# Patient Record
Sex: Female | Born: 1985 | Race: White | Hispanic: No | Marital: Married | Smoking: Current some day smoker
Health system: Southern US, Community
[De-identification: ages and names within clinical notes are randomized; demographics above are authoritative.]

## PROBLEM LIST (undated history)

## (undated) DIAGNOSIS — E063 Autoimmune thyroiditis: Secondary | ICD-10-CM

## (undated) DIAGNOSIS — M545 Low back pain, unspecified: Secondary | ICD-10-CM

## (undated) DIAGNOSIS — C539 Malignant neoplasm of cervix uteri, unspecified: Secondary | ICD-10-CM

## (undated) DIAGNOSIS — E039 Hypothyroidism, unspecified: Secondary | ICD-10-CM

## (undated) DIAGNOSIS — Z8541 Personal history of malignant neoplasm of cervix uteri: Secondary | ICD-10-CM

## (undated) DIAGNOSIS — F419 Anxiety disorder, unspecified: Secondary | ICD-10-CM

## (undated) DIAGNOSIS — R1115 Cyclical vomiting syndrome unrelated to migraine: Secondary | ICD-10-CM

## (undated) DIAGNOSIS — H9192 Unspecified hearing loss, left ear: Secondary | ICD-10-CM

## (undated) DIAGNOSIS — C801 Malignant (primary) neoplasm, unspecified: Secondary | ICD-10-CM

## (undated) HISTORY — DX: Personal history of malignant neoplasm of cervix uteri: Z85.41

## (undated) HISTORY — DX: Anxiety disorder, unspecified: F41.9

## (undated) HISTORY — DX: Autoimmune thyroiditis: E06.3

## (undated) HISTORY — PX: BUNIONECTOMY: SHX129

## (undated) HISTORY — DX: Hypothyroidism, unspecified: E03.9

## (undated) HISTORY — PX: TONSILLECTOMY: SUR1361

## (undated) HISTORY — DX: Low back pain, unspecified: M54.50

## (undated) HISTORY — DX: Unspecified hearing loss, left ear: H91.92

---
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
## 2007-10-15 ENCOUNTER — Emergency Department: Payer: Self-pay | Admitting: Emergency Medicine

## 2009-05-03 ENCOUNTER — Ambulatory Visit: Payer: Self-pay | Admitting: Family Medicine

## 2010-04-16 LAB — CBC AND DIFFERENTIAL
HEMATOCRIT: 41 % (ref 36–46)
Hemoglobin: 14.1 g/dL (ref 12.0–16.0)
NEUTROS ABS: 4 /uL
Platelets: 405 10*3/uL — AB (ref 150–399)
WBC: 9.2 10^3/mL

## 2010-04-16 LAB — TSH: TSH: 3.78 u[IU]/mL (ref 0.41–5.90)

## 2010-04-16 LAB — HEPATIC FUNCTION PANEL
ALT: 25 U/L (ref 7–35)
AST: 22 U/L (ref 13–35)
Alkaline Phosphatase: 76 U/L (ref 25–125)
Bilirubin, Total: 0.3 mg/dL

## 2010-04-16 LAB — BASIC METABOLIC PANEL
BUN: 12 mg/dL (ref 4–21)
Creatinine: 0.6 mg/dL (ref 0.5–1.1)
Glucose: 84 mg/dL
Potassium: 4.1 mmol/L (ref 3.4–5.3)
SODIUM: 137 mmol/L (ref 137–147)

## 2010-04-16 LAB — LIPID PANEL
Cholesterol: 200 mg/dL (ref 0–200)
HDL: 56 mg/dL (ref 35–70)
LDL Cholesterol: 110 mg/dL
LDl/HDL Ratio: 2
Triglycerides: 172 mg/dL — AB (ref 40–160)

## 2010-07-02 ENCOUNTER — Inpatient Hospital Stay: Payer: Self-pay | Admitting: Psychiatry

## 2010-11-05 ENCOUNTER — Ambulatory Visit: Payer: Self-pay | Admitting: Otolaryngology

## 2011-06-25 ENCOUNTER — Emergency Department: Payer: Self-pay | Admitting: Emergency Medicine

## 2011-06-25 LAB — URINALYSIS, COMPLETE
Bilirubin,UR: NEGATIVE
Glucose,UR: NEGATIVE mg/dL (ref 0–75)
Nitrite: NEGATIVE
Ph: 5 (ref 4.5–8.0)
Protein: 100
RBC,UR: 14 /HPF (ref 0–5)
Specific Gravity: 1.031 (ref 1.003–1.030)
Squamous Epithelial: 210

## 2011-06-25 LAB — CBC
HCT: 38.3 % (ref 35.0–47.0)
MCH: 32.4 pg (ref 26.0–34.0)
MCHC: 35 g/dL (ref 32.0–36.0)
MCV: 93 fL (ref 80–100)
Platelet: 332 10*3/uL (ref 150–440)
RBC: 4.14 10*6/uL (ref 3.80–5.20)
RDW: 12.4 % (ref 11.5–14.5)

## 2011-06-25 LAB — COMPREHENSIVE METABOLIC PANEL
Albumin: 3.2 g/dL — ABNORMAL LOW (ref 3.4–5.0)
Anion Gap: 11 (ref 7–16)
BUN: 5 mg/dL — ABNORMAL LOW (ref 7–18)
Bilirubin,Total: 0.3 mg/dL (ref 0.2–1.0)
Calcium, Total: 8.5 mg/dL (ref 8.5–10.1)
Chloride: 104 mmol/L (ref 98–107)
Co2: 21 mmol/L (ref 21–32)
EGFR (African American): >60
EGFR (Non-African Amer.): >60
Glucose: 87 mg/dL (ref 65–99)
Osmolality: 269 (ref 275–301)
Potassium: 3.6 mmol/L (ref 3.5–5.1)
SGOT(AST): 20 U/L (ref 15–37)
SGPT (ALT): 16 U/L
Sodium: 136 mmol/L (ref 136–145)
Total Protein: 7.4 g/dL (ref 6.4–8.2)

## 2011-06-25 LAB — ETHANOL: Ethanol %: <0.003 % (ref 0.000–0.080)

## 2011-06-25 LAB — TSH: Thyroid Stimulating Horm: 2.86 u[IU]/mL

## 2011-06-25 LAB — ACETAMINOPHEN LEVEL: Acetaminophen: <2 ug/mL

## 2011-06-25 LAB — DRUG SCREEN, URINE
Barbiturates, Ur Screen: NEGATIVE (ref ?–200)
Benzodiazepine, Ur Scrn: NEGATIVE (ref ?–200)
Cannabinoid 50 Ng, Ur ~~LOC~~: NEGATIVE (ref ?–50)
MDMA (Ecstasy)Ur Screen: NEGATIVE (ref ?–500)
Methadone, Ur Screen: NEGATIVE (ref ?–300)

## 2011-06-25 LAB — SALICYLATE LEVEL: Salicylates, Serum: 2.3 mg/dL

## 2011-09-07 ENCOUNTER — Inpatient Hospital Stay: Payer: Self-pay | Admitting: Obstetrics & Gynecology

## 2011-09-07 LAB — URINALYSIS, COMPLETE
Bacteria: NONE SEEN
Glucose,UR: NEGATIVE mg/dL (ref 0–75)
Nitrite: NEGATIVE
Ph: 6 (ref 4.5–8.0)
Protein: >=500
RBC,UR: 2 /HPF (ref 0–5)
Specific Gravity: 1.014 (ref 1.003–1.030)
Squamous Epithelial: 5

## 2011-09-07 LAB — COMPREHENSIVE METABOLIC PANEL
BUN: 10 mg/dL (ref 7–18)
Calcium, Total: 8.3 mg/dL — ABNORMAL LOW (ref 8.5–10.1)
Chloride: 108 mmol/L — ABNORMAL HIGH (ref 98–107)
Co2: 22 mmol/L (ref 21–32)
Creatinine: 0.66 mg/dL (ref 0.60–1.30)
EGFR (Non-African Amer.): >60
Glucose: 78 mg/dL (ref 65–99)
SGPT (ALT): 13 U/L
Sodium: 140 mmol/L (ref 136–145)
Total Protein: 6.4 g/dL (ref 6.4–8.2)

## 2011-09-07 LAB — DRUG SCREEN, URINE
Amphetamines, Ur Screen: NEGATIVE (ref ?–1000)
MDMA (Ecstasy)Ur Screen: NEGATIVE (ref ?–500)
Methadone, Ur Screen: NEGATIVE (ref ?–300)
Opiate, Ur Screen: NEGATIVE (ref ?–300)
Phencyclidine (PCP) Ur S: NEGATIVE (ref ?–25)

## 2011-09-07 LAB — CBC
HCT: 36.8 % (ref 35.0–47.0)
HGB: 12.6 g/dL (ref 12.0–16.0)
MCH: 31.5 pg (ref 26.0–34.0)
MCHC: 34.2 g/dL (ref 32.0–36.0)
MCV: 92 fL (ref 80–100)
Platelet: 199 10*3/uL (ref 150–440)
WBC: 11.3 10*3/uL — ABNORMAL HIGH (ref 3.6–11.0)

## 2011-09-07 LAB — HCG, QUANTITATIVE, PREGNANCY: Beta Hcg, Quant.: 7071 m[IU]/mL — ABNORMAL HIGH

## 2011-09-09 LAB — CBC
HCT: 32.9 % — ABNORMAL LOW (ref 35.0–47.0)
MCH: 32.5 pg (ref 26.0–34.0)
MCV: 94 fL (ref 80–100)
RBC: 3.49 10*6/uL — ABNORMAL LOW (ref 3.80–5.20)
RDW: 13.2 % (ref 11.5–14.5)
WBC: 8.4 10*3/uL (ref 3.6–11.0)

## 2011-09-09 LAB — HEMATOCRIT: HCT: 33 % — ABNORMAL LOW (ref 35.0–47.0)

## 2011-09-10 LAB — PATHOLOGY REPORT

## 2011-09-11 ENCOUNTER — Emergency Department: Payer: Self-pay | Admitting: Unknown Physician Specialty

## 2011-09-11 LAB — COMPREHENSIVE METABOLIC PANEL
Albumin: 2.8 g/dL — ABNORMAL LOW (ref 3.4–5.0)
Alkaline Phosphatase: 101 U/L (ref 50–136)
Anion Gap: 11 (ref 7–16)
BUN: 10 mg/dL (ref 7–18)
Bilirubin,Total: 0.3 mg/dL (ref 0.2–1.0)
Calcium, Total: 9.3 mg/dL (ref 8.5–10.1)
Chloride: 109 mmol/L — ABNORMAL HIGH (ref 98–107)
Creatinine: 0.67 mg/dL (ref 0.60–1.30)
EGFR (African American): >60
EGFR (Non-African Amer.): >60
Osmolality: 281 (ref 275–301)
Potassium: 4.2 mmol/L (ref 3.5–5.1)
SGPT (ALT): 20 U/L
Sodium: 142 mmol/L (ref 136–145)
Total Protein: 6.9 g/dL (ref 6.4–8.2)

## 2011-09-11 LAB — URINALYSIS, COMPLETE
Bacteria: NONE SEEN
Blood: NEGATIVE
Glucose,UR: NEGATIVE mg/dL (ref 0–75)
Ketone: NEGATIVE
Leukocyte Esterase: NEGATIVE
Nitrite: NEGATIVE
RBC,UR: 1 /HPF (ref 0–5)
Specific Gravity: 1.006 (ref 1.003–1.030)

## 2011-09-11 LAB — CBC
HCT: 34.9 % — ABNORMAL LOW (ref 35.0–47.0)
MCH: 31.9 pg (ref 26.0–34.0)
MCHC: 34.3 g/dL (ref 32.0–36.0)
MCV: 93 fL (ref 80–100)
Platelet: 265 10*3/uL (ref 150–440)
RBC: 3.76 10*6/uL — ABNORMAL LOW (ref 3.80–5.20)
RDW: 13.2 % (ref 11.5–14.5)
WBC: 9.2 10*3/uL (ref 3.6–11.0)

## 2012-03-25 ENCOUNTER — Ambulatory Visit: Payer: Self-pay | Admitting: Family Medicine

## 2012-08-03 ENCOUNTER — Ambulatory Visit: Payer: Self-pay | Admitting: Podiatry

## 2012-08-10 ENCOUNTER — Ambulatory Visit: Payer: Self-pay | Admitting: Podiatry

## 2014-07-29 NOTE — Consult Note (Signed)
PATIENT NAME:  Kristy Gordon, COBB MR#:  [id number] DATE OF BIRTH:  05-17-1985  DATE OF CONSULTATION:  06/26/2011  REFERRING PHYSICIAN:   CONSULTING PHYSICIAN:  Gonzella Lex, MD  IDENTIFYING INFORMATION AND REASON FOR CONSULT: This is a 29 year old woman who was brought to the Emergency Room under involuntary commitment because of a suicidal threat. The consult was to evaluate dangerousness and appropriate treatment.   CHIEF COMPLAINT: "This is all a big mistake. I was just upset."   HISTORY OF PRESENT ILLNESS:  She admits that yesterday she was having an argument with her boyfriend via text messages. She says that her boyfriend made a statement that she should just ignore him and forget that he exists. She responded to that by making a statement that he did not need to worry about her and suggesting that she might be planning to kill herself. Boyfriend took out paperwork, I believe, or else called the mother who did so. The patient reports that recent symptoms have been increased moodiness and emotional lability. This has been present since she has been pregnant for the last couple of months.  It gets worse, of course, when she is having arguments with her boyfriend. She denies that she has had any suicidal thoughts recently. She denies that she was actually having any thought of doing anything to hurt herself yesterday. She has been off psychiatric medicines since learning that she was pregnant. Otherwise, she says that recently she has not been feeling particularly depressed or down. Denies suicidal ideation. Says she sleeps pretty well even off of medicine. Energy level is pretty good. She has continued to work and take care of herself. She feels optimistic and is looking forward to having this child. Feels like it very much worth living for her. Has plans for the future. Not having a lot of negative thinking. Denies any psychotic symptoms. She is not currently using any substances of abuse. She has not  been seeing her outpatient therapist recently because of finances.   PAST PSYCHIATRIC HISTORY:   One previous hospitalization at our facility about a year ago. At that time she had taken an overdose of an antidepressant. Even at that time she was claiming that she was just trying to sleep. It was not an actually dangerous overdose. At that time she was diagnosed with some degree of chronic depression and PTSD. She was treated with low dose of Seroquel at that time. She says that she continued that for a while, but later was switched to trazodone for sleep. She also had been seeing a therapist in the community over the last year several times and had been referred to someone for specific PTSD treatment. She has seen them a couple of times but recently has not followed up again because of finances. She has no other history of suicide attempts. She does have a history of drinking in the past but has stopped drinking, especially since learning that she is pregnant. No other history of suicide attempts. No history of psychotic symptoms.   SOCIAL HISTORY: The patient lives with her parents. She works Theme park manager.  She said she really likes her job. She is three months pregnant. Never been pregnant before and never had any children before. Has a boyfriend with whom she says she usually has a pretty good relationship but they do often get in arguments because he lives so far away from her.   SUBSTANCE ABUSE HISTORY: Some history of drinking in the past, no clear substance abuse  diagnosis. Currently not drinking or using any drugs.   MEDICAL HISTORY: Currently three months pregnant. Has seen an OB/GYN at [facility] obstetrics. Says that she thinks the pregnancy is going pretty well but she has been more moody since getting pregnant.   REVIEW OF SYSTEMS: Complains of increased moodiness. Denies sleep problems, denies appetite problems. Currently denies any suicidal ideation. Denies any psychotic symptoms.  Endorses some mild fatigue, but not enough to restrict her activities. Sleeping pretty well.  No new constitutional complaints. No other specific medical complaints.   MENTAL STATUS EXAM: The patient was interviewed in the Emergency Room. She was awake and cooperative during the interview. Eye contact was good. Psychomotor activity normal. Speech normal rate, tone, and volume. Affect was reactive, appropriate, and upbeat. Mood was stated as being fine. Thoughts are lucid and directed with no evidence of loosening of associations or delusional thinking. No sign of hostility or paranoid thinking. Denies suicidal or homicidal ideation. The patient is alert and oriented and appears to be grossly cognitively intact. Judgment and insight are adequate.   ASSESSMENT: This is a 29 year old woman who made a suicidal threat but did not actually try to harm herself. It was in the middle of an argument. She is not describing currently having a major depression. She does describe being more moody since being pregnant. She does have a past history of mood swings, irritability, and anxiety with a diagnosis of PTSD. She is showing good insight. She is able to state multiple things that are positive in her life that she wants to live for. Not feeling hopeless or overwhelmed. At this point I do not think that the patient is acutely dangerous to herself. I do think that it would be appropriate for her to continue to follow up with her therapist and I have advised her to do so. I also think it would be a good idea for her to talk with her OB/GYN  as a good first contact about her moodiness.   TREATMENT PLAN: I will discontinue the involuntary commitment paperwork. Discussed the case with the Emergency Room doctor and recommend the patient be discharged from the Emergency Room. I attempted to call the family and there was no reply but there was voicemail. I left them a message stating that she was being released from the Emergency  Room. I have educated the patient about depression symptoms and the importance of getting follow-up treatment. Educated her about the importance of immediately getting help if suicidal ideation were to arise. I suggested that she follow up with her therapist and with her OB/GYN. She is completely agreeable to all of this. Not starting any medication although we have discussed the potential that that might be useful in the future.   DIAGNOSIS PRINCIPLE AND PRIMARY:  AXIS I: Adjustment disorder with mood symptoms.   SECONDARY DIAGNOSES:  AXIS I: Posttraumatic stress disorder.   AXIS II: No diagnosis.   AXIS III: Intrauterine pregnancy, three months, normal.   AXIS IV: Stress from pregnancy, being unmarried, having to continue working.   AXIS V: Functioning at time of evaluation: 28.   ____________________________ Gonzella Lex, MD jtc:bjt D: 06/26/2011 10:25:25 ET T: 06/26/2011 10:43:05 ET JOB#: [id number]  cc: Gonzella Lex, MD, <Dictator> Gonzella Lex MD ELECTRONICALLY SIGNED 06/26/2011 17:51

## 2014-07-29 NOTE — Consult Note (Signed)
Brief Consult Note: Diagnosis: adjustment disorder.   Patient was seen by consultant.   Consult note dictated.   Discussed with Attending MD.   Comments: Psychiatry: Patient seen. Patient made a suicidal statement yesterday in a text to her BF. They were in an arguement. Pt didnot actually do anything to harm self. Today calm and appropriate. Denies any SI. Denies MDE symptoms. Is pregnant and has been more emotional since being pregnant. She is not acutely dangerous and is able to discuss an appropriate plan for outpt followup. Will cancel IVC. Discussed with ER MD. Advise pt may be dischargeed from ER.  Electronic Signatures: Itali Mckendry, Madie Reno (MD)  (Signed 22-Mar-13 10:16)  Authored: Brief Consult Note   Last Updated: 22-Mar-13 10:16 by Gonzella Lex (MD)

## 2014-08-14 NOTE — H&P (Signed)
L&D Evaluation:  History:   HPI 29 year old G2P0100 presents to ER this AM with c/o spotting, cramping, and having high blood pressure over the weekend (her sister is a Marine scientist and took her BP). She has not had PNC so far this pregnancy, she was a pt of [facility] in the past but not recently. She claims that her approx LMP was 04/16/11 which gives her a EDD of 01/21/12 and would make her 20 weeks 4 days. Pt states she has had irreg cycles and only found out she was pregnant a few weeks ago by home UPT. Ob hx of one previous SVD in 2008 of a stillborn at "7 months" per pt. She said she was cramping, started to feel pressure and went to hospital and "baby came fast" and at that time was found to have preeclampsia.  Her BP was treated in ER and she was brought to mother baby for BP control (we were originally told she was 18 weeks per ER), when unable to get FHT's per doppler a bedside U/S was done per Dr. Cristino Martes and there was no fetal cardiac activity. Pt was appropriately upset with this news.- She states "I felt like something was wrong" She has been having severe headaches for several days, the spotting started yesterday and continued throught this morning, the cramping has been for the last couple days. Dr. Cristino Martes measured femur length and estimated the fetus to be between 24-25 weeks.    Presents with abdominal pain, vaginal bleeding, high blood pressure    Patient's Medical History Hypertension  preeclampsia with previous pregnancy    Patient's Surgical History none    Medications Pre Natal Vitamins    Allergies PCN    Social History drugs  previous MJ and alcohol use- states 5 years ago    Family History Non-Contributory   ROS:   ROS see HPI   Exam:   Vital Signs BP >140/90    Urine Protein + protein per ER dipstick    General no apparent distress    Mental Status clear  appropriately saddened    Chest clear    Abdomen gravid, non-tender    Back no CVAT    Edema no edema     Reflexes 2+    Clonus negative    Pelvic fingertip/thick    Mebranes Intact    FHT no FHR activity    Ucx "cramping"    Skin dry   Impression:   Impression IUFD at approx 24-25 weeks, chronic HTN   Plan:   Plan Pitocin for induction, IV latetolol for BP's > 160/90, UDS, Prenatal labs   Electronic Signatures: Shann Medal (CNM)  (Signed 03-Jun-13 16:48)  Authored: L&D Evaluation   Last Updated: 03-Jun-13 16:48 by Shann Medal (CNM)

## 2015-09-05 DIAGNOSIS — F329 Major depressive disorder, single episode, unspecified: Secondary | ICD-10-CM | POA: Insufficient documentation

## 2015-09-05 DIAGNOSIS — F32A Depression, unspecified: Secondary | ICD-10-CM | POA: Insufficient documentation

## 2015-09-05 DIAGNOSIS — G4700 Insomnia, unspecified: Secondary | ICD-10-CM | POA: Insufficient documentation

## 2015-09-05 DIAGNOSIS — F419 Anxiety disorder, unspecified: Secondary | ICD-10-CM | POA: Insufficient documentation

## 2015-09-05 DIAGNOSIS — E079 Disorder of thyroid, unspecified: Secondary | ICD-10-CM | POA: Insufficient documentation

## 2015-09-05 DIAGNOSIS — Z720 Tobacco use: Secondary | ICD-10-CM

## 2015-09-05 DIAGNOSIS — E669 Obesity, unspecified: Secondary | ICD-10-CM | POA: Insufficient documentation

## 2015-09-05 DIAGNOSIS — E010 Iodine-deficiency related diffuse (endemic) goiter: Secondary | ICD-10-CM | POA: Insufficient documentation

## 2015-09-05 DIAGNOSIS — F1111 Opioid abuse, in remission: Secondary | ICD-10-CM | POA: Insufficient documentation

## 2015-09-05 DIAGNOSIS — F17200 Nicotine dependence, unspecified, uncomplicated: Secondary | ICD-10-CM | POA: Insufficient documentation

## 2015-09-09 ENCOUNTER — Ambulatory Visit (INDEPENDENT_AMBULATORY_CARE_PROVIDER_SITE_OTHER): Payer: Self-pay | Admitting: Family Medicine

## 2015-09-09 ENCOUNTER — Encounter: Payer: Self-pay | Admitting: Family Medicine

## 2015-09-09 VITALS — BP 138/90 | HR 82 | Temp 98.0°F | Resp 16 | Wt 282.0 lb

## 2015-09-09 DIAGNOSIS — E049 Nontoxic goiter, unspecified: Secondary | ICD-10-CM

## 2015-09-09 DIAGNOSIS — Z8639 Personal history of other endocrine, nutritional and metabolic disease: Secondary | ICD-10-CM

## 2015-09-09 DIAGNOSIS — E039 Hypothyroidism, unspecified: Secondary | ICD-10-CM

## 2015-09-09 DIAGNOSIS — E669 Obesity, unspecified: Secondary | ICD-10-CM

## 2015-09-09 DIAGNOSIS — R5383 Other fatigue: Secondary | ICD-10-CM

## 2015-09-09 DIAGNOSIS — L659 Nonscarring hair loss, unspecified: Secondary | ICD-10-CM

## 2015-09-09 NOTE — Progress Notes (Signed)
Patient ID: Kristy Gordon, female   DOB: May 09, 1985, 30 y.o.   MRN: [id number]    Subjective:  HPI  Patient is here to get a referral to a specialist for her thyroid. Symptoms she is having are fatigue, hair loss, swollen thyroid, loosing voice, anxiety, insomnia, dry skin and decreased appetite. Patient states this has been an issue for years and has gotten worse lately. She saw a specialist before years ago and needed to follow up in 6 months from first visit but she did not do this. Symptoms were similar to the ones she is having now just not as severe.  Prior to Admission medications   Not on File    Patient Active Problem List   Diagnosis Date Noted  . Anxiety 09/05/2015  . Clinical depression 09/05/2015  . Big thyroid 09/05/2015  . Cannot sleep 09/05/2015  . Adiposity 09/05/2015  . 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 Current tobacco use 09/05/2015    No past medical history on file.  Social History   Social History  . Marital Status: Single    Spouse Name: N/A  . Number of Children: N/A  . Years of Education: N/A   Occupational History  . Not on file.   Social History Main Topics  . Smoking status: Current Every Day Smoker -- 1.00 packs/day for 5 years    Types: Cigarettes  . Smokeless tobacco: Never Used  . Alcohol Use: Yes     Comment: rare  . Drug Use: No  . Sexual Activity: Yes    Birth Control/ Protection: None   Other Topics Concern  . Not on file   Social History Narrative    Allergies  Allergen Reactions  . Penicillins   . Amoxicillin Rash    Review of Systems  Constitutional: Positive for malaise/fatigue. Negative for fever and chills.  Eyes: Negative.   Respiratory: Negative.   Cardiovascular: Positive for chest pain (rarely but will get a sharp/stabbing pain for a few seconds at times).  Gastrointestinal: Negative for heartburn, nausea and abdominal pain.  Musculoskeletal: Positive for  neck pain. Negative for back pain and joint pain.  Skin:       Dry skin  Neurological: Negative for dizziness.       Some issues with concentration  Endo/Heme/Allergies: Negative.   Psychiatric/Behavioral: Negative for depression. The patient is nervous/anxious and has insomnia.     Immunization History  Administered Date(s) Administered  . Td 02/21/2001   Objective:  BP 138/90 mmHg  Pulse 82  Temp(Src) 98 F (36.7 C)  Resp 16  Wt 282 lb (127.914 kg)  LMP 09/01/2015  Physical Exam  Constitutional: She is oriented to person, place, and time and well-developed, well-nourished, and in no distress.  HENT:  Head: Normocephalic and atraumatic.  Right Ear: External ear normal.  Left Ear: External ear normal.  Nose: Nose normal.  Eyes: Conjunctivae are normal. Pupils are equal, round, and reactive to light.  Neck: Neck supple.  Goiter slightly enlarged on the right then the left side.  Cardiovascular: Normal rate, regular rhythm, normal heart sounds and intact distal pulses.   Pulmonary/Chest: Effort normal and breath sounds normal. No respiratory distress. She has no wheezes.  Abdominal: Soft.  Musculoskeletal: She exhibits no edema or tenderness.  Neurological: She is alert and oriented to person, place, and time.  Skin: Skin is warm and dry.  Psychiatric: Mood, memory, affect and judgment normal.    Lab  Results  Component Value Date   WBC 9.2 09/11/2011   HGB 12.0 09/11/2011   HCT 34.9* 09/11/2011   PLT 265 09/11/2011   GLUCOSE 83 09/11/2011   CHOL 200 04/16/2010   TRIG 172* 04/16/2010   HDL 56 04/16/2010   LDLCALC 110 04/16/2010   TSH 2.86 06/25/2011    CMP     Component Value Date/Time   NA 142 09/11/2011 1701   NA 137 04/16/2010   K 4.2 09/11/2011 1701   K 4.1 04/16/2010   CL 109* 09/11/2011 1701   CO2 22 09/11/2011 1701   GLUCOSE 83 09/11/2011 1701   BUN 10 09/11/2011 1701   BUN 12 04/16/2010   CREATININE 0.67 09/11/2011 1701   CREATININE 0.6  04/16/2010   CALCIUM 9.3 09/11/2011 1701   PROT 6.9 09/11/2011 1701   ALBUMIN 2.8* 09/11/2011 1701   AST 28 09/11/2011 1701   AST 22 04/16/2010   ALT 20 09/11/2011 1701   ALT 25 04/16/2010   ALKPHOS 101 09/11/2011 1701   ALKPHOS 76 04/16/2010   BILITOT 0.3 09/11/2011 1701   GFRNONAA >60 09/11/2011 1701   GFRNONAA >60 06/25/2011 1936   GFRAA >60 09/11/2011 1701   GFRAA >60 06/25/2011 1936    Assessment and Plan :  1. Alopecia Will check labs. May need Rogaine for women and dermatology referral. 2. Other fatigue  3. Adiposity 4. History of hypothyroidism Will check labs, patient was referred to endocrinologist before but was told she was hypothyroid and if this is still the case we can manage it for the patient and patient is in agreement with the plan. May need to refer in the future.  5. Goiter Advised patient this can be managed but there is no way to fix it. If she starts to have issues with been uncomfortable with this issue or swallowing issues then will need to refer for further treatment at that time.  Patient was seen and examined by Dr. Eulas Post and note was scribed by Theressa Millard, RMA.   Miguel Aschoff MD [facility] 09/09/2015 4:28 PM

## 2015-09-12 ENCOUNTER — Telehealth: Payer: Self-pay | Admitting: Family Medicine

## 2015-09-12 LAB — CBC WITH DIFFERENTIAL/PLATELET
BASOS: 1 %
Basophils Absolute: 0.1 10*3/uL (ref 0.0–0.2)
EOS (ABSOLUTE): 0.1 10*3/uL (ref 0.0–0.4)
EOS: 1 %
HEMATOCRIT: 42.8 % (ref 34.0–46.6)
Hemoglobin: 14.7 g/dL (ref 11.1–15.9)
Immature Grans (Abs): 0 10*3/uL (ref 0.0–0.1)
Immature Granulocytes: 0 %
LYMPHS ABS: 4.7 10*3/uL — AB (ref 0.7–3.1)
Lymphs: 39 %
MCH: 31.5 pg (ref 26.6–33.0)
MCHC: 34.3 g/dL (ref 31.5–35.7)
MCV: 92 fL (ref 79–97)
MONOS ABS: 0.9 10*3/uL (ref 0.1–0.9)
Monocytes: 8 %
NEUTROS ABS: 6.2 10*3/uL (ref 1.4–7.0)
Neutrophils: 51 %
Platelets: 462 10*3/uL — ABNORMAL HIGH (ref 150–379)
RBC: 4.66 x10E6/uL (ref 3.77–5.28)
RDW: 13.6 % (ref 12.3–15.4)
WBC: 11.9 10*3/uL — AB (ref 3.4–10.8)

## 2015-09-12 LAB — COMPREHENSIVE METABOLIC PANEL
A/G RATIO: 1.4 (ref 1.2–2.2)
ALT: 24 IU/L (ref 0–32)
AST: 21 IU/L (ref 0–40)
Albumin: 4.8 g/dL (ref 3.5–5.5)
Alkaline Phosphatase: 75 IU/L (ref 39–117)
BUN / CREAT RATIO: 13 (ref 9–23)
BUN: 11 mg/dL (ref 6–20)
Bilirubin Total: 0.5 mg/dL (ref 0.0–1.2)
CO2: 17 mmol/L — ABNORMAL LOW (ref 18–29)
Calcium: 9.7 mg/dL (ref 8.7–10.2)
Chloride: 100 mmol/L (ref 96–106)
Creatinine, Ser: 0.82 mg/dL (ref 0.57–1.00)
GFR calc Af Amer: 112 mL/min/{1.73_m2} (ref >59)
GFR, EST NON AFRICAN AMERICAN: 97 mL/min/{1.73_m2} (ref >59)
GLOBULIN, TOTAL: 3.4 g/dL (ref 1.5–4.5)
Glucose: 102 mg/dL — ABNORMAL HIGH (ref 65–99)
POTASSIUM: 4.2 mmol/L (ref 3.5–5.2)
SODIUM: 138 mmol/L (ref 134–144)
Total Protein: 8.2 g/dL (ref 6.0–8.5)

## 2015-09-12 LAB — TSH: TSH: 9.98 u[IU]/mL — AB (ref 0.450–4.500)

## 2015-09-12 MED ORDER — LEVOTHYROXINE SODIUM 50 MCG PO TABS: 50.0000 ug | ORAL_TABLET | Freq: Every day | ORAL | Status: DC

## 2015-09-12 NOTE — Telephone Encounter (Signed)
error 

## 2015-10-02 ENCOUNTER — Encounter: Payer: Self-pay | Admitting: Family Medicine

## 2015-10-02 ENCOUNTER — Ambulatory Visit (INDEPENDENT_AMBULATORY_CARE_PROVIDER_SITE_OTHER): Payer: Self-pay | Admitting: Family Medicine

## 2015-10-02 VITALS — BP 122/78 | HR 72 | Temp 98.8°F | Resp 16 | Wt 282.0 lb

## 2015-10-02 DIAGNOSIS — E049 Nontoxic goiter, unspecified: Secondary | ICD-10-CM

## 2015-10-02 NOTE — Progress Notes (Signed)
       Patient: Kristy Gordon Female    DOB: 1986-02-23   30 y.o.   MRN: [id number] Visit Date: 10/02/2015  Today's Provider: Wilhemena Durie, MD   Chief Complaint  Patient presents with  . Goiter   Subjective:    HPI  Patient comes in today to discuss her goiter. She reports that it is becoming more difficult to talk and swallow. Patient reports that she is less fatigued since starting levothyroxine. However, she wants to be referred to a specialist to see if her goiter needs to be removed. She is having occasional feeling of difficulty swallowing and occasional hoarseness without any significant allergy or reflux symptoms.     Allergies  Allergen Reactions  . Penicillins   . Amoxicillin Rash   Current Meds  Medication Sig  . levothyroxine (SYNTHROID, LEVOTHROID) 50 MCG tablet Take 1 tablet (50 mcg total) by mouth daily.    Review of Systems  Constitutional: Positive for fatigue.  HENT: Positive for sore throat, trouble swallowing and voice change.   Respiratory: Positive for shortness of breath.        Shortness of breath comes and goes.   Endocrine: Negative.   Musculoskeletal: Positive for neck pain.       Patient describes it as tenderness.   Skin:       She has noticed some hair loss.  Neurological: Negative.   Psychiatric/Behavioral: Negative.     Social History  Substance Use Topics  . Smoking status: Current Every Day Smoker -- 1.00 packs/day for 5 years    Types: Cigarettes  . Smokeless tobacco: Never Used  . Alcohol Use: Yes     Comment: rare   Objective:   BP 122/78 mmHg  Pulse 72  Temp(Src) 98.8 F (37.1 C)  Resp 16  Wt 282 lb (127.914 kg)  LMP 09/01/2015  Physical Exam  Constitutional: She appears well-developed and well-nourished.  HENT:  Head: Normocephalic and atraumatic.  Right Ear: External ear normal.  Left Ear: External ear normal.  Nose: Nose normal.  Neck: Normal range of motion. Thyromegaly present.  She has what appears to  be a large diffuse goiter. No obvious nodules. This seems to be getting larger.  Cardiovascular: Normal rate, regular rhythm and normal heart sounds.   Pulmonary/Chest: Effort normal and breath sounds normal.  Abdominal: Soft.        Assessment & Plan:     1. Goiter  I want to make sure we are spot on with his diagnosis. Patient now appears to be having symptoms related to just the size of the goiter. Requests expert opinion from endocrinology. TSH 3 weeks ago was 9.98 and the patient states she feels a little bit better with increased dose of Synthroid - Ambulatory referral to Endocrinology 2. Depression Presently controlled/in remission. Patient is a newlywed and is very happily married. She has had a rough few years as a couple years ago her brother committed suicide in her father died of pancreatic cancer that same year at 29 years old.      Kristy Gordon Mon, MD  [facility]

## 2016-07-24 ENCOUNTER — Telehealth: Payer: Self-pay | Admitting: Family Medicine

## 2016-07-24 NOTE — Telephone Encounter (Signed)
Pt called saying Dr. Rosanna Randy had referred her to [facility] for thyroid care.  Pt wants to be referred to [facility] now.  She is wanting a second opinion.  Pt's call back [phone]  Thank sTeri

## 2016-07-24 NOTE — Telephone Encounter (Signed)
Ok to do?-aa

## 2016-07-28 ENCOUNTER — Other Ambulatory Visit: Payer: Self-pay

## 2016-07-28 DIAGNOSIS — E010 Iodine-deficiency related diffuse (endemic) goiter: Secondary | ICD-10-CM

## 2016-07-28 DIAGNOSIS — E079 Disorder of thyroid, unspecified: Secondary | ICD-10-CM

## 2016-07-28 NOTE — Telephone Encounter (Signed)
Advised and order placed ED

## 2016-07-28 NOTE — Telephone Encounter (Signed)
Ok to [employer] or to [employer] or [employer]  at pt request.

## 2016-12-01 ENCOUNTER — Telehealth: Payer: Self-pay

## 2016-12-01 NOTE — Telephone Encounter (Signed)
Pt contacted after hours triage service stating that she is [redacted] weeks pregnant and feels like she might be having a miscarriage. Having cramps and bleeding for four days with huge clots starting at 6 pm and cramps were bad until passing clots.   Patient was advised by on call nurse to go to ED. Pt did not go to ED. Contacted pt this morning and was informed by her partner that she was feeling better now and bleeding stopped. Advised that patient urgently needs appt ASAP. She did not have a NOB already scheduled.   Pt plans to call to schedule.

## 2016-12-30 ENCOUNTER — Ambulatory Visit (INDEPENDENT_AMBULATORY_CARE_PROVIDER_SITE_OTHER): Payer: Self-pay | Admitting: Family Medicine

## 2016-12-30 ENCOUNTER — Encounter: Payer: Self-pay | Admitting: Family Medicine

## 2016-12-30 VITALS — BP 108/82 | HR 100 | Temp 98.8°F | Resp 16 | Wt 285.6 lb

## 2016-12-30 DIAGNOSIS — F419 Anxiety disorder, unspecified: Secondary | ICD-10-CM

## 2016-12-30 DIAGNOSIS — G4709 Other insomnia: Secondary | ICD-10-CM

## 2016-12-30 DIAGNOSIS — E038 Other specified hypothyroidism: Secondary | ICD-10-CM

## 2016-12-30 DIAGNOSIS — Z6841 Body Mass Index (BMI) 40.0 and over, adult: Secondary | ICD-10-CM

## 2016-12-30 DIAGNOSIS — Z2821 Immunization not carried out because of patient refusal: Secondary | ICD-10-CM

## 2016-12-30 DIAGNOSIS — Z720 Tobacco use: Secondary | ICD-10-CM

## 2016-12-30 DIAGNOSIS — M25561 Pain in right knee: Secondary | ICD-10-CM

## 2016-12-30 DIAGNOSIS — R5383 Other fatigue: Secondary | ICD-10-CM

## 2016-12-30 MED ORDER — LEVOTHYROXINE SODIUM 175 MCG PO TABS: 175.0000 ug | ORAL_TABLET | Freq: Every day | ORAL | 11 refills | Status: DC

## 2016-12-30 MED ORDER — NAPROXEN 500 MG PO TABS: 500.0000 mg | ORAL_TABLET | Freq: Two times a day (BID) | ORAL | 5 refills | Status: DC

## 2016-12-30 MED ORDER — CLONAZEPAM 0.5 MG PO TABS: 0.5000 mg | ORAL_TABLET | Freq: Three times a day (TID) | ORAL | 2 refills | Status: DC | PRN

## 2016-12-30 NOTE — Patient Instructions (Addendum)
Please get a copy of your Tetanus immunization to [place] to update your record. Thank you.   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  Smoking Tobacco Information Smoking tobacco will very likely harm your health. Tobacco contains a poisonous (toxic), colorless chemical called nicotine. Nicotine affects the brain and makes tobacco addictive. This change in your brain can make it hard to stop smoking. Tobacco also has other toxic chemicals that can hurt your body and raise your risk of many cancers. How can smoking tobacco affect me? Smoking tobacco can increase your chances of having serious health conditions, such as:  Cancer. Smoking is most commonly associated with lung cancer, but can lead to cancer in other parts of the body.  Chronic obstructive pulmonary disease (COPD). This is a long-term lung condition that makes it hard to breathe. It also gets worse over time.  High blood pressure (hypertension), heart disease, stroke, or heart attack.  Lung infections, such as pneumonia.  Cataracts. This is when the lenses in the eyes become clouded.  Digestive problems. This may include peptic ulcers, heartburn, and gastroesophageal reflux disease (GERD).  Oral health problems, such as gum disease and tooth loss.  Loss of taste and smell.  Smoking can affect your appearance by causing:  Wrinkles.  Yellow or stained teeth, fingers, and fingernails.  Smoking tobacco can also affect your social life.  Many workplaces, [employer], hotels, and public places are tobacco-free. This means that you may experience challenges in finding places to smoke when away from home.  The cost of a smoking habit can be expensive. Expenses for someone who smokes come in two ways: ? You spend money on a regular basis to buy tobacco. ? Your health care costs in the long-term are higher if you smoke.  Tobacco smoke can also affect the health of those around you. Children  of smokers have greater chances of: ? Sudden infant death syndrome (SIDS). ? Ear infections. ? Lung infections.  What lifestyle changes can be made?  Do not start smoking. Quit if you already do.  To quit smoking: ? Make a plan to quit smoking and commit yourself to it. Look for programs to help you and ask your health care provider for recommendations and ideas. ? Talk with your health care provider about using nicotine replacement medicines to help you quit. Medicine replacement medicines include gum, lozenges, patches, sprays, or pills. ? Do not replace cigarette smoking with electronic cigarettes, which are commonly called e-cigarettes. The safety of e-cigarettes is not known, and some may contain harmful chemicals. ? Avoid places, people, or situations that tempt you to smoke. ? If you try to quit but return to smoking, don't give up hope. It is very common for people to try a number of times before they fully succeed. When you feel ready again, give it another try.  Quitting smoking might affect the way you eat as well as your weight. Be prepared to monitor your eating habits. Get support in planning and following a healthy diet.  Ask your health care provider about having regular tests (screenings) to check for cancer. This may include blood tests, imaging tests, and other tests.  Exercise regularly. Consider taking walks, joining a gym, or doing yoga or exercise classes.  Develop skills to manage your stress. These skills include meditation. What are the benefits of quitting smoking? By quitting smoking, you may:  Lower your risk of getting cancer and other diseases caused by smoking.  Live longer.  Breathe better.  Lower your blood pressure and heart rate.  Stop your addiction to tobacco.  Stop creating secondhand smoke that hurts other people.  Improve your sense of taste and smell.  Look better over time, due to having fewer wrinkles and less staining.  What can  happen if changes are not made? If you do not stop smoking, you may:  Get cancer and other diseases.  Develop COPD or other long-term (chronic) lung conditions.  Develop serious problems with your heart and blood vessels (cardiovascular system).  Need more tests to screen for problems caused by smoking.  Have higher, long-term healthcare costs from medicines or treatments related to smoking.  Continue to have worsening changes in your lungs, mouth, and nose.  Where to find support: To get support to quit smoking, consider:  Asking your health care provider for more information and resources.  Taking classes to learn more about quitting smoking.  Looking for local organizations that offer resources about quitting smoking.  Joining a support group for people who want to quit smoking in your local community.  Where to find more information: You may find more information about quitting smoking from:  HelpGuide.org: www.helpguide.org/articles/addictions/how-to-quit-smoking.htm  https://hall.com/: smokefree.gov  American Lung Association: www.lung.org  Contact a health care provider if:  You have problems breathing.  Your lips, nose, or fingers turn blue.  You have chest pain.  You are coughing up blood.  You feel faint or you pass out.  You have other noticeable changes that cause you to worry. Summary  Smoking tobacco can negatively affect your health, the health of those around you, your finances, and your social life.  Do not start smoking. Quit if you already do. If you need help quitting, ask your health care provider.  Think about joining a support group for people who want to quit smoking in your local community. There are many effective programs that will help you to quit this behavior. This information is not intended to replace advice given to you by your health care provider. Make sure you discuss any questions you have with your health care  provider. Document Released: 04/07/2016 Document Revised: 04/07/2016 Document Reviewed: 04/07/2016 Elsevier Interactive Patient Education  [employer].

## 2016-12-30 NOTE — Progress Notes (Signed)
Kristy Gordon  MRN: [id number] DOB: [birth year]  Subjective:  HPI  Patient is here to discuss a few things. Last office visit was 09/09/15.  Hypothyroidism: patient has been seen Dr Maretta Bees for her TSH. And last time she saw her was in May 2018. Last level was checked with them on 07/13/16 and it was 15.276. Has not been re checked. Patient asked to get transferred to another doctor there but has not heard back. She wanted to follow up on this with [place].  Right knee pain: Started when she woke up Monday 12/28/16. No injury/trauma to the knee that patient can recall. Right knee has pressure and then has sharp pain in the right calf. She has been icing it.  Anxiety: patient has been having a lot of anxiety, panic attacks a lot, not sleeping well. She use to take Clonazepam, Xanax and Ambien. She has done therapy before patient her therapist retired and she is not doing any therapy right now. Depression screen [facility] 2/9 12/30/2016 09/09/2015  Decreased Interest 0 0  Down, Depressed, Hopeless 0 0  PHQ - 2 Score 0 0  Altered sleeping 2 -  Tired, decreased energy 3 -  Change in appetite 0 -  Feeling bad or failure about yourself  0 -  Trouble concentrating 0 -  Moving slowly or fidgety/restless 1 -  Suicidal thoughts 0 -  PHQ-9 Score 6 -  Difficult doing work/chores Very difficult -   Patient Active Problem List   Diagnosis Date Noted  . Anxiety 09/05/2015  . Clinical depression 09/05/2015  . Big thyroid 09/05/2015  . Cannot sleep 09/05/2015  . Adiposity 09/05/2015  . 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 Current tobacco use 09/05/2015    No past medical history on file.  Social History   Social History  . Marital status: Married    Spouse name: N/A  . Number of children: N/A  . Years of education: N/A   Occupational History  . Not on file.   Social History Main Topics  . Smoking status: Current Every Day Smoker    Packs/day: 1.00      Years: 5.00    Types: Cigarettes  . Smokeless tobacco: Never Used  . Alcohol use Yes     Comment: rare  . Drug use: No  . Sexual activity: Yes    Birth control/ protection: None   Other Topics Concern  . Not on file   Social History Narrative  . No narrative on file    Outpatient Encounter Prescriptions as of 12/30/2016  Medication Sig  . clonazePAM (KLONOPIN) 0.5 MG tablet Take 1 tablet (0.5 mg total) by mouth every 8 (eight) hours as needed for anxiety.  Marland Kitchen levothyroxine (SYNTHROID, LEVOTHROID) 175 MCG tablet Take 1 tablet (175 mcg total) by mouth daily before breakfast.  . naproxen (NAPROSYN) 500 MG tablet Take 1 tablet (500 mg total) by mouth 2 (two) times daily with a meal.  . [DISCONTINUED] levothyroxine (SYNTHROID, LEVOTHROID) 175 MCG tablet Take by mouth.  . [DISCONTINUED] levothyroxine (SYNTHROID, LEVOTHROID) 50 MCG tablet Take 1 tablet (50 mcg total) by mouth daily. (Patient not taking: Reported on 12/30/2016)   No facility-administered encounter medications on file as of 12/30/2016.     Allergies  Allergen Reactions  . Penicillins   . Amoxicillin Rash    Review of Systems  Constitutional: Positive for malaise/fatigue.  Respiratory: Negative.   Cardiovascular: Positive for chest pain and palpitations.  Gastrointestinal: Negative.   Musculoskeletal: Positive for joint pain.  Psychiatric/Behavioral: The patient is nervous/anxious and has insomnia.     Objective:  BP 108/82   Pulse 100   Temp 98.8 F (37.1 C)   Resp 16   Wt 285 lb 9.6 oz (129.5 kg)   BMI 44.73 kg/m   Physical Exam  Constitutional: She is oriented to person, place, and time and well-developed, well-nourished, and in no distress.  HENT:  Head: Normocephalic and atraumatic.  Eyes: Conjunctivae are normal. No scleral icterus.  Neck: No thyromegaly present.  Cardiovascular: Normal rate, regular rhythm, normal heart sounds and intact distal pulses.  Exam reveals no gallop.   No murmur  heard. Pulmonary/Chest: Effort normal and breath sounds normal. No respiratory distress. She has no wheezes.  Abdominal: Soft.  Musculoskeletal:       Right knee: Tenderness found. Medial joint line tenderness noted.  Neurological: She is alert and oriented to person, place, and time.  Skin: Skin is warm and dry.  Psychiatric: Mood, memory, affect and judgment normal.   Assessment and Plan :  1. Other specified hypothyroidism Refill given. Patient does not have insurance so will wait to get any lab work done today. Referral placed for patient to get in with Dr Honor Junes at [facility] to follow up. Patient was seen Dr Maretta Bees.  2. Other fatigue 3. BMI 40.0-44.9, adult [facility]) Work on habits.  4. Influenza vaccination declined by patient No insurance right now  5. Current tobacco use Patient advised to quit.  6. Other insomnia  Try Clonazepam for anxiety as needed. Also discussed that counseling will be helpful for patient. Once patient gets insurance to proceed with this.  7. Right knee pain Try Naproxen. Patient does not have insurance so will hold off on xray. I do not think this is a blood clot issue at this time.  HPI, Exam and A&P transcribed by Tiffany Kocher, RMA under direction and in the presence of Miguel Aschoff, MD. I have done the exam and reviewed the chart and it is accurate to the best of my knowledge. Development worker, community has been used and  any errors in dictation or transcription are unintentional. Miguel Aschoff M.D. [facility]

## 2017-03-29 ENCOUNTER — Ambulatory Visit: Payer: 59 | Admitting: Family Medicine

## 2017-03-29 ENCOUNTER — Encounter: Payer: Self-pay | Admitting: Family Medicine

## 2017-03-29 VITALS — BP 112/68 | HR 64 | Temp 97.8°F | Resp 16 | Wt 285.0 lb

## 2017-03-29 DIAGNOSIS — F411 Generalized anxiety disorder: Secondary | ICD-10-CM

## 2017-03-29 DIAGNOSIS — F410 Panic disorder [episodic paroxysmal anxiety] without agoraphobia: Secondary | ICD-10-CM

## 2017-03-29 MED ORDER — CLONAZEPAM 0.5 MG PO TABS: 0.5000 mg | ORAL_TABLET | Freq: Three times a day (TID) | ORAL | 0 refills | Status: DC | PRN

## 2017-03-29 MED ORDER — SERTRALINE HCL 50 MG PO TABS: ORAL_TABLET | ORAL | 3 refills | Status: DC

## 2017-03-29 NOTE — Progress Notes (Signed)
Patient: Kristy Gordon Female    DOB: 1985-05-16   31 y.o.   MRN: [id number] Visit Date: 03/29/2017  Today's Provider: Vernie Murders, PA   Chief Complaint  Patient presents with  . Anxiety    Needs Refills   Subjective:    Developed anxiety 4-5 years ago when she witnessed brothers suicide and then father died from pancreatic cancer a few months later. Get very anxious and panic attacks when seeing family. Has tried Alprazolam and Clonazepam with some relief. Not sleeping well due to nightmares.    Allergies  Allergen Reactions  . Penicillins   . Amoxicillin Rash     Current Outpatient Medications:  .  clonazePAM (KLONOPIN) 0.5 MG tablet, Take 1 tablet (0.5 mg total) by mouth every 8 (eight) hours as needed for anxiety., Disp: 90 tablet, Rfl: 2 .  levothyroxine (SYNTHROID, LEVOTHROID) 175 MCG tablet, Take 1 tablet (175 mcg total) by mouth daily before breakfast., Disp: 30 tablet, Rfl: 11 .  naproxen (NAPROSYN) 500 MG tablet, Take 1 tablet (500 mg total) by mouth 2 (two) times daily with a meal., Disp: 60 tablet, Rfl: 5  Review of Systems  Constitutional: Negative.   Respiratory: Negative.  Negative for shortness of breath.   Gastrointestinal: Negative.   Neurological: Negative for dizziness, light-headedness and headaches.  Psychiatric/Behavioral: Positive for decreased concentration and sleep disturbance. Negative for agitation, behavioral problems, confusion, dysphoric mood, hallucinations, self-injury and suicidal ideas. The patient is nervous/anxious and has insomnia. The patient is not hyperactive.     Social History   Tobacco Use  . Smoking status: Current Every Day Smoker    Packs/day: 1.00    Years: 5.00    Pack years: 5.00    Types: Cigarettes  . Smokeless tobacco: Never Used  Substance Use Topics  . Alcohol use: Yes    Comment: rare   Objective:   BP 112/68 (BP Location: Right Arm, Patient Position: Sitting, Cuff Size: Normal)   Pulse 64   Temp  97.8 F (36.6 C) (Oral)   Resp 16   Wt 285 lb (129.3 kg)   LMP 03/22/2017   BMI 44.64 kg/m  Vitals:   03/29/17 1041  BP: 112/68  Pulse: 64  Resp: 16  Temp: 97.8 F (36.6 C)  TempSrc: Oral  Weight: 285 lb (129.3 kg)     Physical Exam  Constitutional: She is oriented to person, place, and time. She appears well-developed and well-nourished. No distress.  HENT:  Head: Normocephalic and atraumatic.  Right Ear: Hearing normal.  Left Ear: Hearing normal.  Nose: Nose normal.  Eyes: Conjunctivae and lids are normal. Right eye exhibits no discharge. Left eye exhibits no discharge. No scleral icterus.  Pulmonary/Chest: Effort normal. No respiratory distress.  Musculoskeletal: Normal range of motion.  Neurological: She is alert and oriented to person, place, and time.  Skin: Skin is intact. No lesion and no rash noted.  Psychiatric: Her speech is normal and behavior is normal. Thought content normal. Her mood appears anxious. She exhibits a depressed mood.      Assessment & Plan:     1. Generalized anxiety disorder with panic attacks Needs refill of Clonazepam but does not feel it is controlling anxiety and sadness adequately. Suspect symptoms are due to PTSD. Will add SSRI at bedtime. May increase Sertraline to 100 mg hs if needed in a week. May need grief counseling and follow up with Dr. Rosanna Randy in a month. - clonazePAM (KLONOPIN) 0.5 MG  tablet; Take 1 tablet (0.5 mg total) by mouth every 8 (eight) hours as needed for anxiety.  Dispense: 90 tablet; Refill: 0 - sertraline (ZOLOFT) 50 MG tablet; Take 1-2 tablets at bedtime by mouth  Dispense: 60 tablet; Refill: [place], [facility]

## 2017-05-03 ENCOUNTER — Ambulatory Visit: Payer: 59 | Admitting: Family Medicine

## 2017-05-10 ENCOUNTER — Encounter: Payer: Self-pay | Admitting: Family Medicine

## 2017-05-10 ENCOUNTER — Other Ambulatory Visit: Payer: Self-pay

## 2017-05-10 ENCOUNTER — Ambulatory Visit (INDEPENDENT_AMBULATORY_CARE_PROVIDER_SITE_OTHER): Payer: 59 | Admitting: Family Medicine

## 2017-05-10 VITALS — BP 122/84 | HR 92 | Temp 98.1°F | Resp 16 | Ht 67.0 in | Wt 281.0 lb

## 2017-05-10 DIAGNOSIS — F419 Anxiety disorder, unspecified: Secondary | ICD-10-CM

## 2017-05-10 DIAGNOSIS — E010 Iodine-deficiency related diffuse (endemic) goiter: Secondary | ICD-10-CM

## 2017-05-10 DIAGNOSIS — F410 Panic disorder [episodic paroxysmal anxiety] without agoraphobia: Secondary | ICD-10-CM

## 2017-05-10 DIAGNOSIS — F411 Generalized anxiety disorder: Principal | ICD-10-CM

## 2017-05-10 MED ORDER — PAROXETINE HCL 20 MG PO TABS: 20.0000 mg | ORAL_TABLET | Freq: Every day | ORAL | 5 refills | Status: DC

## 2017-05-10 NOTE — Progress Notes (Signed)
Patient: Kristy Gordon Female    DOB: 02/24/1986   32 y.o.   MRN: [id number] Visit Date: 05/10/2017  Today's Provider: Wilhemena Durie, MD   Chief Complaint  Patient presents with  . Anxiety   Subjective:    HPI Pt is here today for a follow up of anxiety. She was started on it on 03/29/17 by Simona Huh. She does not feel that it has done anything for her anxiety. She also asked her family and they do not think it has either. She denies any side effects. She also wants a referral to a new endocrinologist she did not like the last one she saw. She was seeing [facility].       Allergies  Allergen Reactions  . Penicillins   . Amoxicillin Rash     Current Outpatient Medications:  .  clonazePAM (KLONOPIN) 0.5 MG tablet, Take 1 tablet (0.5 mg total) by mouth every 8 (eight) hours as needed for anxiety., Disp: 90 tablet, Rfl: 0 .  levothyroxine (SYNTHROID, LEVOTHROID) 175 MCG tablet, Take 1 tablet (175 mcg total) by mouth daily before breakfast., Disp: 30 tablet, Rfl: 11 .  sertraline (ZOLOFT) 50 MG tablet, Take 1-2 tablets at bedtime by mouth, Disp: 60 tablet, Rfl: 3 .  naproxen (NAPROSYN) 500 MG tablet, Take 1 tablet (500 mg total) by mouth 2 (two) times daily with a meal. (Patient not taking: Reported on 05/10/2017), Disp: 60 tablet, Rfl: 5  Review of Systems  Constitutional: Negative.   HENT: Negative.   Eyes: Negative.   Respiratory: Negative.   Cardiovascular: Negative.   Gastrointestinal: Negative.   Endocrine: Negative.   Genitourinary: Negative.   Musculoskeletal: Negative.   Skin: Negative.   Allergic/Immunologic: Negative.   Neurological: Negative.   Hematological: Negative.   Psychiatric/Behavioral: The patient is nervous/anxious.     Social History   Tobacco Use  . Smoking status: Current Every Day Smoker    Packs/day: 1.00    Years: 5.00    Pack years: 5.00    Types: Cigarettes  . Smokeless tobacco: Never Used  Substance Use  Topics  . Alcohol use: Yes    Comment: rare   Objective:   BP 122/84 (BP Location: Left Arm, Patient Position: Sitting, Cuff Size: Large)   Pulse 92   Temp 98.1 F (36.7 C) (Oral)   Resp 16   Ht 5\' 7"  (1.702 m)   Wt 281 lb (127.5 kg)   BMI 44.01 kg/m  Vitals:   05/10/17 0957  BP: 122/84  Pulse: 92  Resp: 16  Temp: 98.1 F (36.7 C)  TempSrc: Oral  Weight: 281 lb (127.5 kg)  Height: 5\' 7"  (1.702 m)     Physical Exam  Constitutional: She is oriented to person, place, and time. She appears well-developed and well-nourished.  HENT:  Head: Normocephalic and atraumatic.  Eyes: Conjunctivae and EOM are normal. Pupils are equal, round, and reactive to light.  Neck: Normal range of motion. Neck supple. Thyromegaly present.  Mild diffuse thyromegaly.  Cardiovascular: Normal rate, regular rhythm, normal heart sounds and intact distal pulses.  Pulmonary/Chest: Effort normal and breath sounds normal.  Musculoskeletal: Normal range of motion.  Neurological: She is alert and oriented to person, place, and time. She has normal reflexes.  Skin: Skin is warm and dry.  Psychiatric: She has a normal mood and affect. Her behavior is normal. Judgment and thought content normal.        Assessment & Plan:  1. Anxiety Failed sertraline. Start Paxil follow up in 1 month. recommended counseling with Eugenia Pancoast.  - PARoxetine (PAXIL) 20 MG tablet; Take 1 tablet (20 mg total) by mouth daily.  Dispense: 30 tablet; Refill: 5  2. Big thyroid Consider thyroid US. - TSH      HPI, Exam, and A&P Transcribed under the direction and in the presence of Rebie Peale L. Cranford Mon, MD  Electronically Signed: Katina Dung, CMA  I have done the exam and reviewed the above chart and it is accurate to the best of my knowledge. Development worker, community has been used in this note in any air is in the dictation or transcription are unintentional.  Wilhemena Durie, MD  [place]

## 2017-05-10 NOTE — Patient Instructions (Signed)
Get in touch with counselor Eugenia Pancoast Office is over by [employer] phone number [phone]

## 2017-05-10 NOTE — Telephone Encounter (Signed)
Patient is requesting a refill on Clonazepam be called in at [facility].   CB#[phone]

## 2017-05-11 MED ORDER — CLONAZEPAM 0.5 MG PO TABS: 0.5000 mg | ORAL_TABLET | Freq: Three times a day (TID) | ORAL | 0 refills | Status: DC | PRN

## 2017-05-11 NOTE — Telephone Encounter (Signed)
Per Dr. Marlan Palau verbal order

## 2017-05-11 NOTE — Telephone Encounter (Signed)
Did you call RX in at pharmacy?

## 2017-06-07 ENCOUNTER — Ambulatory Visit: Payer: Self-pay | Admitting: Family Medicine

## 2017-06-14 ENCOUNTER — Encounter: Payer: Self-pay | Admitting: Family Medicine

## 2017-06-14 ENCOUNTER — Ambulatory Visit (INDEPENDENT_AMBULATORY_CARE_PROVIDER_SITE_OTHER): Payer: Self-pay | Admitting: Family Medicine

## 2017-06-14 VITALS — BP 122/86 | HR 98 | Temp 98.8°F | Resp 16

## 2017-06-14 DIAGNOSIS — E079 Disorder of thyroid, unspecified: Secondary | ICD-10-CM

## 2017-06-14 DIAGNOSIS — F419 Anxiety disorder, unspecified: Secondary | ICD-10-CM

## 2017-06-14 MED ORDER — CITALOPRAM HYDROBROMIDE 20 MG PO TABS: 20.0000 mg | ORAL_TABLET | Freq: Every day | ORAL | 5 refills | Status: DC

## 2017-06-14 MED ORDER — LORAZEPAM 0.5 MG PO TABS: 0.5000 mg | ORAL_TABLET | Freq: Three times a day (TID) | ORAL | 2 refills | Status: DC | PRN

## 2017-06-14 NOTE — Progress Notes (Signed)
Patient: Kristy Gordon Female    DOB: 1985-10-25   32 y.o.   MRN: [id number] Visit Date: 06/14/2017  Today's Provider: Wilhemena Durie, MD   Chief Complaint  Patient presents with  . Anxiety  . Follow-up   Subjective:    HPI  Anxiety:  Patient presents for a 1 month follow up. Last OV was on 05/10/17. Patient advised to change from Sertraline to Paxil 20 mg, and recommended she start counseling sessions with Eugenia Pancoast. She reports fair compliance with treatment plan. She states symptoms are unchanged. She states she has not contacted Eugenia Pancoast at this time due to insurance. Current symptoms include feeling anxious and episodes of panic attacks. She states she has to take Clonazepam to be able to leave the house due to increased anxiety. Not suicidal.    Allergies  Allergen Reactions  . Amoxicillin Rash  . Penicillins Hives     Current Outpatient Medications:  .  citalopram (CELEXA) 20 MG tablet, Take 1 tablet (20 mg total) by mouth daily., Disp: 30 tablet, Rfl: 5 .  levothyroxine (SYNTHROID, LEVOTHROID) 175 MCG tablet, Take 1 tablet (175 mcg total) by mouth daily before breakfast., Disp: 30 tablet, Rfl: 11 .  LORazepam (ATIVAN) 0.5 MG tablet, Take 1 tablet (0.5 mg total) by mouth every 8 (eight) hours as needed for anxiety., Disp: 90 tablet, Rfl: 2 .  naproxen (NAPROSYN) 500 MG tablet, Take 1 tablet (500 mg total) by mouth 2 (two) times daily with a meal. (Patient not taking: Reported on 05/10/2017), Disp: 60 tablet, Rfl: 5  Review of Systems  Constitutional: Negative.   Respiratory: Negative.   Cardiovascular: Negative.   Psychiatric/Behavioral: The patient is nervous/anxious.        Panic attacks     Social History   Tobacco Use  . Smoking status: Current Every Day Smoker    Packs/day: 1.00    Years: 5.00    Pack years: 5.00    Types: Cigarettes  . Smokeless tobacco: Never Used  Substance Use Topics  . Alcohol use: Yes    Comment: rare   Objective:   BP  122/86 (BP Location: Right Arm, Patient Position: Sitting, Cuff Size: Normal)   Pulse 98   Temp 98.8 F (37.1 C) (Oral)   Resp 16   SpO2 97%    Physical Exam  Constitutional: She is oriented to person, place, and time. She appears well-developed and well-nourished.  HENT:  Head: Normocephalic and atraumatic.  Right Ear: External ear normal.  Left Ear: External ear normal.  Nose: Nose normal.  Mouth/Throat: Oropharynx is clear and moist.  Eyes: Conjunctivae and EOM are normal. Pupils are equal, round, and reactive to light.  Neck: Normal range of motion. Neck supple. Thyromegaly present.  Diffuse thyroid enlargement  Cardiovascular: Normal rate, regular rhythm, normal heart sounds and intact distal pulses.  Pulmonary/Chest: Effort normal and breath sounds normal.  Abdominal: Soft. Bowel sounds are normal.  Musculoskeletal: Normal range of motion.  Neurological: She is alert and oriented to person, place, and time. She has normal reflexes.  Skin: Skin is warm and dry.  Psychiatric: She has a normal mood and affect. Her behavior is normal. Judgment and thought content normal.        Assessment & Plan:     1. Anxiety/Panic attack Symptoms are unchanged with Paxil.  Switch from Paxil to Citalopram 20 mg. Switch from Clonazepam 0.5 mg to Lorazepam 0.5 mg. Contact Eugenia Pancoast for counseling. Follow up  in 1-2 months.   - LORazepam (ATIVAN) 0.5 MG tablet; Take 1 tablet (0.5 mg total) by mouth every 8 (eight) hours as needed for anxiety.  Dispense: 90 tablet; Refill: 2 - citalopram (CELEXA) 20 MG tablet; Take 1 tablet (20 mg total) by mouth daily.  Dispense: 30 tablet; Refill: 5 Refer to [facility] like pt to see psychiatry but she does not have insurance.  2. Disorder of thyroid Get labs checked - TSH      I have done the exam and reviewed the above chart and it is accurate to the best of my knowledge. Development worker, community has been used in this note in any air is in the  dictation or transcription are unintentional.   Wilhemena Durie, MD  [place]

## 2017-06-15 LAB — TSH: TSH: 22.88 u[IU]/mL — ABNORMAL HIGH (ref 0.450–4.500)

## 2017-06-18 ENCOUNTER — Other Ambulatory Visit: Payer: Self-pay

## 2017-06-18 NOTE — Telephone Encounter (Signed)
-----   Message from [name], MD sent at 06/17/2017  6:27 PM EDT ----- Thyroid lower--increase synthroid from 175 to 250 mcg daily.

## 2017-06-18 NOTE — Telephone Encounter (Signed)
[facility]  ED   ----- Message from [name], MD sent at 06/17/2017  6:27 PM EDT ----- Thyroid lower--increase synthroid from 175 to 250 mcg daily.

## 2017-06-18 NOTE — Telephone Encounter (Signed)
Patient advised, I tried to put in order for Synthroid but when I clicked on available strengths 269mcg was not listed, the highest doses I see is 257mcg and 342mcg. Please advise. KW

## 2017-06-21 ENCOUNTER — Other Ambulatory Visit: Payer: Self-pay

## 2017-06-21 MED ORDER — LEVOTHYROXINE SODIUM 125 MCG PO TABS: 250.0000 ug | ORAL_TABLET | Freq: Every day | ORAL | 3 refills | Status: DC

## 2017-06-21 NOTE — Telephone Encounter (Signed)
Have sent in 125 mcg, 2 daily

## 2017-07-20 ENCOUNTER — Ambulatory Visit: Payer: Self-pay | Admitting: Family Medicine

## 2017-07-20 DIAGNOSIS — F419 Anxiety disorder, unspecified: Secondary | ICD-10-CM

## 2017-07-20 DIAGNOSIS — M25561 Pain in right knee: Secondary | ICD-10-CM

## 2017-07-20 MED ORDER — LEVOTHYROXINE SODIUM 125 MCG PO TABS: 250.0000 ug | ORAL_TABLET | Freq: Every day | ORAL | 0 refills | Status: DC

## 2017-07-20 MED ORDER — NAPROXEN 500 MG PO TABS: 500.0000 mg | ORAL_TABLET | Freq: Two times a day (BID) | ORAL | 0 refills | Status: DC

## 2017-07-20 MED ORDER — CLONAZEPAM 0.5 MG PO TABS: 0.5000 mg | ORAL_TABLET | Freq: Three times a day (TID) | ORAL | 0 refills | Status: DC | PRN

## 2017-07-20 MED ORDER — CITALOPRAM HYDROBROMIDE 20 MG PO TABS: 20.0000 mg | ORAL_TABLET | Freq: Every day | ORAL | 0 refills | Status: DC

## 2017-07-20 NOTE — Progress Notes (Signed)
Kristy Gordon  MRN: [id number] DOB: 1985-09-20  Subjective:  HPI  The patient is a 32 year old female who ropesents for followup of her anxiety.  She was lst seen on 3/11/9 and at that time the medication changes were as follows; Paxil was changed to Citalopram and Clonazepam was changed to Lorasepma.  She feels the Citalopram is good but wants to go back on the Clonazepam instead of the Lorazepam.  She reports that she and her husband will be moving to [facility] on Fridaya nd would like to get 3 months worth of prescriptions until she can get set up with a provider in [facility].  Mother just got out of hospital for DTs and she is doing well. Back to norm,al--not drinking.  Patient Active Problem List   Diagnosis Date Noted  . Anxiety 09/05/2015  . Clinical depression 09/05/2015  . Big thyroid 09/05/2015  . Cannot sleep 09/05/2015  . Adiposity 09/05/2015  . Nondependent opioid abuse in remission ([place]) 09/05/2015  . 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 Current tobacco use 09/05/2015    No past medical history on file.  Social History   Socioeconomic History  . Marital status: Married    Spouse name: Not on file  . Number of children: Not on file  . Years of education: Not on file  . Highest education level: Not on file  Occupational History  . Not on file  Social Needs  . Financial resource strain: Not on file  . Food insecurity:    Worry: Not on file    Inability: Not on file  . Transportation needs:    Medical: Not on file    Non-medical: Not on file  Tobacco Use  . Smoking status: Current Every Day Smoker    Packs/day: 1.00    Years: 5.00    Pack years: 5.00    Types: Cigarettes  . Smokeless tobacco: Never Used  Substance and Sexual Activity  . Alcohol use: Yes    Comment: rare  . Drug use: No  . Sexual activity: Yes    Birth control/protection: None  Lifestyle  . Physical activity:    Days per week: Not on file    Minutes per session: Not on file  . Stress:  Not on file  Relationships  . Social connections:    Talks on phone: Not on file    Gets together: Not on file    Attends religious service: Not on file    Active member of club or organization: Not on file    Attends meetings of clubs or organizations: Not on file    Relationship status: Not on file  . Intimate partner violence:    Fear of current or ex partner: Not on file    Emotionally abused: Not on file    Physically abused: Not on file    Forced sexual activity: Not on file  Other Topics Concern  . Not on file  Social History Narrative  . Not on file    Outpatient Encounter Medications as of 07/20/2017  Medication Sig  . citalopram (CELEXA) 20 MG tablet Take 1 tablet (20 mg total) by mouth daily.  Marland Kitchen levothyroxine (SYNTHROID, LEVOTHROID) 125 MCG tablet Take 2 tablets (250 mcg total) by mouth daily.  Marland Kitchen LORazepam (ATIVAN) 0.5 MG tablet Take 1 tablet (0.5 mg total) by mouth every 8 (eight) hours as needed for anxiety.  . naproxen (NAPROSYN) 500 MG tablet Take 1 tablet (500 mg total) by mouth  2 (two) times daily with a meal.   No facility-administered encounter medications on file as of 07/20/2017.     Allergies  Allergen Reactions  . Amoxicillin Rash  . Penicillins Hives    Review of Systems  Constitutional: Negative for fever and malaise/fatigue.  Eyes: Negative.   Respiratory: Negative for cough, shortness of breath and wheezing.   Cardiovascular: Negative for chest pain, palpitations, orthopnea, claudication and leg swelling.  Skin: Negative.   Endo/Heme/Allergies: Negative.   Psychiatric/Behavioral: Negative for depression and suicidal ideas. The patient is not nervous/anxious and does not have insomnia.     Objective:  BP 138/80 (BP Location: Right Arm, Patient Position: Sitting, Cuff Size: Large)   Pulse (!) 106   Temp 98.6 F (37 C) (Oral)   Resp (!) 118   Wt 272 lb (123.4 kg)   BMI 42.60 kg/m   Physical Exam  Constitutional: She is oriented to person,  place, and time and well-developed, well-nourished, and in no distress.  HENT:  Head: Normocephalic and atraumatic.  Eyes: Conjunctivae are normal.  Neck: Thyromegaly present.  Chronic diffuse goiter.  Cardiovascular: Normal rate, regular rhythm and normal heart sounds.  Pulmonary/Chest: Effort normal.  Lymphadenopathy:    She has no cervical adenopathy.  Neurological: She is alert and oriented to person, place, and time. Gait normal. GCS score is 15.  Skin: Skin is warm and dry.  Psychiatric: Mood, memory, affect and judgment normal.    Assessment and Plan :  1. Anxiety,Chronic Advised pt to minimize Benzo. Advised to get counselor. To find PCP in [place]. - citalopram (CELEXA) 20 MG tablet; Take 1 tablet (20 mg total) by mouth daily.  Dispense: 90 tablet; Refill: Klonopin for 3 months.  2. Acute pain of right knee  - naproxen (NAPROSYN) 500 MG tablet; Take 1 tablet (500 mg total) by mouth 2 (two) times daily with a meal.  Dispense: 180 tablet; Refill: 0 3.Obesity 4.Goiter Normal endocrine evaluation.  I have done the exam and reviewed the chart and it is accurate to the best of my knowledge. Development worker, community has been used and  any errors in dictation or transcription are unintentional. Miguel Aschoff M.D. [facility]

## 2017-08-05 ENCOUNTER — Ambulatory Visit: Payer: Self-pay | Admitting: Family Medicine

## 2017-11-08 ENCOUNTER — Telehealth: Payer: Self-pay | Admitting: Family Medicine

## 2017-11-10 ENCOUNTER — Ambulatory Visit: Payer: Self-pay | Admitting: Family Medicine

## 2017-11-10 ENCOUNTER — Encounter: Payer: Self-pay | Admitting: Family Medicine

## 2017-11-10 VITALS — BP 134/94 | HR 78 | Temp 98.3°F | Resp 16 | Wt 251.8 lb

## 2017-11-10 DIAGNOSIS — F419 Anxiety disorder, unspecified: Secondary | ICD-10-CM

## 2017-11-10 DIAGNOSIS — E079 Disorder of thyroid, unspecified: Secondary | ICD-10-CM

## 2017-11-10 MED ORDER — CITALOPRAM HYDROBROMIDE 20 MG PO TABS: 20.0000 mg | ORAL_TABLET | Freq: Every day | ORAL | 1 refills | Status: DC

## 2017-11-10 MED ORDER — CLONAZEPAM 0.5 MG PO TABS: 0.5000 mg | ORAL_TABLET | Freq: Three times a day (TID) | ORAL | 0 refills | Status: DC | PRN

## 2017-11-10 MED ORDER — LEVOTHYROXINE SODIUM 200 MCG PO TABS: 200.0000 ug | ORAL_TABLET | Freq: Every day | ORAL | 0 refills | Status: DC

## 2017-11-10 NOTE — Progress Notes (Signed)
Patient: Kristy Gordon Female    DOB: 1985-10-31   32 y.o.   MRN: [id number] Visit Date: 11/10/2017  Today's Provider: Lelon Huh, MD   Chief Complaint  Patient presents with  . Anxiety   Subjective:    HPI  Anxiety,Chronic From 07/20/2017-seen by Dr. Rosanna Randy. Changed from lorazepam back to clonazepam which she feels is more effective. Advised patient to minimize Benzo use. Advised to get counselor. To find PCP in [facility].  Patient returns to office today for follow up she states that she is feeling well today, patient states that she has moved to [facility] and has adjusted well with move. Patient is still currently looking for a PCP and counselor in [place] but cannot find a provide that will take her with due to her medications. 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 She states her anxiety is much better and some days only takes one clonazepam, but usually takes twice, and occasionally needs to take a third. She states she is taking citalopram consistently every day and tolerating well.  She also states she needs refill for her thyroid medication Lab Results  Component Value Date   TSH 22.880 (H) 06/14/2017   She was supposed to have increased from 136mcg a day  to 2 x 112mcg levothyroxine a day after her last tsh was checked in march. But today she states she didn't realize that she was supposed to be taking two and has only been taking one daily. She has been feeling fatigued and feels she probably needs to take higher dose.     Allergies  Allergen Reactions  . Amoxicillin Rash  . Penicillins Hives     Current Outpatient Medications:  .  citalopram (CELEXA) 20 MG tablet, Take 1 tablet (20 mg total) by mouth daily., Disp: 90 tablet, Rfl: 0 .  clonazePAM (KLONOPIN) 0.5 MG tablet, Take 1 tablet (0.5 mg total) by mouth 3 (three) times daily as needed for anxiety., Disp: 270 tablet, Rfl: 0 .  levothyroxine (SYNTHROID, LEVOTHROID) 125 MCG tablet, Take 2  tablets (250 mcg total) by mouth daily., Disp: 180 tablet, Rfl: 0 (PATIENT ONLY TAKING ONE TABLET DAILY) .  naproxen (NAPROSYN) 500 MG tablet, Take 1 tablet (500 mg total) by mouth 2 (two) times daily with a meal. (Patient not taking: Reported on 11/10/2017), Disp: 180 tablet, Rfl: 0  Review of Systems  Constitutional: Negative for appetite change, chills, fatigue and fever.  Respiratory: Negative for chest tightness and shortness of breath.   Cardiovascular: Negative for chest pain and palpitations.  Gastrointestinal: Negative for abdominal pain, nausea and vomiting.  Neurological: Negative for dizziness and weakness.  Psychiatric/Behavioral: Negative for agitation, behavioral problems, confusion, decreased concentration, dysphoric mood, hallucinations, self-injury, sleep disturbance and suicidal ideas. The patient is nervous/anxious. The patient is not hyperactive.     Social History   Tobacco Use  . Smoking status: Current Every Day Smoker    Packs/day: 1.00    Years: 5.00    Pack years: 5.00    Types: Cigarettes  . Smokeless tobacco: Never Used  Substance Use Topics  . Alcohol use: Yes    Comment: rare   Objective:   BP (!) 134/94   Pulse 78   Temp 98.3 F (36.8 C) (Oral)   Resp 16   Wt 251 lb 12.8 oz (114.2 kg)   LMP 11/08/2017 (Exact Date)   SpO2 98%   BMI 39.44 kg/m  Physical Exam  General appearance: alert, well developed, well nourished, cooperative and in no distress Head: Normocephalic, without obvious abnormality, atraumatic Respiratory: Respirations even and unlabored, normal respiratory rate Extremities: No gross deformities Skin: Skin color, texture, turgor normal. No rashes seen  Psych: Appropriate mood and affect. Neurologic: Mental status: Alert, oriented to person, place, and time, thought content appropriate.     Assessment & Plan:     1. Anxiety Doing well with current regiment and working on reducing clonazepam. Less stress with recent move  and change in jobs.  Is still trying to find PCP in [facility] that will manage her current medications. She does travel to [place] frequently so she may follow up here if she can't find a new PCP in the next few months.  - citalopram (CELEXA) 20 MG tablet; Take 1 tablet (20 mg total) by mouth daily.  Dispense: 90 tablet; Refill: 1 - clonazePAM (KLONOPIN) 0.5 MG tablet; Take 1 tablet (0.5 mg total) by mouth 3 (three) times daily as needed for anxiety.  Dispense: 270 tablet; Refill: 0  2. Disorder of thyroid Is only taking 1 x 168mcg a day. Will change from 125 tablets to 244mcg a day due to elevated TSH March - levothyroxine (SYNTHROID, LEVOTHROID) 200 MCG tablet; Take 1 tablet (200 mcg total) by mouth daily.  Dispense: 90 tablet; Refill: 0  Advised she needs to have TSH check in 2-3 months.        Lelon Huh, MD  [facility]

## 2018-01-04 DIAGNOSIS — R1030 Lower abdominal pain, unspecified: Secondary | ICD-10-CM | POA: Insufficient documentation

## 2018-01-13 ENCOUNTER — Ambulatory Visit: Payer: Self-pay | Admitting: Maternal Newborn

## 2018-01-20 ENCOUNTER — Ambulatory Visit: Payer: Self-pay | Admitting: Maternal Newborn

## 2018-02-08 ENCOUNTER — Ambulatory Visit (INDEPENDENT_AMBULATORY_CARE_PROVIDER_SITE_OTHER): Payer: Self-pay | Admitting: Family Medicine

## 2018-02-08 ENCOUNTER — Other Ambulatory Visit: Payer: Self-pay | Admitting: Family Medicine

## 2018-02-08 VITALS — BP 122/84 | HR 95 | Temp 98.5°F | Resp 16 | Wt 248.0 lb

## 2018-02-08 DIAGNOSIS — F419 Anxiety disorder, unspecified: Secondary | ICD-10-CM

## 2018-02-08 DIAGNOSIS — E079 Disorder of thyroid, unspecified: Secondary | ICD-10-CM

## 2018-02-08 MED ORDER — CLONAZEPAM 0.5 MG PO TABS: 0.5000 mg | ORAL_TABLET | Freq: Three times a day (TID) | ORAL | 0 refills | Status: DC | PRN

## 2018-02-08 MED ORDER — CITALOPRAM HYDROBROMIDE 20 MG PO TABS: 20.0000 mg | ORAL_TABLET | Freq: Every day | ORAL | 1 refills | Status: DC

## 2018-02-08 NOTE — Progress Notes (Signed)
Kristy Gordon  MRN: [id number] DOB: 1986/02/12  Subjective:  HPI  The patient is a 32 year old female who presents for follow up of her anxiety and to refill medications.  She was last seen on 11/10/17.   She is also on Levothyroxine for hypothyroidism.   Lab Results  Component Value Date   TSH 22.880 (H) 06/14/2017   Her last lab had worsened and her medication was increased from 175 mcg daily to 250 mcg daily.  However she was seen by Dr Caryn Section in August and he only gave her 200 mcg.  She will need follow up labs today. The patient reports that she may have gone from 175 to 250 then back to 200.     Patient Active Problem List   Diagnosis Date Noted  . Anxiety 09/05/2015  . Clinical depression 09/05/2015  . Big thyroid 09/05/2015  . Cannot sleep 09/05/2015  . Adiposity 09/05/2015  . Nondependent opioid abuse in remission ([place]) 09/05/2015  . 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 Current tobacco use 09/05/2015    No past medical history on file.  Social History   Socioeconomic History  . Marital status: Married    Spouse name: Not on file  . Number of children: Not on file  . Years of education: Not on file  . Highest education level: Not on file  Occupational History  . Not on file  Social Needs  . Financial resource strain: Not on file  . Food insecurity:    Worry: Not on file    Inability: Not on file  . Transportation needs:    Medical: Not on file    Non-medical: Not on file  Tobacco Use  . Smoking status: Current Every Day Smoker    Packs/day: 1.00    Years: 5.00    Pack years: 5.00    Types: Cigarettes  . Smokeless tobacco: Never Used  Substance and Sexual Activity  . Alcohol use: Yes    Comment: rare  . Drug use: No  . Sexual activity: Yes    Birth control/protection: None  Lifestyle  . Physical activity:    Days per week: Not on file    Minutes per session: Not on file  . Stress: Not on file  Relationships  . Social connections:    Talks on  phone: Not on file    Gets together: Not on file    Attends religious service: Not on file    Active member of club or organization: Not on file    Attends meetings of clubs or organizations: Not on file    Relationship status: Not on file  . Intimate partner violence:    Fear of current or ex partner: Not on file    Emotionally abused: Not on file    Physically abused: Not on file    Forced sexual activity: Not on file  Other Topics Concern  . Not on file  Social History Narrative  . Not on file    Outpatient Encounter Medications as of 02/08/2018  Medication Sig  . citalopram (CELEXA) 20 MG tablet Take 1 tablet (20 mg total) by mouth daily.  . clonazePAM (KLONOPIN) 0.5 MG tablet Take 1 tablet (0.5 mg total) by mouth 3 (three) times daily as needed for anxiety.  Marland Kitchen levothyroxine (SYNTHROID, LEVOTHROID) 200 MCG tablet Take 1 tablet (200 mcg total) by mouth daily.  . [DISCONTINUED] naproxen (NAPROSYN) 500 MG tablet Take 1 tablet (500 mg total) by mouth 2 (two)  times daily with a meal.   No facility-administered encounter medications on file as of 02/08/2018.     Allergies  Allergen Reactions  . Amoxicillin Rash  . Penicillins Hives    Review of Systems  Constitutional: Positive for malaise/fatigue. Negative for fever.  HENT: Negative.   Eyes: Negative.   Respiratory: Negative for cough, shortness of breath and wheezing.   Cardiovascular: Negative for chest pain, palpitations, orthopnea, claudication and leg swelling.  Gastrointestinal: Negative.   Skin: Negative.   Endo/Heme/Allergies: Negative.   Psychiatric/Behavioral: Negative.     Objective:  BP 122/84 (BP Location: Right Arm, Patient Position: Sitting, Cuff Size: Large)   Pulse 95   Temp 98.5 F (36.9 C) (Oral)   Resp 16   Wt 248 lb (112.5 kg)   SpO2 99%   BMI 38.84 kg/m   Physical Exam  Constitutional: She is oriented to person, place, and time and well-developed, well-nourished, and in no distress.  HENT:    Head: Normocephalic and atraumatic.  Eyes: No scleral icterus.  Neck: Thyromegaly present.  Large stable goiter.  Cardiovascular: Normal rate, regular rhythm and normal heart sounds.  Pulmonary/Chest: Effort normal and breath sounds normal.  Abdominal: Soft.  Musculoskeletal: She exhibits no edema.  Neurological: She is alert and oriented to person, place, and time. Gait normal. GCS score is 15.  Skin: Skin is warm and dry.  Psychiatric: Mood, memory, affect and judgment normal.    Assessment and Plan :   1. Anxiety  - citalopram (CELEXA) 20 MG tablet; Take 1 tablet (20 mg total) by mouth daily.  Dispense: 90 tablet; Refill: 1 - clonazePAM (KLONOPIN) 0.5 MG tablet; Take 1 tablet (0.5 mg total) by mouth 3 (three) times daily as needed for anxiety.  Dispense: 270 tablet; Refill: 0  2. Disorder of thyroid  - TSH  HPI, Exam and A&P Transcribed under the direction and in the presence of Miguel Aschoff, Brooke Bonito., MD. Electronically Signed: Althea Charon, RMA I have done the exam and reviewed the chart and it is accurate to the best of my knowledge. Development worker, community has been used and  any errors in dictation or transcription are unintentional. Miguel Aschoff M.D. [facility]

## 2018-02-08 NOTE — Telephone Encounter (Signed)
Pt called saying she was just in to see Dr. Rosanna Randy and one of her medication was not sent to the pharmacy  Clonazepam 0.5 mg # 270  Please send to [facility]

## 2018-02-09 LAB — TSH: TSH: 9.26 u[IU]/mL — AB (ref 0.450–4.500)

## 2018-02-09 MED ORDER — CLONAZEPAM 0.5 MG PO TABS: 0.5000 mg | ORAL_TABLET | Freq: Three times a day (TID) | ORAL | 0 refills | Status: DC | PRN

## 2018-02-09 MED ORDER — CITALOPRAM HYDROBROMIDE 20 MG PO TABS: 20.0000 mg | ORAL_TABLET | Freq: Every day | ORAL | 1 refills | Status: DC

## 2018-02-16 ENCOUNTER — Telehealth: Payer: Self-pay

## 2018-02-16 DIAGNOSIS — E079 Disorder of thyroid, unspecified: Secondary | ICD-10-CM

## 2018-02-16 NOTE — Telephone Encounter (Signed)
Left message to call back  

## 2018-02-16 NOTE — Telephone Encounter (Signed)
-----   Message from [name], MD sent at 02/10/2018 10:30 AM EST ----- Thyroid better but still a little low.  Would increase Synthroid to 225 mcg daily.  I do not think it comes at that dose..  Probably 112 and take 2 daily

## 2018-02-21 NOTE — Telephone Encounter (Signed)
Patient advised as below. Patient verbalizes understanding and is in agreement with treatment plan. Please send in new prescription to [facility] on [address].

## 2018-02-22 MED ORDER — LEVOTHYROXINE SODIUM 112 MCG PO TABS: 224.0000 ug | ORAL_TABLET | Freq: Every day | ORAL | 5 refills | Status: DC

## 2018-02-22 NOTE — Telephone Encounter (Signed)
Sent in levothyroxine 112 (2) tablets daily.

## 2018-02-24 ENCOUNTER — Encounter: Payer: Self-pay | Admitting: Obstetrics and Gynecology

## 2018-03-06 DIAGNOSIS — C539 Malignant neoplasm of cervix uteri, unspecified: Secondary | ICD-10-CM

## 2018-03-06 HISTORY — DX: Malignant neoplasm of cervix uteri, unspecified: C53.9

## 2018-03-07 NOTE — Progress Notes (Signed)
Patient: Kristy Gordon Female    DOB: [birth year]   32 y.o.   MRN: [id number] Visit Date: 03/08/2018  Today's Provider: Vernie Murders, PA   Chief Complaint  Patient presents with  . Anxiety   Subjective:    HPI  Anxiety From 02/08/2018-saw Dr. Rosanna Randy.   Patient is here concerning her anxiety medication. Patient states anxiety medication is not working at all.  History reviewed. No pertinent past medical history. Patient Active Problem List   Diagnosis Date Noted  . Anxiety 09/05/2015  . Clinical depression 09/05/2015  . Big thyroid 09/05/2015  . Cannot sleep 09/05/2015  . Adiposity 09/05/2015  . Nondependent opioid abuse in remission ([place]) 09/05/2015  . 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 Current tobacco use 09/05/2015   Past Surgical History:  Procedure Laterality Date  . BUNIONECTOMY Right   . TONSILLECTOMY     Family History  Adopted: Yes  Family history unknown: Yes   Allergies  Allergen Reactions  . Amoxicillin Rash  . Penicillins Hives    Current Outpatient Medications:  .  citalopram (CELEXA) 20 MG tablet, Take 1 tablet (20 mg total) by mouth daily., Disp: 90 tablet, Rfl: 1 .  clonazePAM (KLONOPIN) 0.5 MG tablet, Take 1 tablet (0.5 mg total) by mouth 3 (three) times daily as needed for anxiety., Disp: 270 tablet, Rfl: 0 .  levothyroxine (SYNTHROID, LEVOTHROID) 112 MCG tablet, Take 2 tablets (224 mcg total) by mouth daily., Disp: 60 tablet, Rfl: 5  Review of Systems  Constitutional: Negative for appetite change, chills, fatigue and fever.  Respiratory: Positive for chest tightness and shortness of breath.   Cardiovascular: Negative for chest pain and palpitations.  Gastrointestinal: Negative for abdominal pain, nausea and vomiting.  Neurological: Negative for dizziness and weakness.  Psychiatric/Behavioral: Positive for sleep disturbance. The patient is nervous/anxious.    Social History   Tobacco Use  . Smoking status: Current Every Day  Smoker    Packs/day: 1.00    Years: 5.00    Pack years: 5.00    Types: Cigarettes  . Smokeless tobacco: Never Used  Substance Use Topics  . Alcohol use: Yes    Comment: rare   Objective:   BP 110/64 (BP Location: Right Arm, Patient Position: Sitting, Cuff Size: Large)   Pulse 98   Temp 98 F (36.7 C) (Oral)   Resp 16   Wt 251 lb (113.9 kg)   SpO2 98%   BMI 39.31 kg/m  Vitals:   03/08/18 0901  BP: 110/64  Pulse: 98  Resp: 16  Temp: 98 F (36.7 C)  TempSrc: Oral  SpO2: 98%  Weight: 251 lb (113.9 kg)   Physical Exam  Constitutional: She is oriented to person, place, and time. She appears well-developed and well-nourished. No distress.  HENT:  Head: Normocephalic and atraumatic.  Right Ear: Hearing normal.  Left Ear: Hearing normal.  Nose: Nose normal.  Eyes: Conjunctivae and lids are normal. Right eye exhibits no discharge. Left eye exhibits no discharge. No scleral icterus.  Neck: Neck supple. Thyromegaly present.  Cardiovascular: Normal rate and regular rhythm.  Pulmonary/Chest: Effort normal and breath sounds normal. No respiratory distress.  Abdominal: Soft. Bowel sounds are normal.  Musculoskeletal: Normal range of motion.  Neurological: She is alert and oriented to person, place, and time.  Skin: Skin is intact. No lesion and no rash noted.  Psychiatric: Her speech is normal and behavior is normal. Thought content normal. Her mood appears anxious.  Assessment & Plan:     1. Severe anxiety with panic Worsening anxiety with panic attacks causing some chest tightness and dyspnea with jittery sensation. Recent increase in Levothyroxine to 224 mcg qd but no BP or heart rate changes. Patient tried to double dosage of the Clonazepam without relief. In tears today but vitals normal. Will give short coarse of Xanax 0.5 mg TID and stop the Clonazepam with increase of Citalopram to 40 mg qd. Recheck in 1 week. If no better control of panic, will need referral to  psychiatry. Has been tried on Wellbutrin, Trazodone, Seroquel, etc in the past with hangover side effects and refuses to try them again. May need to recheck T3 and T4 if persistent symptoms next week. - citalopram (CELEXA) 40 MG tablet; Take 1 tablet (40 mg total) by mouth daily. - ALPRAZolam (XANAX) 0.5 MG tablet; Take 1 tablet (0.5 mg total) by mouth 3 (three) times daily as needed for anxiety.  Dispense: 30 tablet; Refill: L[place], PA  [facility]

## 2018-03-08 ENCOUNTER — Encounter: Payer: Self-pay | Admitting: Family Medicine

## 2018-03-08 ENCOUNTER — Ambulatory Visit (INDEPENDENT_AMBULATORY_CARE_PROVIDER_SITE_OTHER): Payer: Self-pay | Admitting: Family Medicine

## 2018-03-08 VITALS — BP 110/64 | HR 98 | Temp 98.0°F | Resp 16 | Wt 251.0 lb

## 2018-03-08 DIAGNOSIS — F410 Panic disorder [episodic paroxysmal anxiety] without agoraphobia: Secondary | ICD-10-CM

## 2018-03-08 MED ORDER — ALPRAZOLAM 0.5 MG PO TABS: 0.5000 mg | ORAL_TABLET | Freq: Three times a day (TID) | ORAL | 0 refills | Status: DC | PRN

## 2018-03-08 MED ORDER — CITALOPRAM HYDROBROMIDE 40 MG PO TABS: 40.0000 mg | ORAL_TABLET | Freq: Every day | ORAL | Status: DC

## 2018-03-14 NOTE — Progress Notes (Deleted)
       Patient: Kristy Gordon Female    DOB: May 06, 1985   32 y.o.   MRN: [id number] Visit Date: 03/14/2018  Today's Provider: Vernie Murders, PA   No chief complaint on file.  Subjective:    HPI  Severe anxiety with panic From 03/08/2018-given short coarse of Xanax 0.5 mg TID and stopped the Clonazepam with increase of Citalopram to 40 mg qd. Recheck in 1 week. If no better control of panic, will need referral to psychiatry.   Allergies  Allergen Reactions  . Amoxicillin Rash  . Penicillins Hives     Current Outpatient Medications:  .  ALPRAZolam (XANAX) 0.5 MG tablet, Take 1 tablet (0.5 mg total) by mouth 3 (three) times daily as needed for anxiety., Disp: 30 tablet, Rfl: 0 .  citalopram (CELEXA) 40 MG tablet, Take 1 tablet (40 mg total) by mouth daily., Disp: , Rfl:  .  levothyroxine (SYNTHROID, LEVOTHROID) 112 MCG tablet, Take 2 tablets (224 mcg total) by mouth daily., Disp: 60 tablet, Rfl: 5  Review of Systems  Social History   Tobacco Use  . Smoking status: Current Every Day Smoker    Packs/day: 1.00    Years: 5.00    Pack years: 5.00    Types: Cigarettes  . Smokeless tobacco: Never Used  Substance Use Topics  . Alcohol use: Yes    Comment: rare   Objective:   There were no vitals taken for this visit. There were no vitals filed for this visit.   Physical Exam      Assessment & Plan:           Vernie Murders, PA  [facility]

## 2018-03-15 ENCOUNTER — Ambulatory Visit: Payer: Self-pay | Admitting: Family Medicine

## 2018-03-15 ENCOUNTER — Emergency Department: Payer: Self-pay

## 2018-03-15 ENCOUNTER — Telehealth: Payer: Self-pay

## 2018-03-15 ENCOUNTER — Emergency Department
Admission: EM | Admit: 2018-03-15 | Discharge: 2018-03-15 | Disposition: A | Discharge status: Home / Self Care | Payer: Self-pay | Attending: Emergency Medicine | Admitting: Emergency Medicine

## 2018-03-15 DIAGNOSIS — N888 Other specified noninflammatory disorders of cervix uteri: Secondary | ICD-10-CM | POA: Insufficient documentation

## 2018-03-15 DIAGNOSIS — R1032 Left lower quadrant pain: Secondary | ICD-10-CM | POA: Insufficient documentation

## 2018-03-15 DIAGNOSIS — N939 Abnormal uterine and vaginal bleeding, unspecified: Secondary | ICD-10-CM

## 2018-03-15 DIAGNOSIS — D649 Anemia, unspecified: Secondary | ICD-10-CM | POA: Insufficient documentation

## 2018-03-15 DIAGNOSIS — Z79899 Other long term (current) drug therapy: Secondary | ICD-10-CM | POA: Insufficient documentation

## 2018-03-15 DIAGNOSIS — Z8759 Personal history of other complications of pregnancy, childbirth and the puerperium: Secondary | ICD-10-CM | POA: Insufficient documentation

## 2018-03-15 DIAGNOSIS — Z3202 Encounter for pregnancy test, result negative: Secondary | ICD-10-CM | POA: Insufficient documentation

## 2018-03-15 DIAGNOSIS — F17210 Nicotine dependence, cigarettes, uncomplicated: Secondary | ICD-10-CM | POA: Insufficient documentation

## 2018-03-15 LAB — URINALYSIS, COMPLETE (UACMP) WITH MICROSCOPIC
Bacteria, UA: NONE SEEN
Glucose, UA: NEGATIVE mg/dL
Hgb urine dipstick: NEGATIVE
Ketones, ur: 20 mg/dL — AB
Leukocytes, UA: NEGATIVE
NITRITE: NEGATIVE
Protein, ur: >=300 mg/dL — AB
Specific Gravity, Urine: >1.046 — ABNORMAL HIGH (ref 1.005–1.030)
pH: 5 (ref 5.0–8.0)

## 2018-03-15 LAB — COMPREHENSIVE METABOLIC PANEL
ALT: 10 U/L (ref 0–44)
AST: 12 U/L — ABNORMAL LOW (ref 15–41)
Albumin: 3.5 g/dL (ref 3.5–5.0)
Alkaline Phosphatase: 92 U/L (ref 38–126)
Anion gap: 12 (ref 5–15)
BILIRUBIN TOTAL: 0.6 mg/dL (ref 0.3–1.2)
BUN: 10 mg/dL (ref 6–20)
CO2: 21 mmol/L — ABNORMAL LOW (ref 22–32)
Calcium: 9 mg/dL (ref 8.9–10.3)
Chloride: 106 mmol/L (ref 98–111)
Creatinine, Ser: 0.54 mg/dL (ref 0.44–1.00)
GFR calc Af Amer: >60 mL/min (ref >60)
GFR calc non Af Amer: >60 mL/min (ref >60)
Glucose, Bld: 98 mg/dL (ref 70–99)
Potassium: 3.3 mmol/L — ABNORMAL LOW (ref 3.5–5.1)
Sodium: 139 mmol/L (ref 135–145)
TOTAL PROTEIN: 9.5 g/dL — AB (ref 6.5–8.1)

## 2018-03-15 LAB — CBC
HCT: 34.9 % — ABNORMAL LOW (ref 36.0–46.0)
Hemoglobin: 10.8 g/dL — ABNORMAL LOW (ref 12.0–15.0)
MCH: 25 pg — ABNORMAL LOW (ref 26.0–34.0)
MCHC: 30.9 g/dL (ref 30.0–36.0)
MCV: 80.8 fL (ref 80.0–100.0)
NRBC: 0 % (ref 0.0–0.2)
Platelets: 742 10*3/uL — ABNORMAL HIGH (ref 150–400)
RBC: 4.32 MIL/uL (ref 3.87–5.11)
RDW: 14.6 % (ref 11.5–15.5)
WBC: 13.8 10*3/uL — AB (ref 4.0–10.5)

## 2018-03-15 LAB — HCG, QUANTITATIVE, PREGNANCY: HCG, BETA CHAIN, QUANT, S: 2 m[IU]/mL (ref <5)

## 2018-03-15 LAB — LIPASE, BLOOD: LIPASE: 25 U/L (ref 11–51)

## 2018-03-15 LAB — POCT PREGNANCY, URINE: Preg Test, Ur: NEGATIVE

## 2018-03-15 MED ORDER — ALPRAZOLAM 0.5 MG PO TABS
0.5000 mg | ORAL_TABLET | Freq: Once | ORAL | Status: AC
  Administered 2018-03-15: 0.5 mg via ORAL
  Filled 2018-03-15: qty 1

## 2018-03-15 MED ORDER — IBUPROFEN 600 MG PO TABS
600.0000 mg | ORAL_TABLET | Freq: Once | ORAL | Status: DC
  Filled 2018-03-15: qty 1

## 2018-03-15 MED ORDER — LORAZEPAM 0.5 MG PO TABS: 0.5000 mg | ORAL_TABLET | Freq: Three times a day (TID) | ORAL | 0 refills | Status: DC | PRN

## 2018-03-15 MED ORDER — ACETAMINOPHEN 325 MG PO TABS: 650.0000 mg | ORAL_TABLET | Freq: Once | ORAL | Status: DC | PRN

## 2018-03-15 MED ORDER — HYDROCODONE-ACETAMINOPHEN 5-325 MG PO TABS: 1.0000 | ORAL_TABLET | ORAL | 0 refills | Status: DC | PRN

## 2018-03-15 NOTE — Discharge Instructions (Signed)
Please follow-up on Thursday with OB/GYN as scheduled.  Return to the emergency department for any significant abdominal pain development of fever 101 or higher, or any other symptoms personally concerning to yourself.

## 2018-03-15 NOTE — Telephone Encounter (Signed)
FYI...  Pt called in today complaining of increasing abdominal pain and vaginal bleeding and clotting.  She states this has been going on for several weeks but has become severe today.  She states the bleeding has been constant for a few days and it is not her "Normal Period".  She did take a home pregnancy test which was negative.  She also complains of feeling "very shaky".  I advised her it would be best for her to go to the ER to be evaluated urgently.  She agreed and stated she has someone to take her there.     Thanks,   -Mickel Baas

## 2018-03-15 NOTE — Consult Note (Signed)
Gynecology Consult Note  Kristy Gordon is an 32 y.o. female.  HPI:  She presents today to the ER for sharp right sided abdominal pain that feels like uterine cramps. This pain has been present for about 2 weeks.  She reported irregular bleeding and the ER ordered a pelvic US which showed a cervical mass. She reports that she has not been feeling well for the last 2-3 weeks.  It has been since at least 2013 since she saw a gynecologist. She has not had a pap smear since then.  She reports regular monthly periods every 28 days.  She denies vaginal bleeding after intercourse.  No sexually active for 2-3 weeks.  She reports that she is a smoker, she smokes about a pack per day. She has been smoking for 15 years.   No past medical history on file.  Past Surgical History:  Procedure Laterality Date  . BUNIONECTOMY Right   . TONSILLECTOMY      Family History  Adopted: Yes  Family history unknown: Yes    Social History:  reports that she has been smoking cigarettes. She has a 5.00 pack-year smoking history. She has never used smokeless tobacco. She reports that she drinks alcohol. She reports that she does not use drugs.  Allergies:  Allergies  Allergen Reactions  . Amoxicillin Rash  . Penicillins Hives    Medications: I have reviewed the patient's current medications.  Results for orders placed or performed during the hospital encounter of 03/15/18 (from the past 48 hour(s))  Lipase, blood     Status: None   Collection Time: 03/15/18  2:01 PM  Result Value Ref Range   Lipase 25 11 - 51 U/L    Comment: Performed at [facility], [address] [place], [place] [address]  Comprehensive metabolic panel     Status: Abnormal   Collection Time: 03/15/18  2:01 PM  Result Value Ref Range   Sodium 139 135 - 145 mmol/L   Potassium 3.3 (L) 3.5 - 5.1 mmol/L   Chloride 106 98 - 111 mmol/L   CO2 21 (L) 22 - 32 mmol/L   Glucose, Bld 98 70 - 99 mg/dL   BUN 10 6 - 20 mg/dL    Creatinine, Ser 0.54 0.44 - 1.00 mg/dL   Calcium 9.0 8.9 - 10.3 mg/dL   Total Protein 9.5 (H) 6.5 - 8.1 g/dL   Albumin 3.5 3.5 - 5.0 g/dL   AST 12 (L) 15 - 41 U/L   ALT 10 0 - 44 U/L   Alkaline Phosphatase 92 38 - 126 U/L   Total Bilirubin 0.6 0.3 - 1.2 mg/dL   GFR calc non Af Amer >60 >60 mL/min   GFR calc Af Amer >60 >60 mL/min   Anion gap 12 5 - 15    Comment: Performed at [facility], [address] [place], [place] [address]  CBC     Status: Abnormal   Collection Time: 03/15/18  2:01 PM  Result Value Ref Range   WBC 13.8 (H) 4.0 - 10.5 K/uL   RBC 4.32 3.87 - 5.11 MIL/uL   Hemoglobin 10.8 (L) 12.0 - 15.0 g/dL   HCT 34.9 (L) 36.0 - 46.0 %   MCV 80.8 80.0 - 100.0 fL   MCH 25.0 (L) 26.0 - 34.0 pg   MCHC 30.9 30.0 - 36.0 g/dL   RDW 14.6 11.5 - 15.5 %   Platelets 742 (H) 150 - 400 K/uL   nRBC 0.0 0.0 -  0.2 %    Comment: Performed at [facility], [address] [place], [place] [address]  Urinalysis, Complete w Microscopic     Status: Abnormal   Collection Time: 03/15/18  2:01 PM  Result Value Ref Range   Color, Urine AMBER (A) YELLOW    Comment: BIOCHEMICALS MAY BE AFFECTED BY COLOR   APPearance HAZY (A) CLEAR   Specific Gravity, Urine >1.046 (H) 1.005 - 1.030   pH 5.0 5.0 - 8.0   Glucose, UA NEGATIVE NEGATIVE mg/dL   Hgb urine dipstick NEGATIVE NEGATIVE   Bilirubin Urine SMALL (A) NEGATIVE   Ketones, ur 20 (A) NEGATIVE mg/dL   Protein, ur >=300 (A) NEGATIVE mg/dL   Nitrite NEGATIVE NEGATIVE   Leukocytes, UA NEGATIVE NEGATIVE   RBC / HPF 11-20 0 - 5 RBC/hpf   WBC, UA 6-10 0 - 5 WBC/hpf   Bacteria, UA NONE SEEN NONE SEEN   Squamous Epithelial / LPF 6-10 0 - 5   Mucus PRESENT     Comment: Performed at [facility], [address] [place], [place] [address]  hCG, quantitative, pregnancy     Status: None   Collection Time: 03/15/18  2:01 PM  Result Value Ref Range   hCG, Beta Chain, Quant, S 2 <5 mIU/mL    Comment:          GEST. AGE       CONC.  (mIU/mL)   <=1 WEEK        5 - 50     2 WEEKS       50 - 500     3 WEEKS       100 - 10,000     4 WEEKS     1,000 - 30,000     5 WEEKS     3,500 - 115,000   6-8 WEEKS     12,000 - 270,000    12 WEEKS     15,000 - 220,000        FEMALE AND NON-PREGNANT FEMALE:     LESS THAN 5 mIU/mL Performed at [facility], [address] [place], [place] [address]   Pregnancy, urine POC     Status: None   Collection Time: 03/15/18  2:14 PM  Result Value Ref Range   Preg Test, Ur NEGATIVE NEGATIVE    Comment:        THE SENSITIVITY OF THIS METHODOLOGY IS >24 mIU/mL     US Pelvis Transvanginal Non-ob (tv Only)  Result Date: 03/15/2018 CLINICAL DATA:  Vaginal bleeding and pain EXAM: TRANSABDOMINAL AND TRANSVAGINAL ULTRASOUND OF PELVIS TECHNIQUE: Both transabdominal and transvaginal ultrasound examinations of the pelvis were performed. Transabdominal technique was performed for global imaging of the pelvis including uterus, ovaries, adnexal regions, and pelvic cul-de-sac. It was necessary to proceed with endovaginal exam following the transabdominal exam to visualize the uterus endometrium adnexa. COMPARISON:  None FINDINGS: Uterus Measurements: 9.6 cm length by 4.4 cm height by 4.6 cm wide = volume: 100.96 mL. Large hypoechoic mass within the cervix/lower uterine segment, this measures 5.8 x 4.6 x 5 cm and is vascular. Endometrium Thickness: 8.9 mm.  No focal abnormality visualized. Right ovary Measurements: 2.7 x 1.5 x 1.9 cm = volume: 4.1 mL. Normal appearance/no adnexal mass. Left ovary Measurements: 2.3 x 1.2 x 1.4 cm = volume: 2 mL. Normal appearance/no adnexal mass. Other findings Trace free fluid. IMPRESSION: 1. Large 5.8 cm hypoechoic vascular mass within the cervix/lower uterine segment. Gynecologic consultation is recommended.  2. Trace free fluid in the pelvis. Otherwise negative pelvic ultrasound. Electronically Signed   By: Donavan Foil M.D.   On: 03/15/2018 18:06   US  Pelvis (transabdominal Only)  Result Date: 03/15/2018 CLINICAL DATA:  Vaginal bleeding and pain EXAM: TRANSABDOMINAL AND TRANSVAGINAL ULTRASOUND OF PELVIS TECHNIQUE: Both transabdominal and transvaginal ultrasound examinations of the pelvis were performed. Transabdominal technique was performed for global imaging of the pelvis including uterus, ovaries, adnexal regions, and pelvic cul-de-sac. It was necessary to proceed with endovaginal exam following the transabdominal exam to visualize the uterus endometrium adnexa. COMPARISON:  None FINDINGS: Uterus Measurements: 9.6 cm length by 4.4 cm height by 4.6 cm wide = volume: 100.96 mL. Large hypoechoic mass within the cervix/lower uterine segment, this measures 5.8 x 4.6 x 5 cm and is vascular. Endometrium Thickness: 8.9 mm.  No focal abnormality visualized. Right ovary Measurements: 2.7 x 1.5 x 1.9 cm = volume: 4.1 mL. Normal appearance/no adnexal mass. Left ovary Measurements: 2.3 x 1.2 x 1.4 cm = volume: 2 mL. Normal appearance/no adnexal mass. Other findings Trace free fluid. IMPRESSION: 1. Large 5.8 cm hypoechoic vascular mass within the cervix/lower uterine segment. Gynecologic consultation is recommended. 2. Trace free fluid in the pelvis. Otherwise negative pelvic ultrasound. Electronically Signed   By: Donavan Foil M.D.   On: 03/15/2018 18:06    Review of Systems  Constitutional: Negative for chills, fever, malaise/fatigue and weight loss.  HENT: Negative for congestion, hearing loss and sinus pain.   Eyes: Negative for blurred vision and double vision.  Respiratory: Negative for cough, sputum production, shortness of breath and wheezing.   Cardiovascular: Negative for chest pain, palpitations, orthopnea and leg swelling.  Gastrointestinal: Negative for abdominal pain, constipation, diarrhea, nausea and vomiting.  Genitourinary: Negative for dysuria, flank pain, frequency, hematuria and urgency.  Musculoskeletal: Negative for back pain, falls and  joint pain.  Skin: Negative for itching and rash.  Neurological: Negative for dizziness and headaches.  Psychiatric/Behavioral: Negative for depression, substance abuse and suicidal ideas. The patient is not nervous/anxious.    Blood pressure (!) 142/81, pulse (!) 103, temperature 100.1 F (37.8 C), temperature source Oral, resp. rate 18, height 5\' 6"  (1.676 m), weight 108.9 kg, last menstrual period 02/22/2018, SpO2 97 %.   Physical Exam  Nursing note and vitals reviewed. Constitutional: She is oriented to person, place, and time. She appears well-developed and well-nourished.  HENT:  Head: Normocephalic and atraumatic.  Cardiovascular: Normal rate and regular rhythm.  Respiratory: Effort normal and breath sounds normal.  GI: Soft. Bowel sounds are normal.  Genitourinary:  Genitourinary Comments: Uterus mobile. No CMT.  Small blood clot present a cervical os. Friable tissue. Cervix firm.   Musculoskeletal: Normal range of motion.  Neurological: She is alert and oriented to person, place, and time.  Skin: Skin is warm and dry.  Psychiatric: She has a normal mood and affect. Her behavior is normal. Judgment and thought content normal.    Assessment/Plan: 32 yo with a cervical mass. Possible cervical dysplasia or cancer. Will have Raelynne see me in clinic on 03/17/18 for a pap smear and cervical biopsies. Discussed with patient and her family that this may be cervical cancer and that follow up was very important. Advised her to stop smoking.    R  03/15/2018, 10:13 PM

## 2018-03-15 NOTE — ED Triage Notes (Signed)
Patient presents to the ED with left lower quadrant abdominal pain and vaginal bleeding x 3 weeks.  Patient states bleeding and pain have been much worse for the past 3 days.  Patient reports a negative pregnancy test last week.  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  Patient states, "I'm not bleeding regularly but randomly, I'm peeing and passing clots.  Patient denies having to frequently change pads and tampons.

## 2018-03-15 NOTE — ED Notes (Signed)
Patient transported to Ultrasound 

## 2018-03-15 NOTE — Telephone Encounter (Signed)
Agree with recommendation. Has a history of DUB and was treated by Dr. Kenton Kingfisher then Dr. Enzo Bi.

## 2018-03-15 NOTE — ED Provider Notes (Signed)
[facility] Emergency Department Provider Note  ____________________________________________  Time seen: Approximately 6:57 PM  I have reviewed the triage vital signs and the nursing notes.   HISTORY  Chief Complaint Abdominal Pain and Vaginal Bleeding   HPI Kristy Gordon is a 32 y.o. female with a history of several prior miscarriages and a live birth who presents for evaluation of vaginal bleeding and abdominal pain.  Patient reports 3 weeks of intermittent vaginal bleeding which she describes as passing clots intermittently throughout the day.  Also has had a constant cramping pain in the left lower quadrant for the last 3 weeks currently 5/10.  No fever or chills, no dizziness, no nausea or vomiting, no dysuria or hematuria.  No prior abdominal surgeries.  Patient is adopted and has unknown family history.   Patient Active Problem List   Diagnosis Date Noted  . Anxiety 09/05/2015  . Clinical depression 09/05/2015  . Big thyroid 09/05/2015  . Cannot sleep 09/05/2015  . Adiposity 09/05/2015  . 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 Current tobacco use 09/05/2015    Past Surgical History:  Procedure Laterality Date  . BUNIONECTOMY Right   . TONSILLECTOMY      Prior to Admission medications   Medication Sig Start Date End Date Taking? Authorizing Provider  ALPRAZolam [name]) 0.5 MG tablet Take 1 tablet (0.5 mg total) by mouth 3 (three) times daily as needed for anxiety. 03/08/18  Yes Chrismon, Vickki Muff, PA  levothyroxine (SYNTHROID, LEVOTHROID) 112 MCG tablet Take 2 tablets (224 mcg total) by mouth daily. 02/22/18  Yes [name], MD  citalopram (CELEXA) 40 MG tablet Take 1 tablet (40 mg total) by mouth daily. Patient not taking: Reported on 03/15/2018 03/08/18   Chrismon, Vickki Muff, PA    Allergies Amoxicillin and Penicillins  Family History  Adopted: Yes  Family history  unknown: Yes    Social History Social History   Tobacco Use  . Smoking status: Current Every Day Smoker    Packs/day: 1.00    Years: 5.00    Pack years: 5.00    Types: Cigarettes  . Smokeless tobacco: Never Used  Substance Use Topics  . Alcohol use: Yes    Comment: rare  . Drug use: No    Review of Systems  Constitutional: Negative for fever. Eyes: Negative for visual changes. ENT: Negative for sore throat. Neck: No neck pain  Cardiovascular: Negative for chest pain. Respiratory: Negative for shortness of breath. Gastrointestinal: + LLQ abdominal pain. No vomiting or diarrhea. Genitourinary: Negative for dysuria. + vaginal bleeding Musculoskeletal: Negative for back pain. Skin: Negative for rash. Neurological: Negative for headaches, weakness or numbness. Psych: No SI or HI  ____________________________________________   PHYSICAL EXAM:  VITAL SIGNS: Vitals:   03/15/18 1756 03/15/18 1758  BP: 122/76   Pulse: 94 94  Resp:    Temp:    SpO2: 98% 99%   Constitutional: Alert and oriented. Well appearing and in no apparent distress. HEENT:      Head: Normocephalic and atraumatic.         Eyes: Conjunctivae are normal. Sclera is non-icteric.       Mouth/Throat: Mucous membranes are moist.       Neck: Supple with no signs of meningismus. Cardiovascular: Regular rate and rhythm. No murmurs, gallops, or rubs. 2+ symmetrical distal pulses are present in all extremities. No JVD. Respiratory: Normal respiratory effort. Lungs are clear to auscultation  bilaterally. No wheezes, crackles, or rhonchi.  Gastrointestinal: Soft, non tender, and non distended with positive bowel sounds. No rebound or guarding. Pelvic exam: Normal external genitalia, no rashes or lesions. Normal cervical mucus. There is a lesion involving the os which has small amount of bleeding from it. No cervical motion tenderness.  No uterine or adnexal tenderness.   Musculoskeletal: Nontender with normal range  of motion in all extremities. No edema, cyanosis, or erythema of extremities. Neurologic: Normal speech and language. Face is symmetric. Moving all extremities. No gross focal neurologic deficits are appreciated. Skin: Skin is warm, dry and intact. No rash noted. Psychiatric: Mood and affect are normal. Speech and behavior are normal.  ____________________________________________   LABS (all labs ordered are listed, but only abnormal results are displayed)  Labs Reviewed  COMPREHENSIVE METABOLIC PANEL - Abnormal; Notable for the following components:      Result Value   Potassium 3.3 (*)    CO2 21 (*)    Total Protein 9.5 (*)    AST 12 (*)    All other components within normal limits  CBC - Abnormal; Notable for the following components:   WBC 13.8 (*)    Hemoglobin 10.8 (*)    HCT 34.9 (*)    MCH 25.0 (*)    Platelets 742 (*)    All other components within normal limits  URINALYSIS, COMPLETE (UACMP) WITH MICROSCOPIC - Abnormal; Notable for the following components:   Color, Urine AMBER (*)    APPearance HAZY (*)    Specific Gravity, Urine >1.046 (*)    Bilirubin Urine SMALL (*)    Ketones, ur 20 (*)    Protein, ur >=300 (*)    All other components within normal limits  LIPASE, BLOOD  HCG, QUANTITATIVE, PREGNANCY  POCT PREGNANCY, URINE  POC URINE PREG, ED   ____________________________________________  EKG  none  ____________________________________________  RADIOLOGY  I have personally reviewed the images performed during this visit and I agree with the Radiologist's read.   Interpretation by Radiologist:  US Pelvis Transvanginal Non-ob (tv Only)  Result Date: 03/15/2018 CLINICAL DATA:  Vaginal bleeding and pain EXAM: TRANSABDOMINAL AND TRANSVAGINAL ULTRASOUND OF PELVIS TECHNIQUE: Both transabdominal and transvaginal ultrasound examinations of the pelvis were performed. Transabdominal technique was performed for global imaging of the pelvis including uterus,  ovaries, adnexal regions, and pelvic cul-de-sac. It was necessary to proceed with endovaginal exam following the transabdominal exam to visualize the uterus endometrium adnexa. COMPARISON:  None FINDINGS: Uterus Measurements: 9.6 cm length by 4.4 cm height by 4.6 cm wide = volume: 100.96 mL. Large hypoechoic mass within the cervix/lower uterine segment, this measures 5.8 x 4.6 x 5 cm and is vascular. Endometrium Thickness: 8.9 mm.  No focal abnormality visualized. Right ovary Measurements: 2.7 x 1.5 x 1.9 cm = volume: 4.1 mL. Normal appearance/no adnexal mass. Left ovary Measurements: 2.3 x 1.2 x 1.4 cm = volume: 2 mL. Normal appearance/no adnexal mass. Other findings Trace free fluid. IMPRESSION: 1. Large 5.8 cm hypoechoic vascular mass within the cervix/lower uterine segment. Gynecologic consultation is recommended. 2. Trace free fluid in the pelvis. Otherwise negative pelvic ultrasound. Electronically Signed   By: Donavan Foil M.D.   On: 03/15/2018 18:06   US Pelvis (transabdominal Only)  Result Date: 03/15/2018 CLINICAL DATA:  Vaginal bleeding and pain EXAM: TRANSABDOMINAL AND TRANSVAGINAL ULTRASOUND OF PELVIS TECHNIQUE: Both transabdominal and transvaginal ultrasound examinations of the pelvis were performed. Transabdominal technique was performed for global imaging of the pelvis including uterus,  ovaries, adnexal regions, and pelvic cul-de-sac. It was necessary to proceed with endovaginal exam following the transabdominal exam to visualize the uterus endometrium adnexa. COMPARISON:  None FINDINGS: Uterus Measurements: 9.6 cm length by 4.4 cm height by 4.6 cm wide = volume: 100.96 mL. Large hypoechoic mass within the cervix/lower uterine segment, this measures 5.8 x 4.6 x 5 cm and is vascular. Endometrium Thickness: 8.9 mm.  No focal abnormality visualized. Right ovary Measurements: 2.7 x 1.5 x 1.9 cm = volume: 4.1 mL. Normal appearance/no adnexal mass. Left ovary Measurements: 2.3 x 1.2 x 1.4 cm =  volume: 2 mL. Normal appearance/no adnexal mass. Other findings Trace free fluid. IMPRESSION: 1. Large 5.8 cm hypoechoic vascular mass within the cervix/lower uterine segment. Gynecologic consultation is recommended. 2. Trace free fluid in the pelvis. Otherwise negative pelvic ultrasound. Electronically Signed   By: Donavan Foil M.D.   On: 03/15/2018 18:06     ____________________________________________   PROCEDURES  Procedure(s) performed: None Procedures Critical Care performed:  None ____________________________________________   INITIAL IMPRESSION / ASSESSMENT AND PLAN / ED COURSE  32 y.o. female with a history of several prior miscarriages and a live birth who presents for evaluation of vaginal bleeding and abdominal pain x 3 weeks. HD stable.  Hemoglobin of 10.8.  Previous labs from 2017 showing hemoglobin of 14.7.  Ultrasound concerning for a large hypervascular mass at the cervix which is seen on pelvic exam.  Discussed with Dr. Gilman Schmidt from [facility] who will evaluate patient in the ED. Care transferred to Dr. Kerman Passey      As part of my medical decision making, I reviewed the following data within the [facility] notes reviewed and incorporated, Labs reviewed , Old chart reviewed, Radiograph reviewed , A consult was requested and obtained from this/these consultant(s) ObGYN, Notes from prior ED visits and  Controlled Substance Database    Pertinent labs & imaging results that were available during my care of the patient were reviewed by me and considered in my medical decision making (see chart for details).    ____________________________________________   FINAL CLINICAL IMPRESSION(S) / ED DIAGNOSES  Final diagnoses:  Vaginal bleeding  Mass of uterine cervix  Anemia, unspecified type      NEW MEDICATIONS STARTED DURING THIS VISIT:  ED Discharge Orders    None       Note:  This document was prepared using Dragon voice  recognition software and may include unintentional dictation errors.    Rudene Re, MD 03/15/18 2103

## 2018-03-15 NOTE — ED Provider Notes (Signed)
-----------------------------------------   10:36 PM on 03/15/2018 -----------------------------------------  Patient care assumed from Dr. Alfred Levins.  Dr. Nechama Guard has seen the patient in the emergency department, will follow up with the patient on Thursday in the office to discuss further work-up.  Patient agreeable to plan of care.  We will discharge with pain medication and OB follow-up.  Patient has borderline febrile, discussed this with the patient as well as return precautions for fever 101 or higher or worsening abdominal pain.  Patient is out of her anxiety medication, I will write a short course of Ativan as well as a short course of Norco.  I had a discussion with the patient and her husband about not taking these medications simultaneously, she needs to take 1 or the other as this can cause respiratory depression.  They are agreeable and understand.  Patient will follow-up on Thursday with OB.   Harvest Dark, MD 03/15/18 2242

## 2018-03-16 ENCOUNTER — Other Ambulatory Visit: Payer: Self-pay | Admitting: Family Medicine

## 2018-03-16 ENCOUNTER — Telehealth: Payer: Self-pay | Admitting: Obstetrics and Gynecology

## 2018-03-16 DIAGNOSIS — F410 Panic disorder [episodic paroxysmal anxiety] without agoraphobia: Secondary | ICD-10-CM

## 2018-03-16 MED ORDER — ALPRAZOLAM 0.5 MG PO TABS: 0.5000 mg | ORAL_TABLET | Freq: Three times a day (TID) | ORAL | 1 refills | Status: DC | PRN

## 2018-03-16 NOTE — Telephone Encounter (Signed)
-----   Message from Homero Fellers, MD sent at 03/15/2018 10:11 PM EST ----- Please add this person onto my schedule for a 1:15 appointment on Thursday.  Thank you,  Dr. Gilman Schmidt

## 2018-03-16 NOTE — Telephone Encounter (Signed)
Xanax 0.5 mg BID prn,#60,1rf. Do not let husband near this medicine.

## 2018-03-16 NOTE — Telephone Encounter (Signed)
Patient was seen at the ER yesterday for passing big clots vaginally and they found a mass in her cervix ("thinks this is cancer") and wants a refill on Alprazolam  0.5 mg.   She is a Production assistant, radio.   An ultrasound was done at the ER if you want to verify this.  She said they gave her some "anxiety" medicine but she is not having that RX filled because "it doesn't help her".   She uses [facility] on [address].

## 2018-03-16 NOTE — Telephone Encounter (Signed)
Patient is schedule 1:15 03/17/18

## 2018-03-16 NOTE — Telephone Encounter (Signed)
Please advise 

## 2018-03-17 ENCOUNTER — Telehealth: Payer: Self-pay

## 2018-03-17 ENCOUNTER — Encounter: Payer: Self-pay | Admitting: Obstetrics and Gynecology

## 2018-03-17 ENCOUNTER — Ambulatory Visit (INDEPENDENT_AMBULATORY_CARE_PROVIDER_SITE_OTHER): Payer: Self-pay | Admitting: Obstetrics and Gynecology

## 2018-03-17 VITALS — BP 140/98 | Ht 67.0 in | Wt 240.0 lb

## 2018-03-17 DIAGNOSIS — Z124 Encounter for screening for malignant neoplasm of cervix: Secondary | ICD-10-CM

## 2018-03-17 DIAGNOSIS — Z8759 Personal history of other complications of pregnancy, childbirth and the puerperium: Secondary | ICD-10-CM

## 2018-03-17 DIAGNOSIS — N888 Other specified noninflammatory disorders of cervix uteri: Secondary | ICD-10-CM

## 2018-03-17 DIAGNOSIS — E063 Autoimmune thyroiditis: Secondary | ICD-10-CM

## 2018-03-17 DIAGNOSIS — E038 Other specified hypothyroidism: Secondary | ICD-10-CM

## 2018-03-17 DIAGNOSIS — D069 Carcinoma in situ of cervix, unspecified: Secondary | ICD-10-CM

## 2018-03-17 NOTE — Telephone Encounter (Signed)
Pt seen today; wants to know if it's okay to use heating and tylenol instead of hydrocodone.  [phone]

## 2018-03-17 NOTE — Telephone Encounter (Signed)
Yes she can use heating and tylenol.  Thank you, Dr. Gilman Schmidt

## 2018-03-18 ENCOUNTER — Other Ambulatory Visit (HOSPITAL_COMMUNITY)
Admission: RE | Admit: 2018-03-18 | Discharge: 2018-03-18 | Disposition: A | Discharge status: Home / Self Care | Payer: Self-pay | Source: Ambulatory Visit | Attending: Obstetrics and Gynecology | Admitting: Obstetrics and Gynecology

## 2018-03-18 DIAGNOSIS — Z124 Encounter for screening for malignant neoplasm of cervix: Secondary | ICD-10-CM | POA: Insufficient documentation

## 2018-03-18 DIAGNOSIS — E038 Other specified hypothyroidism: Secondary | ICD-10-CM | POA: Insufficient documentation

## 2018-03-18 DIAGNOSIS — N888 Other specified noninflammatory disorders of cervix uteri: Secondary | ICD-10-CM | POA: Insufficient documentation

## 2018-03-18 DIAGNOSIS — Z8759 Personal history of other complications of pregnancy, childbirth and the puerperium: Secondary | ICD-10-CM | POA: Insufficient documentation

## 2018-03-18 DIAGNOSIS — E063 Autoimmune thyroiditis: Secondary | ICD-10-CM

## 2018-03-18 NOTE — Progress Notes (Signed)
Patient ID: Kristy Gordon, female   DOB: 03-13-1986, 32 y.o.   MRN: [id number]  Reason for Consult: Follow-up (Saw Dr.Schuman, Pain 3 or 4/10)   Referred by [name],*  Subjective:     HPI:  Kristy Gordon is a 32 y.o. female . She is following up today from a recent ER visit for cervical biopsy and pap smear. [place] at the ER suggested a 5 cm cervical mass. She has been doing okay. Persistent bleeding since her ER visit. She has mild left sided pain. Pain was her initial reason for presentation to the ER.    Obstetrical History G9 P0-1-8-0 She reports that she has had at least 8 miscarriages.  Perhaps more.  But she is unsure.  Her and her partner state that they were at least 8 times where there seemed to be definitive evidence that there was an early pregnancy that had passed.  She and her partner have been trying to conceive for 6 years.  She has not had a formal infertility consultation. In 2011 she had a preterm birth of a 24-week infant and was seen at [facility] during that time for severe preeclampsia related to her pregnancy.   Gynecological History She does not have record of a Pap smear in [place] or [place].  She is unsure when she would have last had a Pap smear, guesses that it may have been in 2012.  She denies any history of procedures to the cervix such as LEEP or cone.  She has no history of uterine fibroids or ovarian cyst.  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  She reports a history of genital warts.  Her partner who is present today in the office reports that his past fianc had a history of HPV and abnormal Pap smears.  She reports that she initiated sexual intercourse at 33.  She reports less than 10 lifetime sexual partners.   She entered menarche at 24 to 32 years old.  She reports that she has a history of irregular periods.  She says that she generally has 12 periods in a month but the periods will occur at random times of the month.  She  says that she normally has 3 to 5 weeks between her periods and this is been a normal pattern since she began having periods.  Her periods generally last 4 to 5 days.  She has some dysmenorrhea.  She used NuvaRing and oral contraceptives in the past for about 6 years total.  She denies any bleeding after intercourse.   Past Medical History:  Diagnosis Date  . Anxiety   . Hashimoto's thyroiditis   . Hypothyroid    Family History  Adopted: Yes  Family history unknown: Yes   Past Surgical History:  Procedure Laterality Date  . BUNIONECTOMY Right   . TONSILLECTOMY      Short Social History:  Social History   Tobacco Use  . Smoking status: Current Every Day Smoker    Packs/day: 1.00    Years: 5.00    Pack years: 5.00    Types: Cigarettes  . Smokeless tobacco: Never Used  Substance Use Topics  . Alcohol use: Yes    Comment: rare   She reports that she has been smoking since the age of 75.  She says that she stopped briefly between the ages of 40 and 37.  At 19 she started smoking from that age until now.  She smokes about 1.5  packs/day.  At my urging she has greatly reduced this number since her ER visit this week.    Allergies  Allergen Reactions  . Amoxicillin Rash  . Penicillins Hives    Current Outpatient Medications  Medication Sig Dispense Refill  . ALPRAZolam (XANAX) 0.5 MG tablet Take 1 tablet (0.5 mg total) by mouth 3 (three) times daily as needed for anxiety. 60 tablet 1  . levothyroxine (SYNTHROID, LEVOTHROID) 112 MCG tablet Take 2 tablets (224 mcg total) by mouth daily. 60 tablet 5  . citalopram (CELEXA) 40 MG tablet Take 1 tablet (40 mg total) by mouth daily. (Patient not taking: Reported on 03/15/2018)    . HYDROcodone-acetaminophen (NORCO/VICODIN) 5-325 MG tablet Take 1 tablet by mouth every 4 (four) hours as needed. (Patient not taking: Reported on 03/17/2018) 15 tablet 0  . LORazepam (ATIVAN) 0.5 MG tablet Take 1 tablet (0.5 mg total) by mouth every 8  (eight) hours as needed for anxiety. (Patient not taking: Reported on 03/17/2018) 30 tablet 0   No current facility-administered medications for this visit.     Review of Systems  Constitutional: Negative for chills, fatigue, fever and unexpected weight change.  HENT: Negative for trouble swallowing.  Eyes: Negative for loss of vision.  Respiratory: Negative for cough, shortness of breath and wheezing.  Cardiovascular: Negative for chest pain, leg swelling, palpitations and syncope.  GI: Negative for abdominal pain, blood in stool, diarrhea, nausea and vomiting.  GU: Negative for difficulty urinating, dysuria, frequency and hematuria.  Musculoskeletal: Negative for back pain, leg pain and joint pain.  Skin: Negative for rash.  Neurological: Negative for dizziness, headaches, light-headedness, numbness and seizures.  Psychiatric: Negative for behavioral problem, confusion, depressed mood and sleep disturbance.        Objective:  Objective   Vitals:   03/17/18 1319  BP: (!) 140/98  Weight: 240 lb (108.9 kg)  Height: 5\' 7"  (1.702 m)   Body mass index is 37.59 kg/m.  Physical Exam Vitals signs and nursing note reviewed.  Constitutional:      Appearance: She is well-developed.  HENT:     Head: Normocephalic and atraumatic.  Eyes:     Pupils: Pupils are equal, round, and reactive to light.  Neck:     Musculoskeletal: Normal range of motion and neck supple.     Thyroid: Thyromegaly present.  Cardiovascular:     Rate and Rhythm: Normal rate and regular rhythm.  Pulmonary:     Effort: Pulmonary effort is normal. No respiratory distress.  Abdominal:     General: Abdomen is flat. There is no distension.     Palpations: Abdomen is soft.     Tenderness: There is no abdominal tenderness. There is no guarding or rebound.     Hernia: No hernia is present.  Genitourinary:    Rectum: Normal.        Comments: Cervix difficult to visualize because of pooling of blood in the  vagina. Biopsies and pap smear obtained.   Bi manual exam: mobile midline uterus approx 8cm in size. Firm cervix. No appreciable extension of the cervical mass to the parametrium or the pelvic side wall.   Visualization limited from blood but involvement of the lower 1/3 of the vagina not seen.   Normal rectal examination. No thickening of the rectovaginal wall.   No swollen pelvic, axillary, or cervical lymph nodes appreciated.  Lymphadenopathy:     Head:     Right side of head: No submandibular adenopathy.  Left side of head: No submandibular adenopathy.     Cervical: No cervical adenopathy.     Upper Body:     Right upper body: No supraclavicular or axillary adenopathy.     Left upper body: No supraclavicular or axillary adenopathy.     Lower Body: No right inguinal adenopathy. No left inguinal adenopathy.  Skin:    General: Skin is warm and dry.  Neurological:     Mental Status: She is alert and oriented to person, place, and time.  Psychiatric:        Behavior: Behavior normal.        Thought Content: Thought content normal.        Judgment: Judgment normal.        Assessment/Plan:    32 yo with a cervical mass. Pap smear and Cervical biopsies at 3,6,and 9 o'clock performed today.  Concern for cervical cancer given cervical appearance and mass seen on ultrasound. Will have her follow up next week for referral if needed to gynecologic oncology. Patient and her partner provided with patient information about cervical cancer from UpToDate.   More than 50 minutes were spent face to face with the patient in the room with more than 50% of the time spent providing counseling and discussing the plan of management.      Adrian Prows MD [facility], [facility] 03/21/18 1:15 PM

## 2018-03-18 NOTE — Telephone Encounter (Signed)
Pt aware.

## 2018-03-21 ENCOUNTER — Other Ambulatory Visit: Payer: Self-pay | Admitting: Obstetrics and Gynecology

## 2018-03-21 ENCOUNTER — Encounter: Payer: Self-pay | Admitting: Obstetrics and Gynecology

## 2018-03-21 ENCOUNTER — Ambulatory Visit (INDEPENDENT_AMBULATORY_CARE_PROVIDER_SITE_OTHER): Payer: Self-pay | Admitting: Obstetrics and Gynecology

## 2018-03-21 VITALS — BP 144/88 | HR 114 | Ht 67.0 in | Wt 239.0 lb

## 2018-03-21 DIAGNOSIS — C539 Malignant neoplasm of cervix uteri, unspecified: Secondary | ICD-10-CM

## 2018-03-21 DIAGNOSIS — N888 Other specified noninflammatory disorders of cervix uteri: Secondary | ICD-10-CM

## 2018-03-21 DIAGNOSIS — C538 Malignant neoplasm of overlapping sites of cervix uteri: Secondary | ICD-10-CM

## 2018-03-21 NOTE — Progress Notes (Signed)
Patient ID: Kristy Gordon, female   DOB: [birth year], 32 y.o.   MRN: [id number]  Reason for Consult: Follow-up (No bleeding, pain is better)   Referred by [name],*  Subjective:     HPI:  Kristy Gordon is a 32 y.o. female she is following up today to discuss the biopsy results from her office visit Thursday.   Past Medical History:  Diagnosis Date  . Anxiety   . Hashimoto's thyroiditis   . Hypothyroid    Family History  Adopted: Yes  Family history unknown: Yes   Past Surgical History:  Procedure Laterality Date  . BUNIONECTOMY Right   . TONSILLECTOMY      Short Social History:  Social History   Tobacco Use  . Smoking status: Current Every Day Smoker    Packs/day: 1.00    Years: 5.00    Pack years: 5.00    Types: Cigarettes  . Smokeless tobacco: Never Used  Substance Use Topics  . Alcohol use: Yes    Comment: rare    Allergies  Allergen Reactions  . Amoxicillin Rash  . Penicillins Hives    Current Outpatient Medications  Medication Sig Dispense Refill  . ALPRAZolam (XANAX) 0.5 MG tablet Take 1 tablet (0.5 mg total) by mouth 3 (three) times daily as needed for anxiety. 60 tablet 1  . levothyroxine (SYNTHROID, LEVOTHROID) 112 MCG tablet Take 2 tablets (224 mcg total) by mouth daily. 60 tablet 5  . citalopram (CELEXA) 40 MG tablet Take 1 tablet (40 mg total) by mouth daily.    Marland Kitchen HYDROcodone-acetaminophen (NORCO/VICODIN) 5-325 MG tablet Take 1 tablet by mouth every 4 (four) hours as needed. (Patient not taking: Reported on 03/17/2018) 15 tablet 0  . LORazepam (ATIVAN) 0.5 MG tablet Take 1 tablet (0.5 mg total) by mouth every 8 (eight) hours as needed for anxiety. 30 tablet 0   No current facility-administered medications for this visit.     Review of Systems  Constitutional: Negative for chills, fatigue, fever and unexpected weight change.  HENT: Negative for trouble swallowing.  Eyes: Negative for loss of vision.  Respiratory: Negative for  cough, shortness of breath and wheezing.  Cardiovascular: Negative for chest pain, leg swelling, palpitations and syncope.  GI: Negative for abdominal pain, blood in stool, diarrhea, nausea and vomiting.  GU: Negative for difficulty urinating, dysuria, frequency and hematuria.  Musculoskeletal: Negative for back pain, leg pain and joint pain.  Skin: Negative for rash.  Neurological: Negative for dizziness, headaches, light-headedness, numbness and seizures.  Psychiatric: Negative for behavioral problem, confusion, depressed mood and sleep disturbance.        Objective:  Objective   Vitals:   03/21/18 1340  BP: (!) 144/88  Pulse: (!) 114  Weight: 239 lb (108.4 kg)  Height: 5\' 7"  (1.702 m)   Body mass index is 37.43 kg/m.  Physical Exam Constitutional:      Appearance: Normal appearance.  Neurological:     Mental Status: She is alert and oriented to person, place, and time.  Psychiatric:        Mood and Affect: Mood normal.        Behavior: Behavior normal.        Thought Content: Thought content normal.        Judgment: Judgment normal.        Assessment/Plan:     32 yo with a 5 cm cervical mass  Discussed pending pathology result of adenocarcinoma which was relayed to be over  the phone by the pathologist. Will likely have full report tomorrow.  Questions answered.  Made a referral for patient to Gynecological Oncology for management of cervical cancer.  They will see her Wednesday at 1pm in office.   More than 25 minutes were spent face to face with the patient in the room with more than 50% of the time spent providing counseling and discussing the plan of management.Adrian Prows MD [facility] 03/21/18 2:10 PM

## 2018-03-23 ENCOUNTER — Inpatient Hospital Stay: Payer: Self-pay

## 2018-03-23 ENCOUNTER — Other Ambulatory Visit: Payer: Self-pay

## 2018-03-23 ENCOUNTER — Inpatient Hospital Stay: Payer: Self-pay | Attending: Obstetrics and Gynecology | Admitting: Obstetrics and Gynecology

## 2018-03-23 VITALS — BP 128/85 | HR 103 | Temp 98.5°F | Resp 16 | Ht 67.0 in | Wt 238.1 lb

## 2018-03-23 DIAGNOSIS — C539 Malignant neoplasm of cervix uteri, unspecified: Secondary | ICD-10-CM | POA: Insufficient documentation

## 2018-03-23 DIAGNOSIS — F17210 Nicotine dependence, cigarettes, uncomplicated: Secondary | ICD-10-CM | POA: Insufficient documentation

## 2018-03-23 DIAGNOSIS — D4959 Neoplasm of unspecified behavior of other genitourinary organ: Secondary | ICD-10-CM

## 2018-03-23 NOTE — Progress Notes (Signed)
Chaperoned pelvic exam. Educated further on PET date/time/instructions. Appointments have been arranged with Dr. Janese Banks and Dr. Baruch Gouty. Date and times given. She will have HIV testing today as recommended by NCCN guidelines. Gyn Onc f/u pending outcome of PET and treatment plan. Oncology Nurse Navigator Documentation  Navigator Location: [facility] (03/23/18 1400)   )Navigator Encounter Type: Initial GynOnc (03/23/18 1400)                     Patient Visit Type: GynOnc (03/23/18 1400)                              Time Spent with Patient: 30 (03/23/18 1400)

## 2018-03-23 NOTE — Progress Notes (Signed)
Patient here for initial visit. She reports very irregular periods, lower left abdominal pain, night sweats accompanied by low grade. She has had multiple miscarriages one still born.

## 2018-03-23 NOTE — Progress Notes (Signed)
Gynecologic Oncology Consult Visit   Referring Provider: Dr Gilman Schmidt  Chief Concern: cervical cancer  Subjective:  Kristy Gordon is a 32 y.o. G80 female who is seen in consultation from Dr. Gilman Schmidt.  She presented to the ER 03/15/18 for sharp right sided abdominal pain that feels like uterine cramps. This pain had been present for about 2 weeks.  She reported irregular bleeding and the ER ordered a pelvic US which showed a cervical mass.She reports that she has not been feeling well for the last 2-3 weeks. It has been since at least 2013 since she saw a gynecologist. She has not had a pap smear since then. She reports regular monthly periods every 28 days. She denies vaginal bleeding after intercourse.  No sexually active for 2-3 weeks.    Seen by Dr Gilman Schmidt 12/13 in office and and cervix firm and friable.  Multiple biopsies showed adenocarcinoma. PAP pending. States today that she has pain in left pelvis.   She reports that she is a smoker, she smokes about a pack per day. She has been smoking for 15 years.   Problem List: Patient Active Problem List   Diagnosis Date Noted  . Hypothyroidism due to Hashimoto's thyroiditis 03/18/2018  . H/O fetal demise, not currently pregnant 03/18/2018  . Anxiety 09/05/2015  . Clinical depression 09/05/2015  . Big thyroid 09/05/2015  . Cannot sleep 09/05/2015  . Adiposity 09/05/2015  . 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 Current tobacco use 09/05/2015    Past Medical History: Past Medical History:  Diagnosis Date  . Anxiety   . Hashimoto's thyroiditis   . Hypothyroid     Past Surgical History: Past Surgical History:  Procedure Laterality Date  . BUNIONECTOMY Right   . TONSILLECTOMY      OB History:  OB History  Gravida Para Term Preterm AB Living  10 2   2 8  0  SAB TAB Ectopic Multiple Live Births  8       0    # Outcome Date GA Lbr Len/2nd Weight Sex Delivery Anes PTL Lv   10 SAB           9 SAB           8 SAB           7 SAB           6 SAB           5 SAB           4 SAB           3 SAB           2 Preterm      Vag-Spont   FD  1 Preterm      Vag-Spont   FD    Family History: Family History  Adopted: Yes  Family history unknown: Yes    Social History: Social History   Socioeconomic History  . Marital status: Married    Spouse name: Not on file  . Number of children: Not on file  . Years of education: Not on file  . Highest education level: Not on file  Occupational History  . Not on file  Social Needs  . Financial resource strain: Not on file  . Food insecurity:    Worry: Not on file    Inability: Not on file  . Transportation needs:    Medical: Not on file  Non-medical: Not on file  Tobacco Use  . Smoking status: Current Every Day Smoker    Packs/day: 1.00    Years: 5.00    Pack years: 5.00    Types: Cigarettes  . Smokeless tobacco: Never Used  Substance and Sexual Activity  . Alcohol use: Yes    Comment: rare  . Drug use: No  . Sexual activity: Yes    Birth control/protection: None  Lifestyle  . Physical activity:    Days per week: Not on file    Minutes per session: Not on file  . Stress: Not on file  Relationships  . Social connections:    Talks on phone: Not on file    Gets together: Not on file    Attends religious service: Not on file    Active member of club or organization: Not on file    Attends meetings of clubs or organizations: Not on file    Relationship status: Not on file  . Intimate partner violence:    Fear of current or ex partner: Not on file    Emotionally abused: Not on file    Physically abused: Not on file    Forced sexual activity: Not on file  Other Topics Concern  . Not on file  Social History Narrative  . Not on file    Allergies: Allergies  Allergen Reactions  . Amoxicillin Rash  . Penicillins Hives    Current Medications: Current Outpatient Medications  Medication Sig  Dispense Refill  . ALPRAZolam (XANAX) 0.5 MG tablet Take 1 tablet (0.5 mg total) by mouth 3 (three) times daily as needed for anxiety. 60 tablet 1  . citalopram (CELEXA) 40 MG tablet Take 1 tablet (40 mg total) by mouth daily.    Marland Kitchen levothyroxine (SYNTHROID, LEVOTHROID) 112 MCG tablet Take 2 tablets (224 mcg total) by mouth daily. 60 tablet 5  . LORazepam (ATIVAN) 0.5 MG tablet Take 1 tablet (0.5 mg total) by mouth every 8 (eight) hours as needed for anxiety. 30 tablet 0  . HYDROcodone-acetaminophen (NORCO/VICODIN) 5-325 MG tablet Take 1 tablet by mouth every 4 (four) hours as needed. (Patient not taking: Reported on 03/17/2018) 15 tablet 0   No current facility-administered medications for this visit.     Review of Systems General: negative for, fevers, chills, fatigue, changes in sleep, changes in weight or appetite Skin: negative for changes in color, texture, moles or lesions Eyes: negative for, changes in vision, pain, diplopia HEENT: negative for, change in hearing, pain, discharge, tinnitus, vertigo, voice changes, sore throat, neck masses Breasts: negative for breast lumps Pulmonary: negative for, dyspnea, orthopnea, productive cough Cardiac: negative for, palpitations, syncope, pain, discomfort, pressure Gastrointestinal: negative for, dysphagia, nausea, vomiting, jaundice, pain, constipation, diarrhea, hematemesis, hematochezia Genitourinary/Sexual: negative for, dysuria, discharge, hesitancy, nocturia, retention, stones, infections, STD's, incontinence Musculoskeletal: negative for, pain, stiffness, swelling, range of motion limitation Hematology: negative for, easy bruising, bleeding Neurologic/Psych: negative for, headaches, seizures, paralysis, weakness, tremor, change in gait, change in sensation, mood swings, change in memory  Objective:  Physical Examination:  Resp 16   Ht 5\' 7"  (1.702 m)   Wt 238 lb 1.6 oz (108 kg)   LMP 02/22/2018 (Approximate)   BMI 37.29 kg/m     ECOG Performance Status: 1 - Symptomatic but completely ambulatory  General appearance: alert, cooperative and appears stated age HEENT:PERRLA, thyroid without masses and trachea midline Lymph node survey: non-palpable, axillary, inguinal, supraclavicular Cardiovascular: regular rate and rhythm, no murmurs or gallops Respiratory: normal air entry,  lungs clear to auscultation and no rales, rhonchi or wheezing Breast exam: not examined. Abdomen: normal, no masses tenderness or organomegaly. Back: inspection of back is normal Extremities: extremities normal, atraumatic, no cyanosis or edema Skin exam - normal coloration and turgor, no rashes, no suspicious skin lesions noted. Neurological exam reveals alert, oriented, normal speech, no focal findings or movement disorder noted.  Pelvic: exam chaperoned by nurse;  Vulva: normal appearing vulva with no masses, tenderness or lesions; Vagina: normal vagina; Adnexa: normal adnexa in size, nontender and no masses; Uterus: uterus is normal size, shape, consistency and nontender; Cervix: anteverted;replaced by tumor and with extension of cancer into the left parametria, possibly to PSW.  Rectal: confirms     Assessment:  Rashan A Leavy is a 32 y.o. female diagnosed with at least stage IIB cervical adenocarcinoma.  Cancer is replacing the entire cervix and she is having pain in the left pelvis and could have ureteral obstruction.   Medical co-morbidities complicating care: anxiety, depression, thyroiditis, smoker.  Plan:   Problem List Items Addressed This Visit    None    Visit Diagnoses    Malignant neoplasm of cervix, unspecified site ([place])    -  Primary     We discussed options for evaluation and management including PET/CT scan to better assess extent of disease for treatment planning.  I told the patient and family that most likely we will recommend chemoradiation and that this has curative potential if the cancer is localized to the pelvis  and lymph nodes.  The disease is clearly too locally advanced for radical hysterectomy due to left parametrial involvement.  If more distant disease was found on PET then combination chemotherapy with carbo/traxol/bev may be the best first line therapy.  Discussed that prognosis would be better assessed after PET scan results are available.    We discussed that preservation of fertility is not possible with radiation treatment.  She lost several pregnancies and does not have any children, but no longer desires fertility and husband confirmed this.  So this is not an issue.  She has significant anxiety/depression that is managed pharmacologically.  She also uses CBD, which she thinks helps her anxiety. Will need to assess this on an ongoing basis, as the diagnosis and treatment will likely affect these symptoms.    The patient's diagnosis, an outline of the further diagnostic and laboratory studies which will be required, the recommendation, and alternatives were discussed.  All questions were answered to the patient's satisfaction.  A total of 60 minutes were spent with the patient/family today; 40% was spent in education, counseling and coordination of care for cervical cancer.    Mellody Drown, MD    CC:  [name], MD [address] [place], [place] [address] [phone]

## 2018-03-23 NOTE — Progress Notes (Signed)
Discussed on phone, released to [facility], patient seeing GYN ONC 03/23/18.

## 2018-03-24 LAB — CYTOLOGY - PAP
HPV 16/18/45 GENOTYPING: POSITIVE — AB
HPV: DETECTED — AB

## 2018-03-24 LAB — HIV ANTIBODY (ROUTINE TESTING W REFLEX): HIV Screen 4th Generation wRfx: NONREACTIVE

## 2018-03-25 ENCOUNTER — Inpatient Hospital Stay (HOSPITAL_BASED_OUTPATIENT_CLINIC_OR_DEPARTMENT_OTHER): Payer: Self-pay | Admitting: Oncology

## 2018-03-25 ENCOUNTER — Encounter: Payer: Self-pay | Admitting: Oncology

## 2018-03-25 ENCOUNTER — Other Ambulatory Visit: Payer: Self-pay

## 2018-03-25 ENCOUNTER — Telehealth: Payer: Self-pay

## 2018-03-25 VITALS — BP 150/97 | HR 109 | Temp 97.4°F | Ht 67.0 in | Wt 240.0 lb

## 2018-03-25 DIAGNOSIS — Z720 Tobacco use: Secondary | ICD-10-CM

## 2018-03-25 DIAGNOSIS — C539 Malignant neoplasm of cervix uteri, unspecified: Secondary | ICD-10-CM

## 2018-03-25 DIAGNOSIS — Z7189 Other specified counseling: Secondary | ICD-10-CM

## 2018-03-25 NOTE — Telephone Encounter (Signed)
Faxed port placement orders to Dr Leotis Pain office/ fax comformation confirmed.

## 2018-03-25 NOTE — Progress Notes (Signed)
Notified of non-reactive HIV result. I will call her as planned with PET results. Dr. Janese Banks has answered all of her questions. She has no barriers or needs at this time.

## 2018-03-25 NOTE — Progress Notes (Signed)
Hematology/Oncology Consult note [facility] [phone] [phone]  Patient Care Team: [name], MD as PCP - General (Family Medicine) Clent Jacks, RN as Registered Nurse   Name of the patient: Kristy Gordon  [id number]  08-30-1985    Reason for referral- new diagnosis of cervical cancer   Referring physician- Dr. Purvis Kilts  Date of visit: 03/25/18   History of presenting illness-patient is a 32 year old female G31 who initially presented to the ER on 03/15/2018 with symptoms of abdominal pain and cramping.  She has also been having irregular menstrual bleeding and spotting on and off for the last few months.She had an ultrasound pelvis done in the ER which showed a large 5.8 cm hypoechoic vascular mass within the cervix and the lower uterine segment.  Patient had last seen GYN in 2013 and had not had a Pap smear done since then.  Patient was seen by GYN Dr. Nechama Guard on 1213 and was found to have firm friable cervix and multiple biopsies were taken which showed adenocarcinoma.  She was then seen by Dr. Fransisca Connors from GYN oncology.  Pelvic exam showed anteverted cervix that was replaced by tumor and extension of cancer into the left parametrium and possibly to the pelvic sidewall.  Based on this she was not considered to be a surgical candidate.  PET CT scan and possible chemoradiation was recommended.  She is here for further treatment recommendations.   Overall patient is doing well and other than occasional spotting she denies other complaints  ECOG PS- 0  Pain scale- 0   Review of systems- Review of Systems  Constitutional: Negative for chills, fever, malaise/fatigue and weight loss.  HENT: Negative for congestion, ear discharge and nosebleeds.   Eyes: Negative for blurred vision.  Respiratory: Negative for cough, hemoptysis, sputum production, shortness of breath and wheezing.   Cardiovascular: Negative for chest pain,  palpitations, orthopnea and claudication.  Gastrointestinal: Negative for abdominal pain, blood in stool, constipation, diarrhea, heartburn, melena, nausea and vomiting.  Genitourinary: Negative for dysuria, flank pain, frequency, hematuria and urgency.       Vaginal bleeding  Musculoskeletal: Negative for back pain, joint pain and myalgias.  Skin: Negative for rash.  Neurological: Negative for dizziness, tingling, focal weakness, seizures, weakness and headaches.  Endo/Heme/Allergies: Does not bruise/bleed easily.  Psychiatric/Behavioral: Negative for depression and suicidal ideas. The patient does not have insomnia.     Allergies  Allergen Reactions  . Amoxicillin Rash  . Penicillins Hives    Patient Active Problem List   Diagnosis Date Noted  . 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 Hypothyroidism due to Hashimoto's thyroiditis 03/18/2018  . H/O fetal demise, not currently pregnant 03/18/2018  . Anxiety 09/05/2015  . Clinical depression 09/05/2015  . Big thyroid 09/05/2015  . Cannot sleep 09/05/2015  . Adiposity 09/05/2015  . 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 Current tobacco use 09/05/2015     Past Medical History:  Diagnosis Date  . Anxiety   . Hashimoto's thyroiditis   . Hypothyroid      Past Surgical History:  Procedure Laterality Date  . BUNIONECTOMY Right   . TONSILLECTOMY      Social History   Socioeconomic History  . Marital status: Married    Spouse name: Not on file  . Number of children: Not on file  . Years of education: Not on file  . Highest education level: Not on file  Occupational History  . Not on file  Social Needs  . Financial resource strain: Not on file  . Food insecurity:    Worry: Not on file    Inability: Not on file  . Transportation needs:    Medical: Not on file    Non-medical: Not on file  Tobacco Use  . Smoking status: Current Every Day Smoker     Packs/day: 1.00    Years: 5.00    Pack years: 5.00    Types: Cigarettes  . Smokeless tobacco: Never Used  Substance and Sexual Activity  . Alcohol use: Yes    Comment: rare  . Drug use: No  . Sexual activity: Yes    Birth control/protection: None  Lifestyle  . Physical activity:    Days per week: Not on file    Minutes per session: Not on file  . Stress: Not on file  Relationships  . Social connections:    Talks on phone: Not on file    Gets together: Not on file    Attends religious service: Not on file    Active member of club or organization: Not on file    Attends meetings of clubs or organizations: Not on file    Relationship status: Not on file  . Intimate partner violence:    Fear of current or ex partner: Not on file    Emotionally abused: Not on file    Physically abused: Not on file    Forced sexual activity: Not on file  Other Topics Concern  . Not on file  Social History Narrative  . Not on file     Family History  Adopted: Yes  Family history unknown: Yes     Current Outpatient Medications:  .  ALPRAZolam (XANAX) 0.5 MG tablet, Take 1 tablet (0.5 mg total) by mouth 3 (three) times daily as needed for anxiety., Disp: 60 tablet, Rfl: 1 .  citalopram (CELEXA) 40 MG tablet, Take 1 tablet (40 mg total) by mouth daily., Disp: , Rfl:  .  levothyroxine (SYNTHROID, LEVOTHROID) 112 MCG tablet, Take 2 tablets (224 mcg total) by mouth daily., Disp: 60 tablet, Rfl: 5 .  LORazepam (ATIVAN) 0.5 MG tablet, Take 1 tablet (0.5 mg total) by mouth every 8 (eight) hours as needed for anxiety., Disp: 30 tablet, Rfl: 0 .  HYDROcodone-acetaminophen (NORCO/VICODIN) 5-325 MG tablet, Take 1 tablet by mouth every 4 (four) hours as needed. (Patient not taking: Reported on 03/17/2018), Disp: 15 tablet, Rfl: 0   Physical exam:  Vitals:   03/25/18 0826  BP: (!) 150/97  Pulse: (!) 109  Temp: (!) 97.4 F (36.3 C)  TempSrc: Tympanic  Weight: 240 lb (108.9 kg)  Height: 5\' 7"  (1.702  m)   Physical Exam HENT:     Head: Normocephalic and atraumatic.  Eyes:     Pupils: Pupils are equal, round, and reactive to light.  Neck:     Musculoskeletal: Normal range of motion.  Cardiovascular:     Rate and Rhythm: Normal rate and regular rhythm.     Heart sounds: Normal heart sounds.  Pulmonary:     Effort: Pulmonary effort is normal.     Breath sounds: Normal breath sounds.  Abdominal:     General: Bowel sounds are normal.     Palpations: Abdomen is soft.  Skin:    General: Skin is warm and dry.  Neurological:     Mental Status: She is alert and oriented to person, place, and time.  CMP Latest Ref Rng & Units 03/15/2018  Glucose 70 - 99 mg/dL 98  BUN 6 - 20 mg/dL 10  Creatinine 0.44 - 1.00 mg/dL 0.54  Sodium 135 - 145 mmol/L 139  Potassium 3.5 - 5.1 mmol/L 3.3(L)  Chloride 98 - 111 mmol/L 106  CO2 22 - 32 mmol/L 21(L)  Calcium 8.9 - 10.3 mg/dL 9.0  Total Protein 6.5 - 8.1 g/dL 9.5(H)  Total Bilirubin 0.3 - 1.2 mg/dL 0.6  Alkaline Phos 38 - 126 U/L 92  AST 15 - 41 U/L 12(L)  ALT 0 - 44 U/L 10   CBC Latest Ref Rng & Units 03/15/2018  WBC 4.0 - 10.5 K/uL 13.8(H)  Hemoglobin 12.0 - 15.0 g/dL 10.8(L)  Hematocrit 36.0 - 46.0 % 34.9(L)  Platelets 150 - 400 K/uL 742(H)    No images are attached to the encounter.  US Pelvis Transvanginal Non-ob (tv Only)  Result Date: 03/15/2018 CLINICAL DATA:  Vaginal bleeding and pain EXAM: TRANSABDOMINAL AND TRANSVAGINAL ULTRASOUND OF PELVIS TECHNIQUE: Both transabdominal and transvaginal ultrasound examinations of the pelvis were performed. Transabdominal technique was performed for global imaging of the pelvis including uterus, ovaries, adnexal regions, and pelvic cul-de-sac. It was necessary to proceed with endovaginal exam following the transabdominal exam to visualize the uterus endometrium adnexa. COMPARISON:  None FINDINGS: Uterus Measurements: 9.6 cm length by 4.4 cm height by 4.6 cm wide = volume: 100.96 mL.  Large hypoechoic mass within the cervix/lower uterine segment, this measures 5.8 x 4.6 x 5 cm and is vascular. Endometrium Thickness: 8.9 mm.  No focal abnormality visualized. Right ovary Measurements: 2.7 x 1.5 x 1.9 cm = volume: 4.1 mL. Normal appearance/no adnexal mass. Left ovary Measurements: 2.3 x 1.2 x 1.4 cm = volume: 2 mL. Normal appearance/no adnexal mass. Other findings Trace free fluid. IMPRESSION: 1. Large 5.8 cm hypoechoic vascular mass within the cervix/lower uterine segment. Gynecologic consultation is recommended. 2. Trace free fluid in the pelvis. Otherwise negative pelvic ultrasound. Electronically Signed   By: Donavan Foil M.D.   On: 03/15/2018 18:06   US Pelvis (transabdominal Only)  Result Date: 03/15/2018 CLINICAL DATA:  Vaginal bleeding and pain EXAM: TRANSABDOMINAL AND TRANSVAGINAL ULTRASOUND OF PELVIS TECHNIQUE: Both transabdominal and transvaginal ultrasound examinations of the pelvis were performed. Transabdominal technique was performed for global imaging of the pelvis including uterus, ovaries, adnexal regions, and pelvic cul-de-sac. It was necessary to proceed with endovaginal exam following the transabdominal exam to visualize the uterus endometrium adnexa. COMPARISON:  None FINDINGS: Uterus Measurements: 9.6 cm length by 4.4 cm height by 4.6 cm wide = volume: 100.96 mL. Large hypoechoic mass within the cervix/lower uterine segment, this measures 5.8 x 4.6 x 5 cm and is vascular. Endometrium Thickness: 8.9 mm.  No focal abnormality visualized. Right ovary Measurements: 2.7 x 1.5 x 1.9 cm = volume: 4.1 mL. Normal appearance/no adnexal mass. Left ovary Measurements: 2.3 x 1.2 x 1.4 cm = volume: 2 mL. Normal appearance/no adnexal mass. Other findings Trace free fluid. IMPRESSION: 1. Large 5.8 cm hypoechoic vascular mass within the cervix/lower uterine segment. Gynecologic consultation is recommended. 2. Trace free fluid in the pelvis. Otherwise negative pelvic ultrasound.  Electronically Signed   By: Donavan Foil M.D.   On: 03/15/2018 18:06    Assessment and plan- Patient is a 32 y.o. female with newly diagnosed cervical adenocarcinoma involving the parametrium   I have personally reviewed CT abdomen pelvis images independently and discussed findings with the patient.  I have also noted the findings of  pelvic exam by Dr. Fransisca Connors and the results of the pathology.  Given that the tumor was involving the parametrium patient at least has stage IIb disease.  It is uncertain at this time if the pelvic sidewall is involved or if there is any evidence of distant metastatic disease.  Patient is scheduled to undergo a PET CT scan on 03/28/2018.  If there is no evidence of distant metastatic disease for stage IIb/IIIa disease patient would need concurrent chemoradiation and I would recommend weekly cisplatin at 40 mg/m along with EBRT.  Patient will also likely need vaginal brachii therapy down the line.  Patient will be seeing Dr. Donella Stade from radiation oncology on 04/01/2018 to discuss further.  Regardless of the stage of disease patient will need some form of systemic chemo likely weekly cisplatin at this time.  Discussed risks and benefits of cisplatin including all but not limited to nausea, vomiting, fatigue, risk of low blood counts, infections and hospitalization.  Risk of peripheral neuropathy hearing loss as well as kidney dysfunction associated with cisplatin.  Importance of maintaining adequate hydration during chemotherapy.  Patient is likely to receive 5 weeks of radiation treatment which means she would need 5 weekly cycles of cisplatin.  Discussed that we could do this without the port or with a port.  Patient states that she in general has a poor IV access and is agreeable to getting port placement  We will refer patient to vascular surgery for port placement and she will be attending chemotherapy class next week after her PET CT scan.  Treatment will be  potentially given with a curative intent as long as there is no evidence of distant metastatic disease on PET CT scan.  I will see the patient back on 04/01/2018 to discuss the results of PET CT scan and further management.  Staging to be determined after PET CT scan.  Thank you for this kind referral and the opportunity to participate in the care of this patient   Visit Diagnosis 1. 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 Goals of care, counseling/discussion     Dr. Randa Evens, MD, MPH [facility] [phone] 03/28/2018 8:09 AM

## 2018-03-25 NOTE — Progress Notes (Addendum)
Order placed for urine pregnancy test to be performed prior to PET. Order also faxed to radiology at [phone].

## 2018-03-28 ENCOUNTER — Encounter: Payer: Self-pay | Admitting: Oncology

## 2018-03-28 ENCOUNTER — Encounter
Admission: RE | Admit: 2018-03-28 | Discharge: 2018-03-28 | Disposition: A | Discharge status: Home / Self Care | Payer: Self-pay | Source: Ambulatory Visit | Attending: Obstetrics and Gynecology | Admitting: Obstetrics and Gynecology

## 2018-03-28 ENCOUNTER — Telehealth: Payer: Self-pay

## 2018-03-28 ENCOUNTER — Telehealth (INDEPENDENT_AMBULATORY_CARE_PROVIDER_SITE_OTHER): Payer: Self-pay

## 2018-03-28 DIAGNOSIS — Z7189 Other specified counseling: Secondary | ICD-10-CM | POA: Insufficient documentation

## 2018-03-28 DIAGNOSIS — D4959 Neoplasm of unspecified behavior of other genitourinary organ: Secondary | ICD-10-CM | POA: Insufficient documentation

## 2018-03-28 LAB — GLUCOSE, CAPILLARY: Glucose-Capillary: 95 mg/dL (ref 70–99)

## 2018-03-28 MED ORDER — FLUDEOXYGLUCOSE F - 18 (FDG) INJECTION
12.4000 | Freq: Once | INTRAVENOUS | Status: AC | PRN
  Administered 2018-03-28: 12.63 via INTRAVENOUS

## 2018-03-28 NOTE — Telephone Encounter (Signed)
Called and notified Mrs. Paget with the results of her PET scan, after they were reviewed by Dr. Fransisca Connors. Dr. Fransisca Connors agrees with arranged plan for external beam radiation with weekly Cisplatin followed by brachytherapy. She decided to have port placed and is having this procedure 12/26.   IMPRESSION: 6 cm hypermetabolic cervical mass, consistent with primary cervical carcinoma.  Mild hypermetabolic bilateral parametrial, right perirectal, and bilateral iliac lymph nodes, consistent with metastatic disease.  No evidence of metastatic disease within the abdomen, chest, or neck.     Oncology Nurse Navigator Documentation  Navigator Location: [facility] (03/28/18 1600)   )Navigator Encounter Type: Telephone;Diagnostic Results (03/28/18 1600) Telephone: Outgoing Call;Diagnostic Results (03/28/18 1600)                                                  Time Spent with Patient: 15 (03/28/18 1600)

## 2018-03-28 NOTE — Patient Instructions (Signed)
Cisplatin injection  What is this medicine?  CISPLATIN (SIS pla tin) is a chemotherapy drug. It targets fast dividing cells, like cancer cells, and causes these cells to die. This medicine is used to treat many types of cancer like bladder, ovarian, and testicular cancers.  This medicine may be used for other purposes; ask your health care provider or pharmacist if you have questions.  COMMON BRAND NAME(S): Platinol, Platinol -AQ  What should I tell my health care provider before I take this medicine?  They need to know if you have any of these conditions:  -blood disorders  -hearing problems  -kidney disease  -recent or ongoing radiation therapy  -an unusual or allergic reaction to cisplatin, carboplatin, other chemotherapy, other medicines, foods, dyes, or preservatives  -pregnant or trying to get pregnant  -breast-feeding  How should I use this medicine?  This drug is given as an infusion into a vein. It is administered in a hospital or clinic by a specially trained health care professional.  Talk to your pediatrician regarding the use of this medicine in children. Special care may be needed.  Overdosage: If you think you have taken too much of this medicine contact a poison control center or emergency room at once.  NOTE: This medicine is only for you. Do not share this medicine with others.  What if I miss a dose?  It is important not to miss a dose. Call your doctor or health care professional if you are unable to keep an appointment.  What may interact with this medicine?  -dofetilide  -foscarnet  -medicines for seizures  -medicines to increase blood counts like filgrastim, pegfilgrastim, sargramostim  -probenecid  -pyridoxine used with altretamine  -rituximab  -some antibiotics like amikacin, gentamicin, neomycin, polymyxin B, streptomycin, tobramycin  -sulfinpyrazone  -vaccines  -zalcitabine  Talk to your doctor or health care professional before taking any of these  medicines:  -acetaminophen  -aspirin  -ibuprofen  -ketoprofen  -naproxen  This list may not describe all possible interactions. Give your health care provider a list of all the medicines, herbs, non-prescription drugs, or dietary supplements you use. Also tell them if you smoke, drink alcohol, or use illegal drugs. Some items may interact with your medicine.  What should I watch for while using this medicine?  Your condition will be monitored carefully while you are receiving this medicine. You will need important blood work done while you are taking this medicine.  This drug may make you feel generally unwell. This is not uncommon, as chemotherapy can affect healthy cells as well as cancer cells. Report any side effects. Continue your course of treatment even though you feel ill unless your doctor tells you to stop.  In some cases, you may be given additional medicines to help with side effects. Follow all directions for their use.  Call your doctor or health care professional for advice if you get a fever, chills or sore throat, or other symptoms of a cold or flu. Do not treat yourself. This drug decreases your body's ability to fight infections. Try to avoid being around people who are sick.  This medicine may increase your risk to bruise or bleed. Call your doctor or health care professional if you notice any unusual bleeding.  Be careful brushing and flossing your teeth or using a toothpick because you may get an infection or bleed more easily. If you have any dental work done, tell your dentist you are receiving this medicine.  Avoid taking products   that contain aspirin, acetaminophen, ibuprofen, naproxen, or ketoprofen unless instructed by your doctor. These medicines may hide a fever.  Do not become pregnant while taking this medicine. Women should inform their doctor if they wish to become pregnant or think they might be pregnant. There is a potential for serious side effects to an unborn child. Talk to  your health care professional or pharmacist for more information. Do not breast-feed an infant while taking this medicine.  Drink fluids as directed while you are taking this medicine. This will help protect your kidneys.  Call your doctor or health care professional if you get diarrhea. Do not treat yourself.  What side effects may I notice from receiving this medicine?  Side effects that you should report to your doctor or health care professional as soon as possible:  -allergic reactions like skin rash, itching or hives, swelling of the face, lips, or tongue  -signs of infection - fever or chills, cough, sore throat, pain or difficulty passing urine  -signs of decreased platelets or bleeding - bruising, pinpoint red spots on the skin, black, tarry stools, nosebleeds  -signs of decreased red blood cells - unusually weak or tired, fainting spells, lightheadedness  -breathing problems  -changes in hearing  -gout pain  -low blood counts - This drug may decrease the number of white blood cells, red blood cells and platelets. You may be at increased risk for infections and bleeding.  -nausea and vomiting  -pain, swelling, redness or irritation at the injection site  -pain, tingling, numbness in the hands or feet  -problems with balance, movement  -trouble passing urine or change in the amount of urine  Side effects that usually do not require medical attention (report to your doctor or health care professional if they continue or are bothersome):  -changes in vision  -loss of appetite  -metallic taste in the mouth or changes in taste  This list may not describe all possible side effects. Call your doctor for medical advice about side effects. You may report side effects to FDA at 1-800-FDA-1088.  Where should I keep my medicine?  This drug is given in a hospital or clinic and will not be stored at home.  NOTE: This sheet is a summary. It may not cover all possible information. If you have questions about this medicine,  talk to your doctor, pharmacist, or health care provider.   2019 Elsevier/Gold Standard (2007-06-28 14:40:54)

## 2018-03-28 NOTE — Telephone Encounter (Signed)
I spoke with the patient and gave her the arrival time and instructions for her port placement on 03/31/18. Arrival time of 8:15 am.

## 2018-03-29 ENCOUNTER — Inpatient Hospital Stay: Payer: Self-pay

## 2018-03-31 ENCOUNTER — Other Ambulatory Visit (INDEPENDENT_AMBULATORY_CARE_PROVIDER_SITE_OTHER): Payer: Self-pay | Admitting: Nurse Practitioner

## 2018-03-31 ENCOUNTER — Other Ambulatory Visit: Payer: Self-pay

## 2018-03-31 ENCOUNTER — Encounter
Admission: RE | Disposition: A | Discharge status: Home / Self Care | Payer: Self-pay | Source: Home / Self Care | Attending: Vascular Surgery

## 2018-03-31 ENCOUNTER — Encounter: Payer: Self-pay | Admitting: *Deleted

## 2018-03-31 ENCOUNTER — Ambulatory Visit
Admission: RE | Admit: 2018-03-31 | Discharge: 2018-03-31 | Disposition: A | Discharge status: Home / Self Care | Payer: Self-pay | Attending: Vascular Surgery | Admitting: Vascular Surgery

## 2018-03-31 DIAGNOSIS — C539 Malignant neoplasm of cervix uteri, unspecified: Secondary | ICD-10-CM | POA: Insufficient documentation

## 2018-03-31 DIAGNOSIS — Z79899 Other long term (current) drug therapy: Secondary | ICD-10-CM | POA: Insufficient documentation

## 2018-03-31 DIAGNOSIS — E063 Autoimmune thyroiditis: Secondary | ICD-10-CM | POA: Insufficient documentation

## 2018-03-31 DIAGNOSIS — F17210 Nicotine dependence, cigarettes, uncomplicated: Secondary | ICD-10-CM | POA: Insufficient documentation

## 2018-03-31 DIAGNOSIS — Z79890 Hormone replacement therapy (postmenopausal): Secondary | ICD-10-CM | POA: Insufficient documentation

## 2018-03-31 DIAGNOSIS — Z880 Allergy status to penicillin: Secondary | ICD-10-CM | POA: Insufficient documentation

## 2018-03-31 HISTORY — DX: Malignant (primary) neoplasm, unspecified: C80.1

## 2018-03-31 HISTORY — PX: PORTA CATH INSERTION: CATH118285

## 2018-03-31 SURGERY — PORTA CATH INSERTION
Anesthesia: Moderate Sedation

## 2018-03-31 MED ORDER — HEPARIN (PORCINE) IN NACL 1000-0.9 UT/500ML-% IV SOLN
INTRAVENOUS | Status: AC
  Filled 2018-03-31: qty 500

## 2018-03-31 MED ORDER — FENTANYL CITRATE (PF) 100 MCG/2ML IJ SOLN
INTRAMUSCULAR | Status: AC
  Filled 2018-03-31: qty 2

## 2018-03-31 MED ORDER — CLINDAMYCIN PHOSPHATE 300 MG/50ML IV SOLN
INTRAVENOUS | Status: AC
  Administered 2018-03-31: 300 mg via INTRAVENOUS
  Filled 2018-03-31: qty 50

## 2018-03-31 MED ORDER — MIDAZOLAM HCL 2 MG/2ML IJ SOLN
INTRAMUSCULAR | Status: DC | PRN
  Administered 2018-03-31: 2 mg via INTRAVENOUS
  Administered 2018-03-31 (×3): 1 mg via INTRAVENOUS

## 2018-03-31 MED ORDER — DIPHENHYDRAMINE HCL 50 MG/ML IJ SOLN
INTRAMUSCULAR | Status: DC | PRN
  Administered 2018-03-31: 50 mg via INTRAVENOUS

## 2018-03-31 MED ORDER — FENTANYL CITRATE (PF) 100 MCG/2ML IJ SOLN
INTRAMUSCULAR | Status: DC | PRN
  Administered 2018-03-31 (×4): 50 ug via INTRAVENOUS

## 2018-03-31 MED ORDER — DIPHENHYDRAMINE HCL 50 MG/ML IJ SOLN
INTRAMUSCULAR | Status: AC
  Filled 2018-03-31: qty 1

## 2018-03-31 MED ORDER — SODIUM CHLORIDE 0.9 % IV SOLN
INTRAVENOUS | Status: DC
  Administered 2018-03-31: 09:00:00 via INTRAVENOUS

## 2018-03-31 MED ORDER — HYDROMORPHONE HCL 1 MG/ML IJ SOLN: 1.0000 mg | Freq: Once | INTRAMUSCULAR | Status: DC | PRN

## 2018-03-31 MED ORDER — CLINDAMYCIN PHOSPHATE 300 MG/50ML IV SOLN
300.0000 mg | Freq: Once | INTRAVENOUS | Status: AC
  Administered 2018-03-31: 300 mg via INTRAVENOUS

## 2018-03-31 MED ORDER — MIDAZOLAM HCL 5 MG/5ML IJ SOLN
INTRAMUSCULAR | Status: AC
  Filled 2018-03-31: qty 5

## 2018-03-31 MED ORDER — LIDOCAINE-EPINEPHRINE (PF) 1 %-1:200000 IJ SOLN
INTRAMUSCULAR | Status: AC
  Filled 2018-03-31: qty 10

## 2018-03-31 MED ORDER — ONDANSETRON HCL 4 MG/2ML IJ SOLN: 4.0000 mg | Freq: Four times a day (QID) | INTRAMUSCULAR | Status: DC | PRN

## 2018-03-31 MED ORDER — LIDOCAINE-EPINEPHRINE (PF) 1 %-1:200000 IJ SOLN
INTRAMUSCULAR | Status: AC
  Filled 2018-03-31: qty 20

## 2018-03-31 MED ORDER — SODIUM CHLORIDE 0.9 % IV SOLN
Freq: Once | INTRAVENOUS | Status: AC
  Administered 2018-03-31: 10:00:00
  Filled 2018-03-31: qty 2

## 2018-03-31 SURGICAL SUPPLY — 11 items
COVER PROBE U/S 5X48 (MISCELLANEOUS) ×3 IMPLANT
GAUZE SPONGE 4X4 12PLY STRL (GAUZE/BANDAGES/DRESSINGS) ×3 IMPLANT
KIT PORT POWER 8FR ISP CVUE (Port) ×3 IMPLANT
PACK ANGIOGRAPHY (CUSTOM PROCEDURE TRAY) ×3 IMPLANT
PAD GROUND ADULT SPLIT (MISCELLANEOUS) ×3 IMPLANT
PENCIL ELECTRO HAND CTR (MISCELLANEOUS) ×3 IMPLANT
PREP CHG 10.5 TEAL (MISCELLANEOUS) ×3 IMPLANT
SUT MNCRL AB 4-0 PS2 18 (SUTURE) ×3 IMPLANT
SUT PROLENE 0 CT 1 30 (SUTURE) ×3 IMPLANT
SUT VIC AB 3-0 SH 27 (SUTURE) ×2
SUT VIC AB 3-0 SH 27X BRD (SUTURE) ×1 IMPLANT

## 2018-03-31 NOTE — Op Note (Signed)
      [facility]       Operative Note  Date: 03/31/2018  Preoperative diagnosis:  1. Cervical cancer  Postoperative diagnosis:  Same as above  Procedures: #1. Ultrasound guidance for vascular access to the right internal jugular vein. #2. Fluoroscopic guidance for placement of catheter. #3. Placement of CT compatible Port-A-Cath, right internal jugular vein.  Surgeon: Leotis Pain, MD.   Anesthesia: Local with moderate conscious sedation for approximately 35  minutes using 5 mg of Versed and 200 mcg of Fentanyl  Fluoroscopy time: less than 1 minute  Contrast used: 0  Estimated blood loss: 3 cc  Indication for the procedure:  The patient is a 32 y.o.female with cervical cancer.  The patient needs a Port-A-Cath for durable venous access, chemotherapy, lab draws, and CT scans. We are asked to place this. Risks and benefits were discussed and informed consent was obtained.  Description of procedure: The patient was brought to the vascular and interventional radiology suite.  Moderate conscious sedation was administered throughout the procedure during a face to face encounter with the patient with my supervision of the RN administering medicines and monitoring the patient's vital signs, pulse oximetry, telemetry and mental status throughout from the start of the procedure until the patient was taken to the recovery room. The right neck chest and shoulder were sterilely prepped and draped, and a sterile surgical field was created. Ultrasound was used to help visualize a patent right internal jugular vein. This was then accessed under direct ultrasound guidance without difficulty with the Seldinger needle and a permanent image was recorded. A J-wire was placed. After skin nick and dilatation, the peel-away sheath was then placed over the wire. I then anesthetized an area under the clavicle approximately 1-2 fingerbreadths. A transverse incision was created and an inferior  pocket was created with electrocautery and blunt dissection. The port was then brought onto the field, placed into the pocket and secured to the chest wall with 2 Prolene sutures. The catheter was connected to the port and tunneled from the subclavicular incision to the access site. Fluoroscopic guidance was then used to cut the catheter to an appropriate length. The catheter was then placed through the peel-away sheath and the peel-away sheath was removed. The catheter tip was parked in excellent location under fluorocoscopic guidance in the cavoatrial junction. The pocket was then irrigated with antibiotic impregnated saline and the wound was closed with a running 3-0 Vicryl and a 4-0 Monocryl. The access incision was closed with a single 4-0 Monocryl. The Huber needle was used to withdraw blood and flush the port with heparinized saline. Dermabond was then placed as a dressing. The patient tolerated the procedure well and was taken to the recovery room in stable condition.   Leotis Pain 03/31/2018 11:14 AM   This note was created with Dragon Medical transcription system. Any errors in dictation are purely unintentional.

## 2018-03-31 NOTE — H&P (Signed)
[facility] History & Physical Update  The patient was interviewed and re-examined.  The patient's previous History and Physical has been reviewed and is unchanged.  There is no change in the plan of care. We plan to proceed with the scheduled procedure.  Leotis Pain, MD  03/31/2018, 8:17 AM

## 2018-03-31 NOTE — OR Nursing (Signed)
Pt declined pain medication, preferred to take tylenol from home.

## 2018-03-31 NOTE — Discharge Instructions (Signed)
Implanted Port Insertion, Care After  This sheet gives you information about how to care for yourself after your procedure. Your health care provider may also give you more specific instructions. If you have problems or questions, contact your health care provider.  What can I expect after the procedure?  After the procedure, it is common to have:  · Discomfort at the port insertion site.  · Bruising on the skin over the port. This should improve over 3-4 days.  Follow these instructions at home:  Port care  · After your port is placed, you will get a manufacturer's information card. The card has information about your port. Keep this card with you at all times.  · Take care of the port as told by your health care provider. Ask your health care provider if you or a family member can get training for taking care of the port at home. A home health care nurse may also take care of the port.  · Make sure to remember what type of port you have.  Incision care         · Follow instructions from your health care provider about how to take care of your port insertion site. Make sure you:  ? Wash your hands with soap and water before and after you change your bandage (dressing). If soap and water are not available, use hand sanitizer.  ? Change your dressing as told by your health care provider.  ? Leave stitches (sutures), skin glue, or adhesive strips in place. These skin closures may need to stay in place for 2 weeks or longer. If adhesive strip edges start to loosen and curl up, you may trim the loose edges. Do not remove adhesive strips completely unless your health care provider tells you to do that.  · Check your port insertion site every day for signs of infection. Check for:  ? Redness, swelling, or pain.  ? Fluid or blood.  ? Warmth.  ? Pus or a bad smell.  Activity  · Return to your normal activities as told by your health care provider. Ask your health care provider what activities are safe for you.  · Do not  lift anything that is heavier than 10 lb (4.5 kg), or the limit that you are told, until your health care provider says that it is safe.  General instructions  · Take over-the-counter and prescription medicines only as told by your health care provider.  · Do not take baths, swim, or use a hot tub until your health care provider approves. Ask your health care provider if you may take showers. You may only be allowed to take sponge baths.  · Do not drive for 24 hours if you were given a sedative during your procedure.  · Wear a medical alert bracelet in case of an emergency. This will tell any health care providers that you have a port.  · Keep all follow-up visits as told by your health care provider. This is important.  Contact a health care provider if:  · You cannot flush your port with saline as directed, or you cannot draw blood from the port.  · You have a fever or chills.  · You have redness, swelling, or pain around your port insertion site.  · You have fluid or blood coming from your port insertion site.  · Your port insertion site feels warm to the touch.  · You have pus or a bad smell coming from the port   insertion site.  Get help right away if:  · You have chest pain or shortness of breath.  · You have bleeding from your port that you cannot control.  Summary  · Take care of the port as told by your health care provider. Keep the manufacturer's information card with you at all times.  · Change your dressing as told by your health care provider.  · Contact a health care provider if you have a fever or chills or if you have redness, swelling, or pain around your port insertion site.  · Keep all follow-up visits as told by your health care provider.  This information is not intended to replace advice given to you by your health care provider. Make sure you discuss any questions you have with your health care provider.  Document Released: 01/11/2013 Document Revised: 10/19/2017 Document Reviewed:  10/19/2017  Elsevier Interactive Patient Education © 2019 [employer]

## 2018-04-01 ENCOUNTER — Other Ambulatory Visit: Payer: Self-pay | Admitting: *Deleted

## 2018-04-01 ENCOUNTER — Encounter: Payer: Self-pay | Admitting: Oncology

## 2018-04-01 ENCOUNTER — Inpatient Hospital Stay (HOSPITAL_BASED_OUTPATIENT_CLINIC_OR_DEPARTMENT_OTHER): Payer: Self-pay | Admitting: Oncology

## 2018-04-01 ENCOUNTER — Ambulatory Visit
Admission: RE | Admit: 2018-04-01 | Discharge: 2018-04-01 | Disposition: A | Discharge status: Home / Self Care | Payer: Self-pay | Source: Ambulatory Visit | Attending: Radiation Oncology | Admitting: Radiation Oncology

## 2018-04-01 ENCOUNTER — Other Ambulatory Visit: Payer: Self-pay

## 2018-04-01 ENCOUNTER — Encounter: Payer: Self-pay | Admitting: Radiation Oncology

## 2018-04-01 VITALS — BP 137/89 | HR 93 | Temp 99.2°F | Resp 18 | Ht 67.0 in | Wt 237.5 lb

## 2018-04-01 VITALS — BP 145/93 | HR 94 | Temp 97.3°F | Wt 236.3 lb

## 2018-04-01 DIAGNOSIS — E063 Autoimmune thyroiditis: Secondary | ICD-10-CM | POA: Insufficient documentation

## 2018-04-01 DIAGNOSIS — Z7189 Other specified counseling: Secondary | ICD-10-CM

## 2018-04-01 DIAGNOSIS — C539 Malignant neoplasm of cervix uteri, unspecified: Secondary | ICD-10-CM | POA: Insufficient documentation

## 2018-04-01 DIAGNOSIS — R634 Abnormal weight loss: Secondary | ICD-10-CM

## 2018-04-01 DIAGNOSIS — Z79899 Other long term (current) drug therapy: Secondary | ICD-10-CM | POA: Insufficient documentation

## 2018-04-01 DIAGNOSIS — Z720 Tobacco use: Secondary | ICD-10-CM

## 2018-04-01 DIAGNOSIS — K5900 Constipation, unspecified: Secondary | ICD-10-CM | POA: Insufficient documentation

## 2018-04-01 DIAGNOSIS — F419 Anxiety disorder, unspecified: Secondary | ICD-10-CM | POA: Insufficient documentation

## 2018-04-01 DIAGNOSIS — C778 Secondary and unspecified malignant neoplasm of lymph nodes of multiple regions: Secondary | ICD-10-CM

## 2018-04-01 DIAGNOSIS — E039 Hypothyroidism, unspecified: Secondary | ICD-10-CM | POA: Insufficient documentation

## 2018-04-01 DIAGNOSIS — F17210 Nicotine dependence, cigarettes, uncomplicated: Secondary | ICD-10-CM | POA: Insufficient documentation

## 2018-04-01 MED ORDER — ONDANSETRON HCL 8 MG PO TABS: 8.0000 mg | ORAL_TABLET | Freq: Two times a day (BID) | ORAL | 1 refills | Status: DC | PRN

## 2018-04-01 MED ORDER — DEXAMETHASONE 4 MG PO TABS: ORAL_TABLET | ORAL | 1 refills | Status: DC

## 2018-04-01 MED ORDER — ALPRAZOLAM 1 MG PO TABS: 1.0000 mg | ORAL_TABLET | Freq: Three times a day (TID) | ORAL | 0 refills | Status: DC | PRN

## 2018-04-01 MED ORDER — PROCHLORPERAZINE MALEATE 10 MG PO TABS: 10.0000 mg | ORAL_TABLET | Freq: Four times a day (QID) | ORAL | 1 refills | Status: DC | PRN

## 2018-04-01 MED ORDER — LIDOCAINE-PRILOCAINE 2.5-2.5 % EX CREA: TOPICAL_CREAM | CUTANEOUS | 3 refills | Status: DC

## 2018-04-01 NOTE — Progress Notes (Signed)
The patient has been c/o low grade fever at night with increase pain

## 2018-04-01 NOTE — Consult Note (Signed)
NEW PATIENT EVALUATION  Name: Kristy Gordon  MRN: [id number]  Date:   04/01/2018     DOB: 1985/09/15   This 32 y.o. 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  REFERRING PHYSICIAN: Mellody Drown, MD  CHIEF COMPLAINT:  Chief Complaint  Patient presents with  . Cervical Cancer    Initial Eval    DIAGNOSIS: The encounter diagnosis was 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[place]).   PREVIOUS INVESTIGATIONS:  PET CT scan reviewed Pathology reports reviewed Clinical notes reviewed  HPI: patient is a 32 year old female presented to the emergency room with sharp abdominal pain vaginal bleeding over 3 week period. Pelvic ultrasound demonstrated a cervical mass. Examination by GYN showed a firm and friable cervical mass with biopsies positive for adenocarcinoma.tumor was HPV positive with HPV high risk detected.on pelvic examination by GYN oncologycervix was anteverted replaced by tumor with extension of cancer into the left parametrium and pop possibly pelvic sidewall.PET CT scan demonstrated a 6 on a hypermetabolic cervical mass consistent with known carcinoma. There was also mild hypermetabolic bilateral parametrial right perirectal and bilateral iliac lymph nodes consistent with metastatic disease. No other evidence of distant disease was noted. She went oncology do not think is a surgical case and she is being referred to medical oncology as well as radiation oncology for opinion.She's been seen by medical oncology and a port has been placed for consideration of cisplatin-based concurrent chemotherapy. She seen today for radiation oncology opinion she is doing fairly well quite nervous and upset. She continues to have some mild vaginal bleeding. She tends towards constipation is currently on methadone.  PLANNED TREATMENT REGIMEN: concurrent chemoradiation therapy with both external beam treatment as well as brachytherapy  PAST  MEDICAL HISTORY:  has a past medical history of Anxiety, Cancer ([place]), Hashimoto's thyroiditis, and Hypothyroid.    PAST SURGICAL HISTORY:  Past Surgical History:  Procedure Laterality Date  . BUNIONECTOMY Right   . TONSILLECTOMY      FAMILY HISTORY: She was adopted. Family history is unknown by patient.  SOCIAL HISTORY:  reports that she has been smoking cigarettes. She has a 10.00 pack-year smoking history. She has never used smokeless tobacco. She reports current alcohol use. She reports that she does not use drugs.  ALLERGIES: Amoxicillin and Penicillins  MEDICATIONS:  Current Outpatient Medications  Medication Sig Dispense Refill  . ALPRAZolam (XANAX) 0.5 MG tablet Take 1 tablet (0.5 mg total) by mouth 3 (three) times daily as needed for anxiety. 60 tablet 1  . citalopram (CELEXA) 40 MG tablet Take 1 tablet (40 mg total) by mouth daily. (Patient not taking: Reported on 03/31/2018)    . HYDROcodone-acetaminophen (NORCO/VICODIN) 5-325 MG tablet Take 1 tablet by mouth every 4 (four) hours as needed. 15 tablet 0  . levothyroxine (SYNTHROID, LEVOTHROID) 112 MCG tablet Take 2 tablets (224 mcg total) by mouth daily. 60 tablet 5  . LORazepam (ATIVAN) 0.5 MG tablet Take 1 tablet (0.5 mg total) by mouth every 8 (eight) hours as needed for anxiety. (Patient not taking: Reported on 03/31/2018) 30 tablet 0  . methadone (DOLOPHINE) 5 MG tablet Take 5 mg by mouth daily.     No current facility-administered medications for this encounter.     ECOG PERFORMANCE STATUS:  1 - Symptomatic but completely ambulatory  REVIEW OF SYSTEMS: except for the vaginal bleeding substance abuse problems  Patient denies any weight loss, fatigue, weakness, fever, chills or night sweats. Patient denies any  loss of vision, blurred vision. Patient denies any ringing  of the ears or hearing loss. No irregular heartbeat. Patient denies heart murmur or history of fainting. Patient denies any chest pain or pain radiating to  her upper extremities. Patient denies any shortness of breath, difficulty breathing at night, cough or hemoptysis. Patient denies any swelling in the lower legs. Patient denies any nausea vomiting, vomiting of blood, or coffee ground material in the vomitus. Patient denies any stomach pain. Patient states has had normal bowel movements no significant constipation or diarrhea. Patient denies any dysuria, hematuria or significant nocturia. Patient denies any problems walking, swelling in the joints or loss of balance. Patient denies any skin changes, loss of hair or loss of weight. Patient denies any excessive worrying or anxiety or significant depression. Patient denies any problems with insomnia. Patient denies excessive thirst, polyuria, polydipsia. Patient denies any swollen glands, patient denies easy bruising or easy bleeding. Patient denies any recent infections, allergies or URI. Patient "s visual fields have not changed significantly in recent time.    PHYSICAL EXAM: BP (!) 145/93   Pulse 94   Temp (!) 97.3 F (36.3 C)   Wt 236 lb 5.3 oz (107.2 kg)   BMI 37.02 kg/m  On speculum examination cervix has been replaced by tumor. Tumor is fixed and probably invading the pelvic sidewall.Rectal exam is unremarkable.Well-developed well-nourished patient in NAD. HEENT reveals PERLA, EOMI, discs not visualized.  Oral cavity is clear. No oral mucosal lesions are identified. Neck is clear without evidence of cervical or supraclavicular adenopathy. Lungs are clear to A&P. Cardiac examination is essentially unremarkable with regular rate and rhythm without murmur rub or thrill. Abdomen is benign with no organomegaly or masses noted. Motor sensory and DTR levels are equal and symmetric in the upper and lower extremities. Cranial nerves II through XII are grossly intact. Proprioception is intact. No peripheral adenopathy or edema is identified. No motor or sensory levels are noted. Crude visual fields are within  normal range.  LABORATORY DATA: pathology reports are reviewed    RADIOLOGY RESULTS:PET CT scan is reviewed   IMPRESSION: stage IIIB adenocarcinoma the cervix in 32 year old female  PLAN: at this time patient has has locally advanced adenocarcinoma the cervix stage IIIB. I agree with concurrent chemoradiation with curative intent. I would plan on delivering 4500 cGy to her pelvisin increasing the dose to both of cervical mass as well as enlarged pelvic lymph nodes to 5000 cGy all over 5 weeks using I M RT dose painting technique. Using IM RT will decrease side effects including involvement of pelvic bone marrow decreased symptoms of lower urinary tract and possible decrease in GI side effects. Risks and benefits of treatment including diarrhea fatigue alteration of blood counts possible increased lower urinary tract symptoms skin reaction all were reviewed with the patient and her family. Will use PET/CT fusion study for treatment planning purposes.There will be extra effort by both professional staff as well as technical staff to coordinate and manage concurrent chemoradiation and ensuing side effects during her treatments.I've [place] set up and ordered CT simulation for first thing next week. I discussed the case with medical oncology. We'll also set the patient up for evaluation at [facility] with Dr. Christel Mormon for brachytherapy as part of her treatment plan.  I would like to take this opportunity to thank you for allowing me to participate in the care of your patient.Noreene Filbert, MD

## 2018-04-01 NOTE — Progress Notes (Signed)
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 Positive pelvic LN  Chief complaint/ Reason for visit- discuss PET/Ct scan and further management  Heme/Onc history: -patient is a 32 year old female G74 who initially presented to the ER on 03/15/2018 with symptoms of abdominal pain and cramping.  She has also been having irregular menstrual bleeding and spotting on and off for the last few months.She had an ultrasound pelvis done in the ER which showed a large 5.8 cm hypoechoic vascular mass within the cervix and the lower uterine segment.  Patient had last seen GYN in 2013 and had not had a Pap smear done since then.  Patient was seen by GYN Dr. Nechama Guard on 1213 and was found to have firm friable cervix and multiple biopsies were taken which showed adenocarcinoma.  She was then seen by Dr. Fransisca Connors from GYN oncology.  Pelvic exam showed anteverted cervix that was replaced by tumor and extension of cancer into the left parametrium and possibly to the pelvic sidewall.   PET CT scan on 03/28/2018 showed 6 cm hypermetabolic cervical mass consistent with primary cervical carcinoma.  Mild hypermetabolic bilateral parametrial right perirectal bilateral iliac lymph nodes consistent with metastatic disease.  No evidence of metastatic disease within the abdomen chest or neck.  Plan is to proceed with concurrent chemoradiation with weekly cisplatin  Interval history-patient is highly anxious especially after she heard the results of the PET CT scan from radiation oncology.  She reports that the last couple of weeks have been particularly bad and she has a hard time  sleeping because of anxiety.  She was previously prescribed 0.5 mg of Xanax which she ran out of her and did not feel that the 0.5 mg was helping her much.  ECOG PS- 1 Pain scale- 0 Opioid associated constipation- no  Review of systems- Review of Systems  Constitutional: Positive for malaise/fatigue and weight loss. Negative for chills and fever.  HENT: Negative for congestion, ear discharge and nosebleeds.   Eyes: Negative for blurred vision.  Respiratory: Negative for cough, hemoptysis, sputum production, shortness of breath and wheezing.   Cardiovascular: Negative for chest pain, palpitations, orthopnea and claudication.  Gastrointestinal: Negative for abdominal pain, blood in stool, constipation, diarrhea, heartburn, melena, nausea and vomiting.  Genitourinary: Negative for dysuria, flank pain, frequency, hematuria and urgency.  Musculoskeletal: Negative for back pain, joint pain and myalgias.  Skin: Negative for rash.  Neurological: Negative for dizziness, tingling, focal weakness, seizures, weakness and headaches.  Endo/Heme/Allergies: Does not bruise/bleed easily.  Psychiatric/Behavioral: Negative for depression and suicidal ideas. The patient is nervous/anxious. The patient does not have insomnia.       Allergies  Allergen Reactions  . Amoxicillin Rash  . Penicillins Hives     Past Medical History:  Diagnosis Date  . Anxiety   . Cancer (HCC)    cervical cancer  . Hashimoto's thyroiditis   . Hypothyroid      Past Surgical History:  Procedure Laterality Date  . BUNIONECTOMY Right   . TONSILLECTOMY      Social History   Socioeconomic History  . Marital status: Married    Spouse name: Not on file  .  Number of children: Not on file  . Years of education: Not on file  . Highest education level: Not on file  Occupational History  . Not on file  Social Needs  . Financial resource strain: Not on file  . Food insecurity:    Worry: Not on file    Inability: Not  on file  . Transportation needs:    Medical: Not on file    Non-medical: Not on file  Tobacco Use  . Smoking status: Current Every Day Smoker    Packs/day: 1.00    Years: 10.00    Pack years: 10.00    Types: Cigarettes  . Smokeless tobacco: Never Used  Substance and Sexual Activity  . Alcohol use: Yes    Comment: rare  . Drug use: No    Comment: quit opiates 5 years ago  . Sexual activity: Yes    Birth control/protection: None  Lifestyle  . Physical activity:    Days per week: Not on file    Minutes per session: Not on file  . Stress: Not on file  Relationships  . Social connections:    Talks on phone: Not on file    Gets together: Not on file    Attends religious service: Not on file    Active member of club or organization: Not on file    Attends meetings of clubs or organizations: Not on file    Relationship status: Not on file  . Intimate partner violence:    Fear of current or ex partner: Not on file    Emotionally abused: Not on file    Physically abused: Not on file    Forced sexual activity: Not on file  Other Topics Concern  . Not on file  Social History Narrative  . Not on file    Family History  Adopted: Yes  Family history unknown: Yes     Current Outpatient Medications:  .  ALPRAZolam (XANAX) 1 MG tablet, Take 1 tablet (1 mg total) by mouth 3 (three) times daily as needed for anxiety., Disp: 90 tablet, Rfl: 0 .  citalopram (CELEXA) 40 MG tablet, Take 1 tablet (40 mg total) by mouth daily. (Patient not taking: Reported on 03/31/2018), Disp: , Rfl:  .  HYDROcodone-acetaminophen (NORCO/VICODIN) 5-325 MG tablet, Take 1 tablet by mouth every 4 (four) hours as needed., Disp: 15 tablet, Rfl: 0 .  levothyroxine (SYNTHROID, LEVOTHROID) 112 MCG tablet, Take 2 tablets (224 mcg total) by mouth daily., Disp: 60 tablet, Rfl: 5 .  LORazepam (ATIVAN) 0.5 MG tablet, Take 1 tablet (0.5 mg total) by mouth every 8 (eight) hours as needed for anxiety. (Patient not taking:  Reported on 03/31/2018), Disp: 30 tablet, Rfl: 0 .  methadone (DOLOPHINE) 5 MG tablet, Take 5 mg by mouth daily., Disp: , Rfl:   Physical exam:  Vitals:   04/01/18 1002  BP: 137/89  Pulse: 93  Resp: 18  Temp: 99.2 F (37.3 C)  TempSrc: Tympanic  Weight: 237 lb 8 oz (107.7 kg)  Height: 5\' 7"  (1.702 m)   Physical Exam Constitutional:      Comments: Appears anxious. She is in tears  HENT:     Head: Normocephalic and atraumatic.  Eyes:     Pupils: Pupils are equal, round, and reactive to light.  Neck:     Musculoskeletal: Normal range of motion.  Cardiovascular:     Rate and Rhythm: Normal rate and regular rhythm.     Heart sounds: Normal heart sounds.  Pulmonary:     Effort: Pulmonary effort is normal.     Breath sounds: Normal breath sounds.  Abdominal:     General: Bowel sounds are normal.     Palpations: Abdomen is soft.  Skin:    General: Skin is warm and dry.  Neurological:     Mental Status: She is alert and oriented to person, place, and time.      CMP Latest Ref Rng & Units 03/15/2018  Glucose 70 - 99 mg/dL 98  BUN 6 - 20 mg/dL 10  Creatinine 0.44 - 1.00 mg/dL 0.54  Sodium 135 - 145 mmol/L 139  Potassium 3.5 - 5.1 mmol/L 3.3(L)  Chloride 98 - 111 mmol/L 106  CO2 22 - 32 mmol/L 21(L)  Calcium 8.9 - 10.3 mg/dL 9.0  Total Protein 6.5 - 8.1 g/dL 9.5(H)  Total Bilirubin 0.3 - 1.2 mg/dL 0.6  Alkaline Phos 38 - 126 U/L 92  AST 15 - 41 U/L 12(L)  ALT 0 - 44 U/L 10   CBC Latest Ref Rng & Units 03/15/2018  WBC 4.0 - 10.5 K/uL 13.8(H)  Hemoglobin 12.0 - 15.0 g/dL 10.8(L)  Hematocrit 36.0 - 46.0 % 34.9(L)  Platelets 150 - 400 K/uL 742(H)    No images are attached to the encounter.  US Pelvis Transvanginal Non-ob (tv Only)  Result Date: 03/15/2018 CLINICAL DATA:  Vaginal bleeding and pain EXAM: TRANSABDOMINAL AND TRANSVAGINAL ULTRASOUND OF PELVIS TECHNIQUE: Both transabdominal and transvaginal ultrasound examinations of the pelvis were performed.  Transabdominal technique was performed for global imaging of the pelvis including uterus, ovaries, adnexal regions, and pelvic cul-de-sac. It was necessary to proceed with endovaginal exam following the transabdominal exam to visualize the uterus endometrium adnexa. COMPARISON:  None FINDINGS: Uterus Measurements: 9.6 cm length by 4.4 cm height by 4.6 cm wide = volume: 100.96 mL. Large hypoechoic mass within the cervix/lower uterine segment, this measures 5.8 x 4.6 x 5 cm and is vascular. Endometrium Thickness: 8.9 mm.  No focal abnormality visualized. Right ovary Measurements: 2.7 x 1.5 x 1.9 cm = volume: 4.1 mL. Normal appearance/no adnexal mass. Left ovary Measurements: 2.3 x 1.2 x 1.4 cm = volume: 2 mL. Normal appearance/no adnexal mass. Other findings Trace free fluid. IMPRESSION: 1. Large 5.8 cm hypoechoic vascular mass within the cervix/lower uterine segment. Gynecologic consultation is recommended. 2. Trace free fluid in the pelvis. Otherwise negative pelvic ultrasound. Electronically Signed   By: Donavan Foil M.D.   On: 03/15/2018 18:06   US Pelvis (transabdominal Only)  Result Date: 03/15/2018 CLINICAL DATA:  Vaginal bleeding and pain EXAM: TRANSABDOMINAL AND TRANSVAGINAL ULTRASOUND OF PELVIS TECHNIQUE: Both transabdominal and transvaginal ultrasound examinations of the pelvis were performed. Transabdominal technique was performed for global imaging of the pelvis including uterus, ovaries, adnexal regions, and pelvic cul-de-sac. It was necessary to proceed with endovaginal exam following the transabdominal exam to visualize the uterus endometrium adnexa. COMPARISON:  None FINDINGS: Uterus Measurements: 9.6 cm length by 4.4 cm height by 4.6 cm wide = volume: 100.96 mL. Large hypoechoic mass within the cervix/lower uterine segment, this measures 5.8 x 4.6 x 5 cm and is vascular. Endometrium Thickness: 8.9 mm.  No focal abnormality visualized. Right ovary Measurements: 2.7 x 1.5 x 1.9 cm = volume: 4.1  mL. Normal appearance/no adnexal mass. Left ovary Measurements: 2.3 x 1.2 x 1.4 cm = volume: 2 mL. Normal appearance/no adnexal mass. Other findings Trace free fluid. IMPRESSION: 1. Large 5.8 cm hypoechoic vascular mass within the cervix/lower uterine segment. Gynecologic consultation  is recommended. 2. Trace free fluid in the pelvis. Otherwise negative pelvic ultrasound. Electronically Signed   By: Donavan Foil M.D.   On: 03/15/2018 18:06   Nm Pet Image Initial (pi) Skull Base To Thigh  Result Date: 03/28/2018 CLINICAL DATA:  Initial treatment strategy for cervical adenocarcinoma. EXAM: NUCLEAR MEDICINE PET SKULL BASE TO THIGH TECHNIQUE: 12.6 mCi F-18 FDG was injected intravenously. Full-ring PET imaging was performed from the skull base to thigh after the radiotracer. CT data was obtained and used for attenuation correction and anatomic localization. Fasting blood glucose: 95 mg/dl COMPARISON:  None. FINDINGS: (Background mediastinal blood pool activity: SUV max = 3.8) NECK: No hypermetabolic lymph nodes. Mild goiter is seen with diffuse hypermetabolic activity throughout the thyroid gland, consistent with thyroiditis. Incidental CT findings:  None. CHEST: No hypermetabolic masses or lymphadenopathy. No suspicious pulmonary nodules seen on CT images. Incidental CT findings:  None. ABDOMEN/PELVIS: No abnormal hypermetabolic activity within the liver, pancreas, adrenal glands, or spleen. No hypermetabolic lymph nodes in the abdomen. No evidence of hydroureteronephrosis. Hypermetabolic soft tissue mass in the cervix measures 6.3 x 6 2 cm, within SUV of 17.8. Bilateral hypermetabolic parametrial lymph nodes are seen, measuring 1.3 cm on the left with SUV max of 5.4, and measuring 1.9 cm on the right with SUV max of 11.3. An 8 mm right perirectal lymph node and sub-cm left internal iliac lymph node also show mild hypermetabolic activity. Mild hypermetabolic bilateral external iliac lymphadenopathy is seen,  largest on the left measuring 11 mm with SUV max of 3.9, and on the right measuring 1.3 cm with SUV max of 6.6. Incidental CT findings:  None. SKELETON:  No hypermetabolic bone lesions identified. Incidental CT findings:  None. IMPRESSION: 6 cm hypermetabolic cervical mass, consistent with primary cervical carcinoma. Mild hypermetabolic bilateral parametrial, right perirectal, and bilateral iliac lymph nodes, consistent with metastatic disease. No evidence of metastatic disease within the abdomen, chest, or neck. Diffuse thyroiditis incidentally noted. Recommend correlation with thyroid function tests. Electronically Signed   By: Earle Gell M.D.   On: 03/28/2018 14:33     Assessment and plan- Patient is a 32 y.o. female with newly diagnosed adenocarcinoma of the cervix FIGO stage III C1r T2N1M0 with metastases to the external iliac (pelvic lymph nodes)  I have reviewed PET/CT scan images independently and discussed findings with the patient.  Patient has evidence of primary cervical mass along with adenopathy in the parametrial as well as external iliac and inguinal region which make this a stage III C1 disease.  No evidence of para-aortic lymph node involvement or distant metastases.  I recommend definitive chemoradiation with weekly cisplatin 40 mg/m for 5 weeks.  Patient already has a port placed in anticipation of chemotherapy and has attended chemotherapy teach class as well.  Discussed risks and benefits of cisplatin including all but not limited to nausea, vomiting, fatigue, risk of low blood counts, infection and hospitalization.  Risk of hearing loss, kidney dysfunction and peripheral neuropathy associated with cisplatin.  Treatment will be given with a curative intent.  Patient understands and agrees to proceed as planned.  Overall stage III cervical cancer and especially adenocarcinoma histology carries a poor prognosis with roughly a 5-year disease-free survival of about 30%  Anxiety  secondary to recent diagnosis: I will increase her Xanax to 1 mg every 8 as needed and if her anxiety continues to be an issue I will consider adding Zoloft at that time if patient is not taking her Celexa.  I  will see her back on 04/11/2018 with CBC CMP for cycle 1 of weekly cisplatin.  I have emphasized the importance of maintaining adequate hydration during chemotherapy.  Patient reports having significant weight loss partly secondary to her anxiety and I have advised her to try Ensure 1-2 drinks a day to keep up with her nutritional intake  Cancer Staging Adenocarcinoma of cervix [facility]) Staging form: Cervix Uteri, AJCC 8th Edition - Clinical stage from 04/01/2018: FIGO Stage III (cT3, cN1, cM0) - Signed by Sindy Guadeloupe, MD on 04/01/2018   Total face to face encounter time for this patient visit was 40 min. >50% of the time was  spent in counseling and coordination of care.     Visit Diagnosis 1. Goals of care, counseling/discussion   2. Adenocarcinoma of cervix [facility])      Dr. Randa Evens, MD, MPH [facility] at [facility] [phone] 04/01/2018 12:22 PM

## 2018-04-03 ENCOUNTER — Encounter: Payer: Self-pay | Admitting: Vascular Surgery

## 2018-04-04 ENCOUNTER — Ambulatory Visit
Admission: RE | Admit: 2018-04-04 | Discharge: 2018-04-04 | Disposition: A | Discharge status: Home / Self Care | Payer: Self-pay | Source: Ambulatory Visit | Attending: Radiation Oncology | Admitting: Radiation Oncology

## 2018-04-04 ENCOUNTER — Ambulatory Visit: Payer: Self-pay | Admitting: Obstetrics and Gynecology

## 2018-04-04 DIAGNOSIS — C539 Malignant neoplasm of cervix uteri, unspecified: Secondary | ICD-10-CM | POA: Insufficient documentation

## 2018-04-07 ENCOUNTER — Other Ambulatory Visit: Payer: Self-pay | Admitting: Oncology

## 2018-04-07 ENCOUNTER — Other Ambulatory Visit: Payer: Self-pay | Admitting: *Deleted

## 2018-04-07 ENCOUNTER — Telehealth: Payer: Self-pay | Admitting: *Deleted

## 2018-04-07 DIAGNOSIS — C539 Malignant neoplasm of cervix uteri, unspecified: Secondary | ICD-10-CM

## 2018-04-07 MED ORDER — OXYCODONE HCL 5 MG PO TABS: 5.0000 mg | ORAL_TABLET | ORAL | 0 refills | Status: DC | PRN

## 2018-04-07 NOTE — Progress Notes (Signed)
Maximized tylenol for today.   Previously prescribed Norco 5/325 mg.   RX Oxycodone 5 mg q 4 for pain.   Attempted to call patients husband.   Patient will be seen in [facility] on 04/08/2018 for evaluation/management of severe pelvic pain.  Faythe Casa, NP 04/07/2018 3:06 PM

## 2018-04-07 NOTE — Telephone Encounter (Signed)
Per patient's husband, Wyona Almas, patient is writhing in pain in her pelvic region and taking the maximum amount of Tylenol. Offered an appointment with the NP in [facility] today and he said he would have to see if he could get her up and here; he will check with her and call me back.   dhs  Appointment accepted for tomorrow am at 9:30

## 2018-04-08 ENCOUNTER — Encounter: Payer: Self-pay | Admitting: Nurse Practitioner

## 2018-04-08 ENCOUNTER — Inpatient Hospital Stay: Payer: Medicaid Other | Attending: Oncology | Admitting: Oncology

## 2018-04-08 ENCOUNTER — Other Ambulatory Visit: Payer: Self-pay

## 2018-04-08 ENCOUNTER — Inpatient Hospital Stay: Payer: Medicaid Other

## 2018-04-08 VITALS — BP 130/97 | HR 114 | Temp 99.9°F | Resp 18 | Wt 236.0 lb

## 2018-04-08 DIAGNOSIS — G893 Neoplasm related pain (acute) (chronic): Secondary | ICD-10-CM | POA: Insufficient documentation

## 2018-04-08 DIAGNOSIS — C778 Secondary and unspecified malignant neoplasm of lymph nodes of multiple regions: Secondary | ICD-10-CM | POA: Insufficient documentation

## 2018-04-08 DIAGNOSIS — E860 Dehydration: Secondary | ICD-10-CM | POA: Insufficient documentation

## 2018-04-08 DIAGNOSIS — G4700 Insomnia, unspecified: Secondary | ICD-10-CM | POA: Diagnosis not present

## 2018-04-08 DIAGNOSIS — C539 Malignant neoplasm of cervix uteri, unspecified: Secondary | ICD-10-CM

## 2018-04-08 DIAGNOSIS — F1911 Other psychoactive substance abuse, in remission: Secondary | ICD-10-CM | POA: Insufficient documentation

## 2018-04-08 DIAGNOSIS — Z5111 Encounter for antineoplastic chemotherapy: Secondary | ICD-10-CM | POA: Diagnosis present

## 2018-04-08 DIAGNOSIS — R109 Unspecified abdominal pain: Secondary | ICD-10-CM | POA: Diagnosis not present

## 2018-04-08 DIAGNOSIS — R112 Nausea with vomiting, unspecified: Secondary | ICD-10-CM | POA: Diagnosis not present

## 2018-04-08 DIAGNOSIS — D509 Iron deficiency anemia, unspecified: Secondary | ICD-10-CM | POA: Diagnosis not present

## 2018-04-08 DIAGNOSIS — D473 Essential (hemorrhagic) thrombocythemia: Secondary | ICD-10-CM | POA: Insufficient documentation

## 2018-04-08 DIAGNOSIS — F419 Anxiety disorder, unspecified: Secondary | ICD-10-CM | POA: Diagnosis not present

## 2018-04-08 DIAGNOSIS — I749 Embolism and thrombosis of unspecified artery: Secondary | ICD-10-CM

## 2018-04-08 LAB — COMPREHENSIVE METABOLIC PANEL
ALT: 17 U/L (ref 0–44)
AST: 18 U/L (ref 15–41)
Albumin: 3.3 g/dL — ABNORMAL LOW (ref 3.5–5.0)
Alkaline Phosphatase: 122 U/L (ref 38–126)
Anion gap: 11 (ref 5–15)
BUN: 6 mg/dL (ref 6–20)
CO2: 25 mmol/L (ref 22–32)
Calcium: 9.6 mg/dL (ref 8.9–10.3)
Chloride: 101 mmol/L (ref 98–111)
Creatinine, Ser: 0.51 mg/dL (ref 0.44–1.00)
GFR calc Af Amer: >60 mL/min (ref >60)
GFR calc non Af Amer: >60 mL/min (ref >60)
GLUCOSE: 86 mg/dL (ref 70–99)
Potassium: 4.4 mmol/L (ref 3.5–5.1)
SODIUM: 137 mmol/L (ref 135–145)
Total Bilirubin: 0.4 mg/dL (ref 0.3–1.2)
Total Protein: 9.6 g/dL — ABNORMAL HIGH (ref 6.5–8.1)

## 2018-04-08 LAB — CBC WITH DIFFERENTIAL/PLATELET
Abs Immature Granulocytes: 0.05 10*3/uL (ref 0.00–0.07)
Basophils Absolute: 0.1 10*3/uL (ref 0.0–0.1)
Basophils Relative: 1 %
Eosinophils Absolute: 0.1 10*3/uL (ref 0.0–0.5)
Eosinophils Relative: 1 %
HEMATOCRIT: 35.8 % — AB (ref 36.0–46.0)
Hemoglobin: 10.8 g/dL — ABNORMAL LOW (ref 12.0–15.0)
Immature Granulocytes: 0 %
Lymphocytes Relative: 27 %
Lymphs Abs: 3.1 10*3/uL (ref 0.7–4.0)
MCH: 24 pg — ABNORMAL LOW (ref 26.0–34.0)
MCHC: 30.2 g/dL (ref 30.0–36.0)
MCV: 79.6 fL — ABNORMAL LOW (ref 80.0–100.0)
MONO ABS: 1 10*3/uL (ref 0.1–1.0)
Monocytes Relative: 9 %
Neutro Abs: 6.9 10*3/uL (ref 1.7–7.7)
Neutrophils Relative %: 62 %
Platelets: 712 10*3/uL — ABNORMAL HIGH (ref 150–400)
RBC: 4.5 MIL/uL (ref 3.87–5.11)
RDW: 15.5 % (ref 11.5–15.5)
WBC: 11.3 10*3/uL — ABNORMAL HIGH (ref 4.0–10.5)
nRBC: 0 % (ref 0.0–0.2)

## 2018-04-08 MED ORDER — KETOROLAC TROMETHAMINE 15 MG/ML IJ SOLN
30.0000 mg | Freq: Once | INTRAMUSCULAR | Status: AC
  Administered 2018-04-08: 30 mg via INTRAVENOUS
  Filled 2018-04-08: qty 2

## 2018-04-08 NOTE — Patient Instructions (Signed)
It was nice to meet you today.  Please pick up ibuprofen and Tylenol OTC from your pharmacy.  Take 600-800 mg ibuprofen every 8 hours with 500 mg Tylenol.  You can take an additional 500 mg Tylenol in between this dosing for a total of 3000 mg Tylenol and 3200 mg ibuprofen.  We will call you this evening to touch base to see how your pain is. Also you received 30 mg Toradol in clinic today. I will call you with results from your labs. I have provided some educational materials with regards to new medications.  There also some great techniques for insomnia.   Insomnia Insomnia is a sleep disorder that makes it difficult to fall asleep or stay asleep. Insomnia can cause fatigue, low energy, difficulty concentrating, mood swings, and poor performance at work or school. There are three different ways to classify insomnia:  Difficulty falling asleep.  Difficulty staying asleep.  Waking up too early in the morning. Any type of insomnia can be long-term (chronic) or short-term (acute). Both are common. Short-term insomnia usually lasts for three months or less. Chronic insomnia occurs at least three times a week for longer than three months. What are the causes? Insomnia may be caused by another condition, situation, or substance, such as:  Anxiety.  Certain medicines.  Gastroesophageal reflux disease (GERD) or other gastrointestinal conditions.  Asthma or other breathing conditions.  Restless legs syndrome, sleep apnea, or other sleep disorders.  Chronic pain.  Menopause.  Stroke.  Abuse of alcohol, tobacco, or illegal drugs.  Mental health conditions, such as depression.  Caffeine.  Neurological disorders, such as Alzheimer's disease.  An overactive thyroid (hyperthyroidism). Sometimes, the cause of insomnia may not be known. What increases the risk? Risk factors for insomnia include:  Gender. Women are affected more often than men.  Age. Insomnia is more common as  you get older.  Stress.  Lack of exercise.  Irregular work schedule or working night shifts.  Traveling between different time zones.  Certain medical and mental health conditions. What are the signs or symptoms? If you have insomnia, the main symptom is having trouble falling asleep or having trouble staying asleep. This may lead to other symptoms, such as:  Feeling fatigued or having low energy.  Feeling nervous about going to sleep.  Not feeling rested in the morning.  Having trouble concentrating.  Feeling irritable, anxious, or depressed. How is this diagnosed? This condition may be diagnosed based on:  Your symptoms and medical history. Your health care provider may ask about: ? Your sleep habits. ? Any medical conditions you have. ? Your mental health.  A physical exam. How is this treated? Treatment for insomnia depends on the cause. Treatment may focus on treating an underlying condition that is causing insomnia. Treatment may also include:  Medicines to help you sleep.  Counseling or therapy.  Lifestyle adjustments to help you sleep better. Follow these instructions at home: Eating and drinking   Limit or avoid alcohol, caffeinated beverages, and cigarettes, especially close to bedtime. These can disrupt your sleep.  Do not eat a large meal or eat spicy foods right before bedtime. This can lead to digestive discomfort that can make it hard for you to sleep. Sleep habits   Keep a sleep diary to help you and your health care provider figure out what could be causing your insomnia. Write down: ? When you sleep. ? When you wake up during the night. ? How well you sleep. ? How  rested you feel the next day. ? Any side effects of medicines you are taking. ? What you eat and drink.  Make your bedroom a dark, comfortable place where it is easy to fall asleep. ? Put up shades or blackout curtains to block light from outside. ? Use a white noise machine to  block noise. ? Keep the temperature cool.  Limit screen use before bedtime. This includes: ? Watching TV. ? Using your smartphone, tablet, or computer.  Stick to a routine that includes going to bed and waking up at the same times every day and night. This can help you fall asleep faster. Consider making a quiet activity, such as reading, part of your nighttime routine.  Try to avoid taking naps during the day so that you sleep better at night.  Get out of bed if you are still awake after 15 minutes of trying to sleep. Keep the lights down, but try reading or doing a quiet activity. When you feel sleepy, go back to bed. General instructions  Take over-the-counter and prescription medicines only as told by your health care provider.  Exercise regularly, as told by your health care provider. Avoid exercise starting several hours before bedtime.  Use relaxation techniques to manage stress. Ask your health care provider to suggest some techniques that may work well for you. These may include: ? Breathing exercises. ? Routines to release muscle tension. ? Visualizing peaceful scenes.  Make sure that you drive carefully. Avoid driving if you feel very sleepy.  Keep all follow-up visits as told by your health care provider. This is important. Contact a health care provider if:  You are tired throughout the day.  You have trouble in your daily routine due to sleepiness.  You continue to have sleep problems, or your sleep problems get worse. Get help right away if:  You have serious thoughts about hurting yourself or someone else. If you ever feel like you may hurt yourself or others, or have thoughts about taking your own life, get help right away. You can go to your nearest emergency department or call:  Your local emergency services (911 in the [place]  A suicide crisis helpline, such as the [facility] at 586-500-2828. This is open 24 hours a  day. Summary  Insomnia is a sleep disorder that makes it difficult to fall asleep or stay asleep.  Insomnia can be long-term (chronic) or short-term (acute).  Treatment for insomnia depends on the cause. Treatment may focus on treating an underlying condition that is causing insomnia.  Keep a sleep diary to help you and your health care provider figure out what could be causing your insomnia. This information is not intended to replace advice given to you by your health care provider. Make sure you discuss any questions you have with your health care provider. Document Released: 03/20/2000 Document Revised: 12/31/2016 Document Reviewed: 12/31/2016 Elsevier Interactive Patient Education  2019 [place].   Ketorolac injection What is this medicine? KETOROLAC (kee toe ROLE ak) is a non-steroidal anti-inflammatory drug (NSAID). It is used to treat moderate to severe pain for up to 5 days. It is commonly used after surgery. This medicine should not be used for more than 5 days. This medicine may be used for other purposes; ask your health care provider or pharmacist if you have questions. COMMON BRAND NAME(S): Toradol What should I tell my health care provider before I take this medicine? They need to know if you have any of  these conditions: -asthma, especially aspirin-sensitive asthma -bleeding problems -coronary artery bypass graft (CABG) surgery within the past 2 weeks -kidney disease -stomach bleed, ulcer, or other problem -taking aspirin, other NSAID, or probenecid -an unusual or allergic reaction to ketorolac, tromethamine, aspirin, other NSAIDs, other medicines, foods, dyes or preservatives -pregnant or trying to get pregnant -breast-feeding How should I use this medicine? This medicine is for injection into a muscle or into a vein. It is given by a health care professional in a hospital or clinic setting. Talk to your pediatrician regarding the use of this medicine in  children. Special care may be needed. Patients over 53 years old may have a stronger reaction and need a smaller dose. Overdosage: If you think you have taken too much of this medicine contact a poison control center or emergency room at once. NOTE: This medicine is only for you. Do not share this medicine with others. What if I miss a dose? This does not apply. What may interact with this medicine? Do not take this medicine with any of the following medications: -aspirin and aspirin-like medicines -cidofovir -methotrexate -NSAIDs, medicines for pain and inflammation, like ibuprofen or naproxen -pentoxifylline -probenecid This medicine may also interact with the following medications: -alcohol -alendronate -alprazolam -carbamazepine -diuretics -flavocoxid -fluoxetine -ginkgo -lithium -medicines for blood pressure like enalapril -medicines that affect platelets like pentoxifylline -medicines that treat or prevent blood clots like heparin, warfarin -muscle relaxants -pemetrexed -phenytoin -thiothixene This list may not describe all possible interactions. Give your health care provider a list of all the medicines, herbs, non-prescription drugs, or dietary supplements you use. Also tell them if you smoke, drink alcohol, or use illegal drugs. Some items may interact with your medicine. What should I watch for while using this medicine? Tell your doctor or healthcare professional if your symptoms do not start to get better or if they get worse. This medicine does not prevent heart attack or stroke. In fact, this medicine may increase the chance of a heart attack or stroke. The chance may increase with longer use of this medicine and in people who have heart disease. If you take aspirin to prevent heart attack or stroke, talk with your doctor or health care professional. Do not take medicines such as ibuprofen and naproxen with this medicine. Side effects such as stomach upset, nausea, or  ulcers may be more likely to occur. Many medicines available without a prescription should not be taken with this medicine. This medicine can cause ulcers and bleeding in the stomach and intestines at any time during treatment. Do not smoke cigarettes or drink alcohol. These increase irritation to your stomach and can make it more susceptible to damage from this medicine. Ulcers and bleeding can happen without warning symptoms and can cause death. This medicine can cause you to bleed more easily. Try to avoid damage to your teeth and gums when you brush or floss your teeth. What side effects may I notice from receiving this medicine? Side effects that you should report to your doctor or health care professional as soon as possible: -allergic reactions like skin rash, itching or hives, swelling of the face, lips, or tongue -breathing problems -high blood pressure -nausea, vomiting -redness, blistering, peeling or loosening of the skin, including inside the mouth -severe stomach pain -signs and symptoms of bleeding such as bloody or black, tarry stools; red or dark-brown urine; spitting up blood or brown material that looks like coffee grounds; red spots on the skin; unusual bruising or bleeding  from the eye, gums, or nose -signs and symptoms of a blood clot changes in vision; chest pain; severe, sudden headache; trouble speaking; sudden numbness or weakness of the face, arm, or leg -trouble passing urine or change in the amount of urine -unexplained weight gain or swelling -unusually weak or tired -yellowing of eyes or skin Side effects that usually do not require medical attention (report to your doctor or health care professional if they continue or are bothersome): -diarrhea -dizziness -headache -heartburn This list may not describe all possible side effects. Call your doctor for medical advice about side effects. You may report side effects to FDA at 1-800-FDA-1088. Where should I keep my  medicine? This drug is given in a hospital or clinic and will not be stored at home. NOTE: This sheet is a summary. It may not cover all possible information. If you have questions about this medicine, talk to your doctor, pharmacist, or health care provider.  2019 Elsevier/Gold Standard (2016-11-25 15:19:19)  Acetaminophen tablets or caplets What is this medicine? ACETAMINOPHEN (a set a MEE noe fen) is a pain reliever. It is used to treat mild pain and fever. This medicine may be used for other purposes; ask your health care provider or pharmacist if you have questions. COMMON BRAND NAME(S): Aceta, Actamin, Anacin Aspirin Free, Genapap, Genebs, Mapap, Pain & Fever, Pain and Fever, PAIN RELIEF, PAIN RELIEF Extra Strength, Pain Reliever, Panadol, PHARBETOL, Q-Pap, Q-Pap Extra Strength, Tylenol, Tylenol CrushableTablet, Tylenol Extra Strength, XS No Aspirin, XS Pain Reliever What should I tell my health care provider before I take this medicine? They need to know if you have any of these conditions: -if you often drink alcohol -liver disease -an unusual or allergic reaction to acetaminophen, other medicines, foods, dyes, or preservatives -pregnant or trying to get pregnant -breast-feeding How should I use this medicine? Take this medicine by mouth with a glass of water. Follow the directions on the package or prescription label. Take your medicine at regular intervals. Do not take your medicine more often than directed. Talk to your pediatrician regarding the use of this medicine in children. While this drug may be prescribed for children as young as 73 years of age for selected conditions, precautions do apply. Overdosage: If you think you have taken too much of this medicine contact a poison control center or emergency room at once. NOTE: This medicine is only for you. Do not share this medicine with others. What if I miss a dose? If you miss a dose, take it as soon as you can. If it is almost  time for your next dose, take only that dose. Do not take double or extra doses. What may interact with this medicine? -alcohol -imatinib -isoniazid -other medicines with acetaminophen This list may not describe all possible interactions. Give your health care provider a list of all the medicines, herbs, non-prescription drugs, or dietary supplements you use. Also tell them if you smoke, drink alcohol, or use illegal drugs. Some items may interact with your medicine. What should I watch for while using this medicine? Tell your doctor or health care professional if the pain lasts more than 10 days (5 days for children), if it gets worse, or if there is a new or different kind of pain. Also, check with your doctor if a fever lasts for more than 3 days. Do not take other medicines that contain acetaminophen with this medicine. Always read labels carefully. If you have questions, ask your doctor or pharmacist. If  you take too much acetaminophen get medical help right away. Too much acetaminophen can be very dangerous and cause liver damage. Even if you do not have symptoms, it is important to get help right away. What side effects may I notice from receiving this medicine? Side effects that you should report to your doctor or health care professional as soon as possible: -allergic reactions like skin rash, itching or hives, swelling of the face, lips, or tongue -breathing problems -fever or sore throat -redness, blistering, peeling or loosening of the skin, including inside the mouth -trouble passing urine or change in the amount of urine -unusual bleeding or bruising -unusually weak or tired -yellowing of the eyes or skin Side effects that usually do not require medical attention (report to your doctor or health care professional if they continue or are bothersome): -headache -nausea, stomach upset This list may not describe all possible side effects. Call your doctor for medical advice about  side effects. You may report side effects to FDA at 1-800-FDA-1088. Where should I keep my medicine? Keep out of reach of children. Store at room temperature between 20 and 25 degrees C (68 and 77 degrees F). Protect from moisture and heat. Throw away any unused medicine after the expiration date. NOTE: This sheet is a summary. It may not cover all possible information. If you have questions about this medicine, talk to your doctor, pharmacist, or health care provider.  2019 Elsevier/Gold Standard (2012-11-14 12:54:16) Ibuprofen tablets and capsules What is this medicine? IBUPROFEN (eye BYOO proe fen) is a non-steroidal anti-inflammatory drug (NSAID). It is used for dental pain, fever, headaches or migraines, osteoarthritis, rheumatoid arthritis, or painful monthly periods. It can also relieve minor aches and pains caused by a cold, flu, or sore throat. This medicine may be used for other purposes; ask your health care provider or pharmacist if you have questions. COMMON BRAND NAME(S): Advil, Advil Junior Strength, Advil Migraine, Genpril, Ibren, IBU, Midol, Midol Cramps and Body Aches, Motrin, Motrin IB, Motrin Junior Strength, Motrin Migraine Pain, Samson-8, Toxicology Saliva Collection What should I tell my health care provider before I take this medicine? They need to know if you have any of these conditions: -cigarette smoker -coronary artery bypass graft (CABG) surgery within the past 2 weeks -drink more than 3 alcohol-containing drinks a day -heart disease -high blood pressure -history of stomach bleeding -kidney disease -liver disease -lung or breathing disease, like asthma -an unusual or allergic reaction to ibuprofen, aspirin, other NSAIDs, other medicines, foods, dyes, or preservatives -pregnant or trying to get pregnant -breast-feeding How should I use this medicine? Take this medicine by mouth with a glass of water. Follow the directions on the prescription label. Take this  medicine with food if your stomach gets upset. Try to not lie down for at least 10 minutes after you take the medicine. Take your medicine at regular intervals. Do not take your medicine more often than directed. A special MedGuide will be given to you by the pharmacist with each prescription and refill. Be sure to read this information carefully each time. Talk to your pediatrician regarding the use of this medicine in children. Special care may be needed. Overdosage: If you think you have taken too much of this medicine contact a poison control center or emergency room at once. NOTE: This medicine is only for you. Do not share this medicine with others. What if I miss a dose? If you miss a dose, take it as soon as you can.  If it is almost time for your next dose, take only that dose. Do not take double or extra doses. What may interact with this medicine? Do not take this medicine with any of the following medications: -cidofovir -ketorolac -methotrexate -pemetrexed This medicine may also interact with the following medications: -alcohol -aspirin -diuretics -lithium -other drugs for inflammation like prednisone -warfarin This list may not describe all possible interactions. Give your health care provider a list of all the medicines, herbs, non-prescription drugs, or dietary supplements you use. Also tell them if you smoke, drink alcohol, or use illegal drugs. Some items may interact with your medicine. What should I watch for while using this medicine? Tell your doctor or healthcare professional if your symptoms do not start to get better or if they get worse. This medicine does not prevent heart attack or stroke. In fact, this medicine may increase the chance of a heart attack or stroke. The chance may increase with longer use of this medicine and in people who have heart disease. If you take aspirin to prevent heart attack or stroke, talk with your doctor or health care professional. Do  not take other medicines that contain aspirin, ibuprofen, or naproxen with this medicine. Side effects such as stomach upset, nausea, or ulcers may be more likely to occur. Many medicines available without a prescription should not be taken with this medicine. This medicine can cause ulcers and bleeding in the stomach and intestines at any time during treatment. Ulcers and bleeding can happen without warning symptoms and can cause death. To reduce your risk, do not smoke cigarettes or drink alcohol while you are taking this medicine. You may get drowsy or dizzy. Do not drive, use machinery, or do anything that needs mental alertness until you know how this medicine affects you. Do not stand or sit up quickly, especially if you are an older patient. This reduces the risk of dizzy or fainting spells. This medicine can cause you to bleed more easily. Try to avoid damage to your teeth and gums when you brush or floss your teeth. This medicine may be used to treat migraines. If you take migraine medicines for 10 or more days a month, your migraines may get worse. Keep a diary of headache days and medicine use. Contact your healthcare professional if your migraine attacks occur more frequently. What side effects may I notice from receiving this medicine? Side effects that you should report to your doctor or health care professional as soon as possible: -allergic reactions like skin rash, itching or hives, swelling of the face, lips, or tongue -severe stomach pain -signs and symptoms of bleeding such as bloody or black, tarry stools; red or dark-brown urine; spitting up blood or brown material that looks like coffee grounds; red spots on the skin; unusual bruising or bleeding from the eye, gums, or nose -signs and symptoms of a blood clot such as changes in vision; chest pain; severe, sudden headache; trouble speaking; sudden numbness or weakness of the face, arm, or leg -unexplained weight gain or  swelling -unusually weak or tired -yellowing of eyes or skin Side effects that usually do not require medical attention (report to your doctor or health care professional if they continue or are bothersome): -bruising -diarrhea -dizziness, drowsiness -headache -nausea, vomiting This list may not describe all possible side effects. Call your doctor for medical advice about side effects. You may report side effects to FDA at 1-800-FDA-1088. Where should I keep my medicine? Keep out of the  reach of children. Store at room temperature between 15 and 30 degrees C (59 and 86 degrees F). Keep container tightly closed. Throw away any unused medicine after the expiration date. NOTE: This sheet is a summary. It may not cover all possible information. If you have questions about this medicine, talk to your doctor, pharmacist, or health care provider.  2019 Elsevier/Gold Standard (2016-11-25 12:43:57)

## 2018-04-08 NOTE — Progress Notes (Signed)
Symptom Management Consult note [facility]  [phone] [phone]  Patient Care Team: [name], MD as PCP - General (Family Medicine) Clent Jacks, RN as Registered Nurse   Name of the patient: Kristy Gordon  [id number]  12-Sep-1985   Date of visit: 04/08/2018  Diagnosis: Cervical cancer  Chief Complaint: Left-sided pelvic pain  Current Treatment: Concurrent chemoradiation scheduled to begin on 04/11/2018  Oncology History: Patient was last seen by primary medical oncologist Dr. Janese Banks on 04/01/2018 to discuss PET/CT scan imaging and management.  She was highly anxious given recent PET scan results and diagnosis.  Admitted to chronic insomnia and was previously prescribed 0.5 mg Xanax which did not appear to be helping much. Her Xanax was increased from 0.5 to 1 mg every 8 hours for anxiety.  She was advised to begin drinking ensures 1-2 drinks a day to keep up with her nutritional intake secondary to weight loss.  Scheduled to return to clinic on 04/11/2018 for labs, assessment prior to cycle 1 of weekly cisplatin and to begin daily radiation on 04/12/2018.  She was seen by Dr. Donella Stade and radiation oncology on 04/01/2018.  Results from PET scan revealed a 6 cm cervical mass consistent with known carcinoma.  Mild hypermetabolic bilateral parametrial right pararectal and bilateral iliac lymph nodes consistent with metastatic disease.  It was recommended for concurrent chemoradiation with both external beam treatment as well as brachii therapy.  Had port placed by Dr. Rosana Hoes on 03/31/2018.  She was seen by Dr, Fransisca Connors on 03/23/18.  Exam revealed cancer/tumor that was entirely replacing her cervix with possible ureteral obstruction.  Oncology History    -patient is a 33 year old female G69 who initially presented to the ER on 03/15/2018 with symptoms of abdominal pain and cramping. She has also been having irregular menstrual bleeding and  spotting on and off for the last few months.She had an ultrasound pelvis done in the ER which showed a large 5.8 cm hypoechoic vascular mass within the cervix and the lower uterine segment. Patient had last seen GYN in 2013 and had not had a Pap smear done since then. Patient was seen by GYN Dr. Nechama Guard on 1213 and was found to have firm friable cervix and multiple biopsies were taken which showed adenocarcinoma. She was then seen by Dr. Fransisca Connors from GYN oncology. Pelvic exam showed anteverted cervix that was replaced by tumor and extension of cancer into the left parametrium and possibly to the pelvic sidewall.   PET CT scan on 03/28/2018 showed 6 cm hypermetabolic cervical mass consistent with primary cervical carcinoma.  Mild hypermetabolic bilateral parametrial right perirectal bilateral iliac lymph nodes consistent with metastatic disease.  No evidence of metastatic disease within the abdomen chest or neck.  Plan is to proceed with concurrent chemoradiation with weekly cisplatin     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[place])   03/23/2018 Initial Diagnosis    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[place])    04/01/2018 Cancer Staging    Staging form: Cervix Uteri, AJCC 8th Edition - Clinical stage from 04/01/2018: FIGO Stage III (cT3, cN1, cM0) - Signed by Sindy Guadeloupe, MD on 04/01/2018    04/11/2018 -  Chemotherapy    The patient had PALONOSETRON HCL INJECTION 0.25 MG/5ML, 0.25 mg, Intravenous,  Once, 0 of 4 cycles CISplatin (PLATINOL) 90 mg in sodium chloride 0.9 % 250 mL chemo infusion, 40 mg/m2, Intravenous,  Once, 0 of 4 cycles FOSAPREPITANT 150MG  + DEXAMETHASONE INFUSION [facility], , Intravenous,  Once,  0 of 4 cycles  for chemotherapy treatment.      Subjective Data:  ECOG: 1 - Symptomatic but completely ambulatory  Subjective:     Kristy Gordon is a 33 y.o. female who presents for evaluation of abdominal pain. The pain is described as shooting and stabbing, and is 9/10 in intensity. Pain is located in  the LLQ area without radiation. Onset was insidious occurring a few day ago. Symptoms have been gradually worsening since. Aggravating factors: none. Alleviating factors: acetaminophen and NSAIDs. Associated symptoms: anorexia. The patient denies constipation, diarrhea, dysuria, headache, hematuria, melena, nausea, sweats and vomiting. Risk factors for pelvic/abdominal pain include Recent diagnosis of cervical cancer with 6 cm cervical mass.  Menstrual History: OB History    Gravida  10   Para  2   Term      Preterm  2   AB  8   Living  0     SAB  8   TAB      Ectopic      Multiple      Live Births  0           Menarche age: 52 No LMP recorded.   The following portions of the patient's history were reviewed and updated as appropriate: allergies, current medications, past family history, past medical history, past social history, past surgical history and problem list.   Review of Systems A comprehensive review of systems was negative except for: Constitutional: positive for fatigue and fevers Gastrointestinal: positive for abdominal pain Genitourinary: positive for abnormal menstrual periods, vaginal discharge and blood clots    Objective:    BP (!) 130/97 (BP Location: Left Arm, Patient Position: Sitting)   Pulse (!) 114   Temp 99.9 F (37.7 C) (Tympanic)   Resp 18   Wt 236 lb (107 kg)   BMI 36.96 kg/m  General:   alert, fatigued, no distress, mildly obese and pale  Lungs:   clear to auscultation bilaterally  Heart:   regular rate and rhythm, S1, S2 normal, no murmur, click, rub or gallop  Abdomen:  abnormal findings:  obese and moderate tenderness in the LLQ and in the lower abdomen  CVA:   absent  Pelvis:  Exam deferred.  Extremities:   extremities normal, atraumatic, no cyanosis or edema  Neurologic:   negative  Psychiatric:   normal mood, behavior, speech, dress, and thought processes   Lab Review Labs: CBC and CMP   Imaging Previous imaging in  chart. No need to additional imaging today.      Assessment:    Pelvic pain d/t known cervical cancer.    Plan:    Stage IIIb cervical cancer: With recent diagnosis in December 2019.  Plan is for concurrent chemoradiation beginning on 04/11/2018 followed by vaginal brachii therapy.  Had port placed by Dr. Rosana Hoes on 03/31/2018.  Scheduled to return to clinic on 04/11/2018 for evaluation and assessment prior to cycle 1 of weekly cisplatin and 04/12/2018 2 began radiation.  Pelvic pain: New.  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  Unfortunately, patient and husband have been unable to afford methadone clinic fees/dues and she has not received her prescribed methadone.  She is taking her husband's methadone 15mg .  Left lower quadrant pelvic pain has been intermittent worsening x1 week.  Has been taking 3 to 4 g of Tylenol daily.  States it is worse at night.  Husband called yesterday stating "she is writhing in pain  and needs something other than Tylenol".  Asked to come in to be seen in [facility] for further evaluation.   Sizable blood clot: One episode yesterday.  Likely from friable cervix.  Continue to monitor.  Mild spotting since.  Will get stat CBC. Hemoglobin stable.  Insomnia: Continue 1 mg Xanax 3 times daily as prescribed by Dr. Janese Banks.  Plan: Spoke at length with Josh borders, palliative nurse practitioner with regards to pain management for this complicated case. Will get stat labs (CBC, CMP).  Thrombocytosis leukocytosis likely reactive.  Hemoglobin stable. Give 30 mg IV Toradol in clinic. Give 1 L NaCl. Recommend 3 times daily dosing of 500 mg acetaminophen and 600 to 800 mg ibuprofen around-the-clock not to exceed 3-4 g Tylenol and 3200 mg ibuprofen.  Instructions printed and attached to AVS. Will try to locate a pain clinic where she can be managed appropriately.  Have arranged for her to be seen by Clydell Hakim, MD at [facility] neurological and spine.  First  available dates are 05/12/2018 and 05/17/2018.  This would require a $200 upfront fee.  Patient is uncertain if she will be able to afford this. Referral placed and they should be calling for an appt. I do not recommend her continued use of her husband's methadone.  She understands. She is scheduled to be seen by Elease Etienne on Tuesday, 04/12/2018 for financial assistance. Patient may need narcotics in the future but I do not feel comfortable managing.  We will speak with primary oncologist Dr. Janese Banks on Monday.  Addendum: called patient around 4 pm on 04/08/18. Patient feeling much better. Pain currently controlled. Instructed to call clinic if symptoms worsen.   Greater than 50% was spent in counseling and coordination of care with this patient including but not limited to discussion of the relevant topics above (See A&P) including, but not limited to diagnosis and management of acute and chronic medical conditions.   Faythe Casa, NP 04/08/2018 3:16 PM

## 2018-04-10 ENCOUNTER — Telehealth: Payer: Self-pay | Admitting: Oncology

## 2018-04-10 NOTE — Telephone Encounter (Signed)
Husband called and reports patient has abdominal pain.  patient with FIGO stage IIIC cervical cancer who recently established care with Dr. Janese Banks, planning concurrent chemoradiation starting next week.  She was seen by nurse practitioner this past Friday and got Toradol. Per husband patient has tried a Tylenol and ibuprofen which did not help, still having severe ;pwer abdominal pain. Advised patient to go to emergency room for evaluation of abdominal pain and management.  Husband voices understanding.

## 2018-04-11 ENCOUNTER — Inpatient Hospital Stay: Payer: Medicaid Other

## 2018-04-11 ENCOUNTER — Other Ambulatory Visit: Payer: Self-pay

## 2018-04-11 ENCOUNTER — Inpatient Hospital Stay (HOSPITAL_BASED_OUTPATIENT_CLINIC_OR_DEPARTMENT_OTHER): Payer: Medicaid Other | Admitting: Oncology

## 2018-04-11 ENCOUNTER — Encounter: Payer: Self-pay | Admitting: Oncology

## 2018-04-11 ENCOUNTER — Other Ambulatory Visit: Payer: Self-pay | Admitting: *Deleted

## 2018-04-11 VITALS — BP 131/89 | HR 98 | Temp 98.3°F | Resp 18 | Wt 236.2 lb

## 2018-04-11 DIAGNOSIS — G893 Neoplasm related pain (acute) (chronic): Secondary | ICD-10-CM

## 2018-04-11 DIAGNOSIS — Z5111 Encounter for antineoplastic chemotherapy: Secondary | ICD-10-CM | POA: Diagnosis not present

## 2018-04-11 DIAGNOSIS — F419 Anxiety disorder, unspecified: Secondary | ICD-10-CM

## 2018-04-11 DIAGNOSIS — D75839 Thrombocytosis, unspecified: Secondary | ICD-10-CM

## 2018-04-11 DIAGNOSIS — N939 Abnormal uterine and vaginal bleeding, unspecified: Secondary | ICD-10-CM

## 2018-04-11 DIAGNOSIS — R109 Unspecified abdominal pain: Secondary | ICD-10-CM

## 2018-04-11 DIAGNOSIS — Z720 Tobacco use: Secondary | ICD-10-CM

## 2018-04-11 DIAGNOSIS — C539 Malignant neoplasm of cervix uteri, unspecified: Secondary | ICD-10-CM

## 2018-04-11 DIAGNOSIS — D509 Iron deficiency anemia, unspecified: Secondary | ICD-10-CM | POA: Insufficient documentation

## 2018-04-11 DIAGNOSIS — D473 Essential (hemorrhagic) thrombocythemia: Secondary | ICD-10-CM

## 2018-04-11 DIAGNOSIS — D500 Iron deficiency anemia secondary to blood loss (chronic): Secondary | ICD-10-CM

## 2018-04-11 DIAGNOSIS — C778 Secondary and unspecified malignant neoplasm of lymph nodes of multiple regions: Secondary | ICD-10-CM

## 2018-04-11 LAB — CBC WITH DIFFERENTIAL/PLATELET
ABS IMMATURE GRANULOCYTES: 0.05 10*3/uL (ref 0.00–0.07)
Basophils Absolute: 0.1 10*3/uL (ref 0.0–0.1)
Basophils Relative: 1 %
Eosinophils Absolute: 0.1 10*3/uL (ref 0.0–0.5)
Eosinophils Relative: 1 %
HCT: 30.6 % — ABNORMAL LOW (ref 36.0–46.0)
Hemoglobin: 9.2 g/dL — ABNORMAL LOW (ref 12.0–15.0)
Immature Granulocytes: 1 %
Lymphocytes Relative: 31 %
Lymphs Abs: 3.3 10*3/uL (ref 0.7–4.0)
MCH: 23.8 pg — ABNORMAL LOW (ref 26.0–34.0)
MCHC: 30.1 g/dL (ref 30.0–36.0)
MCV: 79.3 fL — ABNORMAL LOW (ref 80.0–100.0)
Monocytes Absolute: 1 10*3/uL (ref 0.1–1.0)
Monocytes Relative: 9 %
NEUTROS PCT: 57 %
Neutro Abs: 6.3 10*3/uL (ref 1.7–7.7)
Platelets: 654 10*3/uL — ABNORMAL HIGH (ref 150–400)
RBC: 3.86 MIL/uL — ABNORMAL LOW (ref 3.87–5.11)
RDW: 15.4 % (ref 11.5–15.5)
WBC: 10.8 10*3/uL — ABNORMAL HIGH (ref 4.0–10.5)
nRBC: 0 % (ref 0.0–0.2)

## 2018-04-11 LAB — COMPREHENSIVE METABOLIC PANEL
ALT: 11 U/L (ref 0–44)
AST: 13 U/L — ABNORMAL LOW (ref 15–41)
Albumin: 3.1 g/dL — ABNORMAL LOW (ref 3.5–5.0)
Alkaline Phosphatase: 99 U/L (ref 38–126)
Anion gap: 10 (ref 5–15)
BILIRUBIN TOTAL: 0.2 mg/dL — AB (ref 0.3–1.2)
BUN: 11 mg/dL (ref 6–20)
CO2: 25 mmol/L (ref 22–32)
Calcium: 8.8 mg/dL — ABNORMAL LOW (ref 8.9–10.3)
Chloride: 103 mmol/L (ref 98–111)
Creatinine, Ser: 0.67 mg/dL (ref 0.44–1.00)
GFR calc Af Amer: >60 mL/min (ref >60)
GFR calc non Af Amer: >60 mL/min (ref >60)
Glucose, Bld: 87 mg/dL (ref 70–99)
POTASSIUM: 4.1 mmol/L (ref 3.5–5.1)
Sodium: 138 mmol/L (ref 135–145)
Total Protein: 8.4 g/dL — ABNORMAL HIGH (ref 6.5–8.1)

## 2018-04-11 LAB — VITAMIN B12: Vitamin B-12: 461 pg/mL (ref 180–914)

## 2018-04-11 LAB — IRON AND TIBC
Iron: 9 ug/dL — ABNORMAL LOW (ref 28–170)
Saturation Ratios: 4 % — ABNORMAL LOW (ref 10.4–31.8)
TIBC: 249 ug/dL — ABNORMAL LOW (ref 250–450)
UIBC: 240 ug/dL

## 2018-04-11 LAB — FERRITIN: Ferritin: 180 ng/mL (ref 11–307)

## 2018-04-11 LAB — MAGNESIUM: Magnesium: 2.1 mg/dL (ref 1.7–2.4)

## 2018-04-11 LAB — PREGNANCY, URINE: Preg Test, Ur: NEGATIVE

## 2018-04-11 LAB — FOLATE: Folate: 12.1 ng/mL (ref >5.9)

## 2018-04-11 MED ORDER — KETOROLAC TROMETHAMINE 15 MG/ML IJ SOLN
30.0000 mg | Freq: Once | INTRAMUSCULAR | Status: AC
  Administered 2018-04-11: 30 mg via INTRAVENOUS
  Filled 2018-04-11: qty 2

## 2018-04-11 MED ORDER — SODIUM CHLORIDE 0.9% FLUSH
10.0000 mL | Freq: Once | INTRAVENOUS | Status: AC
  Administered 2018-04-11: 10 mL via INTRAVENOUS
  Filled 2018-04-11: qty 10

## 2018-04-11 MED ORDER — POTASSIUM CHLORIDE 2 MEQ/ML IV SOLN
Freq: Once | INTRAVENOUS | Status: AC
  Administered 2018-04-11: 10:00:00 via INTRAVENOUS
  Filled 2018-04-11: qty 1000

## 2018-04-11 MED ORDER — SODIUM CHLORIDE 0.9 % IV SOLN
40.0000 mg/m2 | Freq: Once | INTRAVENOUS | Status: AC
  Administered 2018-04-11: 90 mg via INTRAVENOUS
  Filled 2018-04-11: qty 90

## 2018-04-11 MED ORDER — SODIUM CHLORIDE 0.9 % IV SOLN
Freq: Once | INTRAVENOUS | Status: AC
  Administered 2018-04-11: 12:00:00 via INTRAVENOUS
  Filled 2018-04-11: qty 5

## 2018-04-11 MED ORDER — PANTOPRAZOLE SODIUM 20 MG PO TBEC: 20.0000 mg | DELAYED_RELEASE_TABLET | Freq: Every day | ORAL | 3 refills | Status: DC

## 2018-04-11 MED ORDER — PALONOSETRON HCL INJECTION 0.25 MG/5ML
0.2500 mg | Freq: Once | INTRAVENOUS | Status: AC
  Administered 2018-04-11: 0.25 mg via INTRAVENOUS
  Filled 2018-04-11: qty 5

## 2018-04-11 MED ORDER — SODIUM CHLORIDE 0.9 % IV SOLN
Freq: Once | INTRAVENOUS | Status: AC
  Administered 2018-04-11: 10:00:00 via INTRAVENOUS
  Filled 2018-04-11: qty 250

## 2018-04-11 MED ORDER — HEPARIN SOD (PORK) LOCK FLUSH 100 UNIT/ML IV SOLN
500.0000 [IU] | Freq: Once | INTRAVENOUS | Status: AC
  Administered 2018-04-11: 500 [IU] via INTRAVENOUS
  Filled 2018-04-11: qty 5

## 2018-04-11 NOTE — Progress Notes (Signed)
Here for follow up. " I feel pretty good " per pt   Concerned she will have nausea during tx -will discuss meds w Dr Janese Banks she stated.

## 2018-04-11 NOTE — Progress Notes (Signed)
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 Positive pelvic LN  Chief complaint/ Reason for visit- on treatment assessment prior to cycle 1 of cisplatin  Heme/Onc history: patient is a 33 year old female G13 who initially presented to the ER on 03/15/2018 with symptoms of abdominal pain and cramping. She has also been having irregular menstrual bleeding and spotting on and off for the last few months.She had an ultrasound pelvis done in the ER which showed a large 5.8 cm hypoechoic vascular mass within the cervix and the lower uterine segment. Patient had last seen GYN in 2013 and had not had a Pap smear done since then. Patient was seen by GYN Dr. Nechama Guard on 1213 and was found to have firm friable cervix and multiple biopsies were taken which showed adenocarcinoma. She was then seen by Dr. Fransisca Connors from GYN oncology. Pelvic exam showed anteverted cervix that was replaced by tumor and extension of cancer into the left parametrium and possibly to the pelvic sidewall.   PET CT scan on 03/28/2018 showed 6 cm hypermetabolic cervical mass consistent with primary cervical carcinoma.  Mild hypermetabolic bilateral parametrial right perirectal bilateral iliac lymph nodes consistent with metastatic disease.  No evidence of metastatic disease within the abdomen chest or neck.  Plan is to proceed with concurrent chemoradiation with weekly cisplatin   Interval history-reports having significant abdominal pain over the last 2 days.  She has also been having vaginal bleeding and passing clots.  She did receive a dose of Toradol on 04/08/2018 and it made her  feel better.  Xanax has been helping her with her anxiety  ECOG PS- 1 Pain scale- 0 Opioid associated constipation- no  Review of systems- Review of Systems  Constitutional: Positive for malaise/fatigue. Negative for chills, fever and weight loss.  HENT: Negative for congestion, ear discharge and nosebleeds.   Eyes: Negative for blurred vision.  Respiratory: Negative for cough, hemoptysis, sputum production, shortness of breath and wheezing.   Cardiovascular: Negative for chest pain, palpitations, orthopnea and claudication.  Gastrointestinal: Positive for abdominal pain. Negative for blood in stool, constipation, diarrhea, heartburn, melena, nausea and vomiting.  Genitourinary: Negative for dysuria, flank pain, frequency, hematuria and urgency.       Vaginal bleeding  Musculoskeletal: Negative for back pain, joint pain and myalgias.  Skin: Negative for rash.  Neurological: Negative for dizziness, tingling, focal weakness, seizures, weakness and headaches.  Endo/Heme/Allergies: Does not bruise/bleed easily.  Psychiatric/Behavioral: Negative for depression and suicidal ideas. The patient is nervous/anxious. The patient does not have insomnia.        Allergies  Allergen Reactions  . Amoxicillin Rash  . Penicillins Hives     Past Medical History:  Diagnosis Date  . Anxiety   . Cancer (HCC)    cervical cancer  . Hashimoto's thyroiditis   . Hypothyroid      Past Surgical History:  Procedure Laterality Date  . BUNIONECTOMY Right   . PORTA CATH INSERTION N/A 03/31/2018   Procedure: PORTA CATH INSERTION;  Surgeon: Algernon Huxley, MD;  Location: [facility];  Service: Cardiovascular;  Laterality: N/A;  . TONSILLECTOMY      Social History  Socioeconomic History  . Marital status: Married    Spouse name: Not on file  . Number of children: Not on file  . Years of education: Not on file  . Highest education level: Not on file  Occupational History  . Not on file    Social Needs  . Financial resource strain: Not on file  . Food insecurity:    Worry: Not on file    Inability: Not on file  . Transportation needs:    Medical: Not on file    Non-medical: Not on file  Tobacco Use  . Smoking status: Current Every Day Smoker    Packs/day: 1.00    Years: 10.00    Pack years: 10.00    Types: Cigarettes  . Smokeless tobacco: Never Used  Substance and Sexual Activity  . Alcohol use: Yes    Comment: rare  . Drug use: No    Comment: quit opiates 5 years ago  . Sexual activity: Yes    Birth control/protection: None  Lifestyle  . Physical activity:    Days per week: Not on file    Minutes per session: Not on file  . Stress: Not on file  Relationships  . Social connections:    Talks on phone: Not on file    Gets together: Not on file    Attends religious service: Not on file    Active member of club or organization: Not on file    Attends meetings of clubs or organizations: Not on file    Relationship status: Not on file  . Intimate partner violence:    Fear of current or ex partner: Not on file    Emotionally abused: Not on file    Physically abused: Not on file    Forced sexual activity: Not on file  Other Topics Concern  . Not on file  Social History Narrative  . Not on file    Family History  Adopted: Yes  Family history unknown: Yes     Current Outpatient Medications:  .  acetaminophen (TYLENOL) 500 MG tablet, Take 500 mg by mouth every 4 (four) hours as needed., Disp: , Rfl:  .  ALPRAZolam (XANAX) 1 MG tablet, Take 1 tablet (1 mg total) by mouth 3 (three) times daily as needed for anxiety., Disp: 90 tablet, Rfl: 0 .  citalopram (CELEXA) 40 MG tablet, Take 1 tablet (40 mg total) by mouth daily., Disp: , Rfl:  .  levothyroxine (SYNTHROID, LEVOTHROID) 112 MCG tablet, Take 2 tablets (224 mcg total) by mouth daily., Disp: 60 tablet, Rfl: 5 .  lidocaine-prilocaine (EMLA) cream, Apply to affected area once, Disp: 30 g, Rfl: 3 .   methadone (DOLOPHINE) 5 MG tablet, Take 15 mg by mouth daily. Once daily, Disp: , Rfl:  .  dexamethasone (DECADRON) 4 MG tablet, Take 2 tablets by mouth once a day on the day after chemotherapy and then take 2 tablets two times a day for 2 days. Take with food. (Patient not taking: Reported on 04/11/2018), Disp: 30 tablet, Rfl: 1 .  ondansetron (ZOFRAN) 8 MG tablet, Take 1 tablet (8 mg total) by mouth 2 (two) times daily as needed. Start on the third day after chemotherapy. (Patient not taking: Reported on 04/11/2018), Disp: 30 tablet, Rfl: 1 .  pantoprazole (PROTONIX) 20 MG tablet, Take 1 tablet (20 mg total) by mouth daily., Disp: 30 tablet, Rfl: 3 .  prochlorperazine (COMPAZINE) 10 MG tablet, Take 1 tablet (10 mg total) by mouth every 6 (six)  hours as needed (Nausea or vomiting). (Patient not taking: Reported on 04/11/2018), Disp: 30 tablet, Rfl: 1 No current facility-administered medications for this visit.   Facility-Administered Medications Ordered in Other Visits:  .  CISplatin (PLATINOL) 90 mg in sodium chloride 0.9 % 250 mL chemo infusion, 40 mg/m2 (Treatment Plan Recorded), Intravenous, Once, Sindy Guadeloupe, MD .  fosaprepitant (EMEND) 150 mg, dexamethasone (DECADRON) 12 mg in sodium chloride 0.9 % 145 mL IVPB, , Intravenous, Once, Sindy Guadeloupe, MD .  heparin lock flush 100 unit/mL, 500 Units, Intravenous, Once, Earlie Server, MD .  palonosetron (ALOXI) injection 0.25 mg, 0.25 mg, Intravenous, Once, Sindy Guadeloupe, MD  Physical exam:  Vitals:   04/11/18 0853  BP: 131/89  Pulse: 98  Resp: 18  Temp: 98.3 F (36.8 C)  TempSrc: Tympanic  Weight: 236 lb 3.2 oz (107.1 kg)   Physical Exam Constitutional:      General: She is not in acute distress. HENT:     Head: Normocephalic and atraumatic.  Eyes:     Pupils: Pupils are equal, round, and reactive to light.  Neck:     Musculoskeletal: Normal range of motion.  Cardiovascular:     Rate and Rhythm: Regular rhythm. Tachycardia present.      Heart sounds: Normal heart sounds.  Pulmonary:     Effort: Pulmonary effort is normal.     Breath sounds: Normal breath sounds.  Abdominal:     General: Bowel sounds are normal. There is no distension.     Palpations: Abdomen is soft.     Tenderness: There is no abdominal tenderness.  Skin:    General: Skin is warm and dry.  Neurological:     Mental Status: She is alert and oriented to person, place, and time.      CMP Latest Ref Rng & Units 04/11/2018  Glucose 70 - 99 mg/dL 87  BUN 6 - 20 mg/dL 11  Creatinine 0.44 - 1.00 mg/dL 0.67  Sodium 135 - 145 mmol/L 138  Potassium 3.5 - 5.1 mmol/L 4.1  Chloride 98 - 111 mmol/L 103  CO2 22 - 32 mmol/L 25  Calcium 8.9 - 10.3 mg/dL 8.8(L)  Total Protein 6.5 - 8.1 g/dL 8.4(H)  Total Bilirubin 0.3 - 1.2 mg/dL 0.2(L)  Alkaline Phos 38 - 126 U/L 99  AST 15 - 41 U/L 13(L)  ALT 0 - 44 U/L 11   CBC Latest Ref Rng & Units 04/11/2018  WBC 4.0 - 10.5 K/uL 10.8(H)  Hemoglobin 12.0 - 15.0 g/dL 9.2(L)  Hematocrit 36.0 - 46.0 % 30.6(L)  Platelets 150 - 400 K/uL 654(H)    No images are attached to the encounter.  US Pelvis Transvanginal Non-ob (tv Only)  Result Date: 03/15/2018 CLINICAL DATA:  Vaginal bleeding and pain EXAM: TRANSABDOMINAL AND TRANSVAGINAL ULTRASOUND OF PELVIS TECHNIQUE: Both transabdominal and transvaginal ultrasound examinations of the pelvis were performed. Transabdominal technique was performed for global imaging of the pelvis including uterus, ovaries, adnexal regions, and pelvic cul-de-sac. It was necessary to proceed with endovaginal exam following the transabdominal exam to visualize the uterus endometrium adnexa. COMPARISON:  None FINDINGS: Uterus Measurements: 9.6 cm length by 4.4 cm height by 4.6 cm wide = volume: 100.96 mL. Large hypoechoic mass within the cervix/lower uterine segment, this measures 5.8 x 4.6 x 5 cm and is vascular. Endometrium Thickness: 8.9 mm.  No focal abnormality visualized. Right ovary Measurements: 2.7  x 1.5 x 1.9 cm = volume: 4.1 mL. Normal appearance/no adnexal mass. Left ovary  Measurements: 2.3 x 1.2 x 1.4 cm = volume: 2 mL. Normal appearance/no adnexal mass. Other findings Trace free fluid. IMPRESSION: 1. Large 5.8 cm hypoechoic vascular mass within the cervix/lower uterine segment. Gynecologic consultation is recommended. 2. Trace free fluid in the pelvis. Otherwise negative pelvic ultrasound. Electronically Signed   By: Donavan Foil M.D.   On: 03/15/2018 18:06   US Pelvis (transabdominal Only)  Result Date: 03/15/2018 CLINICAL DATA:  Vaginal bleeding and pain EXAM: TRANSABDOMINAL AND TRANSVAGINAL ULTRASOUND OF PELVIS TECHNIQUE: Both transabdominal and transvaginal ultrasound examinations of the pelvis were performed. Transabdominal technique was performed for global imaging of the pelvis including uterus, ovaries, adnexal regions, and pelvic cul-de-sac. It was necessary to proceed with endovaginal exam following the transabdominal exam to visualize the uterus endometrium adnexa. COMPARISON:  None FINDINGS: Uterus Measurements: 9.6 cm length by 4.4 cm height by 4.6 cm wide = volume: 100.96 mL. Large hypoechoic mass within the cervix/lower uterine segment, this measures 5.8 x 4.6 x 5 cm and is vascular. Endometrium Thickness: 8.9 mm.  No focal abnormality visualized. Right ovary Measurements: 2.7 x 1.5 x 1.9 cm = volume: 4.1 mL. Normal appearance/no adnexal mass. Left ovary Measurements: 2.3 x 1.2 x 1.4 cm = volume: 2 mL. Normal appearance/no adnexal mass. Other findings Trace free fluid. IMPRESSION: 1. Large 5.8 cm hypoechoic vascular mass within the cervix/lower uterine segment. Gynecologic consultation is recommended. 2. Trace free fluid in the pelvis. Otherwise negative pelvic ultrasound. Electronically Signed   By: Donavan Foil M.D.   On: 03/15/2018 18:06   Nm Pet Image Initial (pi) Skull Base To Thigh  Result Date: 03/28/2018 CLINICAL DATA:  Initial treatment strategy for cervical  adenocarcinoma. EXAM: NUCLEAR MEDICINE PET SKULL BASE TO THIGH TECHNIQUE: 12.6 mCi F-18 FDG was injected intravenously. Full-ring PET imaging was performed from the skull base to thigh after the radiotracer. CT data was obtained and used for attenuation correction and anatomic localization. Fasting blood glucose: 95 mg/dl COMPARISON:  None. FINDINGS: (Background mediastinal blood pool activity: SUV max = 3.8) NECK: No hypermetabolic lymph nodes. Mild goiter is seen with diffuse hypermetabolic activity throughout the thyroid gland, consistent with thyroiditis. Incidental CT findings:  None. CHEST: No hypermetabolic masses or lymphadenopathy. No suspicious pulmonary nodules seen on CT images. Incidental CT findings:  None. ABDOMEN/PELVIS: No abnormal hypermetabolic activity within the liver, pancreas, adrenal glands, or spleen. No hypermetabolic lymph nodes in the abdomen. No evidence of hydroureteronephrosis. Hypermetabolic soft tissue mass in the cervix measures 6.3 x 6 2 cm, within SUV of 17.8. Bilateral hypermetabolic parametrial lymph nodes are seen, measuring 1.3 cm on the left with SUV max of 5.4, and measuring 1.9 cm on the right with SUV max of 11.3. An 8 mm right perirectal lymph node and sub-cm left internal iliac lymph node also show mild hypermetabolic activity. Mild hypermetabolic bilateral external iliac lymphadenopathy is seen, largest on the left measuring 11 mm with SUV max of 3.9, and on the right measuring 1.3 cm with SUV max of 6.6. Incidental CT findings:  None. SKELETON:  No hypermetabolic bone lesions identified. Incidental CT findings:  None. IMPRESSION: 6 cm hypermetabolic cervical mass, consistent with primary cervical carcinoma. Mild hypermetabolic bilateral parametrial, right perirectal, and bilateral iliac lymph nodes, consistent with metastatic disease. No evidence of metastatic disease within the abdomen, chest, or neck. Diffuse thyroiditis incidentally noted. Recommend correlation with  thyroid function tests. Electronically Signed   By: Earle Gell M.D.   On: 03/28/2018 14:33     Assessment and  plan- Patient is a 33 y.o. female with adenocarcinoma of the cervix FIGO stage III C1r T2N1M0 with metastases to the external iliac (pelvic lymph nodes)  Counts okay to proceed with cycle 1 of weekly cisplatin today.  She also has PRN nausea medications at home if she were to have any chemo-induced nausea vomiting.  Patient does have evidence of microcytic anemia with a hemoglobin of 9.2 which I suspect is secondary to iron deficiency from her ongoing vaginal bleeding.  I will check her ferritin iron studies as well as B12 and folate today.  If there is evidence of iron deficiency I will proceed with 2 doses of Feraheme.  Discussed risks and benefits of Feraheme including all but not limited to headache, leg swelling and risk of infusion reactions.  Patient understands and agrees to proceed as planned.  We will plan to give her Feraheme along with her second and third dose of chemotherapy.  Thrombocytosis likely reactive secondary to iron deficiency.  Continue to monitor  Anxiety: She is currently on Xanax 1 mg every 8 hours as needed which she will continue.  Abdominal pain: Likely secondary to underlying cervical cancer.  Patient has been taking ibuprofen almost every day and I have suggested her to cut back on the ibuprofen given the increased risk of gastritis especially with ongoing chemotherapy.  I will start her on Protonix 20 mg once a day.  We will give her 1 dose of Toradol for her abdominal pain today.  Have suggested that she should take Tylenol instead of ibuprofen for abdominal pain.  She is also on methadone 15 mg which is being prescribed by her PCP.  I am therefore not inclined to start any more narcotics at this time for abdominal pain   I will see her back in 1 week's time with CBC and CMP for cycle 2 of weekly cisplatin and for her first dose of Feraheme  Visit  Diagnosis 1. 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[place])   2. Iron deficiency anemia due to chronic blood loss   3. Encounter for antineoplastic chemotherapy   4. Thrombocytosis ([place])      Dr. Randa Evens, MD, MPH [facility] [phone] 04/11/2018 10:48 AM

## 2018-04-11 NOTE — Progress Notes (Signed)
No needs at this time. Appointment has been scheduled with Dr. Christel Mormon at [facility] to arrange vaginal brachy following external beam.

## 2018-04-12 ENCOUNTER — Ambulatory Visit
Admission: RE | Admit: 2018-04-12 | Discharge: 2018-04-12 | Disposition: A | Discharge status: Home / Self Care | Payer: Medicaid Other | Source: Ambulatory Visit | Attending: Radiation Oncology | Admitting: Radiation Oncology

## 2018-04-12 DIAGNOSIS — C539 Malignant neoplasm of cervix uteri, unspecified: Secondary | ICD-10-CM | POA: Diagnosis present

## 2018-04-12 DIAGNOSIS — E876 Hypokalemia: Secondary | ICD-10-CM | POA: Diagnosis not present

## 2018-04-12 DIAGNOSIS — Z510 Encounter for antineoplastic radiation therapy: Secondary | ICD-10-CM | POA: Diagnosis not present

## 2018-04-12 DIAGNOSIS — F319 Bipolar disorder, unspecified: Secondary | ICD-10-CM | POA: Diagnosis not present

## 2018-04-12 DIAGNOSIS — D72829 Elevated white blood cell count, unspecified: Secondary | ICD-10-CM | POA: Insufficient documentation

## 2018-04-12 DIAGNOSIS — Z79899 Other long term (current) drug therapy: Secondary | ICD-10-CM | POA: Diagnosis not present

## 2018-04-12 DIAGNOSIS — G893 Neoplasm related pain (acute) (chronic): Secondary | ICD-10-CM | POA: Insufficient documentation

## 2018-04-12 DIAGNOSIS — E860 Dehydration: Secondary | ICD-10-CM | POA: Diagnosis not present

## 2018-04-12 DIAGNOSIS — D473 Essential (hemorrhagic) thrombocythemia: Secondary | ICD-10-CM | POA: Diagnosis not present

## 2018-04-12 DIAGNOSIS — R112 Nausea with vomiting, unspecified: Secondary | ICD-10-CM | POA: Diagnosis not present

## 2018-04-12 DIAGNOSIS — N938 Other specified abnormal uterine and vaginal bleeding: Secondary | ICD-10-CM | POA: Insufficient documentation

## 2018-04-12 DIAGNOSIS — G4700 Insomnia, unspecified: Secondary | ICD-10-CM | POA: Diagnosis not present

## 2018-04-12 DIAGNOSIS — F17210 Nicotine dependence, cigarettes, uncomplicated: Secondary | ICD-10-CM | POA: Diagnosis not present

## 2018-04-13 ENCOUNTER — Other Ambulatory Visit: Payer: Self-pay

## 2018-04-13 ENCOUNTER — Telehealth: Payer: Self-pay | Admitting: *Deleted

## 2018-04-13 ENCOUNTER — Encounter: Payer: Self-pay | Admitting: Oncology

## 2018-04-13 ENCOUNTER — Ambulatory Visit
Admission: RE | Admit: 2018-04-13 | Discharge: 2018-04-13 | Disposition: A | Discharge status: Home / Self Care | Payer: Medicaid Other | Source: Ambulatory Visit | Attending: Radiation Oncology | Admitting: Radiation Oncology

## 2018-04-13 ENCOUNTER — Inpatient Hospital Stay: Payer: Medicaid Other

## 2018-04-13 ENCOUNTER — Inpatient Hospital Stay (HOSPITAL_BASED_OUTPATIENT_CLINIC_OR_DEPARTMENT_OTHER): Payer: Medicaid Other | Admitting: Oncology

## 2018-04-13 VITALS — BP 134/88 | HR 111 | Temp 99.5°F | Resp 20

## 2018-04-13 DIAGNOSIS — R102 Pelvic and perineal pain: Secondary | ICD-10-CM

## 2018-04-13 DIAGNOSIS — D473 Essential (hemorrhagic) thrombocythemia: Secondary | ICD-10-CM

## 2018-04-13 DIAGNOSIS — Z510 Encounter for antineoplastic radiation therapy: Secondary | ICD-10-CM | POA: Diagnosis not present

## 2018-04-13 DIAGNOSIS — E860 Dehydration: Secondary | ICD-10-CM

## 2018-04-13 DIAGNOSIS — G893 Neoplasm related pain (acute) (chronic): Secondary | ICD-10-CM

## 2018-04-13 DIAGNOSIS — G4700 Insomnia, unspecified: Secondary | ICD-10-CM

## 2018-04-13 DIAGNOSIS — C539 Malignant neoplasm of cervix uteri, unspecified: Secondary | ICD-10-CM

## 2018-04-13 DIAGNOSIS — D72829 Elevated white blood cell count, unspecified: Secondary | ICD-10-CM

## 2018-04-13 DIAGNOSIS — Z95828 Presence of other vascular implants and grafts: Secondary | ICD-10-CM

## 2018-04-13 DIAGNOSIS — N939 Abnormal uterine and vaginal bleeding, unspecified: Secondary | ICD-10-CM

## 2018-04-13 DIAGNOSIS — R109 Unspecified abdominal pain: Secondary | ICD-10-CM

## 2018-04-13 DIAGNOSIS — R630 Anorexia: Secondary | ICD-10-CM

## 2018-04-13 DIAGNOSIS — D649 Anemia, unspecified: Secondary | ICD-10-CM

## 2018-04-13 DIAGNOSIS — Z5111 Encounter for antineoplastic chemotherapy: Secondary | ICD-10-CM | POA: Diagnosis not present

## 2018-04-13 DIAGNOSIS — Z5189 Encounter for other specified aftercare: Secondary | ICD-10-CM

## 2018-04-13 LAB — CBC WITH DIFFERENTIAL/PLATELET
Abs Immature Granulocytes: 0.13 10*3/uL — ABNORMAL HIGH (ref 0.00–0.07)
Basophils Absolute: 0 10*3/uL (ref 0.0–0.1)
Basophils Relative: 0 %
Eosinophils Absolute: 0 10*3/uL (ref 0.0–0.5)
Eosinophils Relative: 0 %
HCT: 32.5 % — ABNORMAL LOW (ref 36.0–46.0)
HEMOGLOBIN: 9.9 g/dL — AB (ref 12.0–15.0)
IMMATURE GRANULOCYTES: 1 %
LYMPHS PCT: 13 %
Lymphs Abs: 2.7 10*3/uL (ref 0.7–4.0)
MCH: 24 pg — ABNORMAL LOW (ref 26.0–34.0)
MCHC: 30.5 g/dL (ref 30.0–36.0)
MCV: 78.7 fL — ABNORMAL LOW (ref 80.0–100.0)
Monocytes Absolute: 2.6 10*3/uL — ABNORMAL HIGH (ref 0.1–1.0)
Monocytes Relative: 12 %
Neutro Abs: 15.7 10*3/uL — ABNORMAL HIGH (ref 1.7–7.7)
Neutrophils Relative %: 74 %
Platelets: 833 10*3/uL — ABNORMAL HIGH (ref 150–400)
RBC: 4.13 MIL/uL (ref 3.87–5.11)
RDW: 15.3 % (ref 11.5–15.5)
WBC: 21.2 10*3/uL — ABNORMAL HIGH (ref 4.0–10.5)
nRBC: 0 % (ref 0.0–0.2)

## 2018-04-13 LAB — COMPREHENSIVE METABOLIC PANEL
ALK PHOS: 93 U/L (ref 38–126)
ALT: 12 U/L (ref 0–44)
AST: 16 U/L (ref 15–41)
Albumin: 3.2 g/dL — ABNORMAL LOW (ref 3.5–5.0)
Anion gap: 10 (ref 5–15)
BUN: 16 mg/dL (ref 6–20)
CO2: 26 mmol/L (ref 22–32)
CREATININE: 0.77 mg/dL (ref 0.44–1.00)
Calcium: 9.3 mg/dL (ref 8.9–10.3)
Chloride: 100 mmol/L (ref 98–111)
GFR calc Af Amer: >60 mL/min (ref >60)
Glucose, Bld: 103 mg/dL — ABNORMAL HIGH (ref 70–99)
Potassium: 3.7 mmol/L (ref 3.5–5.1)
Sodium: 136 mmol/L (ref 135–145)
TOTAL PROTEIN: 8.8 g/dL — AB (ref 6.5–8.1)
Total Bilirubin: 0.2 mg/dL — ABNORMAL LOW (ref 0.3–1.2)

## 2018-04-13 MED ORDER — DEXAMETHASONE SODIUM PHOSPHATE 10 MG/ML IJ SOLN: 10.0000 mg | Freq: Once | INTRAMUSCULAR | Status: DC

## 2018-04-13 MED ORDER — SODIUM CHLORIDE 0.9 % IV SOLN
Freq: Once | INTRAVENOUS | Status: AC
  Administered 2018-04-13: 14:00:00 via INTRAVENOUS
  Filled 2018-04-13: qty 250

## 2018-04-13 MED ORDER — SODIUM CHLORIDE 0.9% FLUSH
10.0000 mL | INTRAVENOUS | Status: DC | PRN
  Administered 2018-04-13: 10 mL via INTRAVENOUS
  Filled 2018-04-13: qty 10

## 2018-04-13 MED ORDER — DEXAMETHASONE SODIUM PHOSPHATE 10 MG/ML IJ SOLN
INTRAMUSCULAR | Status: AC
  Filled 2018-04-13: qty 1

## 2018-04-13 MED ORDER — HEPARIN SOD (PORK) LOCK FLUSH 100 UNIT/ML IV SOLN
500.0000 [IU] | Freq: Once | INTRAVENOUS | Status: AC
  Administered 2018-04-13: 500 [IU] via INTRAVENOUS
  Filled 2018-04-13: qty 5

## 2018-04-13 MED ORDER — KETOROLAC TROMETHAMINE 15 MG/ML IJ SOLN
30.0000 mg | Freq: Once | INTRAMUSCULAR | Status: AC
  Administered 2018-04-13: 30 mg via INTRAVENOUS
  Filled 2018-04-13: qty 2

## 2018-04-13 NOTE — Progress Notes (Signed)
Symptom Management Consult note [facility]  [phone] [phone]  Patient Care Team: [name], MD as PCP - General (Family Medicine) Clent Jacks, RN as Registered Nurse   Name of the patient: Kristy Gordon  [id number]  05-Dec-1985   Date of visit: 04/13/2018  Diagnosis: Cervical Cancer   Chief Complaint: Dehydration/weakness  Current Treatment: s/p cycle 1 cisplatin.  Last given on 04/11/2016.  Oncology History: Patient last seen by primary oncologist Dr. Janese Banks on 04/11/2017 prior to cycle 1 of cisplatin.  She reported having significant abdominal pain over the last 2 days and vaginal bleeding passing clots.  Counts were okay for treatment.  They proceeded.  Additional labs including ferritin, iron studies as well as B12 and folate levels were checked given microcytic anemia.  If evidence of iron deficiency will give 2 doses of Feraheme.  Continued to have thrombocytosis which is likely reactive to iron deficiency from vaginal bleeding.  Patient started on Protonix 20 mg a day and instructed to continue Tylenol instead of ibuprofen for abdominal pain.  She was seen in [facility] clinic by me on 04/08/2018 for evaluation of abdominal pain.  Stat labs were stable.  She was given 30 mg IV Toradol in clinic along with 1 L NaCl.  It was recommended she begin 3 times daily dosing of Tylenol and ibuprofen.  Pain clinic identified for management in future.  Narcotics were not prescribed.  Oncology History    -patient is a 33 year old female G66 who initially presented to the ER on 03/15/2018 with symptoms of abdominal pain and cramping. She has also been having irregular menstrual bleeding and spotting on and off for the last few months.She had an ultrasound pelvis done in the ER which showed a large 5.8 cm hypoechoic vascular mass within the cervix and the lower uterine segment. Patient had last seen GYN in 2013 and had not had a Pap smear done since  then. Patient was seen by GYN Dr. Nechama Guard on 1213 and was found to have firm friable cervix and multiple biopsies were taken which showed adenocarcinoma. She was then seen by Dr. Fransisca Connors from GYN oncology. Pelvic exam showed anteverted cervix that was replaced by tumor and extension of cancer into the left parametrium and possibly to the pelvic sidewall.   PET CT scan on 03/28/2018 showed 6 cm hypermetabolic cervical mass consistent with primary cervical carcinoma.  Mild hypermetabolic bilateral parametrial right perirectal bilateral iliac lymph nodes consistent with metastatic disease.  No evidence of metastatic disease within the abdomen chest or neck.  Plan is to proceed with concurrent chemoradiation with weekly cisplatin     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[place])   03/23/2018 Initial Diagnosis    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[place])    04/01/2018 Cancer Staging    Staging form: Cervix Uteri, AJCC 8th Edition - Clinical stage from 04/01/2018: FIGO Stage III (cT3, cN1, cM0) - Signed by Sindy Guadeloupe, MD on 04/01/2018    04/11/2018 -  Chemotherapy    The patient had palonosetron (ALOXI) injection 0.25 mg, 0.25 mg, Intravenous,  Once, 1 of 4 cycles Administration: 0.25 mg (04/11/2018) CISplatin (PLATINOL) 90 mg in sodium chloride 0.9 % 250 mL chemo infusion, 40 mg/m2 = 90 mg, Intravenous,  Once, 1 of 4 cycles Administration: 90 mg (04/11/2018) fosaprepitant (EMEND) 150 mg, dexamethasone (DECADRON) 12 mg in sodium chloride 0.9 % 145 mL IVPB, , Intravenous,  Once, 1 of 4 cycles Administration:  (04/11/2018)  for chemotherapy treatment.  Subjective Data:  ECOG: 1 - Symptomatic but completely ambulatory    Subjective:     Kristy Gordon is a 33 y.o. female who presents for evaluation of nausea, vomiting, and dehydration.  It has been present for 2 days.  Associated signs & symptoms:  moderate abdominal pain, ability to keep down some fluids, fever to 99.9 and nausea without vomiting The following  portions of the patient's history were reviewed and updated as appropriate: allergies, current medications, past family history, past medical history, past social history, past surgical history and problem list. Review of Systems A comprehensive review of systems was negative except for: Constitutional: positive for fatigue, fevers and malaise Cardiovascular: positive for fatigue Gastrointestinal: positive for abdominal pain, constipation and nausea Musculoskeletal: positive for muscle weakness and myalgias       Objective:    BP 134/88 (BP Location: Right Arm, Patient Position: Sitting) Comment: standing bp 135/87  Pulse (!) 111 Comment: standing pulse 118  Temp 99.5 F (37.5 C) (Tympanic)   Resp 20  General appearance: alert, fatigued and no distress Lungs: clear to auscultation bilaterally Abdomen: abnormal findings:  mild tenderness in the LLQ Pelvic: cervix normal in appearance, external genitalia normal, no adnexal masses or tenderness, no cervical motion tenderness, rectovaginal septum normal, uterus normal size, shape, and consistency and vagina normal without discharge Extremities: extremities normal, atraumatic, no cyanosis or edema Neurologic: Grossly normal    Assessment:     Dehydration d/t recent chemotherapy.  Plan:    IV fluids per orders. Labs per orders.   Stage IIIb cervical cancer: Status post cycle 1 cisplatin given on Monday, 04/11/2018.  Tolerated well except for nausea beginning day of treatment.  Taking antiemetics as prescribed with some relief.  Beginning daily XRT today.  Plan is for weekly cisplatin (every Monday) and daily radiation for 4-5 weeks.   Pelvic pain/abdominal pain: Likely to be due to malignancy.  Currently tolerable with Tylenol.  Has received tramadol 30 mg IV in clinic x2.  She continues methadone 15 mg daily.  Pain is stable.   Vaginal blood clots: Occurring daily. Sizeable. Continue to monitor.  Hemoglobin 9.9 today (representing an  increase from Monday).  Had iron panel completed on 04/11/2018 revealing low TIBC and saturation ratios.  Will need IV Feraheme in the near future.  Dr. Janese Banks is aware.  Insomnia: Continue 1 mg Xanax 3 times daily as prescribed by Dr. Janese Banks.  Dehydration: Likely due to recent chemotherapy.  Beginning radiation today.  Explained the importance of hydration.  Patient may need fluids weekly with her treatments.  Leukocytosis: d/t steroids.  Takes 8 mg dexamethasone x3 days following treatment.  No evidence of infection.  Low-grade temps are not new and likely malignancy related.  No other symptoms.  Plan: Stat labs.  Anemia (9.9).  Leukocytosis (21.2).  Thrombocytosis (833). Stat vital signs.  Mild tachycardia.  Resolved with fluids. Give 1 L NaCl today. Give 30 mg Toradol while in clinic. RTC on Friday prior to radiation for fluids.   Greater than 50% was spent in counseling and coordination of care with this patient including but not limited to discussion of the relevant topics above (See A&P) including, but not limited to diagnosis and management of acute and chronic medical conditions.   Faythe Casa, NP 04/13/2018 3:34 PM

## 2018-04-13 NOTE — Telephone Encounter (Signed)
Called reporting that she cannot make herself drink and she feels dehydrated and wants IV fluids, she has a radiation therapy  appointment at 3. Per Lorretta Harp, NP have her come in now and see NP Labs and IV fluids CBC CMP

## 2018-04-14 ENCOUNTER — Ambulatory Visit: Payer: Medicaid Other

## 2018-04-14 ENCOUNTER — Telehealth: Payer: Self-pay | Admitting: *Deleted

## 2018-04-14 ENCOUNTER — Ambulatory Visit
Admission: RE | Admit: 2018-04-14 | Discharge: 2018-04-14 | Disposition: A | Discharge status: Home / Self Care | Payer: Medicaid Other | Source: Ambulatory Visit | Attending: Radiation Oncology | Admitting: Radiation Oncology

## 2018-04-14 ENCOUNTER — Encounter: Payer: Self-pay | Admitting: Pharmacy Technician

## 2018-04-14 ENCOUNTER — Other Ambulatory Visit: Payer: Self-pay | Admitting: *Deleted

## 2018-04-14 DIAGNOSIS — Z510 Encounter for antineoplastic radiation therapy: Secondary | ICD-10-CM | POA: Diagnosis not present

## 2018-04-14 MED ORDER — PANTOPRAZOLE SODIUM 20 MG PO TBEC: 20.0000 mg | DELAYED_RELEASE_TABLET | Freq: Every day | ORAL | 3 refills | Status: DC

## 2018-04-14 NOTE — Telephone Encounter (Signed)
Crystal came over and talk to me about pantoprazole for Ms. Ehrich.  She got a call from Ms. Looman stating that the medicine will cost $80 and that she had spoke with Barnabas Lister and Barnabas Lister said [facility].  I called Barnabas Lister and asked him about this and he said they he is willing to pay for the medication through [facility].  I put a refill in the pantoprazole and on the byrd phone and if any questions the pharmacy could call me [phone].  Crystal let the patient know that [facility] and [facility] the prescription there

## 2018-04-14 NOTE — Progress Notes (Signed)
Patient has been approved for drug assistance by [employer] for [employer]. The enrollment period is from 04/12/18-04/12/19 based on selfpay. First DOS covered is 04/18/18.

## 2018-04-15 ENCOUNTER — Ambulatory Visit
Admission: RE | Admit: 2018-04-15 | Discharge: 2018-04-15 | Disposition: A | Discharge status: Home / Self Care | Payer: Medicaid Other | Source: Ambulatory Visit | Attending: Radiation Oncology | Admitting: Radiation Oncology

## 2018-04-15 ENCOUNTER — Telehealth: Payer: Self-pay

## 2018-04-15 ENCOUNTER — Inpatient Hospital Stay (HOSPITAL_BASED_OUTPATIENT_CLINIC_OR_DEPARTMENT_OTHER): Payer: Medicaid Other | Admitting: Oncology

## 2018-04-15 ENCOUNTER — Inpatient Hospital Stay: Payer: Medicaid Other

## 2018-04-15 ENCOUNTER — Ambulatory Visit: Payer: Medicaid Other

## 2018-04-15 VITALS — BP 145/84 | HR 58 | Temp 97.5°F | Resp 18

## 2018-04-15 DIAGNOSIS — R112 Nausea with vomiting, unspecified: Secondary | ICD-10-CM

## 2018-04-15 DIAGNOSIS — Z510 Encounter for antineoplastic radiation therapy: Secondary | ICD-10-CM | POA: Diagnosis not present

## 2018-04-15 DIAGNOSIS — Z5111 Encounter for antineoplastic chemotherapy: Secondary | ICD-10-CM | POA: Diagnosis not present

## 2018-04-15 DIAGNOSIS — E860 Dehydration: Secondary | ICD-10-CM

## 2018-04-15 DIAGNOSIS — M545 Low back pain, unspecified: Secondary | ICD-10-CM

## 2018-04-15 DIAGNOSIS — C539 Malignant neoplasm of cervix uteri, unspecified: Secondary | ICD-10-CM

## 2018-04-15 DIAGNOSIS — T451X5A Adverse effect of antineoplastic and immunosuppressive drugs, initial encounter: Principal | ICD-10-CM

## 2018-04-15 MED ORDER — KETOROLAC TROMETHAMINE 15 MG/ML IJ SOLN
30.0000 mg | Freq: Once | INTRAMUSCULAR | Status: AC
  Administered 2018-04-15: 30 mg via INTRAVENOUS
  Filled 2018-04-15: qty 2

## 2018-04-15 MED ORDER — SODIUM CHLORIDE 0.9 % IV SOLN
INTRAVENOUS | Status: DC
  Administered 2018-04-15: 08:00:00 via INTRAVENOUS
  Filled 2018-04-15 (×2): qty 250

## 2018-04-15 MED ORDER — LORAZEPAM 2 MG/ML IJ SOLN
0.5000 mg | Freq: Once | INTRAMUSCULAR | Status: AC
  Administered 2018-04-15: 0.5 mg via INTRAVENOUS
  Filled 2018-04-15: qty 1

## 2018-04-15 MED ORDER — PROCHLORPERAZINE EDISYLATE 10 MG/2ML IJ SOLN
10.0000 mg | Freq: Once | INTRAMUSCULAR | Status: AC
  Administered 2018-04-15: 10 mg via INTRAVENOUS
  Filled 2018-04-15: qty 2

## 2018-04-15 MED ORDER — ONDANSETRON 4 MG PO TBDP: 4.0000 mg | ORAL_TABLET | Freq: Four times a day (QID) | ORAL | 0 refills | Status: DC | PRN

## 2018-04-15 MED ORDER — SODIUM CHLORIDE 0.9% FLUSH
10.0000 mL | INTRAVENOUS | Status: AC | PRN
  Administered 2018-04-15: 10 mL via INTRAVENOUS
  Filled 2018-04-15: qty 10

## 2018-04-15 MED ORDER — HEPARIN SOD (PORK) LOCK FLUSH 100 UNIT/ML IV SOLN
500.0000 [IU] | Freq: Once | INTRAVENOUS | Status: AC
  Administered 2018-04-15: 500 [IU] via INTRAVENOUS

## 2018-04-15 NOTE — Progress Notes (Signed)
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 Positive pelvic LN   Chief complaint/ Reason for visit: Sick visit for nausea vomiting  Heme/Onc history: patient is a 33 year old female G4 who initially presented to the ER on 03/15/2018 with symptoms of abdominal pain and cramping. She has also been having irregular menstrual bleeding and spotting on and off for the last few months.She had an ultrasound pelvis done in the ER which showed a large 5.8 cm hypoechoic vascular mass within the cervix and the lower uterine segment. Patient had last seen GYN in 2013 and had not had a Pap smear done since then. Patient was seen by GYN Dr. Nechama Guard on 1213 and was found to have firm friable cervix and multiple biopsies were taken which showed adenocarcinoma. She was then seen by Dr. Fransisca Connors from GYN oncology. Pelvic exam showed anteverted cervix that was replaced by tumor and extension of cancer into the left parametrium and possibly to the pelvic sidewall.  PET CT scan on 03/28/2018 showed 6 cm hypermetabolic cervical mass consistent with primary cervical carcinoma. Mild hypermetabolic bilateral parametrial right perirectal bilateral iliac lymph nodes consistent with metastatic disease. No evidence of metastatic disease within the abdomen chest or neck.  Cycle 1 of weekly cisplatin and radiation started on 04/11/2018  Interval history-patient presents with acute onset nausea and vomiting that started at 3 AM this morning.  She received her chemo on 04/11/2018 and she was doing well for the last few days without any significant nausea or vomiting.  This  morning she woke up with continuous retching and vomiting and is unable to keep any liquids or pills down.  She is also complaining of ongoing back pain  ECOG PS- 0 Pain scale- 0 Opioid associated constipation- no  Review of systems- Review of Systems  Constitutional: Negative for chills, fever, malaise/fatigue and weight loss.  HENT: Negative for congestion, ear discharge and nosebleeds.   Eyes: Negative for blurred vision.  Respiratory: Negative for cough, hemoptysis, sputum production, shortness of breath and wheezing.   Cardiovascular: Negative for chest pain, palpitations, orthopnea and claudication.  Gastrointestinal: Positive for nausea and vomiting. Negative for abdominal pain, blood in stool, constipation, diarrhea, heartburn and melena.  Genitourinary: Negative for dysuria, flank pain, frequency, hematuria and urgency.  Musculoskeletal: Positive for back pain. Negative for joint pain and myalgias.  Skin: Negative for rash.  Neurological: Negative for dizziness, tingling, focal weakness, seizures, weakness and headaches.  Endo/Heme/Allergies: Does not bruise/bleed easily.  Psychiatric/Behavioral: Negative for depression and suicidal ideas. The patient does not have insomnia.       Allergies  Allergen Reactions  . Amoxicillin Rash  . Penicillins Hives     Past Medical History:  Diagnosis Date  . Anxiety   . Cancer (HCC)    cervical cancer  . Hashimoto's thyroiditis   . Hypothyroid      Past Surgical History:  Procedure Laterality Date  . BUNIONECTOMY Right   . PORTA CATH INSERTION N/A 03/31/2018   Procedure: PORTA CATH INSERTION;  Surgeon: Algernon Huxley, MD;  Location: [facility];  Service: Cardiovascular;  Laterality: N/A;  . TONSILLECTOMY  Social History   Socioeconomic History  . Marital status: Married    Spouse name: Not on file  . Number of children: Not on file  . Years of education: Not on file  . Highest education level: Not on file    Occupational History  . Not on file  Social Needs  . Financial resource strain: Not on file  . Food insecurity:    Worry: Not on file    Inability: Not on file  . Transportation needs:    Medical: Not on file    Non-medical: Not on file  Tobacco Use  . Smoking status: Current Every Day Smoker    Packs/day: 1.00    Years: 10.00    Pack years: 10.00    Types: Cigarettes  . Smokeless tobacco: Never Used  Substance and Sexual Activity  . Alcohol use: Yes    Comment: rare  . Drug use: No    Comment: quit opiates 5 years ago  . Sexual activity: Yes    Birth control/protection: None  Lifestyle  . Physical activity:    Days per week: Not on file    Minutes per session: Not on file  . Stress: Not on file  Relationships  . Social connections:    Talks on phone: Not on file    Gets together: Not on file    Attends religious service: Not on file    Active member of club or organization: Not on file    Attends meetings of clubs or organizations: Not on file    Relationship status: Not on file  . Intimate partner violence:    Fear of current or ex partner: Not on file    Emotionally abused: Not on file    Physically abused: Not on file    Forced sexual activity: Not on file  Other Topics Concern  . Not on file  Social History Narrative  . Not on file    Family History  Adopted: Yes  Family history unknown: Yes     Current Outpatient Medications:  .  acetaminophen (TYLENOL) 500 MG tablet, Take 500 mg by mouth every 4 (four) hours as needed., Disp: , Rfl:  .  ALPRAZolam (XANAX) 1 MG tablet, Take 1 tablet (1 mg total) by mouth 3 (three) times daily as needed for anxiety., Disp: 90 tablet, Rfl: 0 .  citalopram (CELEXA) 40 MG tablet, Take 1 tablet (40 mg total) by mouth daily., Disp: , Rfl:  .  dexamethasone (DECADRON) 4 MG tablet, Take 2 tablets by mouth once a day on the day after chemotherapy and then take 2 tablets two times a day for 2 days. Take with food. (Patient not  taking: Reported on 04/11/2018), Disp: 30 tablet, Rfl: 1 .  levothyroxine (SYNTHROID, LEVOTHROID) 112 MCG tablet, Take 2 tablets (224 mcg total) by mouth daily., Disp: 60 tablet, Rfl: 5 .  lidocaine-prilocaine (EMLA) cream, Apply to affected area once, Disp: 30 g, Rfl: 3 .  methadone (DOLOPHINE) 5 MG tablet, Take 15 mg by mouth daily. Once daily, Disp: , Rfl:  .  ondansetron (ZOFRAN) 8 MG tablet, Take 1 tablet (8 mg total) by mouth 2 (two) times daily as needed. Start on the third day after chemotherapy. (Patient not taking: Reported on 04/11/2018), Disp: 30 tablet, Rfl: 1 .  ondansetron (ZOFRAN-ODT) 4 MG disintegrating tablet, Take 1 tablet (4 mg total) by mouth every 6 (six) hours as needed for nausea or vomiting., Disp: 30 tablet, Rfl: 0 .  pantoprazole (PROTONIX)  20 MG tablet, Take 1 tablet (20 mg total) by mouth daily., Disp: 30 tablet, Rfl: 3 .  prochlorperazine (COMPAZINE) 10 MG tablet, Take 1 tablet (10 mg total) by mouth every 6 (six) hours as needed (Nausea or vomiting). (Patient not taking: Reported on 04/11/2018), Disp: 30 tablet, Rfl: 1 No current facility-administered medications for this visit.   Facility-Administered Medications Ordered in Other Visits:  .  0.9 %  sodium chloride infusion, , Intravenous, Continuous, Sindy Guadeloupe, MD, Stopped at 04/15/18 1001 .  sodium chloride flush (NS) 0.9 % injection 10 mL, 10 mL, Intravenous, PRN, Sindy Guadeloupe, MD, 10 mL at 04/15/18 0825  Physical exam:  Vitals:   04/15/18 1431  BP: (!) 145/84  Pulse: (!) 58  Resp: 18  Temp: (!) 97.5 F (36.4 C)  TempSrc: Tympanic   Physical Exam Constitutional:      Comments: Appears in distress from nausea and vomiting  HENT:     Head: Normocephalic and atraumatic.  Eyes:     Pupils: Pupils are equal, round, and reactive to light.  Neck:     Musculoskeletal: Normal range of motion.  Cardiovascular:     Rate and Rhythm: Normal rate and regular rhythm.     Heart sounds: Normal heart sounds.    Pulmonary:     Effort: Pulmonary effort is normal.     Breath sounds: Normal breath sounds.  Abdominal:     General: Bowel sounds are normal.     Palpations: Abdomen is soft.  Skin:    General: Skin is warm and dry.  Neurological:     Mental Status: She is alert and oriented to person, place, and time.      CMP Latest Ref Rng & Units 04/13/2018  Glucose 70 - 99 mg/dL 103(H)  BUN 6 - 20 mg/dL 16  Creatinine 0.44 - 1.00 mg/dL 0.77  Sodium 135 - 145 mmol/L 136  Potassium 3.5 - 5.1 mmol/L 3.7  Chloride 98 - 111 mmol/L 100  CO2 22 - 32 mmol/L 26  Calcium 8.9 - 10.3 mg/dL 9.3  Total Protein 6.5 - 8.1 g/dL 8.8(H)  Total Bilirubin 0.3 - 1.2 mg/dL 0.2(L)  Alkaline Phos 38 - 126 U/L 93  AST 15 - 41 U/L 16  ALT 0 - 44 U/L 12   CBC Latest Ref Rng & Units 04/13/2018  WBC 4.0 - 10.5 K/uL 21.2(H)  Hemoglobin 12.0 - 15.0 g/dL 9.9(L)  Hematocrit 36.0 - 46.0 % 32.5(L)  Platelets 150 - 400 K/uL 833(H)    No images are attached to the encounter.  Nm Pet Image Initial (pi) Skull Base To Thigh  Result Date: 03/28/2018 CLINICAL DATA:  Initial treatment strategy for cervical adenocarcinoma. EXAM: NUCLEAR MEDICINE PET SKULL BASE TO THIGH TECHNIQUE: 12.6 mCi F-18 FDG was injected intravenously. Full-ring PET imaging was performed from the skull base to thigh after the radiotracer. CT data was obtained and used for attenuation correction and anatomic localization. Fasting blood glucose: 95 mg/dl COMPARISON:  None. FINDINGS: (Background mediastinal blood pool activity: SUV max = 3.8) NECK: No hypermetabolic lymph nodes. Mild goiter is seen with diffuse hypermetabolic activity throughout the thyroid gland, consistent with thyroiditis. Incidental CT findings:  None. CHEST: No hypermetabolic masses or lymphadenopathy. No suspicious pulmonary nodules seen on CT images. Incidental CT findings:  None. ABDOMEN/PELVIS: No abnormal hypermetabolic activity within the liver, pancreas, adrenal glands, or spleen. No  hypermetabolic lymph nodes in the abdomen. No evidence of hydroureteronephrosis. Hypermetabolic soft tissue mass in the cervix  measures 6.3 x 6 2 cm, within SUV of 17.8. Bilateral hypermetabolic parametrial lymph nodes are seen, measuring 1.3 cm on the left with SUV max of 5.4, and measuring 1.9 cm on the right with SUV max of 11.3. An 8 mm right perirectal lymph node and sub-cm left internal iliac lymph node also show mild hypermetabolic activity. Mild hypermetabolic bilateral external iliac lymphadenopathy is seen, largest on the left measuring 11 mm with SUV max of 3.9, and on the right measuring 1.3 cm with SUV max of 6.6. Incidental CT findings:  None. SKELETON:  No hypermetabolic bone lesions identified. Incidental CT findings:  None. IMPRESSION: 6 cm hypermetabolic cervical mass, consistent with primary cervical carcinoma. Mild hypermetabolic bilateral parametrial, right perirectal, and bilateral iliac lymph nodes, consistent with metastatic disease. No evidence of metastatic disease within the abdomen, chest, or neck. Diffuse thyroiditis incidentally noted. Recommend correlation with thyroid function tests. Electronically Signed   By: Earle Gell M.D.   On: 03/28/2018 14:33     Assessment and plan- Patient is a 33 y.o. female with ongoing concurrent chemoradiation for stage III cervical cancer presenting with acute onset of nausea and vomiting  Delayed nausea and vomiting likely secondary to cisplatin.  We will give her 1 L of IV fluids today along with 10 mg of IV Compazine and 0.5 mg of IV Ativan.  We also gave her 30 mg of Toradol for her back pain.  Patient felt better after receiving fluids and nausea medications and was able to continue with her radiation today.  I will see her on 04/18/2018 as scheduled for cycle 2 of weekly cisplatin and we may give her possible fluids on that day as well   Visit Diagnosis 1. Chemotherapy induced nausea and vomiting      Dr. Randa Evens, MD, MPH [facility]  [facility] [phone] 04/15/2018 2:25 PM

## 2018-04-15 NOTE — Telephone Encounter (Signed)
The answering service personnel called stating that the patient had called wanting to know if she could come in for treatment at 8:30 AM instead of 9:30 AM since she had been vomiting. I was given patient's number to call.  Once I opened patient's chart to document, I saw that she was already at the cancer center being treated.

## 2018-04-18 ENCOUNTER — Inpatient Hospital Stay: Payer: Medicaid Other

## 2018-04-18 ENCOUNTER — Encounter: Payer: Self-pay | Admitting: Oncology

## 2018-04-18 ENCOUNTER — Inpatient Hospital Stay (HOSPITAL_BASED_OUTPATIENT_CLINIC_OR_DEPARTMENT_OTHER): Payer: Medicaid Other | Admitting: Oncology

## 2018-04-18 ENCOUNTER — Ambulatory Visit: Payer: Medicaid Other

## 2018-04-18 ENCOUNTER — Ambulatory Visit
Admission: RE | Admit: 2018-04-18 | Discharge: 2018-04-18 | Disposition: A | Discharge status: Home / Self Care | Payer: Medicaid Other | Source: Ambulatory Visit | Attending: Radiation Oncology | Admitting: Radiation Oncology

## 2018-04-18 VITALS — BP 131/85 | HR 108 | Temp 97.4°F | Resp 18 | Wt 229.2 lb

## 2018-04-18 DIAGNOSIS — C539 Malignant neoplasm of cervix uteri, unspecified: Secondary | ICD-10-CM

## 2018-04-18 DIAGNOSIS — Z5111 Encounter for antineoplastic chemotherapy: Secondary | ICD-10-CM | POA: Diagnosis not present

## 2018-04-18 DIAGNOSIS — D509 Iron deficiency anemia, unspecified: Secondary | ICD-10-CM

## 2018-04-18 DIAGNOSIS — F419 Anxiety disorder, unspecified: Secondary | ICD-10-CM

## 2018-04-18 DIAGNOSIS — C778 Secondary and unspecified malignant neoplasm of lymph nodes of multiple regions: Secondary | ICD-10-CM

## 2018-04-18 DIAGNOSIS — T451X5A Adverse effect of antineoplastic and immunosuppressive drugs, initial encounter: Secondary | ICD-10-CM

## 2018-04-18 DIAGNOSIS — D473 Essential (hemorrhagic) thrombocythemia: Secondary | ICD-10-CM

## 2018-04-18 DIAGNOSIS — Z720 Tobacco use: Secondary | ICD-10-CM

## 2018-04-18 DIAGNOSIS — D500 Iron deficiency anemia secondary to blood loss (chronic): Secondary | ICD-10-CM

## 2018-04-18 DIAGNOSIS — E876 Hypokalemia: Secondary | ICD-10-CM

## 2018-04-18 DIAGNOSIS — Z510 Encounter for antineoplastic radiation therapy: Secondary | ICD-10-CM | POA: Diagnosis not present

## 2018-04-18 DIAGNOSIS — D75838 Other thrombocytosis: Secondary | ICD-10-CM

## 2018-04-18 DIAGNOSIS — R7989 Other specified abnormal findings of blood chemistry: Secondary | ICD-10-CM

## 2018-04-18 DIAGNOSIS — R112 Nausea with vomiting, unspecified: Secondary | ICD-10-CM

## 2018-04-18 LAB — CBC WITH DIFFERENTIAL/PLATELET
Abs Immature Granulocytes: 0.1 10*3/uL — ABNORMAL HIGH (ref 0.00–0.07)
Basophils Absolute: 0 10*3/uL (ref 0.0–0.1)
Basophils Relative: 0 %
Eosinophils Absolute: 0.2 10*3/uL (ref 0.0–0.5)
Eosinophils Relative: 1 %
HCT: 31.4 % — ABNORMAL LOW (ref 36.0–46.0)
Hemoglobin: 9.7 g/dL — ABNORMAL LOW (ref 12.0–15.0)
Immature Granulocytes: 1 %
Lymphocytes Relative: 21 %
Lymphs Abs: 2.6 10*3/uL (ref 0.7–4.0)
MCH: 24 pg — ABNORMAL LOW (ref 26.0–34.0)
MCHC: 30.9 g/dL (ref 30.0–36.0)
MCV: 77.7 fL — ABNORMAL LOW (ref 80.0–100.0)
Monocytes Absolute: 0.9 10*3/uL (ref 0.1–1.0)
Monocytes Relative: 7 %
Neutro Abs: 8.6 10*3/uL — ABNORMAL HIGH (ref 1.7–7.7)
Neutrophils Relative %: 70 %
Platelets: 738 10*3/uL — ABNORMAL HIGH (ref 150–400)
RBC: 4.04 MIL/uL (ref 3.87–5.11)
RDW: 15 % (ref 11.5–15.5)
WBC: 12.3 10*3/uL — ABNORMAL HIGH (ref 4.0–10.5)
nRBC: 0 % (ref 0.0–0.2)

## 2018-04-18 LAB — COMPREHENSIVE METABOLIC PANEL
ALT: 38 U/L (ref 0–44)
AST: 21 U/L (ref 15–41)
Albumin: 3.1 g/dL — ABNORMAL LOW (ref 3.5–5.0)
Alkaline Phosphatase: 112 U/L (ref 38–126)
Anion gap: 12 (ref 5–15)
BUN: 15 mg/dL (ref 6–20)
CO2: 27 mmol/L (ref 22–32)
Calcium: 9 mg/dL (ref 8.9–10.3)
Chloride: 99 mmol/L (ref 98–111)
Creatinine, Ser: 0.7 mg/dL (ref 0.44–1.00)
GFR calc Af Amer: >60 mL/min (ref >60)
GFR calc non Af Amer: >60 mL/min (ref >60)
Glucose, Bld: 121 mg/dL — ABNORMAL HIGH (ref 70–99)
POTASSIUM: 3.3 mmol/L — AB (ref 3.5–5.1)
Sodium: 138 mmol/L (ref 135–145)
Total Bilirubin: 0.6 mg/dL (ref 0.3–1.2)
Total Protein: 8.2 g/dL — ABNORMAL HIGH (ref 6.5–8.1)

## 2018-04-18 MED ORDER — SODIUM CHLORIDE 0.9% FLUSH
10.0000 mL | Freq: Once | INTRAVENOUS | Status: AC
  Administered 2018-04-18: 10 mL via INTRAVENOUS
  Filled 2018-04-18: qty 10

## 2018-04-18 MED ORDER — HEPARIN SOD (PORK) LOCK FLUSH 100 UNIT/ML IV SOLN
500.0000 [IU] | Freq: Once | INTRAVENOUS | Status: AC
  Administered 2018-04-18: 500 [IU] via INTRAVENOUS
  Filled 2018-04-18: qty 5

## 2018-04-18 NOTE — Progress Notes (Signed)
Patient having labs and seeing MD today. Chemotherapy planned for tomm. Patient states pain is well controlled at the moment. Disintergrating zofran is wonderful. Has constipation. Not taking anything d/t radiation telling her she might have diarrhea. She will speak with Dr Janese Banks concerning this. Has occ random vaginal bleeding, some clots.

## 2018-04-18 NOTE — Progress Notes (Signed)
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 Positive pelvic LN  Chief complaint/ Reason for visit-on treatment assessment prior to cycle 2 of weekly cisplatin chemotherapy  Heme/Onc history: patient is a 33 year old female G35 who initially presented to the ER on 03/15/2018 with symptoms of abdominal pain and cramping. She has also been having irregular menstrual bleeding and spotting on and off for the last few months.She had an ultrasound pelvis done in the ER which showed a large 5.8 cm hypoechoic vascular mass within the cervix and the lower uterine segment. Patient had last seen GYN in 2013 and had not had a Pap smear done since then. Patient was seen by GYN Dr. Nechama Guard on 1213 and was found to have firm friable cervix and multiple biopsies were taken which showed adenocarcinoma. She was then seen by Dr. Fransisca Connors from GYN oncology. Pelvic exam showed anteverted cervix that was replaced by tumor and extension of cancer into the left parametrium and possibly to the pelvic sidewall.  PET CT scan on 03/28/2018 showed 6 cm hypermetabolic cervical mass consistent with primary cervical carcinoma. Mild hypermetabolic bilateral parametrial right perirectal bilateral iliac lymph nodes consistent with metastatic disease. No evidence of metastatic disease within the abdomen chest or neck.  Cycle 1 of weekly cisplatin and radiation started on 04/11/2018  Interval history-she feels much better today and she had a good weekend without any significant nausea and vomiting.  She has been using Zofran ODT 4 mg about every 4-1/2 hours and says it has been  helping her a lot to cope up with her nausea.  Her anxiety is also getting better as she is getting used to her daily treatments.  She reports feeling a little constipated and her last small bowel movement was about 2 days ago.  ECOG PS- 0 Pain scale- 1 Opioid associated constipation- yes  Review of systems- Review of Systems  Constitutional: Positive for malaise/fatigue. Negative for chills, fever and weight loss.  HENT: Negative for congestion, ear discharge and nosebleeds.   Eyes: Negative for blurred vision.  Respiratory: Negative for cough, hemoptysis, sputum production, shortness of breath and wheezing.   Cardiovascular: Negative for chest pain, palpitations, orthopnea and claudication.  Gastrointestinal: Positive for constipation. Negative for abdominal pain, blood in stool, diarrhea, heartburn, melena, nausea and vomiting.  Genitourinary: Negative for dysuria, flank pain, frequency, hematuria and urgency.  Musculoskeletal: Positive for back pain. Negative for joint pain and myalgias.  Skin: Negative for rash.  Neurological: Negative for dizziness, tingling, focal weakness, seizures, weakness and headaches.  Endo/Heme/Allergies: Does not bruise/bleed easily.  Psychiatric/Behavioral: Negative for depression and suicidal ideas. The patient does not have insomnia.        Allergies  Allergen Reactions  . Amoxicillin Rash  . Penicillins Hives     Past Medical History:  Diagnosis Date  . Anxiety   . Cancer (HCC)    cervical cancer  . Hashimoto's thyroiditis   . Hypothyroid      Past Surgical History:  Procedure Laterality Date  . BUNIONECTOMY Right   . PORTA CATH INSERTION N/A 03/31/2018   Procedure: PORTA CATH INSERTION;  Surgeon: Algernon Huxley, MD;  Location: [facility];  Service: Cardiovascular;  Laterality: N/A;  . TONSILLECTOMY      Social History   Socioeconomic History  . Marital status: Married    Spouse name: Not on file  . Number of children:  Not on file  . Years of education: Not on file  . Highest education level: Not on file  Occupational History  . Not on file  Social Needs  . Financial resource strain: Not on file  . Food insecurity:    Worry: Not on file    Inability: Not on file  . Transportation needs:    Medical: Not on file    Non-medical: Not on file  Tobacco Use  . Smoking status: Current Every Day Smoker    Packs/day: 1.00    Years: 10.00    Pack years: 10.00    Types: Cigarettes  . Smokeless tobacco: Never Used  Substance and Sexual Activity  . Alcohol use: Yes    Comment: rare  . Drug use: No    Comment: quit opiates 5 years ago  . Sexual activity: Yes    Birth control/protection: None  Lifestyle  . Physical activity:    Days per week: Not on file    Minutes per session: Not on file  . Stress: Not on file  Relationships  . Social connections:    Talks on phone: Not on file    Gets together: Not on file    Attends religious service: Not on file    Active member of club or organization: Not on file    Attends meetings of clubs or organizations: Not on file    Relationship status: Not on file  . Intimate partner violence:    Fear of current or ex partner: Not on file    Emotionally abused: Not on file    Physically abused: Not on file    Forced sexual activity: Not on file  Other Topics Concern  . Not on file  Social History Narrative  . Not on file    Family History  Adopted: Yes  Family history unknown: Yes     Current Outpatient Medications:  .  acetaminophen (TYLENOL) 500 MG tablet, Take 500 mg by mouth every 4 (four) hours as needed., Disp: , Rfl:  .  ALPRAZolam (XANAX) 1 MG tablet, Take 1 tablet (1 mg total) by mouth 3 (three) times daily as needed for anxiety., Disp: 90 tablet, Rfl: 0 .  citalopram (CELEXA) 40 MG tablet, Take 1 tablet (40 mg total) by mouth daily., Disp: , Rfl:  .  dexamethasone (DECADRON) 4 MG tablet, Take 2 tablets by mouth once a day on the day after  chemotherapy and then take 2 tablets two times a day for 2 days. Take with food. (Patient not taking: Reported on 04/11/2018), Disp: 30 tablet, Rfl: 1 .  levothyroxine (SYNTHROID, LEVOTHROID) 112 MCG tablet, Take 2 tablets (224 mcg total) by mouth daily., Disp: 60 tablet, Rfl: 5 .  lidocaine-prilocaine (EMLA) cream, Apply to affected area once, Disp: 30 g, Rfl: 3 .  methadone (DOLOPHINE) 5 MG tablet, Take 15 mg by mouth daily. Once daily, Disp: , Rfl:  .  ondansetron (ZOFRAN) 8 MG tablet, Take 1 tablet (8 mg total) by mouth 2 (two) times daily as needed. Start on the third day after chemotherapy. (Patient not taking: Reported on 04/11/2018), Disp: 30 tablet, Rfl: 1 .  ondansetron (ZOFRAN-ODT) 4 MG disintegrating tablet, Take 1 tablet (4 mg total) by mouth every 6 (six) hours  as needed for nausea or vomiting., Disp: 30 tablet, Rfl: 0 .  pantoprazole (PROTONIX) 20 MG tablet, Take 1 tablet (20 mg total) by mouth daily., Disp: 30 tablet, Rfl: 3 .  prochlorperazine (COMPAZINE) 10 MG tablet, Take 1 tablet (10 mg total) by mouth every 6 (six) hours as needed (Nausea or vomiting). (Patient not taking: Reported on 04/11/2018), Disp: 30 tablet, Rfl: 1 No current facility-administered medications for this visit.   Facility-Administered Medications Ordered in Other Visits:  .  0.9 %  sodium chloride infusion, , Intravenous, Continuous, Sindy Guadeloupe, MD, Stopped at 04/15/18 1001 .  sodium chloride flush (NS) 0.9 % injection 10 mL, 10 mL, Intravenous, PRN, Sindy Guadeloupe, MD, 10 mL at 04/15/18 0825  Physical exam:  Vitals:   04/18/18 0902  BP: 131/85  Pulse: (!) 108  Resp: 18  Temp: (!) 97.4 F (36.3 C)  TempSrc: Tympanic  Weight: 229 lb 3.2 oz (104 kg)   Physical Exam HENT:     Head: Normocephalic and atraumatic.  Eyes:     Pupils: Pupils are equal, round, and reactive to light.  Neck:     Musculoskeletal: Normal range of motion.  Cardiovascular:     Rate and Rhythm: Regular rhythm. Tachycardia  present.     Heart sounds: Normal heart sounds.  Pulmonary:     Effort: Pulmonary effort is normal.     Breath sounds: Normal breath sounds.  Abdominal:     General: Bowel sounds are normal.     Palpations: Abdomen is soft.  Skin:    General: Skin is warm and dry.  Neurological:     Mental Status: She is alert and oriented to person, place, and time.      CMP Latest Ref Rng & Units 04/18/2018  Glucose 70 - 99 mg/dL 121(H)  BUN 6 - 20 mg/dL 15  Creatinine 0.44 - 1.00 mg/dL 0.70  Sodium 135 - 145 mmol/L 138  Potassium 3.5 - 5.1 mmol/L 3.3(L)  Chloride 98 - 111 mmol/L 99  CO2 22 - 32 mmol/L 27  Calcium 8.9 - 10.3 mg/dL 9.0  Total Protein 6.5 - 8.1 g/dL 8.2(H)  Total Bilirubin 0.3 - 1.2 mg/dL 0.6  Alkaline Phos 38 - 126 U/L 112  AST 15 - 41 U/L 21  ALT 0 - 44 U/L 38   CBC Latest Ref Rng & Units 04/18/2018  WBC 4.0 - 10.5 K/uL 12.3(H)  Hemoglobin 12.0 - 15.0 g/dL 9.7(L)  Hematocrit 36.0 - 46.0 % 31.4(L)  Platelets 150 - 400 K/uL 738(H)    No images are attached to the encounter.  Nm Pet Image Initial (pi) Skull Base To Thigh  Result Date: 03/28/2018 CLINICAL DATA:  Initial treatment strategy for cervical adenocarcinoma. EXAM: NUCLEAR MEDICINE PET SKULL BASE TO THIGH TECHNIQUE: 12.6 mCi F-18 FDG was injected intravenously. Full-ring PET imaging was performed from the skull base to thigh after the radiotracer. CT data was obtained and used for attenuation correction and anatomic localization. Fasting blood glucose: 95 mg/dl COMPARISON:  None. FINDINGS: (Background mediastinal blood pool activity: SUV max = 3.8) NECK: No hypermetabolic lymph nodes. Mild goiter is seen with diffuse hypermetabolic activity throughout the thyroid gland, consistent with thyroiditis. Incidental CT findings:  None. CHEST: No hypermetabolic masses or lymphadenopathy. No suspicious pulmonary nodules seen on CT images. Incidental CT findings:  None. ABDOMEN/PELVIS: No abnormal hypermetabolic activity within  the liver, pancreas, adrenal glands, or spleen. No hypermetabolic lymph nodes in the abdomen. No evidence of hydroureteronephrosis. Hypermetabolic soft  tissue mass in the cervix measures 6.3 x 6 2 cm, within SUV of 17.8. Bilateral hypermetabolic parametrial lymph nodes are seen, measuring 1.3 cm on the left with SUV max of 5.4, and measuring 1.9 cm on the right with SUV max of 11.3. An 8 mm right perirectal lymph node and sub-cm left internal iliac lymph node also show mild hypermetabolic activity. Mild hypermetabolic bilateral external iliac lymphadenopathy is seen, largest on the left measuring 11 mm with SUV max of 3.9, and on the right measuring 1.3 cm with SUV max of 6.6. Incidental CT findings:  None. SKELETON:  No hypermetabolic bone lesions identified. Incidental CT findings:  None. IMPRESSION: 6 cm hypermetabolic cervical mass, consistent with primary cervical carcinoma. Mild hypermetabolic bilateral parametrial, right perirectal, and bilateral iliac lymph nodes, consistent with metastatic disease. No evidence of metastatic disease within the abdomen, chest, or neck. Diffuse thyroiditis incidentally noted. Recommend correlation with thyroid function tests. Electronically Signed   By: Earle Gell M.D.   On: 03/28/2018 14:33     Assessment and plan- Patient is a 33 y.o. female with adenocarcinoma of the cervix FIGO stage III C1r T2N1M0with metastases to the external iliac(pelvic lymph nodes).  She is here for on treatment assessment prior to cycle 2 of weekly cisplatin chemotherapy  Counts okay to proceed with cycle 2 of weekly cisplatin chemotherapy tomorrow.  I will see her back in 1 week's time with CBC and CMP for cycle 3  Iron deficiency anemia: She will receive first dose of Feraheme with chemotherapy tomorrow.  Again risks and benefits of Feraheme were discussed including all but not limited to headaches, leg swelling and possible risk of infusion reaction.  Patient understands and agrees to  proceed as planned.  She will receive her second dose with cycle 3 of chemotherapy.  Hypokalemia: She will get 20 mEq of extra potassium along with her planned 20 mEq with IV fluids tomorrow.  Chemo-induced nausea vomiting: Better controlled with Zofran ODT.  I have asked her to try to minimize the use of Zofran may be to 3-4 times a day given that she is on concomitant methadone and there is a risk of QTC prolongation.  Thrombocytosis: Likely secondary to iron deficiency anemia and ongoing cervical cancer.  Continue to monitor  We will also's tentatively schedule her for extra IV fluids and/or potassium for Friday this week.   Visit Diagnosis 1. Encounter for antineoplastic chemotherapy   2. 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[place])   3. Chemotherapy induced nausea and vomiting   4. Reactive thrombocytosis   5. Hypokalemia      Dr. Randa Evens, MD, MPH [facility] [phone] 04/18/2018 9:49 AM

## 2018-04-19 ENCOUNTER — Other Ambulatory Visit: Payer: Self-pay | Admitting: Oncology

## 2018-04-19 ENCOUNTER — Ambulatory Visit: Payer: Medicaid Other

## 2018-04-19 ENCOUNTER — Inpatient Hospital Stay: Payer: Medicaid Other

## 2018-04-19 ENCOUNTER — Ambulatory Visit
Admission: RE | Admit: 2018-04-19 | Discharge: 2018-04-19 | Disposition: A | Discharge status: Home / Self Care | Payer: Medicaid Other | Source: Ambulatory Visit | Attending: Radiation Oncology | Admitting: Radiation Oncology

## 2018-04-19 VITALS — BP 126/81 | HR 100 | Temp 95.8°F | Resp 20

## 2018-04-19 DIAGNOSIS — Z5111 Encounter for antineoplastic chemotherapy: Secondary | ICD-10-CM | POA: Diagnosis not present

## 2018-04-19 DIAGNOSIS — C539 Malignant neoplasm of cervix uteri, unspecified: Secondary | ICD-10-CM

## 2018-04-19 DIAGNOSIS — D500 Iron deficiency anemia secondary to blood loss (chronic): Secondary | ICD-10-CM

## 2018-04-19 DIAGNOSIS — Z510 Encounter for antineoplastic radiation therapy: Secondary | ICD-10-CM | POA: Diagnosis not present

## 2018-04-19 MED ORDER — KETOROLAC TROMETHAMINE 15 MG/ML IJ SOLN
30.0000 mg | Freq: Once | INTRAMUSCULAR | Status: AC
  Administered 2018-04-19: 30 mg via INTRAVENOUS
  Filled 2018-04-19: qty 2

## 2018-04-19 MED ORDER — SODIUM CHLORIDE 0.9 % IV SOLN
Freq: Once | INTRAVENOUS | Status: AC
  Administered 2018-04-19: 13:00:00 via INTRAVENOUS
  Filled 2018-04-19: qty 250

## 2018-04-19 MED ORDER — POTASSIUM CHLORIDE 2 MEQ/ML IV SOLN: Freq: Once | INTRAVENOUS | Status: DC

## 2018-04-19 MED ORDER — HEPARIN SOD (PORK) LOCK FLUSH 100 UNIT/ML IV SOLN
500.0000 [IU] | Freq: Once | INTRAVENOUS | Status: AC | PRN
  Administered 2018-04-19: 500 [IU]
  Filled 2018-04-19: qty 5

## 2018-04-19 MED ORDER — SODIUM CHLORIDE 0.9 % IV SOLN
Freq: Once | INTRAVENOUS | Status: DC
  Filled 2018-04-19: qty 5

## 2018-04-19 MED ORDER — SODIUM CHLORIDE 0.9 % IV SOLN
Freq: Once | INTRAVENOUS | Status: AC
  Filled 2018-04-19: qty 250

## 2018-04-19 MED ORDER — SODIUM CHLORIDE 0.9 % IV SOLN
510.0000 mg | Freq: Once | INTRAVENOUS | Status: AC
  Administered 2018-04-19: 510 mg via INTRAVENOUS
  Filled 2018-04-19: qty 17

## 2018-04-19 MED ORDER — POTASSIUM CHLORIDE 2 MEQ/ML IV SOLN
INTRAVENOUS | Status: DC
  Administered 2018-04-19: 10:00:00 via INTRAVENOUS
  Filled 2018-04-19 (×2): qty 1000

## 2018-04-19 MED ORDER — SODIUM CHLORIDE 0.9% FLUSH
10.0000 mL | INTRAVENOUS | Status: DC | PRN
  Filled 2018-04-19: qty 10

## 2018-04-19 MED ORDER — SODIUM CHLORIDE 0.9 % IV SOLN
40.0000 mg/m2 | Freq: Once | INTRAVENOUS | Status: DC
  Filled 2018-04-19: qty 90

## 2018-04-19 MED ORDER — SODIUM CHLORIDE 0.9 % IV SOLN
Freq: Once | INTRAVENOUS | Status: AC
  Administered 2018-04-19: 12:00:00 via INTRAVENOUS
  Filled 2018-04-19: qty 250

## 2018-04-19 MED ORDER — PALONOSETRON HCL INJECTION 0.25 MG/5ML
0.2500 mg | Freq: Once | INTRAVENOUS | Status: AC
  Administered 2018-04-19: 0.25 mg via INTRAVENOUS
  Filled 2018-04-19: qty 5

## 2018-04-19 MED ORDER — SODIUM CHLORIDE 0.9 % IV SOLN
Freq: Once | INTRAVENOUS | Status: AC
  Administered 2018-04-19: 09:00:00 via INTRAVENOUS
  Filled 2018-04-19: qty 250

## 2018-04-19 NOTE — Progress Notes (Signed)
toradol

## 2018-04-19 NOTE — Progress Notes (Signed)
Patient able to only void 50ml after 2hrs of pre fluids. Per Dr Janese Banks give 510ml bolus over 30 minutes. Bolus given, additional 71ml urine output only. Per Dr Janese Banks give another 537ml bolus. Will continue to monitor.  Patient unable to void 27ml requirement prior to receiving cisplatin. Notified Dr Janese Banks and patient rescheduled for 0830 on 1/15. Instructed patient to drink plenty of fluids, non caffeinated. Patient verbalized understanding.

## 2018-04-20 ENCOUNTER — Ambulatory Visit
Admission: RE | Admit: 2018-04-20 | Discharge: 2018-04-20 | Disposition: A | Discharge status: Home / Self Care | Payer: Medicaid Other | Source: Ambulatory Visit | Attending: Radiation Oncology | Admitting: Radiation Oncology

## 2018-04-20 ENCOUNTER — Other Ambulatory Visit: Payer: Self-pay | Admitting: Oncology

## 2018-04-20 ENCOUNTER — Ambulatory Visit: Payer: Medicaid Other

## 2018-04-20 ENCOUNTER — Inpatient Hospital Stay: Payer: Medicaid Other

## 2018-04-20 VITALS — BP 141/89 | HR 89 | Temp 98.8°F | Resp 20 | Wt 229.2 lb

## 2018-04-20 DIAGNOSIS — C539 Malignant neoplasm of cervix uteri, unspecified: Secondary | ICD-10-CM

## 2018-04-20 DIAGNOSIS — Z5111 Encounter for antineoplastic chemotherapy: Secondary | ICD-10-CM | POA: Diagnosis not present

## 2018-04-20 DIAGNOSIS — Z510 Encounter for antineoplastic radiation therapy: Secondary | ICD-10-CM | POA: Diagnosis not present

## 2018-04-20 MED ORDER — SODIUM CHLORIDE 0.9 % IV SOLN
40.0000 mg/m2 | Freq: Once | INTRAVENOUS | Status: AC
  Administered 2018-04-20: 90 mg via INTRAVENOUS
  Filled 2018-04-20: qty 90

## 2018-04-20 MED ORDER — SODIUM CHLORIDE 0.9 % IV SOLN
Freq: Once | INTRAVENOUS | Status: AC
  Administered 2018-04-20: 11:00:00 via INTRAVENOUS
  Filled 2018-04-20: qty 5

## 2018-04-20 MED ORDER — POTASSIUM CHLORIDE 2 MEQ/ML IV SOLN
Freq: Once | INTRAVENOUS | Status: AC
  Administered 2018-04-20: 09:00:00 via INTRAVENOUS
  Filled 2018-04-20: qty 1000

## 2018-04-20 MED ORDER — HEPARIN SOD (PORK) LOCK FLUSH 100 UNIT/ML IV SOLN
500.0000 [IU] | Freq: Once | INTRAVENOUS | Status: AC | PRN
  Administered 2018-04-20: 500 [IU]

## 2018-04-20 MED ORDER — SODIUM CHLORIDE 0.9 % IV SOLN
Freq: Once | INTRAVENOUS | Status: AC
  Administered 2018-04-20: 10:00:00 via INTRAVENOUS
  Filled 2018-04-20: qty 250

## 2018-04-21 ENCOUNTER — Ambulatory Visit
Admission: RE | Admit: 2018-04-21 | Discharge: 2018-04-21 | Disposition: A | Discharge status: Home / Self Care | Payer: Medicaid Other | Source: Ambulatory Visit | Attending: Radiation Oncology | Admitting: Radiation Oncology

## 2018-04-21 ENCOUNTER — Other Ambulatory Visit: Payer: Self-pay

## 2018-04-21 DIAGNOSIS — Z510 Encounter for antineoplastic radiation therapy: Secondary | ICD-10-CM | POA: Diagnosis not present

## 2018-04-22 ENCOUNTER — Telehealth: Payer: Self-pay | Admitting: *Deleted

## 2018-04-22 ENCOUNTER — Inpatient Hospital Stay: Payer: Medicaid Other

## 2018-04-22 ENCOUNTER — Inpatient Hospital Stay (HOSPITAL_BASED_OUTPATIENT_CLINIC_OR_DEPARTMENT_OTHER): Payer: Medicaid Other | Admitting: Nurse Practitioner

## 2018-04-22 ENCOUNTER — Ambulatory Visit
Admission: RE | Admit: 2018-04-22 | Discharge: 2018-04-22 | Disposition: A | Discharge status: Home / Self Care | Payer: Medicaid Other | Source: Ambulatory Visit | Attending: Radiation Oncology | Admitting: Radiation Oncology

## 2018-04-22 ENCOUNTER — Encounter: Payer: Self-pay | Admitting: Nurse Practitioner

## 2018-04-22 VITALS — BP 130/86 | HR 83 | Temp 97.3°F | Resp 18

## 2018-04-22 DIAGNOSIS — Z5111 Encounter for antineoplastic chemotherapy: Secondary | ICD-10-CM | POA: Diagnosis not present

## 2018-04-22 DIAGNOSIS — T451X5A Adverse effect of antineoplastic and immunosuppressive drugs, initial encounter: Secondary | ICD-10-CM

## 2018-04-22 DIAGNOSIS — R110 Nausea: Secondary | ICD-10-CM

## 2018-04-22 DIAGNOSIS — C778 Secondary and unspecified malignant neoplasm of lymph nodes of multiple regions: Secondary | ICD-10-CM

## 2018-04-22 DIAGNOSIS — Z5189 Encounter for other specified aftercare: Secondary | ICD-10-CM

## 2018-04-22 DIAGNOSIS — Z720 Tobacco use: Secondary | ICD-10-CM

## 2018-04-22 DIAGNOSIS — Z95828 Presence of other vascular implants and grafts: Secondary | ICD-10-CM

## 2018-04-22 DIAGNOSIS — R112 Nausea with vomiting, unspecified: Secondary | ICD-10-CM

## 2018-04-22 DIAGNOSIS — G893 Neoplasm related pain (acute) (chronic): Secondary | ICD-10-CM

## 2018-04-22 DIAGNOSIS — Z510 Encounter for antineoplastic radiation therapy: Secondary | ICD-10-CM | POA: Diagnosis not present

## 2018-04-22 DIAGNOSIS — R630 Anorexia: Secondary | ICD-10-CM

## 2018-04-22 DIAGNOSIS — C539 Malignant neoplasm of cervix uteri, unspecified: Secondary | ICD-10-CM

## 2018-04-22 DIAGNOSIS — E876 Hypokalemia: Secondary | ICD-10-CM

## 2018-04-22 LAB — BASIC METABOLIC PANEL
Anion gap: 11 (ref 5–15)
BUN: 13 mg/dL (ref 6–20)
CO2: 26 mmol/L (ref 22–32)
Calcium: 8.9 mg/dL (ref 8.9–10.3)
Chloride: 101 mmol/L (ref 98–111)
Creatinine, Ser: 0.62 mg/dL (ref 0.44–1.00)
Glucose, Bld: 113 mg/dL — ABNORMAL HIGH (ref 70–99)
Potassium: 3.3 mmol/L — ABNORMAL LOW (ref 3.5–5.1)
Sodium: 138 mmol/L (ref 135–145)

## 2018-04-22 MED ORDER — PROMETHAZINE HCL 25 MG PO TABS: 12.5000 mg | ORAL_TABLET | Freq: Two times a day (BID) | ORAL | 0 refills | Status: DC | PRN

## 2018-04-22 MED ORDER — PROMETHAZINE HCL 25 MG PO TABS
25.0000 mg | ORAL_TABLET | Freq: Once | ORAL | Status: AC
  Administered 2018-04-22: 25 mg via ORAL
  Filled 2018-04-22: qty 1

## 2018-04-22 MED ORDER — SODIUM CHLORIDE 0.9 % IV SOLN
Freq: Once | INTRAVENOUS | Status: AC
  Administered 2018-04-22: 11:00:00 via INTRAVENOUS
  Filled 2018-04-22: qty 250

## 2018-04-22 MED ORDER — SODIUM CHLORIDE 0.9% FLUSH
10.0000 mL | INTRAVENOUS | Status: DC | PRN
  Administered 2018-04-22: 10 mL via INTRAVENOUS
  Filled 2018-04-22: qty 10

## 2018-04-22 MED ORDER — KETOROLAC TROMETHAMINE 15 MG/ML IJ SOLN
15.0000 mg | Freq: Once | INTRAMUSCULAR | Status: AC
  Administered 2018-04-22: 15 mg via INTRAVENOUS
  Filled 2018-04-22: qty 1

## 2018-04-22 MED ORDER — HEPARIN SOD (PORK) LOCK FLUSH 100 UNIT/ML IV SOLN
500.0000 [IU] | Freq: Once | INTRAVENOUS | Status: AC
  Administered 2018-04-22: 500 [IU] via INTRAVENOUS

## 2018-04-22 NOTE — Patient Instructions (Addendum)
It is common for patients who are undergoing treatment and taking certain prescribed medications to experience side-effects with constipation.  If you experience constipation, please take stool softeners such as Senna and/or Miralax every day to avoid constipation.  These medications are available over the counter.  Of course, if you have diarrhea, stop taking stool softeners.  Drinking plenty of fluid, eating fruits and vegetable, and being active also reduces the risk of constipation.   If despite taking stool softeners, and you still have no bowel movement for 2 days or more than your normal bowel habit frequency, please take one of the following over the counter laxatives:  Milk of Magnesia or Mag Citrate everyday and contact me immediately for further instructions.  The goal is to have at least one bowel movement every day or every other day without pain or straining.   It was a pleasure meeting you today and thank you for allowing me to participate in your care. -Beckey Rutter, NP   Promethazine tablets What is this medicine? PROMETHAZINE (proe METH a zeen) is an antihistamine. It is used to treat allergic reactions and to treat or prevent nausea and vomiting from illness or motion sickness. It is also used to make you sleep before surgery, and to help treat pain or nausea after surgery. This medicine may be used for other purposes; ask your health care provider or pharmacist if you have questions. COMMON BRAND NAME(S): Phenergan What should I tell my health care provider before I take this medicine? They need to know if you have any of these conditions: -glaucoma -high blood pressure or heart disease -kidney disease -liver disease -lung or breathing disease, like asthma -prostate trouble -pain or difficulty passing urine -seizures -an unusual or allergic reaction to promethazine or phenothiazines, other medicines, foods, dyes, or preservatives -pregnant or trying to get  pregnant -breast-feeding How should I use this medicine? Take this medicine by mouth with a glass of water. Follow the directions on the prescription label. Take your doses at regular intervals. Do not take your medicine more often than directed. Talk to your pediatrician regarding the use of this medicine in children. Special care may be needed. This medicine should not be given to infants and children younger than 37 years old. Overdosage: If you think you have taken too much of this medicine contact a poison control center or emergency room at once. NOTE: This medicine is only for you. Do not share this medicine with others. What if I miss a dose? If you miss a dose, take it as soon as you can. If it is almost time for your next dose, take only that dose. Do not take double or extra doses. What may interact with this medicine? Do not take this medicine with any of the following medications: -cisapride -dofetilide -dronedarone -MAOIs like Carbex, Eldepryl, Marplan, Nardil, Parnate -pimozide -quinidine, including dextromethorphan; quinidine -thioridazine -ziprasidone This medicine may also interact with the following medications: -certain medicines for depression, anxiety, or psychotic disturbances -certain medicines for anxiety or sleep -certain medicines for seizures like carbamazepine, phenobarbital, phenytoin -certain medicines for movement abnormalities as in Parkinson's disease, or for gastrointestinal problems -epinephrine -medicines for allergies or colds -muscle relaxants -narcotic medicines for pain -other medicines that prolong the QT interval (cause an abnormal heart rhythm) -tramadol -trimethobenzamide This list may not describe all possible interactions. Give your health care provider a list of all the medicines, herbs, non-prescription drugs, or dietary supplements you use. Also tell them if you smoke,  drink alcohol, or use illegal drugs. Some items may interact with  your medicine. What should I watch for while using this medicine? Tell your doctor or health care professional if your symptoms do not start to get better in 1 to 2 days. You may get drowsy or dizzy. Do not drive, use machinery, or do anything that needs mental alertness until you know how this medicine affects you. To reduce the risk of dizzy or fainting spells, do not stand or sit up quickly, especially if you are an older patient. Alcohol may increase dizziness and drowsiness. Avoid alcoholic drinks. Your mouth may get dry. Chewing sugarless gum or sucking hard candy, and drinking plenty of water may help. Contact your doctor if the problem does not go away or is severe. This medicine may cause dry eyes and blurred vision. If you wear contact lenses you may feel some discomfort. Lubricating drops may help. See your eye doctor if the problem does not go away or is severe. This medicine can make you more sensitive to the sun. Keep out of the sun. If you cannot avoid being in the sun, wear protective clothing and use sunscreen. Do not use sun lamps or tanning beds/booths. If you are diabetic, check your blood-sugar levels regularly. What side effects may I notice from receiving this medicine? Side effects that you should report to your doctor or health care professional as soon as possible: -blurred vision -irregular heartbeat, palpitations or chest pain -muscle or facial twitches -pain or difficulty passing urine -seizures -skin rash -slowed or shallow breathing -unusual bleeding or bruising -yellowing of the eyes or skin Side effects that usually do not require medical attention (report to your doctor or health care professional if they continue or are bothersome): -headache -nightmares, agitation, nervousness, excitability, not able to sleep (these are more likely in children) -stuffy nose This list may not describe all possible side effects. Call your doctor for medical advice about side  effects. You may report side effects to FDA at 1-800-FDA-1088. Where should I keep my medicine? Keep out of the reach of children. Store at room temperature, between 20 and 25 degrees C (68 and 77 degrees F). Protect from light. Throw away any unused medicine after the expiration date. NOTE: This sheet is a summary. It may not cover all possible information. If you have questions about this medicine, talk to your doctor, pharmacist, or health care provider.  2019 Elsevier/Gold Standard (2012-11-22 15:04:46)

## 2018-04-22 NOTE — Progress Notes (Signed)
Symptom Management [place]  [phone] [phone]  Patient Care Team: [name], MD as PCP - General (Family Medicine) Clent Jacks, RN as Registered Nurse   Name of the patient: Kristy Gordon  [id number]  Nov 03, 1985   Date of visit: 04/22/18  Diagnosis-cervical cancer  Chief complaint/ Reason for visit-chemotherapy induced nausea  Heme/Onc history:  Oncology History   Patient presented as a 33 year old, G0, to ER on 03/15/2018 with symptoms of abdominal pain and cramping. She has also been having irregular menstrual bleeding and spotting on and off for the last few months.She had an ultrasound pelvis done in the ER which showed a large 5.8 cm hypoechoic vascular mass within the cervix and the lower uterine segment. Patient had last seen GYN in 2013 and had not had a Pap smear done since then. Patient was seen by GYN Dr. Nechama Guard on 1213 and was found to have firm friable cervix and multiple biopsies were taken which showed adenocarcinoma. She was then seen by Dr. Fransisca Connors from GYN oncology. Pelvic exam showed anteverted cervix that was replaced by tumor and extension of cancer into the left parametrium and possibly to the pelvic sidewall.  PET CT scan on 03/28/2018 showed 6 cm hypermetabolic cervical mass consistent with primary cervical carcinoma. Mild hypermetabolic bilateral parametrial, right perirectal,  bilateral iliac lymph nodes,  consistent with metastatic disease. No evidence of metastatic disease within the abdomen, chest, or neck.  Cycle 1 of weekly cisplatin and radiation started on 04/11/2018     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[place])   03/23/2018 Initial Diagnosis    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[place])    04/01/2018 Cancer Staging    Staging form: Cervix Uteri, AJCC 8th Edition - Clinical stage from 04/01/2018: FIGO Stage III (cT3, cN1, cM0) - Signed by Sindy Guadeloupe, MD on 04/01/2018    04/11/2018 -   Chemotherapy    The patient had palonosetron (ALOXI) injection 0.25 mg, 0.25 mg, Intravenous,  Once, 2 of 4 cycles Administration: 0.25 mg (04/11/2018), 0.25 mg (04/19/2018) CISplatin (PLATINOL) 90 mg in sodium chloride 0.9 % 250 mL chemo infusion, 40 mg/m2 = 90 mg, Intravenous,  Once, 2 of 4 cycles Administration: 90 mg (04/11/2018), 90 mg (04/20/2018) fosaprepitant (EMEND) 150 mg, dexamethasone (DECADRON) 12 mg in sodium chloride 0.9 % 145 mL IVPB, , Intravenous,  Once, 2 of 4 cycles Administration:  (04/11/2018),  (04/20/2018)  for chemotherapy treatment.      Interval history-   Kristy Gordon is a 33 y.o. female who presents for evaluation of nausea present for ~ 12 hours.  Associated signs & symptoms:  moderate abdominal pain and ability to keep down some fluids.  She has had similar symptoms ongoing since starting concurrent chemoradiation for treatment of cervical cancer.  She took Zofran and Compazine this morning as well as her prescribed steroids without significant improvement in her symptoms.  Reports poor oral intake but able to tolerate food and fluids.  Ate Ramen noodles this morning with her steroids.  Denies vomiting.  States she was afraid of getting sicker over the weekend and wanted to be evaluated.  ECOG FS:1 - Symptomatic but completely ambulatory  Review of systems- Review of Systems  Constitutional: Negative.   HENT: Negative.   Eyes: Negative.   Respiratory: Negative.   Cardiovascular: Negative.   Gastrointestinal: Positive for abdominal pain (LLQ pain- ongoing rated 4/10; has not taken methadone today), heartburn and nausea. Negative for blood in stool,  constipation, diarrhea, melena and vomiting.  Genitourinary: Negative.   Musculoskeletal: Negative.   Skin: Negative.   Neurological: Negative.   Endo/Heme/Allergies: Negative.   Psychiatric/Behavioral: Negative.     Current treatment- Concurrent cisplatin and radiation  Allergies  Allergen Reactions  . Amoxicillin  Rash  . Penicillins Hives    Past Medical History:  Diagnosis Date  . Anxiety   . Cancer (HCC)    cervical cancer  . Hashimoto's thyroiditis   . Hypothyroid     Past Surgical History:  Procedure Laterality Date  . BUNIONECTOMY Right   . PORTA CATH INSERTION N/A 03/31/2018   Procedure: PORTA CATH INSERTION;  Surgeon: Algernon Huxley, MD;  Location: [facility];  Service: Cardiovascular;  Laterality: N/A;  . TONSILLECTOMY      Social History   Socioeconomic History  . Marital status: Married    Spouse name: Not on file  . Number of children: Not on file  . Years of education: Not on file  . Highest education level: Not on file  Occupational History  . Not on file  Social Needs  . Financial resource strain: Not on file  . Food insecurity:    Worry: Not on file    Inability: Not on file  . Transportation needs:    Medical: Not on file    Non-medical: Not on file  Tobacco Use  . Smoking status: Current Every Day Smoker    Packs/day: 1.00    Years: 10.00    Pack years: 10.00    Types: Cigarettes  . Smokeless tobacco: Never Used  Substance and Sexual Activity  . Alcohol use: Yes    Comment: rare  . Drug use: Yes    Types: Marijuana    Comment: quit opiates 5 years ago  . Sexual activity: Yes    Birth control/protection: None  Lifestyle  . Physical activity:    Days per week: Not on file    Minutes per session: Not on file  . Stress: Not on file  Relationships  . Social connections:    Talks on phone: Not on file    Gets together: Not on file    Attends religious service: Not on file    Active member of club or organization: Not on file    Attends meetings of clubs or organizations: Not on file    Relationship status: Not on file  . Intimate partner violence:    Fear of current or ex partner: Not on file    Emotionally abused: Not on file    Physically abused: Not on file    Forced sexual activity: Not on file  Other Topics Concern  . Not on file   Social History Narrative  . Not on file    Family History  Adopted: Yes  Family history unknown: Yes    Current Outpatient Medications:  .  acetaminophen (TYLENOL) 500 MG tablet, Take 500 mg by mouth every 4 (four) hours as needed., Disp: , Rfl:  .  ALPRAZolam (XANAX) 1 MG tablet, Take 1 tablet (1 mg total) by mouth 3 (three) times daily as needed for anxiety., Disp: 90 tablet, Rfl: 0 .  citalopram (CELEXA) 40 MG tablet, Take 1 tablet (40 mg total) by mouth daily., Disp: , Rfl:  .  dexamethasone (DECADRON) 4 MG tablet, Take 2 tablets by mouth once a day on the day after chemotherapy and then take 2 tablets two times a day for 2 days. Take with food., Disp: 30  tablet, Rfl: 1 .  levothyroxine (SYNTHROID, LEVOTHROID) 112 MCG tablet, Take 2 tablets (224 mcg total) by mouth daily., Disp: 60 tablet, Rfl: 5 .  lidocaine-prilocaine (EMLA) cream, Apply to affected area once, Disp: 30 g, Rfl: 3 .  methadone (DOLOPHINE) 5 MG tablet, Take 15 mg by mouth daily. Once daily, Disp: , Rfl:  .  ondansetron (ZOFRAN-ODT) 4 MG disintegrating tablet, Take 1 tablet (4 mg total) by mouth every 6 (six) hours as needed for nausea or vomiting., Disp: 30 tablet, Rfl: 0 .  pantoprazole (PROTONIX) 20 MG tablet, Take 1 tablet (20 mg total) by mouth daily., Disp: 30 tablet, Rfl: 3 .  prochlorperazine (COMPAZINE) 10 MG tablet, Take 1 tablet (10 mg total) by mouth every 6 (six) hours as needed (Nausea or vomiting)., Disp: 30 tablet, Rfl: 1 .  promethazine (PHENERGAN) 25 MG tablet, Take 0.5-1 tablets (12.5-25 mg total) by mouth every 12 (twelve) hours as needed for vomiting or refractory nausea / vomiting., Disp: 30 tablet, Rfl: 0 No current facility-administered medications for this visit.   Facility-Administered Medications Ordered in Other Visits:  .  0.9 %  sodium chloride infusion, , Intravenous, Continuous, Sindy Guadeloupe, MD, Stopped at 04/15/18 1001 .  sodium chloride flush (NS) 0.9 % injection 10 mL, 10 mL,  Intravenous, PRN, Sindy Guadeloupe, MD, 10 mL at 04/15/18 0825 .  sodium chloride flush (NS) 0.9 % injection 10 mL, 10 mL, Intravenous, PRN, Verlon Au, NP, 10 mL at 04/22/18 1104  Physical exam:  Vitals:   04/22/18 1105 04/22/18 1135  BP: 130/86   Pulse: 83   Resp:  18  Temp: (!) 97.3 F (36.3 C)   TempSrc: Tympanic    Physical Exam Constitutional:      General: She is not in acute distress.    Appearance: She is obese. She is not ill-appearing.     Comments: Accompanied by mother  HENT:     Head: Normocephalic and atraumatic.     Mouth/Throat:     Mouth: Mucous membranes are dry.     Pharynx: Oropharynx is clear.  Eyes:     General: No scleral icterus.    Conjunctiva/sclera: Conjunctivae normal.  Cardiovascular:     Rate and Rhythm: Normal rate and regular rhythm.     Heart sounds: Normal heart sounds.  Pulmonary:     Effort: Pulmonary effort is normal.     Breath sounds: Normal breath sounds.  Abdominal:     General: Bowel sounds are normal. There is no distension.     Palpations: Abdomen is soft.     Tenderness: There is abdominal tenderness (LLQ mildly ttp). There is no guarding or rebound.  Skin:    General: Skin is warm and dry.  Neurological:     Mental Status: She is alert and oriented to person, place, and time.  Psychiatric:        Mood and Affect: Mood is anxious.      CMP Latest Ref Rng & Units 04/22/2018  Glucose 70 - 99 mg/dL 113(H)  BUN 6 - 20 mg/dL 13  Creatinine 0.44 - 1.00 mg/dL 0.62  Sodium 135 - 145 mmol/L 138  Potassium 3.5 - 5.1 mmol/L 3.3(L)  Chloride 98 - 111 mmol/L 101  CO2 22 - 32 mmol/L 26  Calcium 8.9 - 10.3 mg/dL 8.9  Total Protein 6.5 - 8.1 g/dL -  Total Bilirubin 0.3 - 1.2 mg/dL -  Alkaline Phos 38 - 126 U/L -  AST 15 -  41 U/L -  ALT 0 - 44 U/L -   CBC Latest Ref Rng & Units 04/18/2018  WBC 4.0 - 10.5 K/uL 12.3(H)  Hemoglobin 12.0 - 15.0 g/dL 9.7(L)  Hematocrit 36.0 - 46.0 % 31.4(L)  Platelets 150 - 400 K/uL 738(H)     No images are attached to the encounter.  Nm Pet Image Initial (pi) Skull Base To Thigh  Result Date: 03/28/2018 CLINICAL DATA:  Initial treatment strategy for cervical adenocarcinoma. EXAM: NUCLEAR MEDICINE PET SKULL BASE TO THIGH TECHNIQUE: 12.6 mCi F-18 FDG was injected intravenously. Full-ring PET imaging was performed from the skull base to thigh after the radiotracer. CT data was obtained and used for attenuation correction and anatomic localization. Fasting blood glucose: 95 mg/dl COMPARISON:  None. FINDINGS: (Background mediastinal blood pool activity: SUV max = 3.8) NECK: No hypermetabolic lymph nodes. Mild goiter is seen with diffuse hypermetabolic activity throughout the thyroid gland, consistent with thyroiditis. Incidental CT findings:  None. CHEST: No hypermetabolic masses or lymphadenopathy. No suspicious pulmonary nodules seen on CT images. Incidental CT findings:  None. ABDOMEN/PELVIS: No abnormal hypermetabolic activity within the liver, pancreas, adrenal glands, or spleen. No hypermetabolic lymph nodes in the abdomen. No evidence of hydroureteronephrosis. Hypermetabolic soft tissue mass in the cervix measures 6.3 x 6 2 cm, within SUV of 17.8. Bilateral hypermetabolic parametrial lymph nodes are seen, measuring 1.3 cm on the left with SUV max of 5.4, and measuring 1.9 cm on the right with SUV max of 11.3. An 8 mm right perirectal lymph node and sub-cm left internal iliac lymph node also show mild hypermetabolic activity. Mild hypermetabolic bilateral external iliac lymphadenopathy is seen, largest on the left measuring 11 mm with SUV max of 3.9, and on the right measuring 1.3 cm with SUV max of 6.6. Incidental CT findings:  None. SKELETON:  No hypermetabolic bone lesions identified. Incidental CT findings:  None. IMPRESSION: 6 cm hypermetabolic cervical mass, consistent with primary cervical carcinoma. Mild hypermetabolic bilateral parametrial, right perirectal, and bilateral iliac lymph  nodes, consistent with metastatic disease. No evidence of metastatic disease within the abdomen, chest, or neck. Diffuse thyroiditis incidentally noted. Recommend correlation with thyroid function tests. Electronically Signed   By: Earle Gell M.D.   On: 03/28/2018 14:33    Assessment and plan- Patient is a 33 y.o. female diagnosed with stage III cervical cancer who presents to symptom management clinic for nausea and pain related to malignancy.   1.  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 Currently s/p cycle 2.   2.  Chemotherapy-induced nausea-BMP today overall stable (see below). Continue zofran and compazine with steroids. Discussed risk of qtc prolongation as rationale for avoiding zyprexa at this time and will start phenergan- 25mg  tablet given in clinic with improvement in symptoms.  Dietary guidelines discussed.  IV fluids given in clinic.  3.  Hypokalemia-has received extra IV potassium during infusions. K 3.3 today. Stable and no active vomiting or diarrhea- suspect r/t poor oral intake. Encouraged oral intake of potassium rich foods. Recheck and consider additional IV supplementation at next visit.    4. Pain associated with malignancy- LLQ pelvic pain- chronic. 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 Previously had improvement with Toradol and requests again today. Risks discussed in detail with patient today and proceeded to toradol 15mg  IV. Per review of UpToDate, [place], 2017, a randomized double blinded trial,  demonstrated that a  lower dose of ketorolac 10 mg IV produced similar analgesic effects as 15 mg and 30 mg IV doses in ER patients with acute pain. Given this, would consider lower dose at in the future.    Follow-up with Dr. Janese Banks as scheduled for 04/25/2018.    Visit Diagnosis 1. Dehydration   2. 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[place])   3.  Chemotherapy-induced nausea   4. Convalescence following combined treatment     Patient expressed understanding and was in agreement with this plan. She also understands that She can call clinic at any time with any questions, concerns, or complaints.   Thank you for allowing me to participate in the care of this very pleasant patient.   Beckey Rutter, DNP, AGNP-C [facility] [phone] (work cell) [phone] (office)  CC: Dr. Janese Banks

## 2018-04-22 NOTE — Progress Notes (Signed)
Patient here today to be evaluated in the symptom management clinic for nausea. She states that she has taken her zofran and compazine today, along with the dexamethasone, and she is still feeling nauseated. She is concerned about feeling sick over the weekend.

## 2018-04-22 NOTE — Telephone Encounter (Signed)
Patient called reporting that she is nauseated and her medicine is not controlling it, She wants IV fluids and IV medicine , she has radiation therapy at 1145 this morning. Per Beckey Rutter, NP, patient to come in now for lab, NP, IVFand medicine, Patient informed and will be here ASAP

## 2018-04-25 ENCOUNTER — Inpatient Hospital Stay: Payer: Medicaid Other

## 2018-04-25 ENCOUNTER — Other Ambulatory Visit: Payer: Self-pay | Admitting: *Deleted

## 2018-04-25 ENCOUNTER — Ambulatory Visit
Admission: RE | Admit: 2018-04-25 | Discharge: 2018-04-25 | Disposition: A | Discharge status: Home / Self Care | Payer: Medicaid Other | Source: Ambulatory Visit | Attending: Radiation Oncology | Admitting: Radiation Oncology

## 2018-04-25 ENCOUNTER — Ambulatory Visit: Payer: Self-pay | Admitting: Oncology

## 2018-04-25 ENCOUNTER — Other Ambulatory Visit: Payer: Self-pay

## 2018-04-25 ENCOUNTER — Inpatient Hospital Stay (HOSPITAL_BASED_OUTPATIENT_CLINIC_OR_DEPARTMENT_OTHER): Payer: Medicaid Other | Admitting: Oncology

## 2018-04-25 VITALS — BP 128/85 | HR 105 | Temp 98.3°F | Resp 18 | Wt 228.0 lb

## 2018-04-25 DIAGNOSIS — R112 Nausea with vomiting, unspecified: Secondary | ICD-10-CM

## 2018-04-25 DIAGNOSIS — Z720 Tobacco use: Secondary | ICD-10-CM

## 2018-04-25 DIAGNOSIS — Z510 Encounter for antineoplastic radiation therapy: Secondary | ICD-10-CM | POA: Diagnosis not present

## 2018-04-25 DIAGNOSIS — T451X5A Adverse effect of antineoplastic and immunosuppressive drugs, initial encounter: Secondary | ICD-10-CM

## 2018-04-25 DIAGNOSIS — R1030 Lower abdominal pain, unspecified: Secondary | ICD-10-CM

## 2018-04-25 DIAGNOSIS — Z5111 Encounter for antineoplastic chemotherapy: Secondary | ICD-10-CM

## 2018-04-25 DIAGNOSIS — R251 Tremor, unspecified: Secondary | ICD-10-CM

## 2018-04-25 DIAGNOSIS — C539 Malignant neoplasm of cervix uteri, unspecified: Secondary | ICD-10-CM

## 2018-04-25 DIAGNOSIS — R110 Nausea: Secondary | ICD-10-CM

## 2018-04-25 LAB — COMPREHENSIVE METABOLIC PANEL
ALT: 29 U/L (ref 0–44)
AST: 19 U/L (ref 15–41)
Albumin: 3.4 g/dL — ABNORMAL LOW (ref 3.5–5.0)
Alkaline Phosphatase: 116 U/L (ref 38–126)
Anion gap: 12 (ref 5–15)
BUN: 16 mg/dL (ref 6–20)
CALCIUM: 8.8 mg/dL — AB (ref 8.9–10.3)
CO2: 24 mmol/L (ref 22–32)
Chloride: 99 mmol/L (ref 98–111)
Creatinine, Ser: 0.55 mg/dL (ref 0.44–1.00)
GFR calc Af Amer: >60 mL/min (ref >60)
GFR calc non Af Amer: >60 mL/min (ref >60)
Glucose, Bld: 92 mg/dL (ref 70–99)
Potassium: 3.4 mmol/L — ABNORMAL LOW (ref 3.5–5.1)
Sodium: 135 mmol/L (ref 135–145)
Total Bilirubin: 0.7 mg/dL (ref 0.3–1.2)
Total Protein: 8.1 g/dL (ref 6.5–8.1)

## 2018-04-25 LAB — CBC WITH DIFFERENTIAL/PLATELET
ABS IMMATURE GRANULOCYTES: 0.03 10*3/uL (ref 0.00–0.07)
Basophils Absolute: 0 10*3/uL (ref 0.0–0.1)
Basophils Relative: 0 %
Eosinophils Absolute: 0.1 10*3/uL (ref 0.0–0.5)
Eosinophils Relative: 1 %
HCT: 34 % — ABNORMAL LOW (ref 36.0–46.0)
Hemoglobin: 10.5 g/dL — ABNORMAL LOW (ref 12.0–15.0)
Immature Granulocytes: 0 %
Lymphocytes Relative: 19 %
Lymphs Abs: 1.4 10*3/uL (ref 0.7–4.0)
MCH: 24.1 pg — ABNORMAL LOW (ref 26.0–34.0)
MCHC: 30.9 g/dL (ref 30.0–36.0)
MCV: 78 fL — ABNORMAL LOW (ref 80.0–100.0)
Monocytes Absolute: 0.7 10*3/uL (ref 0.1–1.0)
Monocytes Relative: 10 %
NEUTROS ABS: 5.1 10*3/uL (ref 1.7–7.7)
Neutrophils Relative %: 70 %
Platelets: 415 10*3/uL — ABNORMAL HIGH (ref 150–400)
RBC: 4.36 MIL/uL (ref 3.87–5.11)
RDW: 16 % — ABNORMAL HIGH (ref 11.5–15.5)
WBC: 7.4 10*3/uL (ref 4.0–10.5)
nRBC: 0 % (ref 0.0–0.2)

## 2018-04-25 MED ORDER — POTASSIUM CHLORIDE CRYS ER 20 MEQ PO TBCR: 20.0000 meq | EXTENDED_RELEASE_TABLET | Freq: Every day | ORAL | 0 refills | Status: DC

## 2018-04-25 NOTE — Progress Notes (Signed)
See MD today with labs and chemo for tomorrow. Felt shaky over the weekend. Eyes felt shaky. Pain is controlled as well as nausea. Pain is 3/10 this am. No vomiting. No edema. Vaginal bleeding yesterday. Clear vaginal discharge.

## 2018-04-26 ENCOUNTER — Ambulatory Visit
Admission: RE | Admit: 2018-04-26 | Discharge: 2018-04-26 | Disposition: A | Discharge status: Home / Self Care | Payer: Medicaid Other | Source: Ambulatory Visit | Attending: Radiation Oncology | Admitting: Radiation Oncology

## 2018-04-26 ENCOUNTER — Encounter: Payer: Self-pay | Admitting: Oncology

## 2018-04-26 ENCOUNTER — Inpatient Hospital Stay: Payer: Medicaid Other

## 2018-04-26 DIAGNOSIS — Z510 Encounter for antineoplastic radiation therapy: Secondary | ICD-10-CM | POA: Diagnosis not present

## 2018-04-26 NOTE — Progress Notes (Signed)
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 Positive pelvic LN  Chief complaint/ Reason for visit-on treatment assessment prior to cycle 3 of weekly cisplatin chemotherapy  Heme/Onc history: patient is a 33 year old female G27 who initially presented to the ER on 03/15/2018 with symptoms of abdominal pain and cramping. She has also been having irregular menstrual bleeding and spotting on and off for the last few months.She had an ultrasound pelvis done in the ER which showed a large 5.8 cm hypoechoic vascular mass within the cervix and the lower uterine segment. Patient had last seen GYN in 2013 and had not had a Pap smear done since then. Patient was seen by GYN Dr. Nechama Guard on 1213 and was found to have firm friable cervix and multiple biopsies were taken which showed adenocarcinoma. She was then seen by Dr. Fransisca Connors from GYN oncology. Pelvic exam showed anteverted cervix that was replaced by tumor and extension of cancer into the left parametrium and possibly to the pelvic sidewall.  PET CT scan on 03/28/2018 showed 6 cm hypermetabolic cervical mass consistent with primary cervical carcinoma. Mild hypermetabolic bilateral parametrial right perirectal bilateral iliac lymph nodes consistent with metastatic disease. No evidence of metastatic disease within the abdomen chest or neck.  Cycle 1 of weekly cisplatin and radiation started on 04/11/2018  Interval history-reports no significant nausea or vomiting today.  She does tell to become nauseous and a bit dehydrated and 3 days after chemotherapy.  She has been having some intermittent lower  abdominal pain.  Reports mild intermittent tremors in her hands since she received her IV iron  ECOG PS- 1 Pain scale- 0   Review of systems- Review of Systems  Constitutional: Positive for malaise/fatigue. Negative for chills, fever and weight loss.  HENT: Negative for congestion, ear discharge and nosebleeds.   Eyes: Negative for blurred vision.  Respiratory: Negative for cough, hemoptysis, sputum production, shortness of breath and wheezing.   Cardiovascular: Negative for chest pain, palpitations, orthopnea and claudication.  Gastrointestinal: Positive for abdominal pain. Negative for blood in stool, constipation, diarrhea, heartburn, melena, nausea and vomiting.  Genitourinary: Negative for dysuria, flank pain, frequency, hematuria and urgency.  Musculoskeletal: Negative for back pain, joint pain and myalgias.  Skin: Negative for rash.  Neurological: Positive for tremors. Negative for dizziness, tingling, focal weakness, seizures, weakness and headaches.  Endo/Heme/Allergies: Does not bruise/bleed easily.  Psychiatric/Behavioral: Negative for depression and suicidal ideas. The patient does not have insomnia.        Allergies  Allergen Reactions  . Amoxicillin Rash  . Penicillins Hives     Past Medical History:  Diagnosis Date  . Anxiety   . Cancer (HCC)    cervical cancer  . Hashimoto's thyroiditis   . Hypothyroid      Past Surgical History:  Procedure Laterality Date  . BUNIONECTOMY Right   . PORTA CATH INSERTION N/A 03/31/2018   Procedure: PORTA CATH INSERTION;  Surgeon: Algernon Huxley, MD;  Location: [place] [facility];  Service: Cardiovascular;  Laterality: N/A;  . TONSILLECTOMY      Social History   Socioeconomic History  . Marital status: Married    Spouse name: Not  on file  . Number of children: Not on file  . Years of education: Not on file  . Highest education level: Not on file  Occupational History  . Not on file  Social Needs  . Financial  resource strain: Not on file  . Food insecurity:    Worry: Not on file    Inability: Not on file  . Transportation needs:    Medical: Not on file    Non-medical: Not on file  Tobacco Use  . Smoking status: Current Every Day Smoker    Packs/day: 1.00    Years: 10.00    Pack years: 10.00    Types: Cigarettes  . Smokeless tobacco: Never Used  Substance and Sexual Activity  . Alcohol use: Yes    Comment: rare  . Drug use: Yes    Types: Marijuana    Comment: quit opiates 5 years ago  . Sexual activity: Yes    Birth control/protection: None  Lifestyle  . Physical activity:    Days per week: Not on file    Minutes per session: Not on file  . Stress: Not on file  Relationships  . Social connections:    Talks on phone: Not on file    Gets together: Not on file    Attends religious service: Not on file    Active member of club or organization: Not on file    Attends meetings of clubs or organizations: Not on file    Relationship status: Not on file  . Intimate partner violence:    Fear of current or ex partner: Not on file    Emotionally abused: Not on file    Physically abused: Not on file    Forced sexual activity: Not on file  Other Topics Concern  . Not on file  Social History Narrative  . Not on file    Family History  Adopted: Yes  Family history unknown: Yes     Current Outpatient Medications:  .  acetaminophen (TYLENOL) 500 MG tablet, Take 500 mg by mouth every 4 (four) hours as needed., Disp: , Rfl:  .  ALPRAZolam (XANAX) 1 MG tablet, Take 1 tablet (1 mg total) by mouth 3 (three) times daily as needed for anxiety., Disp: 90 tablet, Rfl: 0 .  citalopram (CELEXA) 40 MG tablet, Take 1 tablet (40 mg total) by mouth daily., Disp: , Rfl:  .  dexamethasone (DECADRON) 4 MG tablet, Take 2 tablets by mouth once a day on the day after chemotherapy and then take 2 tablets two times a day for 2 days. Take with food., Disp: 30 tablet, Rfl: 1 .  levothyroxine (SYNTHROID,  LEVOTHROID) 112 MCG tablet, Take 2 tablets (224 mcg total) by mouth daily., Disp: 60 tablet, Rfl: 5 .  lidocaine-prilocaine (EMLA) cream, Apply to affected area once, Disp: 30 g, Rfl: 3 .  methadone (DOLOPHINE) 5 MG tablet, Take 15 mg by mouth daily. Once daily, Disp: , Rfl:  .  ondansetron (ZOFRAN-ODT) 4 MG disintegrating tablet, Take 1 tablet (4 mg total) by mouth every 6 (six) hours as needed for nausea or vomiting., Disp: 30 tablet, Rfl: 0 .  pantoprazole (PROTONIX) 20 MG tablet, Take 1 tablet (20 mg total) by mouth daily., Disp: 30 tablet, Rfl: 3 .  prochlorperazine (COMPAZINE) 10 MG tablet, Take 1 tablet (10 mg total) by mouth every 6 (six) hours as needed (Nausea or vomiting)., Disp: 30 tablet, Rfl: 1 .  promethazine (PHENERGAN) 25 MG tablet, Take 0.5-1 tablets (12.5-25 mg  total) by mouth every 12 (twelve) hours as needed for vomiting or refractory nausea / vomiting., Disp: 30 tablet, Rfl: 0 .  potassium chloride SA (K-DUR,KLOR-CON) 20 MEQ tablet, Take 1 tablet (20 mEq total) by mouth daily., Disp: 7 tablet, Rfl: 0 No current facility-administered medications for this visit.   Facility-Administered Medications Ordered in Other Visits:  .  0.9 %  sodium chloride infusion, , Intravenous, Continuous, Sindy Guadeloupe, MD, Stopped at 04/15/18 1001 .  sodium chloride flush (NS) 0.9 % injection 10 mL, 10 mL, Intravenous, PRN, Sindy Guadeloupe, MD, 10 mL at 04/15/18 0825  Physical exam:  Vitals:   04/25/18 1030  BP: 128/85  Pulse: (!) 105  Resp: 18  Temp: 98.3 F (36.8 C)  TempSrc: Oral  Weight: 228 lb (103.4 kg)   Physical Exam HENT:     Head: Normocephalic and atraumatic.  Eyes:     Pupils: Pupils are equal, round, and reactive to light.  Neck:     Musculoskeletal: Normal range of motion.  Cardiovascular:     Rate and Rhythm: Regular rhythm. Tachycardia present.     Heart sounds: Normal heart sounds.  Pulmonary:     Effort: Pulmonary effort is normal.     Breath sounds: Normal  breath sounds.  Abdominal:     General: Bowel sounds are normal.     Palpations: Abdomen is soft.  Skin:    General: Skin is warm and dry.  Neurological:     Mental Status: She is alert and oriented to person, place, and time.      CMP Latest Ref Rng & Units 04/25/2018  Glucose 70 - 99 mg/dL 92  BUN 6 - 20 mg/dL 16  Creatinine 0.44 - 1.00 mg/dL 0.55  Sodium 135 - 145 mmol/L 135  Potassium 3.5 - 5.1 mmol/L 3.4(L)  Chloride 98 - 111 mmol/L 99  CO2 22 - 32 mmol/L 24  Calcium 8.9 - 10.3 mg/dL 8.8(L)  Total Protein 6.5 - 8.1 g/dL 8.1  Total Bilirubin 0.3 - 1.2 mg/dL 0.7  Alkaline Phos 38 - 126 U/L 116  AST 15 - 41 U/L 19  ALT 0 - 44 U/L 29   CBC Latest Ref Rng & Units 04/25/2018  WBC 4.0 - 10.5 K/uL 7.4  Hemoglobin 12.0 - 15.0 g/dL 10.5(L)  Hematocrit 36.0 - 46.0 % 34.0(L)  Platelets 150 - 400 K/uL 415(H)    No images are attached to the encounter.  Nm Pet Image Initial (pi) Skull Base To Thigh  Result Date: 03/28/2018 CLINICAL DATA:  Initial treatment strategy for cervical adenocarcinoma. EXAM: NUCLEAR MEDICINE PET SKULL BASE TO THIGH TECHNIQUE: 12.6 mCi F-18 FDG was injected intravenously. Full-ring PET imaging was performed from the skull base to thigh after the radiotracer. CT data was obtained and used for attenuation correction and anatomic localization. Fasting blood glucose: 95 mg/dl COMPARISON:  None. FINDINGS: (Background mediastinal blood pool activity: SUV max = 3.8) NECK: No hypermetabolic lymph nodes. Mild goiter is seen with diffuse hypermetabolic activity throughout the thyroid gland, consistent with thyroiditis. Incidental CT findings:  None. CHEST: No hypermetabolic masses or lymphadenopathy. No suspicious pulmonary nodules seen on CT images. Incidental CT findings:  None. ABDOMEN/PELVIS: No abnormal hypermetabolic activity within the liver, pancreas, adrenal glands, or spleen. No hypermetabolic lymph nodes in the abdomen. No evidence of hydroureteronephrosis.  Hypermetabolic soft tissue mass in the cervix measures 6.3 x 6 2 cm, within SUV of 17.8. Bilateral hypermetabolic parametrial lymph nodes are seen, measuring 1.3 cm on  the left with SUV max of 5.4, and measuring 1.9 cm on the right with SUV max of 11.3. An 8 mm right perirectal lymph node and sub-cm left internal iliac lymph node also show mild hypermetabolic activity. Mild hypermetabolic bilateral external iliac lymphadenopathy is seen, largest on the left measuring 11 mm with SUV max of 3.9, and on the right measuring 1.3 cm with SUV max of 6.6. Incidental CT findings:  None. SKELETON:  No hypermetabolic bone lesions identified. Incidental CT findings:  None. IMPRESSION: 6 cm hypermetabolic cervical mass, consistent with primary cervical carcinoma. Mild hypermetabolic bilateral parametrial, right perirectal, and bilateral iliac lymph nodes, consistent with metastatic disease. No evidence of metastatic disease within the abdomen, chest, or neck. Diffuse thyroiditis incidentally noted. Recommend correlation with thyroid function tests. Electronically Signed   By: Earle Gell M.D.   On: 03/28/2018 14:33     Assessment and plan- Patient is a 33 y.o. female withadenocarcinoma of the cervix FIGO stage III C1r T2N1M0with metastases to the external iliac(pelvic lymph nodes).    She is here for on treatment assessment prior to cycle 3 of weekly cisplatin chemotherapy  Patient received cycle 2 of cisplatin 1 day late because she could not have enough urine output despite receiving extra IV fluids.  Her counts are okay to proceed with cycle 3 of weekly cisplatin chemotherapy on 04/27/2018.  We will also schedule her for fluids and possible antiemetics on 04/29/2018.  I will see her back in 1 week's time with CBC and CMP for cycle 4 of weekly cisplatin.  Patient has a history of substance abuse and was on methadone for which she is currently not able to afford and has been getting it through her husband.  She has  been complaining of intermittent abdominal pain and has been mainly receiving IV Toradol during her infusion.  We will refill her Zofran today   Visit Diagnosis 1. Encounter for antineoplastic chemotherapy   2. 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[place])   3. Chemotherapy induced nausea and vomiting      Dr. Randa Evens, MD, MPH [facility] at [facility] [phone] 04/26/2018 8:27 AM

## 2018-04-27 ENCOUNTER — Other Ambulatory Visit: Payer: Self-pay

## 2018-04-27 ENCOUNTER — Inpatient Hospital Stay: Payer: Medicaid Other

## 2018-04-27 ENCOUNTER — Ambulatory Visit
Admission: RE | Admit: 2018-04-27 | Discharge: 2018-04-27 | Disposition: A | Discharge status: Home / Self Care | Payer: Medicaid Other | Source: Ambulatory Visit | Attending: Radiation Oncology | Admitting: Radiation Oncology

## 2018-04-27 VITALS — BP 137/95 | HR 99 | Temp 99.2°F | Resp 18

## 2018-04-27 DIAGNOSIS — C539 Malignant neoplasm of cervix uteri, unspecified: Secondary | ICD-10-CM

## 2018-04-27 DIAGNOSIS — Z510 Encounter for antineoplastic radiation therapy: Secondary | ICD-10-CM | POA: Diagnosis not present

## 2018-04-27 DIAGNOSIS — D500 Iron deficiency anemia secondary to blood loss (chronic): Secondary | ICD-10-CM

## 2018-04-27 DIAGNOSIS — R110 Nausea: Secondary | ICD-10-CM

## 2018-04-27 DIAGNOSIS — Z5111 Encounter for antineoplastic chemotherapy: Secondary | ICD-10-CM | POA: Diagnosis not present

## 2018-04-27 MED ORDER — SODIUM CHLORIDE 0.9 % IV SOLN
40.0000 mg/m2 | Freq: Once | INTRAVENOUS | Status: AC
  Administered 2018-04-27: 90 mg via INTRAVENOUS
  Filled 2018-04-27: qty 90

## 2018-04-27 MED ORDER — SODIUM CHLORIDE 0.9% FLUSH
10.0000 mL | INTRAVENOUS | Status: DC | PRN
  Filled 2018-04-27: qty 10

## 2018-04-27 MED ORDER — SODIUM CHLORIDE 0.9 % IV SOLN
Freq: Once | INTRAVENOUS | Status: AC
  Administered 2018-04-27: 09:00:00 via INTRAVENOUS
  Filled 2018-04-27: qty 250

## 2018-04-27 MED ORDER — SODIUM CHLORIDE 0.9 % IV SOLN
Freq: Once | INTRAVENOUS | Status: AC
  Administered 2018-04-27: 12:00:00 via INTRAVENOUS
  Filled 2018-04-27: qty 5

## 2018-04-27 MED ORDER — SODIUM CHLORIDE 0.9 % IV SOLN
510.0000 mg | Freq: Once | INTRAVENOUS | Status: AC
  Administered 2018-04-27: 510 mg via INTRAVENOUS
  Filled 2018-04-27: qty 17

## 2018-04-27 MED ORDER — PALONOSETRON HCL INJECTION 0.25 MG/5ML
0.2500 mg | Freq: Once | INTRAVENOUS | Status: AC
  Administered 2018-04-27: 0.25 mg via INTRAVENOUS
  Filled 2018-04-27: qty 5

## 2018-04-27 MED ORDER — HEPARIN SOD (PORK) LOCK FLUSH 100 UNIT/ML IV SOLN
500.0000 [IU] | Freq: Once | INTRAVENOUS | Status: AC | PRN
  Administered 2018-04-27: 500 [IU]
  Filled 2018-04-27: qty 5

## 2018-04-27 MED ORDER — POTASSIUM CHLORIDE 2 MEQ/ML IV SOLN
Freq: Once | INTRAVENOUS | Status: AC
  Administered 2018-04-27: 10:00:00 via INTRAVENOUS
  Filled 2018-04-27: qty 1000

## 2018-04-27 NOTE — Progress Notes (Signed)
Kristy Gordon was presented at MDT board today. Recommendation to continue current regimen followed by brachytherapy at [facility].

## 2018-04-28 ENCOUNTER — Ambulatory Visit
Admission: RE | Admit: 2018-04-28 | Discharge: 2018-04-28 | Disposition: A | Discharge status: Home / Self Care | Payer: Medicaid Other | Source: Ambulatory Visit | Attending: Radiation Oncology | Admitting: Radiation Oncology

## 2018-04-28 DIAGNOSIS — Z510 Encounter for antineoplastic radiation therapy: Secondary | ICD-10-CM | POA: Diagnosis not present

## 2018-04-29 ENCOUNTER — Other Ambulatory Visit: Payer: Self-pay | Admitting: Oncology

## 2018-04-29 ENCOUNTER — Ambulatory Visit
Admission: RE | Admit: 2018-04-29 | Discharge: 2018-04-29 | Disposition: A | Discharge status: Home / Self Care | Payer: Medicaid Other | Source: Ambulatory Visit | Attending: Radiation Oncology | Admitting: Radiation Oncology

## 2018-04-29 ENCOUNTER — Inpatient Hospital Stay: Payer: Medicaid Other

## 2018-04-29 VITALS — BP 127/90 | HR 122 | Temp 98.2°F | Resp 18

## 2018-04-29 DIAGNOSIS — C539 Malignant neoplasm of cervix uteri, unspecified: Secondary | ICD-10-CM

## 2018-04-29 DIAGNOSIS — Z510 Encounter for antineoplastic radiation therapy: Secondary | ICD-10-CM | POA: Diagnosis not present

## 2018-04-29 DIAGNOSIS — Z5111 Encounter for antineoplastic chemotherapy: Secondary | ICD-10-CM | POA: Diagnosis not present

## 2018-04-29 DIAGNOSIS — R110 Nausea: Secondary | ICD-10-CM

## 2018-04-29 DIAGNOSIS — D500 Iron deficiency anemia secondary to blood loss (chronic): Secondary | ICD-10-CM

## 2018-04-29 MED ORDER — PROCHLORPERAZINE EDISYLATE 10 MG/2ML IJ SOLN
10.0000 mg | Freq: Once | INTRAMUSCULAR | Status: AC
  Administered 2018-04-29: 10 mg via INTRAVENOUS
  Filled 2018-04-29: qty 2

## 2018-04-29 MED ORDER — SODIUM CHLORIDE 0.9 % IV SOLN
Freq: Once | INTRAVENOUS | Status: AC
  Administered 2018-04-29: 11:00:00 via INTRAVENOUS
  Filled 2018-04-29: qty 250

## 2018-05-02 ENCOUNTER — Ambulatory Visit
Admission: RE | Admit: 2018-05-02 | Discharge: 2018-05-02 | Disposition: A | Discharge status: Home / Self Care | Payer: Medicaid Other | Source: Ambulatory Visit | Attending: Radiation Oncology | Admitting: Radiation Oncology

## 2018-05-02 ENCOUNTER — Inpatient Hospital Stay: Payer: Medicaid Other

## 2018-05-02 ENCOUNTER — Encounter: Payer: Self-pay | Admitting: Oncology

## 2018-05-02 ENCOUNTER — Other Ambulatory Visit: Payer: Self-pay

## 2018-05-02 ENCOUNTER — Inpatient Hospital Stay (HOSPITAL_BASED_OUTPATIENT_CLINIC_OR_DEPARTMENT_OTHER): Payer: Medicaid Other | Admitting: Oncology

## 2018-05-02 VITALS — BP 140/107 | HR 120 | Temp 98.1°F | Resp 12 | Ht 67.0 in | Wt 227.1 lb

## 2018-05-02 DIAGNOSIS — C539 Malignant neoplasm of cervix uteri, unspecified: Secondary | ICD-10-CM

## 2018-05-02 DIAGNOSIS — F419 Anxiety disorder, unspecified: Secondary | ICD-10-CM

## 2018-05-02 DIAGNOSIS — T451X5A Adverse effect of antineoplastic and immunosuppressive drugs, initial encounter: Secondary | ICD-10-CM

## 2018-05-02 DIAGNOSIS — C778 Secondary and unspecified malignant neoplasm of lymph nodes of multiple regions: Secondary | ICD-10-CM

## 2018-05-02 DIAGNOSIS — D500 Iron deficiency anemia secondary to blood loss (chronic): Secondary | ICD-10-CM

## 2018-05-02 DIAGNOSIS — Z5111 Encounter for antineoplastic chemotherapy: Secondary | ICD-10-CM

## 2018-05-02 DIAGNOSIS — Z720 Tobacco use: Secondary | ICD-10-CM

## 2018-05-02 DIAGNOSIS — Z510 Encounter for antineoplastic radiation therapy: Secondary | ICD-10-CM | POA: Diagnosis not present

## 2018-05-02 DIAGNOSIS — R112 Nausea with vomiting, unspecified: Secondary | ICD-10-CM

## 2018-05-02 DIAGNOSIS — R109 Unspecified abdominal pain: Secondary | ICD-10-CM

## 2018-05-02 LAB — CBC WITH DIFFERENTIAL/PLATELET
Abs Immature Granulocytes: 0.04 10*3/uL (ref 0.00–0.07)
Basophils Absolute: 0 10*3/uL (ref 0.0–0.1)
Basophils Relative: 0 %
Eosinophils Absolute: 0.1 10*3/uL (ref 0.0–0.5)
Eosinophils Relative: 1 %
HCT: 35 % — ABNORMAL LOW (ref 36.0–46.0)
Hemoglobin: 11.1 g/dL — ABNORMAL LOW (ref 12.0–15.0)
IMMATURE GRANULOCYTES: 1 %
LYMPHS ABS: 1.3 10*3/uL (ref 0.7–4.0)
LYMPHS PCT: 18 %
MCH: 25.1 pg — ABNORMAL LOW (ref 26.0–34.0)
MCHC: 31.7 g/dL (ref 30.0–36.0)
MCV: 79.2 fL — ABNORMAL LOW (ref 80.0–100.0)
Monocytes Absolute: 0.7 10*3/uL (ref 0.1–1.0)
Monocytes Relative: 9 %
Neutro Abs: 5.3 10*3/uL (ref 1.7–7.7)
Neutrophils Relative %: 71 %
Platelets: 255 10*3/uL (ref 150–400)
RBC: 4.42 MIL/uL (ref 3.87–5.11)
RDW: 18.9 % — ABNORMAL HIGH (ref 11.5–15.5)
WBC: 7.4 10*3/uL (ref 4.0–10.5)
nRBC: 0 % (ref 0.0–0.2)

## 2018-05-02 LAB — COMPREHENSIVE METABOLIC PANEL
ALK PHOS: 96 U/L (ref 38–126)
ALT: 24 U/L (ref 0–44)
AST: 18 U/L (ref 15–41)
Albumin: 3.3 g/dL — ABNORMAL LOW (ref 3.5–5.0)
Anion gap: 12 (ref 5–15)
BUN: 18 mg/dL (ref 6–20)
CO2: 24 mmol/L (ref 22–32)
Calcium: 9 mg/dL (ref 8.9–10.3)
Chloride: 99 mmol/L (ref 98–111)
Creatinine, Ser: 0.67 mg/dL (ref 0.44–1.00)
GFR calc Af Amer: >60 mL/min (ref >60)
GFR calc non Af Amer: >60 mL/min (ref >60)
Glucose, Bld: 130 mg/dL — ABNORMAL HIGH (ref 70–99)
Potassium: 3.5 mmol/L (ref 3.5–5.1)
Sodium: 135 mmol/L (ref 135–145)
Total Bilirubin: 0.4 mg/dL (ref 0.3–1.2)
Total Protein: 7.6 g/dL (ref 6.5–8.1)

## 2018-05-02 NOTE — Progress Notes (Signed)
Patient here for follow up. She reports feeling exhausted.

## 2018-05-03 ENCOUNTER — Ambulatory Visit
Admission: RE | Admit: 2018-05-03 | Discharge: 2018-05-03 | Disposition: A | Discharge status: Home / Self Care | Payer: Medicaid Other | Source: Ambulatory Visit | Attending: Radiation Oncology | Admitting: Radiation Oncology

## 2018-05-03 ENCOUNTER — Other Ambulatory Visit: Payer: Self-pay

## 2018-05-03 ENCOUNTER — Ambulatory Visit: Payer: Self-pay

## 2018-05-03 DIAGNOSIS — Z510 Encounter for antineoplastic radiation therapy: Secondary | ICD-10-CM | POA: Diagnosis not present

## 2018-05-04 ENCOUNTER — Ambulatory Visit
Admission: RE | Admit: 2018-05-04 | Discharge: 2018-05-04 | Disposition: A | Discharge status: Home / Self Care | Payer: Medicaid Other | Source: Ambulatory Visit | Attending: Radiation Oncology | Admitting: Radiation Oncology

## 2018-05-04 ENCOUNTER — Inpatient Hospital Stay: Payer: Medicaid Other

## 2018-05-04 ENCOUNTER — Telehealth: Payer: Self-pay | Admitting: *Deleted

## 2018-05-04 VITALS — BP 119/76 | HR 97 | Resp 20 | Wt 226.1 lb

## 2018-05-04 DIAGNOSIS — Z5111 Encounter for antineoplastic chemotherapy: Secondary | ICD-10-CM | POA: Diagnosis not present

## 2018-05-04 DIAGNOSIS — R109 Unspecified abdominal pain: Secondary | ICD-10-CM

## 2018-05-04 DIAGNOSIS — C539 Malignant neoplasm of cervix uteri, unspecified: Secondary | ICD-10-CM

## 2018-05-04 DIAGNOSIS — Z510 Encounter for antineoplastic radiation therapy: Secondary | ICD-10-CM | POA: Diagnosis not present

## 2018-05-04 MED ORDER — POTASSIUM CHLORIDE 2 MEQ/ML IV SOLN
Freq: Once | INTRAVENOUS | Status: AC
  Administered 2018-05-04: 10:00:00 via INTRAVENOUS
  Filled 2018-05-04: qty 10

## 2018-05-04 MED ORDER — LORAZEPAM 2 MG/ML IJ SOLN
INTRAMUSCULAR | Status: AC
  Filled 2018-05-04: qty 1

## 2018-05-04 MED ORDER — LORAZEPAM 2 MG/ML IJ SOLN
0.5000 mg | Freq: Once | INTRAMUSCULAR | Status: AC
  Administered 2018-05-04: 0.5 mg via INTRAVENOUS

## 2018-05-04 MED ORDER — SODIUM CHLORIDE 0.9 % IV SOLN
Freq: Once | INTRAVENOUS | Status: AC
  Administered 2018-05-04: 14:00:00 via INTRAVENOUS
  Filled 2018-05-04: qty 5

## 2018-05-04 MED ORDER — HEPARIN SOD (PORK) LOCK FLUSH 100 UNIT/ML IV SOLN
500.0000 [IU] | Freq: Once | INTRAVENOUS | Status: AC | PRN
  Administered 2018-05-04: 500 [IU]

## 2018-05-04 MED ORDER — KETOROLAC TROMETHAMINE 15 MG/ML IJ SOLN
15.0000 mg | Freq: Once | INTRAMUSCULAR | Status: AC
  Administered 2018-05-04: 15 mg via INTRAVENOUS

## 2018-05-04 MED ORDER — SODIUM CHLORIDE 0.9 % IV SOLN
Freq: Once | INTRAVENOUS | Status: AC
  Administered 2018-05-04: 10:00:00 via INTRAVENOUS
  Filled 2018-05-04: qty 250

## 2018-05-04 MED ORDER — SODIUM CHLORIDE 0.9 % IV SOLN
40.0000 mg/m2 | Freq: Once | INTRAVENOUS | Status: AC
  Administered 2018-05-04: 90 mg via INTRAVENOUS
  Filled 2018-05-04: qty 90

## 2018-05-04 MED ORDER — PALONOSETRON HCL INJECTION 0.25 MG/5ML
0.2500 mg | Freq: Once | INTRAVENOUS | Status: AC
  Administered 2018-05-04: 0.25 mg via INTRAVENOUS

## 2018-05-04 NOTE — Progress Notes (Signed)
HR 132.  MD to give ativan and proceed with treatment

## 2018-05-05 ENCOUNTER — Other Ambulatory Visit: Payer: Self-pay | Admitting: Nurse Practitioner

## 2018-05-05 ENCOUNTER — Ambulatory Visit
Admission: RE | Admit: 2018-05-05 | Discharge: 2018-05-05 | Disposition: A | Discharge status: Home / Self Care | Payer: Medicaid Other | Source: Ambulatory Visit | Attending: Radiation Oncology | Admitting: Radiation Oncology

## 2018-05-05 DIAGNOSIS — T451X5A Adverse effect of antineoplastic and immunosuppressive drugs, initial encounter: Principal | ICD-10-CM

## 2018-05-05 DIAGNOSIS — R110 Nausea: Secondary | ICD-10-CM

## 2018-05-05 DIAGNOSIS — Z510 Encounter for antineoplastic radiation therapy: Secondary | ICD-10-CM | POA: Diagnosis not present

## 2018-05-05 NOTE — Progress Notes (Signed)
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 Positive pelvic LN   Chief complaint/ Reason for visit-on treatment assessment prior to cycle 4 of weekly cisplatin chemotherapy  Heme/Onc history: patient is a 33 year old female G71 who initially presented to the ER on 03/15/2018 with symptoms of abdominal pain and cramping. She has also been having irregular menstrual bleeding and spotting on and off for the last few months.She had an ultrasound pelvis done in the ER which showed a large 5.8 cm hypoechoic vascular mass within the cervix and the lower uterine segment. Patient had last seen GYN in 2013 and had not had a Pap smear done since then. Patient was seen by GYN Dr. Nechama Guard on 1213 and was found to have firm friable cervix and multiple biopsies were taken which showed adenocarcinoma. She was then seen by Dr. Fransisca Connors from GYN oncology. Pelvic exam showed anteverted cervix that was replaced by tumor and extension of cancer into the left parametrium and possibly to the pelvic sidewall.  PET CT scan on 03/28/2018 showed 6 cm hypermetabolic cervical mass consistent with primary cervical carcinoma. Mild hypermetabolic bilateral parametrial right perirectal bilateral iliac lymph nodes consistent with metastatic disease. No evidence of metastatic disease within the abdomen chest or neck.  Cycle 1 of weekly cisplatin and radiation started on 04/11/2018   Interval history-overall she is tolerating chemotherapy well.  She does require fluids and antiemetics 3 to 4 days after receiving chemotherapy and is overall controlling her symptoms of nausea and  vomiting well.  She has intermittent abdominal pain which is self-limited and she has required tramadol in the past during her infusions.  ECOG PS- 1 Pain scale- 0 Opioid associated constipation- no  Review of systems- Review of Systems  Constitutional: Positive for malaise/fatigue. Negative for chills, fever and weight loss.  HENT: Negative for congestion, ear discharge and nosebleeds.   Eyes: Negative for blurred vision.  Respiratory: Negative for cough, hemoptysis, sputum production, shortness of breath and wheezing.   Cardiovascular: Negative for chest pain, palpitations, orthopnea and claudication.  Gastrointestinal: Negative for abdominal pain, blood in stool, constipation, diarrhea, heartburn, melena, nausea and vomiting.  Genitourinary: Negative for dysuria, flank pain, frequency, hematuria and urgency.  Musculoskeletal: Negative for back pain, joint pain and myalgias.  Skin: Negative for rash.  Neurological: Negative for dizziness, tingling, focal weakness, seizures, weakness and headaches.  Endo/Heme/Allergies: Does not bruise/bleed easily.  Psychiatric/Behavioral: Negative for depression and suicidal ideas. The patient does not have insomnia.      Allergies  Allergen Reactions  . Amoxicillin Rash  . Penicillins Hives     Past Medical History:  Diagnosis Date  . Anxiety   . Cancer (HCC)    cervical cancer  . Hashimoto's thyroiditis   . Hypothyroid      Past Surgical History:  Procedure Laterality Date  . BUNIONECTOMY Right   . PORTA CATH INSERTION N/A 03/31/2018   Procedure: PORTA CATH INSERTION;  Surgeon: Algernon Huxley, MD;  Location: [place] [facility];  Service: Cardiovascular;  Laterality: N/A;  . TONSILLECTOMY      Social History   Socioeconomic History  . Marital status: Married    Spouse name:  Not on file  . Number of children: Not on file  . Years of education: Not on file  . Highest education level: Not on file  Occupational History  . Not on  file  Social Needs  . Financial resource strain: Not on file  . Food insecurity:    Worry: Not on file    Inability: Not on file  . Transportation needs:    Medical: Not on file    Non-medical: Not on file  Tobacco Use  . Smoking status: Current Every Day Smoker    Packs/day: 1.00    Years: 10.00    Pack years: 10.00    Types: Cigarettes  . Smokeless tobacco: Never Used  Substance and Sexual Activity  . Alcohol use: Yes    Comment: rare  . Drug use: Yes    Types: Marijuana    Comment: quit opiates 5 years ago  . Sexual activity: Yes    Birth control/protection: None  Lifestyle  . Physical activity:    Days per week: Not on file    Minutes per session: Not on file  . Stress: Not on file  Relationships  . Social connections:    Talks on phone: Not on file    Gets together: Not on file    Attends religious service: Not on file    Active member of club or organization: Not on file    Attends meetings of clubs or organizations: Not on file    Relationship status: Not on file  . Intimate partner violence:    Fear of current or ex partner: Not on file    Emotionally abused: Not on file    Physically abused: Not on file    Forced sexual activity: Not on file  Other Topics Concern  . Not on file  Social History Narrative  . Not on file    Family History  Adopted: Yes  Family history unknown: Yes     Current Outpatient Medications:  .  acetaminophen (TYLENOL) 500 MG tablet, Take 500 mg by mouth every 4 (four) hours as needed., Disp: , Rfl:  .  ALPRAZolam (XANAX) 1 MG tablet, TAKE 1 TABLET BY MOUTH THREE TIMES DAILY AS NEEDED FOR ANXIETY, Disp: 90 tablet, Rfl: 0 .  citalopram (CELEXA) 40 MG tablet, Take 1 tablet (40 mg total) by mouth daily., Disp: , Rfl:  .  dexamethasone (DECADRON) 4 MG tablet, Take 2 tablets by mouth once a day on the day after chemotherapy and then take 2 tablets two times a day for 2 days. Take with food., Disp: 30 tablet, Rfl: 1 .  levothyroxine  (SYNTHROID, LEVOTHROID) 112 MCG tablet, Take 2 tablets (224 mcg total) by mouth daily., Disp: 60 tablet, Rfl: 5 .  lidocaine-prilocaine (EMLA) cream, Apply to affected area once, Disp: 30 g, Rfl: 3 .  methadone (DOLOPHINE) 5 MG tablet, Take 15 mg by mouth daily. Once daily, Disp: , Rfl:  .  ondansetron (ZOFRAN-ODT) 4 MG disintegrating tablet, Take 1 tablet (4 mg total) by mouth every 6 (six) hours as needed for nausea or vomiting., Disp: 30 tablet, Rfl: 0 .  pantoprazole (PROTONIX) 20 MG tablet, Take 1 tablet (20 mg total) by mouth daily., Disp: 30 tablet, Rfl: 3 .  potassium chloride SA (K-DUR,KLOR-CON) 20 MEQ tablet, Take 1 tablet (20 mEq total) by mouth daily., Disp: 7 tablet, Rfl: 0 .  prochlorperazine (COMPAZINE) 10 MG tablet, Take 1 tablet (10 mg total) by mouth every 6 (six) hours as needed (  Nausea or vomiting)., Disp: 30 tablet, Rfl: 1 .  promethazine (PHENERGAN) 25 MG tablet, Take 0.5-1 tablets (12.5-25 mg total) by mouth every 12 (twelve) hours as needed for vomiting or refractory nausea / vomiting., Disp: 30 tablet, Rfl: 0 No current facility-administered medications for this visit.   Facility-Administered Medications Ordered in Other Visits:  .  0.9 %  sodium chloride infusion, , Intravenous, Continuous, Sindy Guadeloupe, MD, Stopped at 04/15/18 1001 .  sodium chloride flush (NS) 0.9 % injection 10 mL, 10 mL, Intravenous, PRN, Sindy Guadeloupe, MD, 10 mL at 04/15/18 0825 .  sodium chloride flush (NS) 0.9 % injection 10 mL, 10 mL, Intracatheter, PRN, Sindy Guadeloupe, MD  Physical exam:  Vitals:   05/02/18 1306 05/02/18 1311  BP:  (!) 140/107  Pulse:  (!) 120  Resp: 12   Temp:  98.1 F (36.7 C)  TempSrc:  Tympanic  Weight: 227 lb 1.6 oz (103 kg)   Height: 5\' 7"  (1.702 m)    Physical Exam Constitutional:      General: She is not in acute distress. HENT:     Head: Normocephalic and atraumatic.  Eyes:     Pupils: Pupils are equal, round, and reactive to light.  Neck:      Musculoskeletal: Normal range of motion.  Cardiovascular:     Rate and Rhythm: Regular rhythm. Tachycardia present.     Heart sounds: Normal heart sounds.  Pulmonary:     Effort: Pulmonary effort is normal.     Breath sounds: Normal breath sounds.  Abdominal:     General: Bowel sounds are normal.     Palpations: Abdomen is soft.  Skin:    General: Skin is warm and dry.  Neurological:     Mental Status: She is alert and oriented to person, place, and time.      CMP Latest Ref Rng & Units 05/02/2018  Glucose 70 - 99 mg/dL 130(H)  BUN 6 - 20 mg/dL 18  Creatinine 0.44 - 1.00 mg/dL 0.67  Sodium 135 - 145 mmol/L 135  Potassium 3.5 - 5.1 mmol/L 3.5  Chloride 98 - 111 mmol/L 99  CO2 22 - 32 mmol/L 24  Calcium 8.9 - 10.3 mg/dL 9.0  Total Protein 6.5 - 8.1 g/dL 7.6  Total Bilirubin 0.3 - 1.2 mg/dL 0.4  Alkaline Phos 38 - 126 U/L 96  AST 15 - 41 U/L 18  ALT 0 - 44 U/L 24   CBC Latest Ref Rng & Units 05/02/2018  WBC 4.0 - 10.5 K/uL 7.4  Hemoglobin 12.0 - 15.0 g/dL 11.1(L)  Hematocrit 36.0 - 46.0 % 35.0(L)  Platelets 150 - 400 K/uL 255      Assessment and plan- Patient is a 33 y.o. female  withadenocarcinoma of the cervix FIGO stage III C1r T2N1M0with metastases to the external iliac(pelvic lymph nodes).    She is here for on treatment assessment prior to cycle 4 of weekly cisplatin chemotherapy  Counts are okay to proceed with cycle 4 of chemotherapy on 05/04/2018.  We will also schedule her for tentative IV fluids and IV nausea medications on 05/06/2018.  I will see her back in 1 week's time with CBC CMP for cycle 5 of chemotherapy next week.  She completes radiation treatment 2 weeks from now and will likely receive 6 weekly cycles of cisplatin based on her tolerance and labs   Visit Diagnosis 1. Encounter for antineoplastic chemotherapy   2. Anxiety   3. Chemotherapy induced nausea and vomiting  Dr. Randa Evens, MD, MPH [facility] [phone] 05/05/2018 9:48 AM

## 2018-05-06 ENCOUNTER — Inpatient Hospital Stay: Payer: Medicaid Other

## 2018-05-06 ENCOUNTER — Ambulatory Visit
Admission: RE | Admit: 2018-05-06 | Discharge: 2018-05-06 | Disposition: A | Discharge status: Home / Self Care | Payer: Medicaid Other | Source: Ambulatory Visit | Attending: Radiation Oncology | Admitting: Radiation Oncology

## 2018-05-06 DIAGNOSIS — Z510 Encounter for antineoplastic radiation therapy: Secondary | ICD-10-CM | POA: Diagnosis not present

## 2018-05-07 DIAGNOSIS — G629 Polyneuropathy, unspecified: Secondary | ICD-10-CM | POA: Insufficient documentation

## 2018-05-09 ENCOUNTER — Inpatient Hospital Stay: Payer: Medicaid Other | Attending: Oncology | Admitting: Oncology

## 2018-05-09 ENCOUNTER — Ambulatory Visit
Admission: RE | Admit: 2018-05-09 | Discharge: 2018-05-09 | Disposition: A | Discharge status: Home / Self Care | Payer: Medicaid Other | Source: Ambulatory Visit | Attending: Radiation Oncology | Admitting: Radiation Oncology

## 2018-05-09 ENCOUNTER — Inpatient Hospital Stay: Payer: Medicaid Other

## 2018-05-09 VITALS — BP 133/88 | HR 110 | Temp 97.5°F | Resp 18 | Wt 231.1 lb

## 2018-05-09 DIAGNOSIS — D72819 Decreased white blood cell count, unspecified: Secondary | ICD-10-CM | POA: Insufficient documentation

## 2018-05-09 DIAGNOSIS — N938 Other specified abnormal uterine and vaginal bleeding: Secondary | ICD-10-CM | POA: Insufficient documentation

## 2018-05-09 DIAGNOSIS — G4700 Insomnia, unspecified: Secondary | ICD-10-CM | POA: Insufficient documentation

## 2018-05-09 DIAGNOSIS — C539 Malignant neoplasm of cervix uteri, unspecified: Secondary | ICD-10-CM | POA: Insufficient documentation

## 2018-05-09 DIAGNOSIS — R109 Unspecified abdominal pain: Secondary | ICD-10-CM | POA: Insufficient documentation

## 2018-05-09 DIAGNOSIS — D500 Iron deficiency anemia secondary to blood loss (chronic): Secondary | ICD-10-CM

## 2018-05-09 DIAGNOSIS — F319 Bipolar disorder, unspecified: Secondary | ICD-10-CM | POA: Diagnosis not present

## 2018-05-09 DIAGNOSIS — F419 Anxiety disorder, unspecified: Secondary | ICD-10-CM | POA: Insufficient documentation

## 2018-05-09 DIAGNOSIS — Z510 Encounter for antineoplastic radiation therapy: Secondary | ICD-10-CM | POA: Insufficient documentation

## 2018-05-09 DIAGNOSIS — G893 Neoplasm related pain (acute) (chronic): Secondary | ICD-10-CM | POA: Diagnosis not present

## 2018-05-09 DIAGNOSIS — R112 Nausea with vomiting, unspecified: Secondary | ICD-10-CM | POA: Insufficient documentation

## 2018-05-09 DIAGNOSIS — D649 Anemia, unspecified: Secondary | ICD-10-CM | POA: Insufficient documentation

## 2018-05-09 DIAGNOSIS — Z5111 Encounter for antineoplastic chemotherapy: Secondary | ICD-10-CM | POA: Diagnosis not present

## 2018-05-09 DIAGNOSIS — Z720 Tobacco use: Secondary | ICD-10-CM

## 2018-05-09 DIAGNOSIS — F17210 Nicotine dependence, cigarettes, uncomplicated: Secondary | ICD-10-CM | POA: Diagnosis not present

## 2018-05-09 DIAGNOSIS — E876 Hypokalemia: Secondary | ICD-10-CM | POA: Insufficient documentation

## 2018-05-09 DIAGNOSIS — Z79899 Other long term (current) drug therapy: Secondary | ICD-10-CM | POA: Diagnosis not present

## 2018-05-09 DIAGNOSIS — D72829 Elevated white blood cell count, unspecified: Secondary | ICD-10-CM | POA: Insufficient documentation

## 2018-05-09 DIAGNOSIS — D473 Essential (hemorrhagic) thrombocythemia: Secondary | ICD-10-CM | POA: Diagnosis not present

## 2018-05-09 DIAGNOSIS — E860 Dehydration: Secondary | ICD-10-CM | POA: Diagnosis not present

## 2018-05-09 DIAGNOSIS — C778 Secondary and unspecified malignant neoplasm of lymph nodes of multiple regions: Secondary | ICD-10-CM

## 2018-05-09 LAB — CBC WITH DIFFERENTIAL/PLATELET
Abs Immature Granulocytes: 0.02 10*3/uL (ref 0.00–0.07)
Basophils Absolute: 0 10*3/uL (ref 0.0–0.1)
Basophils Relative: 0 %
Eosinophils Absolute: 0 10*3/uL (ref 0.0–0.5)
Eosinophils Relative: 0 %
HCT: 34.5 % — ABNORMAL LOW (ref 36.0–46.0)
Hemoglobin: 11.2 g/dL — ABNORMAL LOW (ref 12.0–15.0)
Immature Granulocytes: 0 %
LYMPHS ABS: 0.9 10*3/uL (ref 0.7–4.0)
Lymphocytes Relative: 20 %
MCH: 26 pg (ref 26.0–34.0)
MCHC: 32.5 g/dL (ref 30.0–36.0)
MCV: 80.2 fL (ref 80.0–100.0)
Monocytes Absolute: 0.6 10*3/uL (ref 0.1–1.0)
Monocytes Relative: 12 %
Neutro Abs: 3.1 10*3/uL (ref 1.7–7.7)
Neutrophils Relative %: 68 %
Platelets: 201 10*3/uL (ref 150–400)
RBC: 4.3 MIL/uL (ref 3.87–5.11)
RDW: 21.3 % — ABNORMAL HIGH (ref 11.5–15.5)
WBC: 4.6 10*3/uL (ref 4.0–10.5)
nRBC: 0 % (ref 0.0–0.2)

## 2018-05-09 LAB — COMPREHENSIVE METABOLIC PANEL
ALK PHOS: 80 U/L (ref 38–126)
ALT: 20 U/L (ref 0–44)
AST: 16 U/L (ref 15–41)
Albumin: 3.3 g/dL — ABNORMAL LOW (ref 3.5–5.0)
Anion gap: 9 (ref 5–15)
BUN: 22 mg/dL — ABNORMAL HIGH (ref 6–20)
CO2: 24 mmol/L (ref 22–32)
CREATININE: 0.69 mg/dL (ref 0.44–1.00)
Calcium: 8.5 mg/dL — ABNORMAL LOW (ref 8.9–10.3)
Chloride: 102 mmol/L (ref 98–111)
GFR calc Af Amer: >60 mL/min (ref >60)
GFR calc non Af Amer: >60 mL/min (ref >60)
Glucose, Bld: 79 mg/dL (ref 70–99)
Potassium: 3.6 mmol/L (ref 3.5–5.1)
Sodium: 135 mmol/L (ref 135–145)
Total Bilirubin: 0.4 mg/dL (ref 0.3–1.2)
Total Protein: 7.1 g/dL (ref 6.5–8.1)

## 2018-05-09 NOTE — Progress Notes (Signed)
Pt in for follow up, denies any concerns today. 

## 2018-05-10 ENCOUNTER — Ambulatory Visit
Admission: RE | Admit: 2018-05-10 | Discharge: 2018-05-10 | Disposition: A | Discharge status: Home / Self Care | Payer: Medicaid Other | Source: Ambulatory Visit | Attending: Radiation Oncology | Admitting: Radiation Oncology

## 2018-05-10 ENCOUNTER — Encounter: Payer: Self-pay | Admitting: Oncology

## 2018-05-10 DIAGNOSIS — Z510 Encounter for antineoplastic radiation therapy: Secondary | ICD-10-CM | POA: Diagnosis not present

## 2018-05-10 NOTE — Progress Notes (Signed)
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 Positive pelvic LN  Chief complaint/ Reason for visit-treatment assessment prior to cycle 5 of weekly cisplatin chemotherapy  Heme/Onc history: patient is a 33 year old female G11 who initially presented to the ER on 03/15/2018 with symptoms of abdominal pain and cramping. She has also been having irregular menstrual bleeding and spotting on and off for the last few months.She had an ultrasound pelvis done in the ER which showed a large 5.8 cm hypoechoic vascular mass within the cervix and the lower uterine segment. Patient had last seen GYN in 2013 and had not had a Pap smear done since then. Patient was seen by GYN Dr. Nechama Guard on 1213 and was found to have firm friable cervix and multiple biopsies were taken which showed adenocarcinoma. She was then seen by Dr. Fransisca Connors from GYN oncology. Pelvic exam showed anteverted cervix that was replaced by tumor and extension of cancer into the left parametrium and possibly to the pelvic sidewall.  PET CT scan on 03/28/2018 showed 6 cm hypermetabolic cervical mass consistent with primary cervical carcinoma. Mild hypermetabolic bilateral parametrial right perirectal bilateral iliac lymph nodes consistent with metastatic disease. No evidence of metastatic disease within the abdomen chest or neck.  Cycle 1 of weekly cisplatin and radiation started on 04/11/2018   Interval history-she is doing well on chemotherapy so far.  She did not need to come in for extra IV fluids and anti-medics after receiving chemotherapy.  She has mild intermittent abdominal pain and  occasional spotting.  Denies other complaints.  Her anxiety is under control with Xanax  ECOG PS- 0 Pain scale- 0 Opioid associated constipation- no  Review of systems- Review of Systems  Constitutional: Positive for malaise/fatigue. Negative for chills, fever and weight loss.  HENT: Negative for congestion, ear discharge and nosebleeds.   Eyes: Negative for blurred vision.  Respiratory: Negative for cough, hemoptysis, sputum production, shortness of breath and wheezing.   Cardiovascular: Negative for chest pain, palpitations, orthopnea and claudication.  Gastrointestinal: Negative for abdominal pain, blood in stool, constipation, diarrhea, heartburn, melena, nausea and vomiting.  Genitourinary: Negative for dysuria, flank pain, frequency, hematuria and urgency.  Musculoskeletal: Negative for back pain, joint pain and myalgias.  Skin: Negative for rash.  Neurological: Negative for dizziness, tingling, focal weakness, seizures, weakness and headaches.  Endo/Heme/Allergies: Does not bruise/bleed easily.  Psychiatric/Behavioral: Negative for depression and suicidal ideas. The patient does not have insomnia.        Allergies  Allergen Reactions  . Amoxicillin Rash  . Penicillins Hives     Past Medical History:  Diagnosis Date  . Anxiety   . Cancer (HCC)    cervical cancer  . Hashimoto's thyroiditis   . Hypothyroid      Past Surgical History:  Procedure Laterality Date  . BUNIONECTOMY Right   . PORTA CATH INSERTION N/A 03/31/2018   Procedure: PORTA CATH INSERTION;  Surgeon: Algernon Huxley, MD;  Location: [facility];  Service: Cardiovascular;  Laterality: N/A;  . TONSILLECTOMY      Social History   Socioeconomic History  . Marital status: Married    Spouse name: Not on file  .  Number of children: Not on file  . Years of education: Not on file  . Highest education level: Not on file  Occupational History  . Not on file  Social Needs  . Financial resource  strain: Not on file  . Food insecurity:    Worry: Not on file    Inability: Not on file  . Transportation needs:    Medical: Not on file    Non-medical: Not on file  Tobacco Use  . Smoking status: Current Every Day Smoker    Packs/day: 1.00    Years: 10.00    Pack years: 10.00    Types: Cigarettes  . Smokeless tobacco: Never Used  Substance and Sexual Activity  . Alcohol use: Yes    Comment: rare  . Drug use: Yes    Types: Marijuana    Comment: quit opiates 5 years ago  . Sexual activity: Yes    Birth control/protection: None  Lifestyle  . Physical activity:    Days per week: Not on file    Minutes per session: Not on file  . Stress: Not on file  Relationships  . Social connections:    Talks on phone: Not on file    Gets together: Not on file    Attends religious service: Not on file    Active member of club or organization: Not on file    Attends meetings of clubs or organizations: Not on file    Relationship status: Not on file  . Intimate partner violence:    Fear of current or ex partner: Not on file    Emotionally abused: Not on file    Physically abused: Not on file    Forced sexual activity: Not on file  Other Topics Concern  . Not on file  Social History Narrative  . Not on file    Family History  Adopted: Yes  Family history unknown: Yes     Current Outpatient Medications:  .  acetaminophen (TYLENOL) 500 MG tablet, Take 500 mg by mouth every 4 (four) hours as needed., Disp: , Rfl:  .  ALPRAZolam (XANAX) 1 MG tablet, TAKE 1 TABLET BY MOUTH THREE TIMES DAILY AS NEEDED FOR ANXIETY, Disp: 90 tablet, Rfl: 0 .  citalopram (CELEXA) 40 MG tablet, Take 1 tablet (40 mg total) by mouth daily., Disp: , Rfl:  .  levothyroxine (SYNTHROID, LEVOTHROID) 112 MCG tablet, Take 2 tablets (224 mcg total) by mouth daily., Disp: 60 tablet, Rfl: 5 .  lidocaine-prilocaine (EMLA) cream, Apply to affected area once, Disp: 30 g, Rfl: 3 .  methadone (DOLOPHINE) 5 MG tablet, Take  15 mg by mouth daily. Once daily, Disp: , Rfl:  .  ondansetron (ZOFRAN-ODT) 4 MG disintegrating tablet, Take 1 tablet (4 mg total) by mouth every 6 (six) hours as needed for nausea or vomiting., Disp: 30 tablet, Rfl: 0 .  pantoprazole (PROTONIX) 20 MG tablet, Take 1 tablet (20 mg total) by mouth daily., Disp: 30 tablet, Rfl: 3 .  prochlorperazine (COMPAZINE) 10 MG tablet, Take 1 tablet (10 mg total) by mouth every 6 (six) hours as needed (Nausea or vomiting)., Disp: 30 tablet, Rfl: 1 .  promethazine (PHENERGAN) 25 MG tablet, TAKE 1/2 -1 TABLET BY MOUTH EVERY 12 HOURS AS NEEDED FOR VOMITING OR REFRACTORY NAUSEA/VOMITING, Disp: 30 tablet, Rfl: 0 .  dexamethasone (DECADRON) 4 MG tablet, Take 2 tablets by mouth once a day on the day after chemotherapy and then take 2 tablets two times a day for 2 days.  Take with food. (Patient not taking: Reported on 05/09/2018), Disp: 30 tablet, Rfl: 1 .  potassium chloride SA (K-DUR,KLOR-CON) 20 MEQ tablet, Take 1 tablet (20 mEq total) by mouth daily. (Patient not taking: Reported on 05/09/2018), Disp: 7 tablet, Rfl: 0 No current facility-administered medications for this visit.   Facility-Administered Medications Ordered in Other Visits:  .  0.9 %  sodium chloride infusion, , Intravenous, Continuous, Sindy Guadeloupe, MD, Stopped at 04/15/18 1001 .  sodium chloride flush (NS) 0.9 % injection 10 mL, 10 mL, Intravenous, PRN, Sindy Guadeloupe, MD, 10 mL at 04/15/18 0825 .  sodium chloride flush (NS) 0.9 % injection 10 mL, 10 mL, Intracatheter, PRN, Sindy Guadeloupe, MD  Physical exam:  Vitals:   05/09/18 1057  BP: 133/88  Pulse: (!) 110  Resp: 18  Temp: (!) 97.5 F (36.4 C)  TempSrc: Tympanic  Weight: 231 lb 1 oz (104.8 kg)   Physical Exam Constitutional:      General: She is not in acute distress.    Appearance: She is obese.  HENT:     Head: Normocephalic and atraumatic.  Eyes:     Pupils: Pupils are equal, round, and reactive to light.  Neck:      Musculoskeletal: Normal range of motion.  Cardiovascular:     Rate and Rhythm: Regular rhythm. Tachycardia present.     Heart sounds: Normal heart sounds.  Pulmonary:     Effort: Pulmonary effort is normal.     Breath sounds: Normal breath sounds.  Abdominal:     General: Bowel sounds are normal.     Palpations: Abdomen is soft.  Skin:    General: Skin is warm and dry.  Neurological:     Mental Status: She is alert and oriented to person, place, and time.      CMP Latest Ref Rng & Units 05/09/2018  Glucose 70 - 99 mg/dL 79  BUN 6 - 20 mg/dL 22(H)  Creatinine 0.44 - 1.00 mg/dL 0.69  Sodium 135 - 145 mmol/L 135  Potassium 3.5 - 5.1 mmol/L 3.6  Chloride 98 - 111 mmol/L 102  CO2 22 - 32 mmol/L 24  Calcium 8.9 - 10.3 mg/dL 8.5(L)  Total Protein 6.5 - 8.1 g/dL 7.1  Total Bilirubin 0.3 - 1.2 mg/dL 0.4  Alkaline Phos 38 - 126 U/L 80  AST 15 - 41 U/L 16  ALT 0 - 44 U/L 20   CBC Latest Ref Rng & Units 05/09/2018  WBC 4.0 - 10.5 K/uL 4.6  Hemoglobin 12.0 - 15.0 g/dL 11.2(L)  Hematocrit 36.0 - 46.0 % 34.5(L)  Platelets 150 - 400 K/uL 201      Assessment and plan- Patient is a 33 y.o. female withadenocarcinoma of the cervix FIGO stage III C1r T2N1M0with metastases to the external iliac(pelvic lymph nodes).She is here for on treatment assessment prior to cycle 5 of weekly cisplatin chemotherapy   Overall she is tolerating chemotherapy well well without any significant nausea vomiting.  Her kidney functions have remained stable.  She completes radiation treatment next week I will therefore give her 1 more cycle of chemotherapy next week as well.  Upon completion of radiation treatment she is scheduled to get brachytherapy at [facility] with Dr. Christel Mormon and will be seeing him this week for consultation.  She will proceed with cycle 5 of weekly cisplatin chemotherapy on 05/12/2018. I will see him back in 1 week's time for her last cycle of chemotherapy.  I will also talk to her about  transitioning  out of Xanax as I would not be able to prescribe her long-term Xanax   Visit Diagnosis 1. Encounter for antineoplastic chemotherapy   2. Adenocarcinoma of cervix [facility])      Dr. Randa Evens, MD, MPH [facility] at [facility] [phone] 05/10/2018 11:16 AM

## 2018-05-11 ENCOUNTER — Ambulatory Visit
Admission: RE | Admit: 2018-05-11 | Discharge: 2018-05-11 | Disposition: A | Discharge status: Home / Self Care | Payer: Medicaid Other | Source: Ambulatory Visit | Attending: Radiation Oncology | Admitting: Radiation Oncology

## 2018-05-11 ENCOUNTER — Ambulatory Visit: Payer: Self-pay | Admitting: Family Medicine

## 2018-05-11 DIAGNOSIS — C538 Malignant neoplasm of overlapping sites of cervix uteri: Secondary | ICD-10-CM | POA: Diagnosis not present

## 2018-05-11 DIAGNOSIS — Z510 Encounter for antineoplastic radiation therapy: Secondary | ICD-10-CM | POA: Diagnosis not present

## 2018-05-11 DIAGNOSIS — C539 Malignant neoplasm of cervix uteri, unspecified: Secondary | ICD-10-CM | POA: Insufficient documentation

## 2018-05-11 NOTE — Progress Notes (Deleted)
Patient: Kristy Gordon Female    DOB: 1985-07-12   33 y.o.   MRN: [id number] Visit Date: 05/11/2018  Today's Provider: Wilhemena Durie, MD   No chief complaint on file.  Subjective:    Anxiety  Presents for follow-up visit.      Hypothyroid, follow-up:  TSH  Date Value Ref Range Status  02/08/2018 9.260 (H) 0.450 - 4.500 uIU/mL Final  06/14/2017 22.880 (H) 0.450 - 4.500 uIU/mL Final  09/11/2015 9.980 (H) 0.450 - 4.500 uIU/mL Final   Wt Readings from Last 3 Encounters:  05/09/18 231 lb 1 oz (104.8 kg)  05/04/18 226 lb 2 oz (102.6 kg)  05/02/18 227 lb 1.6 oz (103 kg)    She was last seen for hypothyroid {NUMBERS 1-12:18279} {days/wks/mos/yrs:310907} ago.  Management since that visit includes ***. She reports {excellent/good/fair/poor:19665} compliance with treatment. She {ACTION; IS/IS TKW:40973532} having side effects.  She {is/is not:9024} exercising. She is experiencing {Symptoms; thyroid:13835} She denies {Symptoms; thyroid:13835} Weight trend: {trend:16658}  ------------------------------------------------------------------------    Allergies  Allergen Reactions  . Amoxicillin Rash  . Penicillins Hives     Current Outpatient Medications:  .  acetaminophen (TYLENOL) 500 MG tablet, Take 500 mg by mouth every 4 (four) hours as needed., Disp: , Rfl:  .  ALPRAZolam (XANAX) 1 MG tablet, TAKE 1 TABLET BY MOUTH THREE TIMES DAILY AS NEEDED FOR ANXIETY, Disp: 90 tablet, Rfl: 0 .  citalopram (CELEXA) 40 MG tablet, Take 1 tablet (40 mg total) by mouth daily., Disp: , Rfl:  .  dexamethasone (DECADRON) 4 MG tablet, Take 2 tablets by mouth once a day on the day after chemotherapy and then take 2 tablets two times a day for 2 days. Take with food. (Patient not taking: Reported on 05/09/2018), Disp: 30 tablet, Rfl: 1 .  levothyroxine (SYNTHROID, LEVOTHROID) 112 MCG tablet, Take 2 tablets (224 mcg total) by mouth daily., Disp: 60 tablet, Rfl: 5 .  lidocaine-prilocaine  (EMLA) cream, Apply to affected area once, Disp: 30 g, Rfl: 3 .  methadone (DOLOPHINE) 5 MG tablet, Take 15 mg by mouth daily. Once daily, Disp: , Rfl:  .  ondansetron (ZOFRAN-ODT) 4 MG disintegrating tablet, Take 1 tablet (4 mg total) by mouth every 6 (six) hours as needed for nausea or vomiting., Disp: 30 tablet, Rfl: 0 .  pantoprazole (PROTONIX) 20 MG tablet, Take 1 tablet (20 mg total) by mouth daily., Disp: 30 tablet, Rfl: 3 .  potassium chloride SA (K-DUR,KLOR-CON) 20 MEQ tablet, Take 1 tablet (20 mEq total) by mouth daily. (Patient not taking: Reported on 05/09/2018), Disp: 7 tablet, Rfl: 0 .  prochlorperazine (COMPAZINE) 10 MG tablet, Take 1 tablet (10 mg total) by mouth every 6 (six) hours as needed (Nausea or vomiting)., Disp: 30 tablet, Rfl: 1 .  promethazine (PHENERGAN) 25 MG tablet, TAKE 1/2 -1 TABLET BY MOUTH EVERY 12 HOURS AS NEEDED FOR VOMITING OR REFRACTORY NAUSEA/VOMITING, Disp: 30 tablet, Rfl: 0 No current facility-administered medications for this visit.   Facility-Administered Medications Ordered in Other Visits:  .  0.9 %  sodium chloride infusion, , Intravenous, Continuous, Sindy Guadeloupe, MD, Stopped at 04/15/18 1001 .  sodium chloride flush (NS) 0.9 % injection 10 mL, 10 mL, Intravenous, PRN, Sindy Guadeloupe, MD, 10 mL at 04/15/18 0825 .  sodium chloride flush (NS) 0.9 % injection 10 mL, 10 mL, Intracatheter, PRN, Sindy Guadeloupe, MD  Review of Systems  HENT: Negative.   Respiratory: Negative.   Genitourinary: Negative.  Neurological: Negative.     Social History   Tobacco Use  . Smoking status: Current Every Day Smoker    Packs/day: 1.00    Years: 10.00    Pack years: 10.00    Types: Cigarettes  . Smokeless tobacco: Never Used  Substance Use Topics  . Alcohol use: Yes    Comment: rare      Objective:   There were no vitals taken for this visit. There were no vitals filed for this visit.   Physical Exam      Assessment & Plan        Wilhemena Durie, MD  [facility]

## 2018-05-12 ENCOUNTER — Ambulatory Visit
Admission: RE | Admit: 2018-05-12 | Discharge: 2018-05-12 | Disposition: A | Discharge status: Home / Self Care | Payer: Medicaid Other | Source: Ambulatory Visit | Attending: Radiation Oncology | Admitting: Radiation Oncology

## 2018-05-12 ENCOUNTER — Inpatient Hospital Stay: Payer: Medicaid Other

## 2018-05-12 VITALS — BP 141/82 | HR 93 | Temp 98.7°F | Resp 18 | Wt 231.1 lb

## 2018-05-12 DIAGNOSIS — Z5111 Encounter for antineoplastic chemotherapy: Secondary | ICD-10-CM | POA: Diagnosis not present

## 2018-05-12 DIAGNOSIS — Z510 Encounter for antineoplastic radiation therapy: Secondary | ICD-10-CM | POA: Diagnosis not present

## 2018-05-12 DIAGNOSIS — C539 Malignant neoplasm of cervix uteri, unspecified: Secondary | ICD-10-CM

## 2018-05-12 MED ORDER — SODIUM CHLORIDE 0.9 % IV SOLN
Freq: Once | INTRAVENOUS | Status: AC
  Administered 2018-05-12: 12:00:00 via INTRAVENOUS
  Filled 2018-05-12: qty 5

## 2018-05-12 MED ORDER — POTASSIUM CHLORIDE 2 MEQ/ML IV SOLN
Freq: Once | INTRAVENOUS | Status: AC
  Administered 2018-05-12: 09:00:00 via INTRAVENOUS
  Filled 2018-05-12: qty 1000

## 2018-05-12 MED ORDER — PALONOSETRON HCL INJECTION 0.25 MG/5ML
0.2500 mg | Freq: Once | INTRAVENOUS | Status: AC
  Administered 2018-05-12: 0.25 mg via INTRAVENOUS
  Filled 2018-05-12: qty 5

## 2018-05-12 MED ORDER — SODIUM CHLORIDE 0.9 % IV SOLN
40.0000 mg/m2 | Freq: Once | INTRAVENOUS | Status: AC
  Administered 2018-05-12: 90 mg via INTRAVENOUS
  Filled 2018-05-12: qty 90

## 2018-05-12 MED ORDER — HEPARIN SOD (PORK) LOCK FLUSH 100 UNIT/ML IV SOLN
500.0000 [IU] | Freq: Once | INTRAVENOUS | Status: AC | PRN
  Administered 2018-05-12: 500 [IU]
  Filled 2018-05-12: qty 5

## 2018-05-12 MED ORDER — SODIUM CHLORIDE 0.9 % IV SOLN
Freq: Once | INTRAVENOUS | Status: AC
  Administered 2018-05-12: 09:00:00 via INTRAVENOUS
  Filled 2018-05-12: qty 250

## 2018-05-13 ENCOUNTER — Ambulatory Visit: Payer: Medicaid Other

## 2018-05-13 ENCOUNTER — Ambulatory Visit
Admission: RE | Admit: 2018-05-13 | Discharge: 2018-05-13 | Disposition: A | Discharge status: Home / Self Care | Payer: Medicaid Other | Source: Ambulatory Visit | Attending: Radiation Oncology | Admitting: Radiation Oncology

## 2018-05-13 DIAGNOSIS — Z510 Encounter for antineoplastic radiation therapy: Secondary | ICD-10-CM | POA: Diagnosis not present

## 2018-05-16 ENCOUNTER — Ambulatory Visit
Admission: RE | Admit: 2018-05-16 | Discharge: 2018-05-16 | Disposition: A | Discharge status: Home / Self Care | Payer: Medicaid Other | Source: Ambulatory Visit | Attending: Radiation Oncology | Admitting: Radiation Oncology

## 2018-05-16 ENCOUNTER — Other Ambulatory Visit: Payer: Self-pay | Admitting: Oncology

## 2018-05-16 DIAGNOSIS — Z510 Encounter for antineoplastic radiation therapy: Secondary | ICD-10-CM | POA: Diagnosis not present

## 2018-05-17 ENCOUNTER — Ambulatory Visit: Payer: Medicaid Other

## 2018-05-17 ENCOUNTER — Ambulatory Visit
Admission: RE | Admit: 2018-05-17 | Discharge: 2018-05-17 | Disposition: A | Discharge status: Home / Self Care | Payer: Medicaid Other | Source: Ambulatory Visit | Attending: Radiation Oncology | Admitting: Radiation Oncology

## 2018-05-17 DIAGNOSIS — Z510 Encounter for antineoplastic radiation therapy: Secondary | ICD-10-CM | POA: Diagnosis not present

## 2018-05-18 ENCOUNTER — Ambulatory Visit: Payer: Medicaid Other

## 2018-05-19 ENCOUNTER — Inpatient Hospital Stay (HOSPITAL_BASED_OUTPATIENT_CLINIC_OR_DEPARTMENT_OTHER): Payer: Medicaid Other | Admitting: Oncology

## 2018-05-19 ENCOUNTER — Other Ambulatory Visit: Payer: Self-pay

## 2018-05-19 ENCOUNTER — Ambulatory Visit: Payer: Medicaid Other

## 2018-05-19 ENCOUNTER — Inpatient Hospital Stay: Payer: Medicaid Other

## 2018-05-19 VITALS — BP 129/85 | HR 118 | Temp 97.6°F | Resp 18 | Wt 234.7 lb

## 2018-05-19 VITALS — HR 88

## 2018-05-19 DIAGNOSIS — C539 Malignant neoplasm of cervix uteri, unspecified: Secondary | ICD-10-CM

## 2018-05-19 DIAGNOSIS — Z5111 Encounter for antineoplastic chemotherapy: Secondary | ICD-10-CM

## 2018-05-19 DIAGNOSIS — R109 Unspecified abdominal pain: Secondary | ICD-10-CM

## 2018-05-19 DIAGNOSIS — D500 Iron deficiency anemia secondary to blood loss (chronic): Secondary | ICD-10-CM

## 2018-05-19 DIAGNOSIS — T451X5A Adverse effect of antineoplastic and immunosuppressive drugs, initial encounter: Secondary | ICD-10-CM

## 2018-05-19 DIAGNOSIS — R110 Nausea: Secondary | ICD-10-CM

## 2018-05-19 LAB — COMPREHENSIVE METABOLIC PANEL
ALT: 21 U/L (ref 0–44)
AST: 18 U/L (ref 15–41)
Albumin: 3.3 g/dL — ABNORMAL LOW (ref 3.5–5.0)
Alkaline Phosphatase: 69 U/L (ref 38–126)
Anion gap: 7 (ref 5–15)
BUN: 26 mg/dL — ABNORMAL HIGH (ref 6–20)
CO2: 24 mmol/L (ref 22–32)
Calcium: 8.7 mg/dL — ABNORMAL LOW (ref 8.9–10.3)
Chloride: 104 mmol/L (ref 98–111)
Creatinine, Ser: 0.69 mg/dL (ref 0.44–1.00)
GFR calc non Af Amer: >60 mL/min (ref >60)
Glucose, Bld: 113 mg/dL — ABNORMAL HIGH (ref 70–99)
Potassium: 3.6 mmol/L (ref 3.5–5.1)
Sodium: 135 mmol/L (ref 135–145)
Total Bilirubin: 0.3 mg/dL (ref 0.3–1.2)
Total Protein: 6.9 g/dL (ref 6.5–8.1)

## 2018-05-19 LAB — CBC WITH DIFFERENTIAL/PLATELET
Abs Immature Granulocytes: 0.02 10*3/uL (ref 0.00–0.07)
Basophils Absolute: 0 10*3/uL (ref 0.0–0.1)
Basophils Relative: 0 %
Eosinophils Absolute: 0 10*3/uL (ref 0.0–0.5)
Eosinophils Relative: 1 %
HCT: 31.6 % — ABNORMAL LOW (ref 36.0–46.0)
Hemoglobin: 10.2 g/dL — ABNORMAL LOW (ref 12.0–15.0)
Immature Granulocytes: 1 %
Lymphocytes Relative: 21 %
Lymphs Abs: 0.8 10*3/uL (ref 0.7–4.0)
MCH: 26.8 pg (ref 26.0–34.0)
MCHC: 32.3 g/dL (ref 30.0–36.0)
MCV: 83.2 fL (ref 80.0–100.0)
Monocytes Absolute: 0.2 10*3/uL (ref 0.1–1.0)
Monocytes Relative: 7 %
Neutro Abs: 2.5 10*3/uL (ref 1.7–7.7)
Neutrophils Relative %: 70 %
Platelets: 150 10*3/uL (ref 150–400)
RBC: 3.8 MIL/uL — ABNORMAL LOW (ref 3.87–5.11)
RDW: 24.3 % — ABNORMAL HIGH (ref 11.5–15.5)
WBC: 3.6 10*3/uL — ABNORMAL LOW (ref 4.0–10.5)
nRBC: 0 % (ref 0.0–0.2)

## 2018-05-19 MED ORDER — SODIUM CHLORIDE 0.9 % IV SOLN
Freq: Once | INTRAVENOUS | Status: AC
  Administered 2018-05-19: 12:00:00 via INTRAVENOUS
  Filled 2018-05-19: qty 5

## 2018-05-19 MED ORDER — SODIUM CHLORIDE 0.9 % IV SOLN
40.0000 mg/m2 | Freq: Once | INTRAVENOUS | Status: AC
  Administered 2018-05-19: 90 mg via INTRAVENOUS
  Filled 2018-05-19: qty 90

## 2018-05-19 MED ORDER — PALONOSETRON HCL INJECTION 0.25 MG/5ML
0.2500 mg | Freq: Once | INTRAVENOUS | Status: AC
  Administered 2018-05-19: 0.25 mg via INTRAVENOUS
  Filled 2018-05-19: qty 5

## 2018-05-19 MED ORDER — SODIUM CHLORIDE 0.9% FLUSH
10.0000 mL | INTRAVENOUS | Status: DC | PRN
  Administered 2018-05-19: 10 mL via INTRAVENOUS
  Filled 2018-05-19: qty 10

## 2018-05-19 MED ORDER — POTASSIUM CHLORIDE 2 MEQ/ML IV SOLN
Freq: Once | INTRAVENOUS | Status: AC
  Administered 2018-05-19: 10:00:00 via INTRAVENOUS
  Filled 2018-05-19: qty 10

## 2018-05-19 MED ORDER — HEPARIN SOD (PORK) LOCK FLUSH 100 UNIT/ML IV SOLN
500.0000 [IU] | Freq: Once | INTRAVENOUS | Status: AC
  Administered 2018-05-19: 500 [IU] via INTRAVENOUS
  Filled 2018-05-19: qty 5

## 2018-05-19 MED ORDER — PROMETHAZINE HCL 25 MG PO TABS: ORAL_TABLET | ORAL | 0 refills | Status: DC

## 2018-05-19 MED ORDER — HEPARIN SOD (PORK) LOCK FLUSH 100 UNIT/ML IV SOLN
INTRAVENOUS | Status: AC
  Filled 2018-05-19: qty 5

## 2018-05-19 MED ORDER — SODIUM CHLORIDE 0.9 % IV SOLN
Freq: Once | INTRAVENOUS | Status: AC
  Administered 2018-05-19: 10:00:00 via INTRAVENOUS
  Filled 2018-05-19: qty 250

## 2018-05-19 NOTE — Progress Notes (Signed)
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 Positive pelvic LN  Chief complaint/ Reason for visit-on treatment assessment prior to cycle 6 of weekly cisplatin chemotherapy  Heme/Onc history: patient is a 33 year old female G57 who initially presented to the ER on 03/15/2018 with symptoms of abdominal pain and cramping. She has also been having irregular menstrual bleeding and spotting on and off for the last few months.She had an ultrasound pelvis done in the ER which showed a large 5.8 cm hypoechoic vascular mass within the cervix and the lower uterine segment. Patient had last seen GYN in 2013 and had not had a Pap smear done since then. Patient was seen by GYN Dr. Nechama Guard on 1213 and was found to have firm friable cervix and multiple biopsies were taken which showed adenocarcinoma. She was then seen by Dr. Fransisca Connors from GYN oncology. Pelvic exam showed anteverted cervix that was replaced by tumor and extension of cancer into the left parametrium and possibly to the pelvic sidewall.  PET CT scan on 03/28/2018 showed 6 cm hypermetabolic cervical mass consistent with primary cervical carcinoma. Mild hypermetabolic bilateral parametrial right perirectal bilateral iliac lymph nodes consistent with metastatic disease. No evidence of metastatic disease within the abdomen chest or neck.  Cycle 1 of weekly cisplatin and radiation started on 04/11/2018   Interval history-tolerating chemotherapy well so far without any significant nausea or vomiting.  Abdominal pain is intermittent and self-limited.  ECOG PS- 0 Pain scale- 0 Opioid associated  constipation- no  Review of systems- Review of Systems  Constitutional: Positive for malaise/fatigue. Negative for chills, fever and weight loss.  HENT: Negative for congestion, ear discharge and nosebleeds.   Eyes: Negative for blurred vision.  Respiratory: Negative for cough, hemoptysis, sputum production, shortness of breath and wheezing.   Cardiovascular: Negative for chest pain, palpitations, orthopnea and claudication.  Gastrointestinal: Negative for abdominal pain, blood in stool, constipation, diarrhea, heartburn, melena, nausea and vomiting.  Genitourinary: Negative for dysuria, flank pain, frequency, hematuria and urgency.  Musculoskeletal: Negative for back pain, joint pain and myalgias.  Skin: Negative for rash.  Neurological: Negative for dizziness, tingling, focal weakness, seizures, weakness and headaches.  Endo/Heme/Allergies: Does not bruise/bleed easily.  Psychiatric/Behavioral: Negative for depression and suicidal ideas. The patient does not have insomnia.       Allergies  Allergen Reactions  . Amoxicillin Rash  . Penicillins Hives     Past Medical History:  Diagnosis Date  . Anxiety   . Cancer (HCC)    cervical cancer  . Hashimoto's thyroiditis   . Hypothyroid      Past Surgical History:  Procedure Laterality Date  . BUNIONECTOMY Right   . PORTA CATH INSERTION N/A 03/31/2018   Procedure: PORTA CATH INSERTION;  Surgeon: Algernon Huxley, MD;  Location: [place] [facility];  Service: Cardiovascular;  Laterality: N/A;  . TONSILLECTOMY      Social History   Socioeconomic History  . Marital status: Married    Spouse name: Not on file  . Number of children: Not on file  . Years of education: Not on file  . Highest education level: Not on file  Occupational History  . Not  on file  Social Needs  . Financial resource strain: Not on file  . Food insecurity:    Worry: Not on file    Inability: Not on file  . Transportation needs:    Medical: Not on  file    Non-medical: Not on file  Tobacco Use  . Smoking status: Current Every Day Smoker    Packs/day: 1.00    Years: 10.00    Pack years: 10.00    Types: Cigarettes  . Smokeless tobacco: Never Used  Substance and Sexual Activity  . Alcohol use: Yes    Comment: rare  . Drug use: Yes    Types: Marijuana    Comment: quit opiates 5 years ago  . Sexual activity: Yes    Birth control/protection: None  Lifestyle  . Physical activity:    Days per week: Not on file    Minutes per session: Not on file  . Stress: Not on file  Relationships  . Social connections:    Talks on phone: Not on file    Gets together: Not on file    Attends religious service: Not on file    Active member of club or organization: Not on file    Attends meetings of clubs or organizations: Not on file    Relationship status: Not on file  . Intimate partner violence:    Fear of current or ex partner: Not on file    Emotionally abused: Not on file    Physically abused: Not on file    Forced sexual activity: Not on file  Other Topics Concern  . Not on file  Social History Narrative  . Not on file    Family History  Adopted: Yes  Family history unknown: Yes     Current Outpatient Medications:  .  acetaminophen (TYLENOL) 500 MG tablet, Take 500 mg by mouth every 4 (four) hours as needed., Disp: , Rfl:  .  ALPRAZolam (XANAX) 1 MG tablet, TAKE 1 TABLET BY MOUTH THREE TIMES DAILY AS NEEDED FOR ANXIETY, Disp: 90 tablet, Rfl: 0 .  citalopram (CELEXA) 40 MG tablet, Take 1 tablet (40 mg total) by mouth daily., Disp: , Rfl:  .  dexamethasone (DECADRON) 4 MG tablet, Take 2 tablets by mouth once a day on the day after chemotherapy and then take 2 tablets two times a day for 2 days. Take with food., Disp: 30 tablet, Rfl: 1 .  levothyroxine (SYNTHROID, LEVOTHROID) 112 MCG tablet, Take 2 tablets (224 mcg total) by mouth daily., Disp: 60 tablet, Rfl: 5 .  lidocaine-prilocaine (EMLA) cream, Apply to affected area once,  Disp: 30 g, Rfl: 3 .  methadone (DOLOPHINE) 5 MG tablet, Take 15 mg by mouth daily. Once daily, Disp: , Rfl:  .  ondansetron (ZOFRAN-ODT) 4 MG disintegrating tablet, Take 1 tablet (4 mg total) by mouth every 6 (six) hours as needed for nausea or vomiting., Disp: 30 tablet, Rfl: 0 .  pantoprazole (PROTONIX) 20 MG tablet, Take 1 tablet (20 mg total) by mouth daily., Disp: 30 tablet, Rfl: 3 .  potassium chloride SA (K-DUR,KLOR-CON) 20 MEQ tablet, Take 1 tablet (20 mEq total) by mouth daily., Disp: 7 tablet, Rfl: 0 .  prochlorperazine (COMPAZINE) 10 MG tablet, Take 1 tablet (10 mg total) by mouth every 6 (six) hours as needed (Nausea or vomiting)., Disp: 30 tablet, Rfl: 1 .  promethazine (PHENERGAN) 25 MG tablet, TAKE 1/2 -1 TABLET BY MOUTH EVERY 12 HOURS AS NEEDED FOR VOMITING OR REFRACTORY NAUSEA/VOMITING, Disp: 30  tablet, Rfl: 0 No current facility-administered medications for this visit.   Facility-Administered Medications Ordered in Other Visits:  .  0.9 %  sodium chloride infusion, , Intravenous, Continuous, Sindy Guadeloupe, MD, Stopped at 04/15/18 1001 .  heparin lock flush 100 unit/mL, 500 Units, Intravenous, Once, Randa Evens C, MD .  sodium chloride flush (NS) 0.9 % injection 10 mL, 10 mL, Intravenous, PRN, Sindy Guadeloupe, MD, 10 mL at 04/15/18 0825 .  sodium chloride flush (NS) 0.9 % injection 10 mL, 10 mL, Intravenous, PRN, Sindy Guadeloupe, MD, 10 mL at 05/19/18 0810  Physical exam: There were no vitals filed for this visit. Physical Exam HENT:     Head: Normocephalic and atraumatic.  Eyes:     Pupils: Pupils are equal, round, and reactive to light.  Neck:     Musculoskeletal: Normal range of motion.  Cardiovascular:     Rate and Rhythm: Regular rhythm. Tachycardia present.     Heart sounds: Normal heart sounds.  Pulmonary:     Effort: Pulmonary effort is normal.     Breath sounds: Normal breath sounds.  Abdominal:     General: Bowel sounds are normal.     Palpations: Abdomen is  soft.  Skin:    General: Skin is warm and dry.  Neurological:     Mental Status: She is alert and oriented to person, place, and time.      CMP Latest Ref Rng & Units 05/09/2018  Glucose 70 - 99 mg/dL 79  BUN 6 - 20 mg/dL 22(H)  Creatinine 0.44 - 1.00 mg/dL 0.69  Sodium 135 - 145 mmol/L 135  Potassium 3.5 - 5.1 mmol/L 3.6  Chloride 98 - 111 mmol/L 102  CO2 22 - 32 mmol/L 24  Calcium 8.9 - 10.3 mg/dL 8.5(L)  Total Protein 6.5 - 8.1 g/dL 7.1  Total Bilirubin 0.3 - 1.2 mg/dL 0.4  Alkaline Phos 38 - 126 U/L 80  AST 15 - 41 U/L 16  ALT 0 - 44 U/L 20   CBC Latest Ref Rng & Units 05/19/2018  WBC 4.0 - 10.5 K/uL 3.6(L)  Hemoglobin 12.0 - 15.0 g/dL 10.2(L)  Hematocrit 36.0 - 46.0 % 31.6(L)  Platelets 150 - 400 K/uL 150     Assessment and plan- Patient is a 33 y.o. female withadenocarcinoma of the cervix FIGO stage III C1r T2N1M0with metastases to the external iliac(pelvic lymph nodes).She is here for on treatment assessment prior to cycle 6 of weekly cisplatin chemotherapy  Counts okay to proceed with cycle 6 of chemotherapy today.  This will also be her last radiation today.  She has met with Dr. Christel Mormon and will be starting vaginal brachii therapy shortly which will go on for about 2 to 2-1/2 weeks.  I will repeat PET scan 6 weeks from now and I will see her thereafter.  I will see her in the interim about  2 weeks from now with cbc with diff and cmp   Visit Diagnosis 1. Encounter for antineoplastic chemotherapy   2. Adenocarcinoma of cervix [facility])      Dr. Randa Evens, MD, MPH [facility] at [facility] [phone] 05/20/2018 1:15 PM

## 2018-05-19 NOTE — Progress Notes (Signed)
HR 118.  Ok to proceed per MD

## 2018-05-19 NOTE — Progress Notes (Signed)
Here for follow up. Per and mother " im doing really well" pt stated " as long as I stay ahead of nausea I do well "   Phenergan refill pended

## 2018-05-20 ENCOUNTER — Ambulatory Visit: Payer: Medicaid Other

## 2018-05-20 DIAGNOSIS — E049 Nontoxic goiter, unspecified: Secondary | ICD-10-CM | POA: Insufficient documentation

## 2018-05-20 DIAGNOSIS — C538 Malignant neoplasm of overlapping sites of cervix uteri: Secondary | ICD-10-CM | POA: Diagnosis not present

## 2018-05-20 DIAGNOSIS — E039 Hypothyroidism, unspecified: Secondary | ICD-10-CM | POA: Insufficient documentation

## 2018-05-20 DIAGNOSIS — Z720 Tobacco use: Secondary | ICD-10-CM | POA: Insufficient documentation

## 2018-05-23 DIAGNOSIS — C539 Malignant neoplasm of cervix uteri, unspecified: Secondary | ICD-10-CM | POA: Diagnosis not present

## 2018-05-27 DIAGNOSIS — C539 Malignant neoplasm of cervix uteri, unspecified: Secondary | ICD-10-CM | POA: Diagnosis not present

## 2018-05-31 DIAGNOSIS — C539 Malignant neoplasm of cervix uteri, unspecified: Secondary | ICD-10-CM | POA: Diagnosis not present

## 2018-06-02 ENCOUNTER — Inpatient Hospital Stay: Payer: Medicaid Other

## 2018-06-02 ENCOUNTER — Other Ambulatory Visit: Payer: Self-pay | Admitting: *Deleted

## 2018-06-02 ENCOUNTER — Inpatient Hospital Stay (HOSPITAL_BASED_OUTPATIENT_CLINIC_OR_DEPARTMENT_OTHER): Payer: Medicaid Other | Admitting: Oncology

## 2018-06-02 VITALS — BP 144/92 | HR 93 | Temp 98.0°F | Wt 240.2 lb

## 2018-06-02 DIAGNOSIS — T451X5A Adverse effect of antineoplastic and immunosuppressive drugs, initial encounter: Principal | ICD-10-CM

## 2018-06-02 DIAGNOSIS — C778 Secondary and unspecified malignant neoplasm of lymph nodes of multiple regions: Secondary | ICD-10-CM

## 2018-06-02 DIAGNOSIS — D649 Anemia, unspecified: Secondary | ICD-10-CM

## 2018-06-02 DIAGNOSIS — C539 Malignant neoplasm of cervix uteri, unspecified: Secondary | ICD-10-CM

## 2018-06-02 DIAGNOSIS — Z5111 Encounter for antineoplastic chemotherapy: Secondary | ICD-10-CM | POA: Diagnosis not present

## 2018-06-02 DIAGNOSIS — F419 Anxiety disorder, unspecified: Secondary | ICD-10-CM

## 2018-06-02 DIAGNOSIS — Z720 Tobacco use: Secondary | ICD-10-CM

## 2018-06-02 DIAGNOSIS — D500 Iron deficiency anemia secondary to blood loss (chronic): Secondary | ICD-10-CM

## 2018-06-02 DIAGNOSIS — D72819 Decreased white blood cell count, unspecified: Secondary | ICD-10-CM

## 2018-06-02 DIAGNOSIS — R110 Nausea: Secondary | ICD-10-CM

## 2018-06-02 LAB — CBC WITH DIFFERENTIAL/PLATELET
Abs Immature Granulocytes: 0.02 10*3/uL (ref 0.00–0.07)
BASOS ABS: 0 10*3/uL (ref 0.0–0.1)
Basophils Relative: 0 %
Eosinophils Absolute: 0 10*3/uL (ref 0.0–0.5)
Eosinophils Relative: 1 %
HCT: 28.5 % — ABNORMAL LOW (ref 36.0–46.0)
Hemoglobin: 9.3 g/dL — ABNORMAL LOW (ref 12.0–15.0)
Immature Granulocytes: 1 %
Lymphocytes Relative: 26 %
Lymphs Abs: 0.6 10*3/uL — ABNORMAL LOW (ref 0.7–4.0)
MCH: 29.1 pg (ref 26.0–34.0)
MCHC: 32.6 g/dL (ref 30.0–36.0)
MCV: 89.1 fL (ref 80.0–100.0)
Monocytes Absolute: 0.3 10*3/uL (ref 0.1–1.0)
Monocytes Relative: 15 %
Neutro Abs: 1.4 10*3/uL — ABNORMAL LOW (ref 1.7–7.7)
Neutrophils Relative %: 57 %
Platelets: 232 10*3/uL (ref 150–400)
RBC: 3.2 MIL/uL — ABNORMAL LOW (ref 3.87–5.11)
Smear Review: ADEQUATE
WBC: 2.3 10*3/uL — ABNORMAL LOW (ref 4.0–10.5)
nRBC: 0 % (ref 0.0–0.2)

## 2018-06-02 LAB — COMPREHENSIVE METABOLIC PANEL
ALT: 17 U/L (ref 0–44)
AST: 17 U/L (ref 15–41)
Albumin: 3.6 g/dL (ref 3.5–5.0)
Alkaline Phosphatase: 61 U/L (ref 38–126)
Anion gap: 7 (ref 5–15)
BUN: 14 mg/dL (ref 6–20)
CO2: 23 mmol/L (ref 22–32)
Calcium: 8.8 mg/dL — ABNORMAL LOW (ref 8.9–10.3)
Chloride: 108 mmol/L (ref 98–111)
Creatinine, Ser: 0.71 mg/dL (ref 0.44–1.00)
GFR calc non Af Amer: >60 mL/min (ref >60)
Glucose, Bld: 86 mg/dL (ref 70–99)
Potassium: 3.7 mmol/L (ref 3.5–5.1)
Sodium: 138 mmol/L (ref 135–145)
Total Bilirubin: 0.4 mg/dL (ref 0.3–1.2)
Total Protein: 7.1 g/dL (ref 6.5–8.1)

## 2018-06-02 MED ORDER — ALPRAZOLAM 1 MG PO TABS: 1.0000 mg | ORAL_TABLET | Freq: Three times a day (TID) | ORAL | 0 refills | Status: DC | PRN

## 2018-06-02 MED ORDER — PROMETHAZINE HCL 25 MG PO TABS: ORAL_TABLET | ORAL | 0 refills | Status: DC

## 2018-06-03 DIAGNOSIS — C539 Malignant neoplasm of cervix uteri, unspecified: Secondary | ICD-10-CM | POA: Diagnosis not present

## 2018-06-06 ENCOUNTER — Encounter: Payer: Self-pay | Admitting: Oncology

## 2018-06-06 NOTE — Progress Notes (Signed)
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 Positive pelvic LN   Chief complaint/ Reason for visit-routine follow-up visit of cervical cancer  Heme/Onc history: patient is a 33 year old female G71 who initially presented to the ER on 03/15/2018 with symptoms of abdominal pain and cramping. She has also been having irregular menstrual bleeding and spotting on and off for the last few months.She had an ultrasound pelvis done in the ER which showed a large 5.8 cm hypoechoic vascular mass within the cervix and the lower uterine segment. Patient had last seen GYN in 2013 and had not had a Pap smear done since then. Patient was seen by GYN Dr. Nechama Guard on 1213 and was found to have firm friable cervix and multiple biopsies were taken which showed adenocarcinoma. She was then seen by Dr. Fransisca Connors from GYN oncology. Pelvic exam showed anteverted cervix that was replaced by tumor and extension of cancer into the left parametrium and possibly to the pelvic sidewall.  PET CT scan on 03/28/2018 showed 6 cm hypermetabolic cervical mass consistent with primary cervical carcinoma. Mild hypermetabolic bilateral parametrial right perirectal bilateral iliac lymph nodes consistent with metastatic disease. No evidence of metastatic disease within the abdomen chest or neck.  Cycle 1 of weekly cisplatin and radiation started on 04/11/2018.  She completed chemoradiation on 05/19/2018 and started vaginal brachy therapy at [facility]   Interval history-patient will be finishing brachii therapy next week.  She reports she is tolerating it well.  Denies any significant  abdominal pain.  Her anxiety is under good control.  ECOG PS- 0 Pain scale- 0   Review of systems- Review of Systems  Constitutional: Positive for malaise/fatigue. Negative for chills, fever and weight loss.  HENT: Negative for congestion, ear discharge and nosebleeds.   Eyes: Negative for blurred vision.  Respiratory: Negative for cough, hemoptysis, sputum production, shortness of breath and wheezing.   Cardiovascular: Negative for chest pain, palpitations, orthopnea and claudication.  Gastrointestinal: Negative for abdominal pain, blood in stool, constipation, diarrhea, heartburn, melena, nausea and vomiting.  Genitourinary: Negative for dysuria, flank pain, frequency, hematuria and urgency.  Musculoskeletal: Negative for back pain, joint pain and myalgias.  Skin: Negative for rash.  Neurological: Negative for dizziness, tingling, focal weakness, seizures, weakness and headaches.  Endo/Heme/Allergies: Does not bruise/bleed easily.  Psychiatric/Behavioral: Negative for depression and suicidal ideas. The patient does not have insomnia.       Allergies  Allergen Reactions  . Amoxicillin Rash  . Penicillins Hives     Past Medical History:  Diagnosis Date  . Anxiety   . Cancer (HCC)    cervical cancer  . Hashimoto's thyroiditis   . Hypothyroid      Past Surgical History:  Procedure Laterality Date  . BUNIONECTOMY Right   . PORTA CATH INSERTION N/A 03/31/2018   Procedure: PORTA CATH INSERTION;  Surgeon: Algernon Huxley, MD;  Location: [place] [facility];  Service: Cardiovascular;  Laterality: N/A;  . TONSILLECTOMY      Social History   Socioeconomic History  . Marital status: Married    Spouse name: Not on file  . Number of children: Not on file  . Years of  education: Not on file  . Highest education level: Not on file  Occupational History  . Not on file  Social Needs  . Financial resource strain: Not on file  . Food insecurity:    Worry: Not on file     Inability: Not on file  . Transportation needs:    Medical: Not on file    Non-medical: Not on file  Tobacco Use  . Smoking status: Current Every Day Smoker    Packs/day: 1.00    Years: 10.00    Pack years: 10.00    Types: Cigarettes  . Smokeless tobacco: Never Used  Substance and Sexual Activity  . Alcohol use: Yes    Comment: rare  . Drug use: Yes    Types: Marijuana    Comment: quit opiates 5 years ago  . Sexual activity: Yes    Birth control/protection: None  Lifestyle  . Physical activity:    Days per week: Not on file    Minutes per session: Not on file  . Stress: Not on file  Relationships  . Social connections:    Talks on phone: Not on file    Gets together: Not on file    Attends religious service: Not on file    Active member of club or organization: Not on file    Attends meetings of clubs or organizations: Not on file    Relationship status: Not on file  . Intimate partner violence:    Fear of current or ex partner: Not on file    Emotionally abused: Not on file    Physically abused: Not on file    Forced sexual activity: Not on file  Other Topics Concern  . Not on file  Social History Narrative  . Not on file    Family History  Adopted: Yes  Family history unknown: Yes     Current Outpatient Medications:  .  levothyroxine (SYNTHROID, LEVOTHROID) 112 MCG tablet, Take 2 tablets (224 mcg total) by mouth daily., Disp: 60 tablet, Rfl: 5 .  lidocaine-prilocaine (EMLA) cream, Apply to affected area once, Disp: 30 g, Rfl: 3 .  methadone (DOLOPHINE) 5 MG tablet, Take 15 mg by mouth daily. Once daily, Disp: , Rfl:  .  nitrofurantoin, macrocrystal-monohydrate, (MACROBID) 100 MG capsule, TAKE 1 CAPSULE (100 MG TOTAL) BY MOUTH EVERY 12 (TWELVE) HOURS FOR 5 DAYS, Disp: , Rfl:  .  ondansetron (ZOFRAN-ODT) 4 MG disintegrating tablet, Take 1 tablet (4 mg total) by mouth every 6 (six) hours as needed for nausea or vomiting., Disp: 30 tablet, Rfl: 0 .  pantoprazole  (PROTONIX) 20 MG tablet, Take 1 tablet (20 mg total) by mouth daily., Disp: 30 tablet, Rfl: 3 .  prochlorperazine (COMPAZINE) 10 MG tablet, Take 1 tablet (10 mg total) by mouth every 6 (six) hours as needed (Nausea or vomiting)., Disp: 30 tablet, Rfl: 1 .  acetaminophen (TYLENOL) 500 MG tablet, Take 500 mg by mouth every 4 (four) hours as needed., Disp: , Rfl:  .  ALPRAZolam (XANAX) 1 MG tablet, Take 1 tablet (1 mg total) by mouth 3 (three) times daily as needed. for anxiety, Disp: 90 tablet, Rfl: 0 .  citalopram (CELEXA) 40 MG tablet, Take 1 tablet (40 mg total) by mouth daily. (Patient not taking: Reported on 06/02/2018), Disp: , Rfl:  .  dexamethasone (DECADRON) 4 MG tablet, Take 2 tablets by mouth once a day on the day after chemotherapy and then take 2 tablets two times a day for 2 days. Take  with food. (Patient not taking: Reported on 06/02/2018), Disp: 30 tablet, Rfl: 1 .  ondansetron (ZOFRAN) 8 MG tablet, Take by mouth., Disp: , Rfl:  .  potassium chloride SA (K-DUR,KLOR-CON) 20 MEQ tablet, Take 1 tablet (20 mEq total) by mouth daily. (Patient not taking: Reported on 05/19/2018), Disp: 7 tablet, Rfl: 0 .  promethazine (PHENERGAN) 25 MG tablet, TAKE 1/2 -1 TABLET BY MOUTH EVERY 12 HOURS AS NEEDED FOR VOMITING OR REFRACTORY NAUSEA/VOMITING, Disp: 30 tablet, Rfl: 0 No current facility-administered medications for this visit.   Facility-Administered Medications Ordered in Other Visits:  .  0.9 %  sodium chloride infusion, , Intravenous, Continuous, Sindy Guadeloupe, MD, Stopped at 04/15/18 1001 .  sodium chloride flush (NS) 0.9 % injection 10 mL, 10 mL, Intravenous, PRN, Sindy Guadeloupe, MD, 10 mL at 04/15/18 0825  Physical exam:  Vitals:   06/02/18 0955  BP: (!) 144/92  Pulse: 93  Temp: 98 F (36.7 C)  TempSrc: Oral  Weight: 240 lb 3.2 oz (109 kg)   Physical Exam Constitutional:      General: She is not in acute distress. HENT:     Head: Normocephalic and atraumatic.  Eyes:     Pupils:  Pupils are equal, round, and reactive to light.  Neck:     Musculoskeletal: Normal range of motion.  Cardiovascular:     Rate and Rhythm: Normal rate and regular rhythm.     Heart sounds: Normal heart sounds.  Pulmonary:     Effort: Pulmonary effort is normal.     Breath sounds: Normal breath sounds.  Abdominal:     General: Bowel sounds are normal.     Palpations: Abdomen is soft.  Skin:    General: Skin is warm and dry.  Neurological:     Mental Status: She is alert and oriented to person, place, and time.      CMP Latest Ref Rng & Units 06/02/2018  Glucose 70 - 99 mg/dL 86  BUN 6 - 20 mg/dL 14  Creatinine 0.44 - 1.00 mg/dL 0.71  Sodium 135 - 145 mmol/L 138  Potassium 3.5 - 5.1 mmol/L 3.7  Chloride 98 - 111 mmol/L 108  CO2 22 - 32 mmol/L 23  Calcium 8.9 - 10.3 mg/dL 8.8(L)  Total Protein 6.5 - 8.1 g/dL 7.1  Total Bilirubin 0.3 - 1.2 mg/dL 0.4  Alkaline Phos 38 - 126 U/L 61  AST 15 - 41 U/L 17  ALT 0 - 44 U/L 17   CBC Latest Ref Rng & Units 06/02/2018  WBC 4.0 - 10.5 K/uL 2.3(L)  Hemoglobin 12.0 - 15.0 g/dL 9.3(L)  Hematocrit 36.0 - 46.0 % 28.5(L)  Platelets 150 - 400 K/uL 232      Assessment and plan- Patient is a 33 y.o. female  withadenocarcinoma of the cervix FIGO stage III C1r T2N1M0with metastases to the external iliac(pelvic lymph nodes).  She is status post concurrent chemoradiation and currently undergoing vaginal brachii therapy.  She is here for routine follow-up of her cervical cancer  She has mild leukopenia and anemia likely secondary to chemotherapy which should resolve over the next few weeks.  Kidney functions are normal.  I will be repeating her PET CT scan roughly in 3 weeks time and see her after the scans  She had prior evidence of iron deficiency anemia and received 2 doses of Feraheme.  I will also be repeating her iron studies at next visit   Visit Diagnosis 1. Iron deficiency anemia due to chronic  blood loss   2. Adenocarcinoma of cervix  [facility])      Dr. Randa Evens, MD, MPH [facility] at [facility] [phone] 06/06/2018 11:40 AM

## 2018-06-07 DIAGNOSIS — C539 Malignant neoplasm of cervix uteri, unspecified: Secondary | ICD-10-CM | POA: Diagnosis not present

## 2018-06-16 DIAGNOSIS — N888 Other specified noninflammatory disorders of cervix uteri: Secondary | ICD-10-CM

## 2018-06-17 ENCOUNTER — Other Ambulatory Visit: Payer: Self-pay | Admitting: Oncology

## 2018-06-17 DIAGNOSIS — R110 Nausea: Secondary | ICD-10-CM

## 2018-06-17 DIAGNOSIS — T451X5A Adverse effect of antineoplastic and immunosuppressive drugs, initial encounter: Principal | ICD-10-CM

## 2018-06-17 DIAGNOSIS — C539 Malignant neoplasm of cervix uteri, unspecified: Secondary | ICD-10-CM

## 2018-06-20 ENCOUNTER — Other Ambulatory Visit: Payer: Self-pay | Admitting: Oncology

## 2018-06-20 ENCOUNTER — Ambulatory Visit: Payer: Self-pay | Admitting: Radiation Oncology

## 2018-06-20 DIAGNOSIS — T451X5A Adverse effect of antineoplastic and immunosuppressive drugs, initial encounter: Principal | ICD-10-CM

## 2018-06-20 DIAGNOSIS — C539 Malignant neoplasm of cervix uteri, unspecified: Secondary | ICD-10-CM

## 2018-06-20 DIAGNOSIS — R110 Nausea: Secondary | ICD-10-CM

## 2018-06-29 ENCOUNTER — Ambulatory Visit: Payer: Self-pay

## 2018-06-30 ENCOUNTER — Other Ambulatory Visit: Payer: Self-pay | Admitting: *Deleted

## 2018-06-30 ENCOUNTER — Other Ambulatory Visit: Payer: Self-pay | Admitting: Oncology

## 2018-06-30 ENCOUNTER — Other Ambulatory Visit: Payer: Self-pay

## 2018-06-30 ENCOUNTER — Telehealth: Payer: Self-pay | Admitting: *Deleted

## 2018-06-30 DIAGNOSIS — C539 Malignant neoplasm of cervix uteri, unspecified: Secondary | ICD-10-CM

## 2018-06-30 NOTE — Telephone Encounter (Signed)
Called patient to let her know that they have spoke to the GYN team and she is okay to have a scan in 3 months from radiation  for we will move the MD visit out for 3 months from radiation.  She will still come tomorrow and get her port flushed as well as seeing Dr. Baruch Gouty.  Moved her port flush to 10:00 and her appointment with Dr. Baruch Gouty was always at 10:30.  We have moved her PET scan out till May 27 and seeing the doctor on May 29.  She can get her new appointment when she comes tomorrow to see Dr. Donella Stade as well as I have noted out to her.  He is agreeable to the plan

## 2018-06-30 NOTE — Progress Notes (Signed)
ca

## 2018-07-01 ENCOUNTER — Inpatient Hospital Stay: Payer: Medicaid Other | Attending: Oncology

## 2018-07-01 ENCOUNTER — Inpatient Hospital Stay: Payer: Medicaid Other | Admitting: Oncology

## 2018-07-01 ENCOUNTER — Ambulatory Visit
Admission: RE | Admit: 2018-07-01 | Discharge: 2018-07-01 | Disposition: A | Discharge status: Home / Self Care | Payer: Medicaid Other | Source: Ambulatory Visit | Attending: Radiation Oncology | Admitting: Radiation Oncology

## 2018-07-01 ENCOUNTER — Encounter: Payer: Self-pay | Admitting: Radiation Oncology

## 2018-07-01 ENCOUNTER — Other Ambulatory Visit: Payer: Self-pay

## 2018-07-01 ENCOUNTER — Telehealth: Payer: Self-pay

## 2018-07-01 VITALS — BP 151/101 | HR 125 | Temp 97.6°F | Resp 18 | Wt 248.1 lb

## 2018-07-01 DIAGNOSIS — Z923 Personal history of irradiation: Secondary | ICD-10-CM | POA: Insufficient documentation

## 2018-07-01 DIAGNOSIS — C778 Secondary and unspecified malignant neoplasm of lymph nodes of multiple regions: Secondary | ICD-10-CM | POA: Diagnosis present

## 2018-07-01 DIAGNOSIS — Z452 Encounter for adjustment and management of vascular access device: Secondary | ICD-10-CM | POA: Insufficient documentation

## 2018-07-01 DIAGNOSIS — C539 Malignant neoplasm of cervix uteri, unspecified: Secondary | ICD-10-CM | POA: Insufficient documentation

## 2018-07-01 MED ORDER — HEPARIN SOD (PORK) LOCK FLUSH 100 UNIT/ML IV SOLN
500.0000 [IU] | Freq: Once | INTRAVENOUS | Status: AC
  Administered 2018-07-01: 500 [IU] via INTRAVENOUS
  Filled 2018-07-01: qty 5

## 2018-07-01 MED ORDER — SODIUM CHLORIDE 0.9% FLUSH
10.0000 mL | Freq: Once | INTRAVENOUS | Status: AC
  Administered 2018-07-01: 10 mL via INTRAVENOUS
  Filled 2018-07-01: qty 10

## 2018-07-01 NOTE — Progress Notes (Signed)
Radiation Oncology Follow up Note  Name: Kristy Gordon   Date:   07/01/2018 MRN:  [id number] DOB: Jul 19, 1985    This 32 y.o. female presents to the clinic today for ne-month follow-up status post brachytherapy.as well as external beam radiation therapy for stage IIIB adenocarcinoma cervix  REFERRING PROVIDER: [name],*  HPI: patient is a 33 year old female now out 1 month having completed brachytherapy as well as external beam radiation therapy for stage.IIIB (T3 N1 M0) adenocarcinoma the cervix.She seen today in routine follow-up is doing well. She specifically denies any vaginal discharge increased lower urinary tract symptoms or diarrhea. Her appetite is good She's having no pelvic pain at this time.  COMPLICATIONS OF TREATMENT: none  FOLLOW UP COMPLIANCE: keeps appointments   PHYSICAL EXAM:  BP (!) 151/101 (BP Location: Left Arm, Patient Position: Sitting)   Pulse (!) 125   Temp 97.6 F (36.4 C) (Tympanic)   Resp 18   Wt 248 lb 2 oz (112.5 kg)   BMI 38.86 kg/m  On speculum examination vaginal vault is clear. No evidence of mass or nodularity at the vaginal apex is noted. Bimanual examination shows no evidence of parametrial mass or nodularity.Well-developed well-nourished patient in NAD. HEENT reveals PERLA, EOMI, discs not visualized.  Oral cavity is clear. No oral mucosal lesions are identified. Neck is clear without evidence of cervical or supraclavicular adenopathy. Lungs are clear to A&P. Cardiac examination is essentially unremarkable with regular rate and rhythm without murmur rub or thrill. Abdomen is benign with no organomegaly or masses noted. Motor sensory and DTR levels are equal and symmetric in the upper and lower extremities. Cranial nerves II through XII are grossly intact. Proprioception is intact. No peripheral adenopathy or edema is identified. No motor or sensory levels are noted. Crude visual fields are within normal range.  RADIOLOGY RESULTS: PET CT  scan has been ordered for May 2020.  PLAN: present time patient is doing well recovering nicely from her radiation therapy treatments. She scheduled for follow-up PET CT scan in May and will follow-up with Dr. Janese Banks at that time. I have asked to see her back in 4-5 months for follow-up. Patient knows to call with any concerns at any time.  I would like to take this opportunity to thank you for allowing me to participate in the care of your patient.Noreene Filbert, MD

## 2018-07-01 NOTE — Telephone Encounter (Signed)
Appointment moved to April 1 appointment moved to June 1 as requested. She will have exam today with Dr. Baruch Gouty in radiation.

## 2018-07-05 ENCOUNTER — Other Ambulatory Visit: Payer: Self-pay | Admitting: Oncology

## 2018-07-05 DIAGNOSIS — T451X5A Adverse effect of antineoplastic and immunosuppressive drugs, initial encounter: Principal | ICD-10-CM

## 2018-07-05 DIAGNOSIS — R110 Nausea: Secondary | ICD-10-CM

## 2018-07-06 ENCOUNTER — Telehealth: Payer: Self-pay | Admitting: *Deleted

## 2018-07-06 ENCOUNTER — Inpatient Hospital Stay: Payer: Self-pay

## 2018-07-06 NOTE — Telephone Encounter (Signed)
Patient called with questions regarding exposure to someone who is now sick, her husband is coughing and not feeling well, no fever, he called his doctor and they said she needed to call us. I educated  Her on distancing and hygiene(washing hands, cover cough and sneezes, do not touch face etc). I explained that he needs to stay on one side of the house and her the other and she stated that is what they are doing. Also asking to refill her anxiety medicine. I explained to her that Dr Janese Banks is no longer refilling her Alprazolam, she needs to contact her PCP as she has completed chemotherapy. She states she will contact him.

## 2018-07-07 ENCOUNTER — Other Ambulatory Visit: Payer: Self-pay

## 2018-07-07 MED ORDER — ALPRAZOLAM 1 MG PO TABS: 1.0000 mg | ORAL_TABLET | Freq: Three times a day (TID) | ORAL | 0 refills | Status: DC | PRN

## 2018-07-25 ENCOUNTER — Other Ambulatory Visit: Payer: Self-pay | Admitting: Oncology

## 2018-07-25 DIAGNOSIS — R110 Nausea: Secondary | ICD-10-CM

## 2018-07-25 DIAGNOSIS — T451X5A Adverse effect of antineoplastic and immunosuppressive drugs, initial encounter: Principal | ICD-10-CM

## 2018-08-01 ENCOUNTER — Telehealth: Payer: Self-pay | Admitting: *Deleted

## 2018-08-01 NOTE — Telephone Encounter (Signed)
If she is having fevers she cannot come to cancer center. She needs to go to ER or urgent care

## 2018-08-01 NOTE — Telephone Encounter (Signed)
Patient called reporting that she is having left lower back pain and intermittent fevers. She states she has not been out of the house in over a month and has not been around anyone sick so it can not be COVID related. She asks if she needs anything done regarding her symptoms. Please advise

## 2018-08-01 NOTE — Telephone Encounter (Signed)
Left message on patient voice mail to go to ER or Urgent care for evaluation

## 2018-08-05 ENCOUNTER — Other Ambulatory Visit: Payer: Self-pay | Admitting: Family Medicine

## 2018-08-10 ENCOUNTER — Telehealth: Payer: Self-pay

## 2018-08-10 ENCOUNTER — Telehealth: Payer: Self-pay | Admitting: *Deleted

## 2018-08-10 DIAGNOSIS — H6690 Otitis media, unspecified, unspecified ear: Secondary | ICD-10-CM

## 2018-08-10 MED ORDER — DOXYCYCLINE HYCLATE 100 MG PO TABS: 100.0000 mg | ORAL_TABLET | Freq: Two times a day (BID) | ORAL | 0 refills | Status: AC

## 2018-08-10 NOTE — Telephone Encounter (Signed)
Patient called and stated that oncology doctor told her to contact her PCP for her symptoms she is currently having. Patient states she does not think it is right for them to push it off on Dr.Gilbert since she has been there for cancer treatments. Patient states she is having SOB, fever off/on, dry cough. Patient states she has not been out of her house in 2 months but to the cancer center once for treatment. Patient wants to know what she should do. Please advise.

## 2018-08-10 NOTE — Telephone Encounter (Signed)
Let us treat her for a otitis with clarithromycin 500 mg twice a day. #14.  If she gets worse or is has high fever or shortness of breath she needs to let us know.  I think this will help.

## 2018-08-10 NOTE — Telephone Encounter (Signed)
Advised patient that she needs to contact PCP for this. She felt as if she was being cast aside since she is no longer under treatment. I explained to her that we are not casting her aside, but that there are things that her PCP needs to handle and that this is not cancer related. She asked why she was asked every time she came in for radiation therapy if she was having shortness of breath which was before COVID. I explained to her that we always do an assessment on patients and that her lungs were not radiated and that her PCP needs to handle this for her.  She tehn asked about having to wait 8 weeks from her last port flush to get it flushed stating they have trouble after 4 weeks to get it to flush and would like to come sooner that 8 weeks. Please advise on this

## 2018-08-10 NOTE — Telephone Encounter (Signed)
How can we be sure this is not covid related? Patient has completed her chemotherapy for cervical cancer and needs to get in touch with her pcp for these needs. Thanks, Astrid Divine

## 2018-08-10 NOTE — Telephone Encounter (Signed)
Doxycycline 100mg  BID X 5 days was sent in instead due to interactions with clarithromycin and methadone.

## 2018-08-10 NOTE — Telephone Encounter (Signed)
Patient called answering service reporting shortness of breath and cough which are NOT COVID related and is asking we call in an inhaler for her. Please advise

## 2018-08-10 NOTE — Telephone Encounter (Signed)
Sent in medication as below. Patient was advised.

## 2018-08-11 ENCOUNTER — Telehealth: Payer: Self-pay | Admitting: *Deleted

## 2018-08-11 NOTE — Telephone Encounter (Signed)
Kristy Gordon will call her today

## 2018-08-11 NOTE — Telephone Encounter (Signed)
I called patient to talk to her to see how she is feling today. Talk about port flush. Let my name and number to call me back

## 2018-08-13 ENCOUNTER — Other Ambulatory Visit: Payer: Self-pay | Admitting: Oncology

## 2018-08-13 DIAGNOSIS — T451X5A Adverse effect of antineoplastic and immunosuppressive drugs, initial encounter: Secondary | ICD-10-CM

## 2018-08-13 DIAGNOSIS — R110 Nausea: Secondary | ICD-10-CM

## 2018-08-15 ENCOUNTER — Other Ambulatory Visit: Payer: Self-pay | Admitting: Family Medicine

## 2018-08-17 ENCOUNTER — Other Ambulatory Visit: Payer: Self-pay | Admitting: Oncology

## 2018-08-17 DIAGNOSIS — R110 Nausea: Secondary | ICD-10-CM

## 2018-08-17 DIAGNOSIS — T451X5A Adverse effect of antineoplastic and immunosuppressive drugs, initial encounter: Secondary | ICD-10-CM

## 2018-08-17 NOTE — Telephone Encounter (Signed)
Rx sent to pharmacy on 08/14/18.

## 2018-08-23 ENCOUNTER — Encounter: Payer: Self-pay | Admitting: *Deleted

## 2018-08-23 ENCOUNTER — Other Ambulatory Visit: Payer: Self-pay | Admitting: Oncology

## 2018-08-23 DIAGNOSIS — C539 Malignant neoplasm of cervix uteri, unspecified: Secondary | ICD-10-CM

## 2018-08-24 NOTE — Telephone Encounter (Signed)
I have called her the next day and left message, she never called back. I called again and there was no voicemail and this week I sent my chart message and no response

## 2018-08-31 ENCOUNTER — Encounter
Admission: RE | Admit: 2018-08-31 | Discharge: 2018-08-31 | Disposition: A | Discharge status: Home / Self Care | Payer: Medicaid Other | Source: Ambulatory Visit | Attending: Oncology | Admitting: Oncology

## 2018-08-31 ENCOUNTER — Other Ambulatory Visit: Payer: Self-pay

## 2018-08-31 DIAGNOSIS — C539 Malignant neoplasm of cervix uteri, unspecified: Secondary | ICD-10-CM | POA: Diagnosis present

## 2018-08-31 LAB — GLUCOSE, CAPILLARY: Glucose-Capillary: 94 mg/dL (ref 70–99)

## 2018-08-31 MED ORDER — FLUDEOXYGLUCOSE F - 18 (FDG) INJECTION
12.6900 | Freq: Once | INTRAVENOUS | Status: AC | PRN
  Administered 2018-08-31: 12.69 via INTRAVENOUS

## 2018-09-02 ENCOUNTER — Inpatient Hospital Stay: Payer: Medicaid Other

## 2018-09-02 ENCOUNTER — Other Ambulatory Visit: Payer: Self-pay

## 2018-09-02 ENCOUNTER — Other Ambulatory Visit: Payer: Self-pay | Admitting: *Deleted

## 2018-09-02 ENCOUNTER — Other Ambulatory Visit: Payer: Medicaid Other

## 2018-09-02 ENCOUNTER — Inpatient Hospital Stay: Payer: Medicaid Other | Attending: Oncology | Admitting: Oncology

## 2018-09-02 DIAGNOSIS — R509 Fever, unspecified: Secondary | ICD-10-CM

## 2018-09-02 DIAGNOSIS — M549 Dorsalgia, unspecified: Secondary | ICD-10-CM

## 2018-09-02 DIAGNOSIS — C778 Secondary and unspecified malignant neoplasm of lymph nodes of multiple regions: Secondary | ICD-10-CM | POA: Insufficient documentation

## 2018-09-02 DIAGNOSIS — C539 Malignant neoplasm of cervix uteri, unspecified: Secondary | ICD-10-CM | POA: Diagnosis not present

## 2018-09-02 LAB — URINALYSIS, COMPLETE (UACMP) WITH MICROSCOPIC
Bacteria, UA: NONE SEEN
Bilirubin Urine: NEGATIVE
Glucose, UA: NEGATIVE mg/dL
Ketones, ur: NEGATIVE mg/dL
Leukocytes,Ua: NEGATIVE
Nitrite: NEGATIVE
Protein, ur: NEGATIVE mg/dL
Specific Gravity, Urine: 1.021 (ref 1.005–1.030)
pH: 5 (ref 5.0–8.0)

## 2018-09-03 LAB — URINE CULTURE: Culture: NO GROWTH

## 2018-09-04 LAB — NOVEL CORONAVIRUS, NAA: SARS-CoV-2, NAA: NOT DETECTED

## 2018-09-05 ENCOUNTER — Other Ambulatory Visit: Payer: Self-pay

## 2018-09-06 ENCOUNTER — Inpatient Hospital Stay: Payer: Medicaid Other | Attending: Oncology

## 2018-09-06 ENCOUNTER — Other Ambulatory Visit: Payer: Self-pay | Admitting: *Deleted

## 2018-09-06 ENCOUNTER — Inpatient Hospital Stay (HOSPITAL_BASED_OUTPATIENT_CLINIC_OR_DEPARTMENT_OTHER): Payer: Medicaid Other | Admitting: Oncology

## 2018-09-06 ENCOUNTER — Encounter: Payer: Self-pay | Admitting: Oncology

## 2018-09-06 ENCOUNTER — Other Ambulatory Visit: Payer: Self-pay

## 2018-09-06 VITALS — BP 124/86 | HR 91 | Temp 99.5°F | Resp 18 | Ht 67.0 in | Wt 270.5 lb

## 2018-09-06 DIAGNOSIS — F17210 Nicotine dependence, cigarettes, uncomplicated: Secondary | ICD-10-CM | POA: Diagnosis not present

## 2018-09-06 DIAGNOSIS — H838X2 Other specified diseases of left inner ear: Secondary | ICD-10-CM

## 2018-09-06 DIAGNOSIS — Z79899 Other long term (current) drug therapy: Secondary | ICD-10-CM | POA: Insufficient documentation

## 2018-09-06 DIAGNOSIS — F329 Major depressive disorder, single episode, unspecified: Secondary | ICD-10-CM | POA: Diagnosis not present

## 2018-09-06 DIAGNOSIS — F1111 Opioid abuse, in remission: Secondary | ICD-10-CM | POA: Diagnosis not present

## 2018-09-06 DIAGNOSIS — C539 Malignant neoplasm of cervix uteri, unspecified: Secondary | ICD-10-CM | POA: Insufficient documentation

## 2018-09-06 DIAGNOSIS — Z79890 Hormone replacement therapy (postmenopausal): Secondary | ICD-10-CM | POA: Insufficient documentation

## 2018-09-06 DIAGNOSIS — H938X2 Other specified disorders of left ear: Secondary | ICD-10-CM

## 2018-09-06 DIAGNOSIS — Z720 Tobacco use: Secondary | ICD-10-CM | POA: Diagnosis not present

## 2018-09-06 DIAGNOSIS — F419 Anxiety disorder, unspecified: Secondary | ICD-10-CM | POA: Insufficient documentation

## 2018-09-06 DIAGNOSIS — R102 Pelvic and perineal pain: Secondary | ICD-10-CM | POA: Insufficient documentation

## 2018-09-06 DIAGNOSIS — M545 Low back pain: Secondary | ICD-10-CM | POA: Diagnosis not present

## 2018-09-06 DIAGNOSIS — G8929 Other chronic pain: Secondary | ICD-10-CM | POA: Diagnosis not present

## 2018-09-06 DIAGNOSIS — Z95828 Presence of other vascular implants and grafts: Secondary | ICD-10-CM

## 2018-09-06 DIAGNOSIS — N9419 Other specified dyspareunia: Secondary | ICD-10-CM

## 2018-09-06 DIAGNOSIS — N941 Unspecified dyspareunia: Secondary | ICD-10-CM | POA: Insufficient documentation

## 2018-09-06 DIAGNOSIS — E063 Autoimmune thyroiditis: Secondary | ICD-10-CM | POA: Diagnosis not present

## 2018-09-06 DIAGNOSIS — R1030 Lower abdominal pain, unspecified: Secondary | ICD-10-CM | POA: Insufficient documentation

## 2018-09-06 DIAGNOSIS — Z9221 Personal history of antineoplastic chemotherapy: Secondary | ICD-10-CM | POA: Diagnosis not present

## 2018-09-06 DIAGNOSIS — Z923 Personal history of irradiation: Secondary | ICD-10-CM | POA: Diagnosis not present

## 2018-09-06 DIAGNOSIS — D500 Iron deficiency anemia secondary to blood loss (chronic): Secondary | ICD-10-CM

## 2018-09-06 DIAGNOSIS — E28319 Asymptomatic premature menopause: Secondary | ICD-10-CM | POA: Insufficient documentation

## 2018-09-06 LAB — COMPREHENSIVE METABOLIC PANEL
ALT: 19 U/L (ref 0–44)
AST: 23 U/L (ref 15–41)
Albumin: 4 g/dL (ref 3.5–5.0)
Alkaline Phosphatase: 73 U/L (ref 38–126)
Anion gap: 9 (ref 5–15)
BUN: 11 mg/dL (ref 6–20)
CO2: 23 mmol/L (ref 22–32)
Calcium: 8.8 mg/dL — ABNORMAL LOW (ref 8.9–10.3)
Chloride: 105 mmol/L (ref 98–111)
Creatinine, Ser: 0.82 mg/dL (ref 0.44–1.00)
GFR calc Af Amer: >60 mL/min (ref >60)
GFR calc non Af Amer: >60 mL/min (ref >60)
Glucose, Bld: 119 mg/dL — ABNORMAL HIGH (ref 70–99)
Potassium: 3.6 mmol/L (ref 3.5–5.1)
Sodium: 137 mmol/L (ref 135–145)
Total Bilirubin: 0.3 mg/dL (ref 0.3–1.2)
Total Protein: 7.7 g/dL (ref 6.5–8.1)

## 2018-09-06 LAB — CBC WITH DIFFERENTIAL/PLATELET
Abs Immature Granulocytes: 0.02 10*3/uL (ref 0.00–0.07)
Basophils Absolute: 0.1 10*3/uL (ref 0.0–0.1)
Basophils Relative: 1 %
Eosinophils Absolute: 0.2 10*3/uL (ref 0.0–0.5)
Eosinophils Relative: 3 %
HCT: 38.7 % (ref 36.0–46.0)
Hemoglobin: 13.2 g/dL (ref 12.0–15.0)
Immature Granulocytes: 0 %
Lymphocytes Relative: 28 %
Lymphs Abs: 1.5 10*3/uL (ref 0.7–4.0)
MCH: 33.1 pg (ref 26.0–34.0)
MCHC: 34.1 g/dL (ref 30.0–36.0)
MCV: 97 fL (ref 80.0–100.0)
Monocytes Absolute: 0.6 10*3/uL (ref 0.1–1.0)
Monocytes Relative: 10 %
Neutro Abs: 3.1 10*3/uL (ref 1.7–7.7)
Neutrophils Relative %: 58 %
Platelets: 348 10*3/uL (ref 150–400)
RBC: 3.99 MIL/uL (ref 3.87–5.11)
RDW: 11.4 % — ABNORMAL LOW (ref 11.5–15.5)
WBC: 5.4 10*3/uL (ref 4.0–10.5)
nRBC: 0 % (ref 0.0–0.2)

## 2018-09-06 LAB — IRON AND TIBC
Iron: 82 ug/dL (ref 28–170)
Saturation Ratios: 23 % (ref 10.4–31.8)
TIBC: 359 ug/dL (ref 250–450)
UIBC: 277 ug/dL

## 2018-09-06 LAB — FERRITIN: Ferritin: 166 ng/mL (ref 11–307)

## 2018-09-06 MED ORDER — HEPARIN SOD (PORK) LOCK FLUSH 100 UNIT/ML IV SOLN
500.0000 [IU] | Freq: Once | INTRAVENOUS | Status: AC
  Administered 2018-09-06: 500 [IU] via INTRAVENOUS

## 2018-09-06 MED ORDER — SODIUM CHLORIDE 0.9% FLUSH
10.0000 mL | Freq: Once | INTRAVENOUS | Status: AC
  Administered 2018-09-06: 10 mL via INTRAVENOUS
  Filled 2018-09-06: qty 10

## 2018-09-06 NOTE — Progress Notes (Signed)
She is still having pain in pelvic area and radiating around to her low back. It is the same pain that was there in the beginning and it does not hurt quite as bad as it did. She has low grade fever today and had one last week.

## 2018-09-07 ENCOUNTER — Other Ambulatory Visit: Payer: Self-pay | Admitting: *Deleted

## 2018-09-07 ENCOUNTER — Encounter: Payer: Self-pay | Admitting: *Deleted

## 2018-09-07 ENCOUNTER — Other Ambulatory Visit: Payer: Self-pay

## 2018-09-07 ENCOUNTER — Inpatient Hospital Stay (HOSPITAL_BASED_OUTPATIENT_CLINIC_OR_DEPARTMENT_OTHER): Payer: Medicaid Other | Admitting: Obstetrics and Gynecology

## 2018-09-07 VITALS — BP 136/91 | HR 106 | Resp 20 | Ht 67.0 in | Wt 271.2 lb

## 2018-09-07 DIAGNOSIS — Z923 Personal history of irradiation: Secondary | ICD-10-CM | POA: Diagnosis not present

## 2018-09-07 DIAGNOSIS — Z9221 Personal history of antineoplastic chemotherapy: Secondary | ICD-10-CM

## 2018-09-07 DIAGNOSIS — G8929 Other chronic pain: Secondary | ICD-10-CM

## 2018-09-07 DIAGNOSIS — C539 Malignant neoplasm of cervix uteri, unspecified: Secondary | ICD-10-CM | POA: Diagnosis not present

## 2018-09-07 DIAGNOSIS — F17210 Nicotine dependence, cigarettes, uncomplicated: Secondary | ICD-10-CM

## 2018-09-07 DIAGNOSIS — R102 Pelvic and perineal pain: Secondary | ICD-10-CM | POA: Diagnosis not present

## 2018-09-07 LAB — CA 125: Cancer Antigen (CA) 125: 27.1 U/mL (ref 0.0–38.1)

## 2018-09-07 NOTE — Progress Notes (Signed)
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 Positive pelvic LN   Chief complaint/ Reason for visit- discuss pet ct scan results  Heme/Onc history:  patient is a 33 year old female G19 who initially presented to the ER on 03/15/2018 with symptoms of abdominal pain and cramping. She has also been having irregular menstrual bleeding and spotting on and off for the last few months.She had an ultrasound pelvis done in the ER which showed a large 5.8 cm hypoechoic vascular mass within the cervix and the lower uterine segment. Patient had last seen GYN in 2013 and had not had a Pap smear done since then. Patient was seen by GYN Dr. Nechama Guard on 1213 and was found to have firm friable cervix and multiple biopsies were taken which showed adenocarcinoma. She was then seen by Dr. Fransisca Connors from GYN oncology. Pelvic exam showed anteverted cervix that was replaced by tumor and extension of cancer into the left parametrium and possibly to the pelvic sidewall.  PET CT scan on 03/28/2018 showed 6 cm hypermetabolic cervical mass consistent with primary cervical carcinoma. Mild hypermetabolic bilateral parametrial right perirectal bilateral iliac lymph nodes consistent with metastatic disease. No evidence of metastatic disease within the abdomen chest or neck.  Cycle 1 of weekly cisplatin and radiation started on 04/11/2018.  She  completed chemoradiation on 05/19/2018 and started vaginal [facility] at [facility]   Interval history- 1. Patient had a temp of 100.2 last week and was ruled out for covid. She had no other symptoms  2. Patient feels that her hearing in her left ear is altered as if it is under water.   3. Reports chronic lower abdominal and back pain which she has had even before starting chemotherapy and has remained unchanged.  4. Reports sexual intercourse is painful and she bleeds during intercourse  ECOG PS- 0 Pain scale- 4   Review of systems- Review of Systems  Constitutional: Negative for chills, fever, malaise/fatigue and weight loss.  HENT: Positive for hearing loss. Negative for congestion, ear discharge and nosebleeds.   Eyes: Negative for blurred vision.  Respiratory: Negative for cough, hemoptysis, sputum production, shortness of breath and wheezing.   Cardiovascular: Negative for chest pain, palpitations, orthopnea and claudication.  Gastrointestinal: Positive for abdominal pain. Negative for blood in stool, constipation, diarrhea, heartburn, melena, nausea and vomiting.  Genitourinary: Negative for dysuria, flank pain, frequency, hematuria and urgency.  Musculoskeletal: Positive for back pain. Negative for joint pain and myalgias.  Skin: Negative for rash.  Neurological: Negative for dizziness, tingling, focal weakness, seizures, weakness and headaches.  Endo/Heme/Allergies: Does not bruise/bleed easily.  Psychiatric/Behavioral: Negative for depression and suicidal ideas. The patient does not have insomnia.       Allergies  Allergen Reactions  . Amoxicillin Rash  . Penicillins Hives     Past Medical History:  Diagnosis Date  . Anxiety   . Cancer (HCC)    cervical cancer  . Hashimoto's thyroiditis   . Hypothyroid  Past Surgical History:  Procedure Laterality Date  . BUNIONECTOMY Right   . PORTA CATH INSERTION N/A 03/31/2018   Procedure: PORTA CATH INSERTION;   Surgeon: Algernon Huxley, MD;  Location: [place] [facility];  Service: Cardiovascular;  Laterality: N/A;  . TONSILLECTOMY      Social History   Socioeconomic History  . Marital status: Married    Spouse name: Not on file  . Number of children: Not on file  . Years of education: Not on file  . Highest education level: Not on file  Occupational History  . Not on file  Social Needs  . Financial resource strain: Not on file  . Food insecurity:    Worry: Not on file    Inability: Not on file  . Transportation needs:    Medical: Not on file    Non-medical: Not on file  Tobacco Use  . Smoking status: Current Every Day Smoker    Packs/day: 0.25    Years: 10.00    Pack years: 2.50    Types: Cigarettes  . Smokeless tobacco: Never Used  . Tobacco comment: 3 cig a day and trying to stop  Substance and Sexual Activity  . Alcohol use: Yes    Comment: rare  . Drug use: Yes    Types: Marijuana    Comment: quit opiates 5 years ago  . Sexual activity: Yes    Birth control/protection: None  Lifestyle  . Physical activity:    Days per week: Not on file    Minutes per session: Not on file  . Stress: Not on file  Relationships  . Social connections:    Talks on phone: Not on file    Gets together: Not on file    Attends religious service: Not on file    Active member of club or organization: Not on file    Attends meetings of clubs or organizations: Not on file    Relationship status: Not on file  . Intimate partner violence:    Fear of current or ex partner: Not on file    Emotionally abused: Not on file    Physically abused: Not on file    Forced sexual activity: Not on file  Other Topics Concern  . Not on file  Social History Narrative  . Not on file    Family History  Adopted: Yes  Family history unknown: Yes     Current Outpatient Medications:  .  acetaminophen (TYLENOL) 500 MG tablet, Take 500 mg by mouth every 4 (four) hours as needed., Disp: , Rfl:  .   ALPRAZolam (XANAX) 1 MG tablet, TAKE ONE TABLET BY MOUTH THREE TIMES A DAY AS NEEDED, Disp: 90 tablet, Rfl: 2 .  estradiol-norethindrone (ACTIVELLA) 1-0.5 MG tablet, Take 1 tablet by mouth daily. , Disp: , Rfl:  .  levothyroxine (SYNTHROID) 125 MCG tablet, TAKE 2 TABLETS(250 MCG) BY MOUTH DAILY, Disp: 180 tablet, Rfl: 0 .  lidocaine-prilocaine (EMLA) cream, Apply to affected area once, Disp: 30 g, Rfl: 3 .  ondansetron (ZOFRAN) 8 MG tablet, TAKE 1 TABLET BY MOUTH TWICE DAILY AS NEEDED (START  ON  THE THIRD DAY  AFTER  CHEMOTHERAPY), Disp: 30 tablet, Rfl: 0 .  pantoprazole (PROTONIX) 20 MG tablet, Take 1 tablet (20 mg total) by mouth daily., Disp: 30 tablet, Rfl: 3 .  promethazine (PHENERGAN) 25 MG tablet, TAKE 1/2 TO 1 (ONE-HALF TO ONE) TABLET BY MOUTH EVERY 12 HOURS AS NEEDED FOR  REFRACTORY  NAUSEA  OR  VOMITING, Disp: 30 tablet, Rfl: 0 .  ondansetron (ZOFRAN-ODT) 4 MG disintegrating tablet, Take 1 tablet (4 mg total) by mouth every 6 (six) hours as needed for nausea or vomiting., Disp: 30 tablet, Rfl: 0 .  prochlorperazine (COMPAZINE) 10 MG tablet, TAKE 1 TABLET BY MOUTH EVERY 6 HOURS AS NEEDED FOR NAUSEA AND VOMITING (Patient not taking: Reported on 09/06/2018), Disp: 30 tablet, Rfl: 0 No current facility-administered medications for this visit.   Facility-Administered Medications Ordered in Other Visits:  .  0.9 %  sodium chloride infusion, , Intravenous, Continuous, Sindy Guadeloupe, MD, Stopped at 04/15/18 1001 .  sodium chloride flush (NS) 0.9 % injection 10 mL, 10 mL, Intravenous, PRN, Sindy Guadeloupe, MD, 10 mL at 04/15/18 0825  Physical exam:  Vitals:   09/06/18 1340  BP: 124/86  Pulse: 91  Resp: 18  Temp: 99.5 F (37.5 C)  TempSrc: Tympanic  Weight: 270 lb 8 oz (122.7 kg)  Height: 5\' 7"  (1.702 m)   Physical Exam   CMP Latest Ref Rng & Units 09/06/2018  Glucose 70 - 99 mg/dL 119(H)  BUN 6 - 20 mg/dL 11  Creatinine 0.44 - 1.00 mg/dL 0.82  Sodium 135 - 145 mmol/L 137  Potassium  3.5 - 5.1 mmol/L 3.6  Chloride 98 - 111 mmol/L 105  CO2 22 - 32 mmol/L 23  Calcium 8.9 - 10.3 mg/dL 8.8(L)  Total Protein 6.5 - 8.1 g/dL 7.7  Total Bilirubin 0.3 - 1.2 mg/dL 0.3  Alkaline Phos 38 - 126 U/L 73  AST 15 - 41 U/L 23  ALT 0 - 44 U/L 19   CBC Latest Ref Rng & Units 09/06/2018  WBC 4.0 - 10.5 K/uL 5.4  Hemoglobin 12.0 - 15.0 g/dL 13.2  Hematocrit 36.0 - 46.0 % 38.7  Platelets 150 - 400 K/uL 348    No images are attached to the encounter.  Nm Pet Image Restag (ps) Skull Base To Thigh  Result Date: 08/31/2018 CLINICAL DATA:  Subsequent treatment strategy for cervical cancer. EXAM: NUCLEAR MEDICINE PET SKULL BASE TO THIGH TECHNIQUE: 12.7 mCi F-18 FDG was injected intravenously. Full-ring PET imaging was performed from the skull base to thigh after the radiotracer. CT data was obtained and used for attenuation correction and anatomic localization. Fasting blood glucose: 94 mg/dl COMPARISON:  03/28/2018 FINDINGS: Mediastinal blood pool activity: SUV max  2.1 Liver activity: SUV max NA NECK: No hypermetabolic lymph nodes in the neck. Diffuse uptake again identified in the thyroid gland bilaterally. Incidental CT findings: none CHEST: No hypermetabolic mediastinal or hilar nodes. No suspicious pulmonary nodules on the CT scan. Incidental CT findings: Right Port-A-Cath tip is positioned in the right atrium. ABDOMEN/PELVIS: No abnormal hypermetabolic activity within the liver, pancreas, adrenal glands, or spleen. No hypermetabolic lymph nodes in the abdomen or pelvis. Cervical hypermetabolism seen previously has decreased substantially in the interval although somewhat obscured today by urine activity in the adjacent bladder. Cervical tissue has decreased markedly in size in the interval measuring 3.1 x 3.8 cm today compared to 6.3 x 6.2 cm previously. The 8 mm hypermetabolic perirectal node seen previously has resolved in the interval. The 13 mm right external iliac node identified previously  has resolved, measuring only 4 mm today with no residual hypermetabolism. 11 mm left external iliac node has resolved, measuring 3 mm today without residual hypermetabolism. Incidental CT findings: none SKELETON: No focal hypermetabolic activity to suggest skeletal metastasis. Incidental CT findings: none IMPRESSION: 1. Clear interval response to therapy. Cervix has  decreased substantially in size in the interval with no substantial hypermetabolism today although SUV measurement is difficult due to misregistration with adjacent radiolabeled urine in the bladder. 2. Interval resolution of the hypermetabolic pelvic lymphadenopathy seen previously. 3. No new or progressive findings on today's study. Electronically Signed   By: Misty Stanley M.D.   On: 08/31/2018 13:00     Assessment and plan- Patient is a 33 y.o. female withadenocarcinoma of the cervix FIGO stage III C1r T2N1M0with metastases to the external iliac(pelvic lymph nodes).  She is status post concurrent chemoradiation and vaginal brachytherapy. She is here to discuss PET/CT results  1. I have reviewed PET CT images independently and discussed findings with the patient. Pelvic adenopathy has resolved. No significant hypermetabolism noted in cervix. No evidence of distant metastatic disease. Moving forward as far as cervical cancer surveillance is concerned- no indication for surveillance scans unless concerning signs and symptoms. She will need exam Q3-6 months for 1st 2 years followed by every 6 months. I will see her back in 6 months with cbc with diff and cmp.  Patient sees Dr. Purvis Kilts tomorrow and I will alternate my visits with gyn onc thereafter  2. Left ear congestion. I have examined both her ears. No evidence of inflammation/ infection. EAC and tympanic membrane is clear. It is unclear if she is having residual side effects from cisplatin. I will refer her to ENT for further evaluation  3. Chronic lower abdominal pain and back pain-  unrelated to malignancy.  4. Anxiety- on anxiety meds per pcp   Total face to face encounter time for this patient visit was 30 min. >50% of the time was  spent in counseling and coordination of care.     Visit Diagnosis 1. Adenocarcinoma of cervix [facility])      Dr. Randa Evens, MD, MPH [facility] [phone] 09/07/2018 8:24 AM

## 2018-09-07 NOTE — Progress Notes (Signed)
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 Positive pelvic LN  Subjective:  Kristy Gordon is a 33 y.o. G30 female, initially seen in consultation from Dr. Gilman Schmidt for cervical adenocarcinoma.    Cycle 1 of weekly cisplatin and radiation started on 04/11/2018. She completed chemoradiation on 05/19/2018 and started vaginal brachytherapy at [facility].  PET - 08/31/2018 for restaging IMPRESSION: 1. Clear interval response to therapy. Cervix has decreased substantially in size in the interval with no substantial hypermetabolism today although SUV measurement is difficult due to misregistration with adjacent radiolabeled urine in the bladder. 2. Interval resolution of the hypermetabolic pelvic lymphadenopathy seen previously. 3. No new or progressive findings on today's study.  Since completion of radiation she continues to have left lower quadrant pain, hot flashes despite HRT, and pain with bleeding on intercourse. She has not yet started vaginal dilator therapy.    Gyn-Onc history:  Kristy Gordon is a pleasant G63 female, initially seen in consultation from Dr. Gilman Schmidt.  She presented to the ER 03/15/18 for sharp right sided abdominal pain that feels like uterine cramps. This pain had been present for about 2 weeks.  She reported irregular bleeding and the ER ordered a pelvic US which showed a cervical mass.She reports that she has not been feeling well for the last 2-3 weeks. It has been since at least 2013 since she saw a gynecologist. She has not had a pap smear since then. She reports regular monthly periods every 28 days. She denies vaginal bleeding after intercourse.  No sexually active for 2-3 weeks.    Seen by Dr Gilman Schmidt 12/13 in office and and cervix firm and friable.  Multiple biopsies showed adenocarcinoma. PAP pending. States today that she has pain in left pelvis.   She reports that she is a  smoker, she smokes about a pack per day. She has been smoking for 15 years.   She was seen by Dr. Fransisca Connors. Pelvic exam showed anteverted cervix that was replaced by tumor and extension of cancer into the left parametrium and possibly to the pelvic sidewall.We discussed that preservation of fertility is not possible with radiation treatment. She lost several pregnancies and does not have any children, but no longer desires fertility and husband confirmed this. So this is not an issue.  PET CT scan on 03/28/2018 showed 6 cm hypermetabolic cervical mass consistent with primary cervical carcinoma. Mild hypermetabolic bilateral parametrial right perirectal bilateral iliac lymph nodes consistent with metastatic disease. No evidence of metastatic disease within the abdomen chest or neck.  Problem List: Patient Active Problem List   Diagnosis Date Noted  . Nausea without vomiting 04/25/2018  . Iron deficiency anemia 04/11/2018  . Goals of care, counseling/discussion 03/28/2018  . 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[place]) 03/23/2018  . Hypothyroidism due to Hashimoto's thyroiditis 03/18/2018  . H/O fetal demise, not currently pregnant 03/18/2018  . Anxiety 09/05/2015  . Clinical depression 09/05/2015  . Big thyroid 09/05/2015  . Cannot sleep 09/05/2015  . Adiposity 09/05/2015  . 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 Current tobacco use 09/05/2015    Past Medical History: Past Medical History:  Diagnosis Date  . Anxiety   . Cancer (HCC)    cervical cancer  . Hashimoto's thyroiditis   . Hypothyroid     Past Surgical History: Past Surgical History:  Procedure Laterality Date  . BUNIONECTOMY Right   .  PORTA CATH INSERTION N/A 03/31/2018   Procedure: PORTA CATH INSERTION;  Surgeon: Algernon Huxley, MD;  Location: [place] [facility];  Service: Cardiovascular;  Laterality: N/A;  . TONSILLECTOMY      OB History:  OB History  Gravida Para  Term Preterm AB Living  10 2   2 8  0  SAB TAB Ectopic Multiple Live Births  8       0    # Outcome Date GA Lbr Len/2nd Weight Sex Delivery Anes PTL Lv  10 SAB           9 SAB           8 SAB           7 SAB           6 SAB           5 SAB           4 SAB           3 SAB           2 Preterm      Vag-Spont   FD  1 Preterm      Vag-Spont   FD    Family History: Family History  Adopted: Yes  Family history unknown: Yes    Social History: Social History   Socioeconomic History  . Marital status: Married    Spouse name: Not on file  . Number of children: Not on file  . Years of education: Not on file  . Highest education level: Not on file  Occupational History  . Not on file  Social Needs  . Financial resource strain: Not on file  . Food insecurity:    Worry: Not on file    Inability: Not on file  . Transportation needs:    Medical: Not on file    Non-medical: Not on file  Tobacco Use  . Smoking status: Current Every Day Smoker    Packs/day: 0.25    Years: 10.00    Pack years: 2.50    Types: Cigarettes  . Smokeless tobacco: Never Used  . Tobacco comment: 3 cig a day and trying to stop  Substance and Sexual Activity  . Alcohol use: Yes    Comment: rare  . Drug use: Yes    Types: Marijuana    Comment: quit opiates 5 years ago  . Sexual activity: Yes    Birth control/protection: None  Lifestyle  . Physical activity:    Days per week: Not on file    Minutes per session: Not on file  . Stress: Not on file  Relationships  . Social connections:    Talks on phone: Not on file    Gets together: Not on file    Attends religious service: Not on file    Active member of club or organization: Not on file    Attends meetings of clubs or organizations: Not on file    Relationship status: Not on file  . Intimate partner violence:    Fear of current or ex partner: Not on file    Emotionally abused: Not on file    Physically abused: Not on file    Forced sexual  activity: Not on file  Other Topics Concern  . Not on file  Social History Narrative  . Not on file    Allergies: Allergies  Allergen Reactions  . Amoxicillin Rash  . Penicillins Hives    Current Medications: Current Outpatient  Medications  Medication Sig Dispense Refill  . acetaminophen (TYLENOL) 500 MG tablet Take 500 mg by mouth every 4 (four) hours as needed.    . ALPRAZolam (XANAX) 1 MG tablet TAKE ONE TABLET BY MOUTH THREE TIMES A DAY AS NEEDED 90 tablet 2  . estradiol-norethindrone (ACTIVELLA) 1-0.5 MG tablet Take 1 tablet by mouth daily.     Marland Kitchen levothyroxine (SYNTHROID) 125 MCG tablet TAKE 2 TABLETS(250 MCG) BY MOUTH DAILY 180 tablet 0  . lidocaine-prilocaine (EMLA) cream Apply to affected area once 30 g 3  . ondansetron (ZOFRAN) 8 MG tablet TAKE 1 TABLET BY MOUTH TWICE DAILY AS NEEDED (START  ON  THE THIRD DAY  AFTER  CHEMOTHERAPY) 30 tablet 0  . ondansetron (ZOFRAN-ODT) 4 MG disintegrating tablet Take 1 tablet (4 mg total) by mouth every 6 (six) hours as needed for nausea or vomiting. 30 tablet 0  . pantoprazole (PROTONIX) 20 MG tablet Take 1 tablet (20 mg total) by mouth daily. 30 tablet 3  . prochlorperazine (COMPAZINE) 10 MG tablet TAKE 1 TABLET BY MOUTH EVERY 6 HOURS AS NEEDED FOR NAUSEA AND VOMITING (Patient not taking: Reported on 09/06/2018) 30 tablet 0  . promethazine (PHENERGAN) 25 MG tablet TAKE 1/2 TO 1 (ONE-HALF TO ONE) TABLET BY MOUTH EVERY 12 HOURS AS NEEDED FOR  REFRACTORY  NAUSEA  OR  VOMITING 30 tablet 0   No current facility-administered medications for this visit.    Facility-Administered Medications Ordered in Other Visits  Medication Dose Route Frequency Provider Last Rate Last Dose  . 0.9 %  sodium chloride infusion   Intravenous Continuous Sindy Guadeloupe, MD   Stopped at 04/15/18 1001  . sodium chloride flush (NS) 0.9 % injection 10 mL  10 mL Intravenous PRN Sindy Guadeloupe, MD   10 mL at 04/15/18 0825    Review of Systems - as noted in interval  history General:  no complaints Skin: no complaints Eyes: no complaints HEENT: no complaints Breasts: no complaints Pulmonary: no complaints Cardiac: no complaints Gastrointestinal: persistent left lower abdominal pain Genitourinary/Sexual: pain with intercourse and bleeding Ob/Gyn: no complaints Musculoskeletal: no complaints Hematology: no complaints Neurologic/Psych: no complaints   Objective:  Physical Examination:  BP (!) 136/91 (BP Location: Left Arm, Patient Position: Sitting)   Pulse (!) 106   Resp 20   Ht 5\' 7"  (1.702 m)   Wt 271 lb 3.2 oz (123 kg)   BMI 42.48 kg/m    ECOG Performance Status: 1 - Symptomatic but completely ambulatory  General appearance: alert, cooperative and appears stated age HEENT:PERRLA, thyroid without masses and trachea midline Lymph node survey: non-palpable, axillary, inguinal, supraclavicular Cardiovascular: regular rate and rhythm, no murmurs or gallops Respiratory: normal air entry, lungs clear to auscultation and no rales, rhonchi or wheezing Breast exam: not examined. Abdomen: normal on inspection, no masses, ascites, or organomegaly. On palpation mild TTP in the LLQ, no RGR Back: inspection of back is normal Extremities: extremities normal, atraumatic, no cyanosis or edema Skin exam - normal coloration and turgor, no rashes, no suspicious skin lesions noted. Neurological exam reveals alert, oriented, normal speech, no focal findings or movement disorder noted.  Pelvic: exam chaperoned by NP;  Vulva: normal appearing vulva with no masses, tenderness or lesions; Vagina: normal vagina except agglutinated at the apex. The posterior vaginal wall is adhered to the posterior cervix; Adnexa: normal adnexa in size, nontender and no masses; Uterus: uterus is normal size, shape, consistency and nontender; Cervix: no tumor seen, but only able  to visualize the anterior lip of the cervix. Unable to visualize os. On BME the left parametria is smooth  and there is not nodularity or mass.  Rectal: deferred.   Vaginal dilator teaching performed.      Assessment:  Kristy Gordon is a 33 y.o. female diagnosed with at least stage IIB cervical adenocarcinoma s/p primary chemoradiation with excellent response based on PET scan and clinically NED on exam.   Left lower quadrant abdominal/pelvic pain, most likely secondary to prior cervical cancer and extension to the parametria. Suspect she will have chronic symptoms as her pain has not improved.   Premature menopause and poorly controlled vasomotor symptoms on current HRT regimen.   Sexual function concerns and vaginal agglutination.  Medical co-morbidities complicating care: anxiety, depression, thyroiditis, smoker.  Plan:   Problem List Items Addressed This Visit      Genitourinary   Adenocarcinoma of cervix [facility]) - Primary     Continue close surveillance with Dr. Baruch Gouty in August and our clinic in November.   Pain clinic care given persistent LLQ pain and high likelihood for chronic pain management.   Recommended vaginal dilator therapy and pelvic floor PT given her concerns with sexual function, pain with intercourse, and bleeding.   We contacted Dr. Sheilah Pigeon for recommendations regarding HRT to reduce her vasomotor symptoms. We also requested that she follow up with her PCP for thyroid testing.   The patient's diagnosis, an outline of the further diagnostic and laboratory studies which will be required, the recommendation, and alternatives were discussed. We also reviewed her PET scan which was reassuring.   All questions were answered to the patient's satisfaction.  A total of 25 minutes were spent with the patient/family today; at least 50% was spent in education, counseling and coordination of care for cervical cancer.    Verlon Au, NP   I personally had a face to face interaction and evaluated the patient jointly with the NP, Ms. Beckey Rutter.  I have reviewed her  history and available records and have performed the key portions of the physical exam including General, HEENT, abdominal exam, pelvic exam with my findings confirming those documented above by the APP.  I have discussed the case with the APP and the patient.  I agree with the above documentation, assessment and plan which was fully formulated by me.  Counseling was completed by me.   I personally saw the patient and performed a substantive portion of this encounter in conjunction with the listed APP as documented above.   Gaetana Michaelis, MD  ADDENDUM:  Dr. Marijean Bravo referred [place] to Dr. Gunnar Bulla who provide 2 different options including transdermal ERT with Activella or switching to OCP. We will follow up with her for further information on dosing.   Gaetana Michaelis, MD    CC:  Dr Gilman Schmidt

## 2018-09-12 ENCOUNTER — Other Ambulatory Visit: Payer: Self-pay

## 2018-09-12 ENCOUNTER — Ambulatory Visit: Payer: Medicaid Other | Attending: Nurse Practitioner

## 2018-09-12 DIAGNOSIS — R293 Abnormal posture: Secondary | ICD-10-CM | POA: Insufficient documentation

## 2018-09-12 DIAGNOSIS — M62838 Other muscle spasm: Secondary | ICD-10-CM | POA: Diagnosis not present

## 2018-09-12 DIAGNOSIS — L599 Disorder of the skin and subcutaneous tissue related to radiation, unspecified: Secondary | ICD-10-CM | POA: Diagnosis not present

## 2018-09-12 NOTE — Therapy (Signed)
[place] MAIN [facility] SERVICES [address] [place], [place], [address] Phone: [phone]   Fax:  [phone]  Physical Therapy Evaluation  The patient has been informed of current processes in place at Outpatient Rehab to protect patients from Covid-19 exposure including social distancing, schedule modifications, and new cleaning procedures. After discussing their particular risk with a therapist based on the patient's personal risk factors, the patient has decided to proceed with in-person therapy.   Patient Details  Name: RHETT NAJERA MRN: [id number] Date of Birth: 1985-08-01 Referring Provider (PT): Beckey Rutter   Encounter Date: 09/12/2018  PT End of Session - 09/14/18 1159    Visit Number  1    Number of Visits  10    Date for PT Re-Evaluation  11/21/18    Authorization Type  MCAID    Authorization Time Period  through 10/03/2018    Authorization - Visit Number  1    Authorization - Number of Visits  4    PT Start Time  1330    PT Stop Time  1430    PT Time Calculation (min)  60 min    Activity Tolerance  Patient tolerated treatment well;No increased pain    Behavior During Therapy  WFL for tasks assessed/performed       Past Medical History:  Diagnosis Date  . Anxiety   . Cancer (HCC)    cervical cancer  . Hashimoto's thyroiditis   . Hypothyroid     Past Surgical History:  Procedure Laterality Date  . BUNIONECTOMY Right   . PORTA CATH INSERTION N/A 03/31/2018   Procedure: PORTA CATH INSERTION;  Surgeon: Algernon Huxley, MD;  Location: [place] [facility];  Service: Cardiovascular;  Laterality: N/A;  . TONSILLECTOMY      There were no vitals filed for this visit.    Pelvic Floor Physical Therapy Evaluation and Assessment  SCREENING  Falls in last 6 mo: no    Patient's communication preference:   Red Flags:  Have you had any night sweats? Yes, is [place] Unexplained weight loss? no Saddle anesthesia?  no Unexplained changes in bowel or bladder habits? no  SUBJECTIVE  Patient reports: She is unable to hear from her L ear. She has recently undergone both chemo and radiation for cervical CA. Is having pain in her L hip where her tumor was and vaginally with intercourse.  Precautions:  Cervical CA  Social/Family/Vocational History:   Bunionectomy on R foot in May 2014  Recent Procedures/Tests/Findings:  Chemo and Radiation completed on March 3 for Cervical CA.   Obstetrical History: N/A. Unable to conceive due to radiation therapy  Gynecological History: Cervical CA  Urinary History: Had a UTI during treatment, nothing else  Gastrointestinal History: Has BM without straining ~ daily  Sexual activity/pain: Pain with intercourse that feels like tearing with deep penetration.  Location of pain: L hip and LB/abdomen Current pain:  6/10  Max pain:  9/10 Least pain:  0/10 Nature of pain: Sharp/stabbing.  Patient Goals: Haver sex without pain and not have pain in the hip all the time.   OBJECTIVE  Posture/Observations:  Sitting:  Standing: R knee hyperextends>L, R PSIS high.   Palpation/Segmental Motion/Joint Play:  Special tests:   Leg-length: 90.5 B (not exact test due to body habitus.  Supine to long sit: R long in lying (R ant rot)  Range of Motion/Flexibilty:  Spine: R SB, 2 fingers from knee, L B to knee, Pain on L  side. R rot WNL, L rot ~ 20% reduced with pain on L, fingers ~ 1 inch from floor with legs stopping motion Hips:   Strength/MMT: deferred to follow-up visit LE MMT  LE MMT Left Right  Hip flex:  (L2) /5 /5  Hip ext: /5 /5  Hip abd: /5 /5  Hip add: /5 /5  Hip IR /5 /5  Hip ER /5 /5     Abdominal:  Palpation: TTP to L>R Psoas and Iliacus, QL, and R>L adductors (pectineus greatest) Diastasis: not assessed  Pelvic Floor External Exam: Deferred until deemed necessary due to Covid-19 precaustions Introitus Appears:  Skin integrity:   Palpation: Cough: Prolapse visible?: Scar mobility:  Internal Vaginal Exam: Strength (PERF):  Symmetry: Palpation: Prolapse:   Internal Rectal Exam: Strength (PERF): Symmetry: Palpation: Prolapse:   Gait Analysis: deferred to next visit.   Pelvic Floor Outcome Measures: PDI: 48/75  INTERVENTIONS THIS SESSION: Self-care: Educated on the structure and function of the pelvic floor in relation to their symptoms as well as the POC, and initial HEP in order to set patient expectations and understanding from which we will build on in the future sessions. Educated on dilators and discussed the future potential to use a tool at home for internal TP release.   Total time: 60 min.      [facility] PT Assessment - 09/14/18 0001      Assessment   Medical Diagnosis  Adenocarcinoma of Cervix, Pelvic Pain    Referring Provider (PT)  Beckey Rutter    Onset Date/Surgical Date  03/15/18    Prior Therapy  none      Precautions   Precautions  --   Cervical CA     Restrictions   Weight Bearing Restrictions  No      Balance Screen   Has the patient fallen in the past 6 months  No      [place] residence    Living Arrangements  Parent    Available Help at Discharge  Family    Type of [place] to enter    Entrance Stairs-Number of Steps  8    Entrance Stairs-Rails  Right    Home Layout  Two level    Alternate Level Stairs-Number of Steps  15   15   Alternate Level Stairs-Rails  Left      Prior Function   Level of Independence  Independent    Vocation  Other (comment)   Cancer Diagnosis 03/2019     Cognition   Overall Cognitive Status  Within Functional Limits for tasks assessed                Objective measurements completed on examination: See above findings.                PT Short Term Goals - 09/14/18 1220      PT SHORT TERM GOAL #1   Title  Patient will demonstrate improved  pelvic alignment and balance of musculature surrounding the pelvis to facilitate decreased PFM spasms and decrease pelvic pain.    Time  3    Period  Weeks    Status  New    Target Date  10/03/18      PT SHORT TERM GOAL #2   Title  Pt. will describe successful implementation of dialator use by progressing by 1 dilator size to begin improving tissue extensibility for decreased pain with  intercourse.    Baseline  Pt. is unable to  fully insert ~ size #4 dilator given to her by MD.    Time  3    Period  Weeks    Status  New    Target Date  10/03/18      PT SHORT TERM GOAL #3   Title  Pt. will successfully implement vaginal moisturizer and massage program to improve vaginal tissue health and decrease pain with dilator use and intercourse.    Baseline  Pt. lacks knowleddge of how/when/what to use to improve vaginal tissue health for decreased pain.    Time  3    Period  Weeks    Status  New    Target Date  10/03/18      PT SHORT TERM GOAL #4   Title  Patient will demonstrate HEP x1 in the clinic to demonstrate understanding and proper form to allow for further improvement.    Baseline  Pt. lacks knowledge of therapeutic exercises that can help decrease her pain.    Time  3    Period  Weeks    Status  New    Target Date  10/03/18        PT Long Term Goals - 09/14/18 1221      PT LONG TERM GOAL #1   Title  Patient will describe pain no greater than 1/10 during Intercourse and ADL's to demonstrate improved functional ability.    Baseline  Pain can increase to 9/10 with standing, sitting, sleeping, lifting    Time  10    Period  Weeks    Status  New    Target Date  11/21/18      PT LONG TERM GOAL #2   Title  Patient will demonstrate ability to perform self internal TP release in order to facilitate further PFM spasm reduction at home for faster resolution of symptoms.    Baseline  Pt. lacks knowledge of how to perform self internal TP release for decreased pain with intercourse.     Time  10    Period  Weeks    Status  New    Target Date  11/21/18      PT LONG TERM GOAL #3   Title  Patient will report no pain with intercourse to demonstrate improved functional ability.    Baseline  Pt is unable to tolerate intercourse due to pain and fear of vaginal penetration following cervical CA treatments.    Time  10    Period  Weeks    Status  New    Target Date  11/21/18      PT LONG TERM GOAL #4   Title  Patient will score at or below 24/75 on the [facility] [employer] to demonstrate a clinically significant decrease in disability and improved functional ability.    Baseline  PDI: 48/75    Time  10    Period  Weeks    Status  New    Target Date  11/21/18             Plan - 09/13/18 0846    Clinical Impression Statement  Pt. is a 33 y/o female who presents today with cheif c/o pelvic pain and dyspareunia following recent battle with cervical CA including chemo and radiation. Her PMH is also relevant for Anxiety, Depression, Hasimoto's and Hypothyroidism. Her Clinical exam revealed a R anterior innominate rotation and true vs. apparent leg-length discrepancy as well as hyperlordosis and R>L genu recurvatum. Pt.  history also indicates treatment-related tissue changes of vaginal tissue due to chemo and radiation and stress/anxiety around returning to sexual intimacy following her treatment. She will benefit from skilled pelvic health PT to address the noted defecits and continue to assess for and address other potential causes of pelvic pain and dyspareunia.     Personal Factors and Comorbidities  Comorbidity 3+;Finances    Comorbidities  Anxiety, depression, cancer, hashimoto's, hyperthyroid    Examination-Activity Limitations  Sleep;Lift;Squat;Bend;Sit;Stand;Stairs    Examination-Participation Restrictions  Interpersonal Relationship;Church;Yard Work;Cleaning;Laundry;Community Activity;Other   use of dialator   Stability/Clinical Decision Making  Unstable/Unpredictable    Clinical  Decision Making  High    Rehab Potential  Good    PT Frequency  1x / week    PT Duration  Other (comment)   10 weeks   PT Treatment/Interventions  ADLs/Self Care Home Management;Biofeedback;Aquatic Therapy;Traction;Functional mobility training;Gait training;Therapeutic activities;Therapeutic exercise;Neuromuscular re-education;Cognitive remediation;Manual techniques;Patient/family education;Scar mobilization;Passive range of motion;Dry needling;Taping;Energy conservation;Spinal Manipulations;Joint Manipulations    PT Next Visit Plan  analyze gait, do manual for R anterior rotation, re-assess gait and determine need for heel lift functionally. give HEP    Recommended Other Services  sex therapy    Consulted and Agree with Plan of Care  Patient       Patient will benefit from skilled therapeutic intervention in order to improve the following deficits and impairments:  Decreased skin integrity, Increased fascial restricitons, Impaired sensation, Improper body mechanics, Pain, Decreased scar mobility, Hypermobility, Increased muscle spasms, Impaired tone, Postural dysfunction, Hypomobility, Decreased range of motion, Decreased strength, Increased edema, Impaired flexibility  Visit Diagnosis: Other muscle spasm  Abnormal posture  Disorder of the skin and subcutaneous tissue related to radiation, unspecified     Problem List Patient Active Problem List   Diagnosis Date Noted  . Nausea without vomiting 04/25/2018  . Iron deficiency anemia 04/11/2018  . Goals of care, counseling/discussion 03/28/2018  . 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[place]) 03/23/2018  . Hypothyroidism due to Hashimoto's thyroiditis 03/18/2018  . H/O fetal demise, not currently pregnant 03/18/2018  . Anxiety 09/05/2015  . Clinical depression 09/05/2015  . Big thyroid 09/05/2015  . Cannot sleep 09/05/2015  . Adiposity 09/05/2015  . Nondependent opioid abuse in remission ([place]) 09/05/2015  . 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  Current tobacco use 09/05/2015   Willa Rough DPT, ATC Willa Rough 09/14/2018, 12:35 PM  [place] MAIN [facility] SERVICES [address] [place], [place], [address] Phone: [phone]   Fax:  [phone]  Name: SEIRRA KOS MRN: [id number] Date of Birth: 1986-03-07

## 2018-09-19 ENCOUNTER — Ambulatory Visit: Payer: Medicaid Other

## 2018-09-20 ENCOUNTER — Encounter: Payer: Self-pay | Admitting: *Deleted

## 2018-09-26 ENCOUNTER — Ambulatory Visit: Payer: Medicaid Other

## 2018-10-03 ENCOUNTER — Ambulatory Visit: Payer: Medicaid Other

## 2018-10-03 ENCOUNTER — Other Ambulatory Visit: Payer: Self-pay

## 2018-10-03 DIAGNOSIS — M62838 Other muscle spasm: Secondary | ICD-10-CM

## 2018-10-03 DIAGNOSIS — R293 Abnormal posture: Secondary | ICD-10-CM

## 2018-10-03 DIAGNOSIS — L599 Disorder of the skin and subcutaneous tissue related to radiation, unspecified: Secondary | ICD-10-CM

## 2018-10-03 NOTE — Patient Instructions (Signed)
  Keep your trunk as one unit and let it hinge forward from the hips as you push your bottom back and bend your knees at the same rate that you bend your hips. Keep your weight back toward your heels but do not actually lift the toes off the ground. Exhale starting just before and all the way through standing to help engage the glutes and lower tummy muscles.   Flexors, Lunge  Hip Flexor Stretch: Proposal Pose    Maintain pelvic tuck under, lift pubic bone toward navel. Engage posterior hip muscles (firm glute muscles of leg in back position) and shift forward until you feel stretch on front of leg that is down. To increase stretch, maintain balance and ease hips forward. You may use one hand on a chair for balance if needed. Hold for __5__ breaths. Repeat __2-3__ times each leg.  Do _1-2__ times per day.  *look at handouts for lymphedema and CA and sexuality.

## 2018-10-03 NOTE — Therapy (Signed)
[place] MAIN [facility] SERVICES [address] [place], [place], [address] Phone: [phone]   Fax:  [phone]  Physical Therapy Treatment   The patient has been informed of current processes in place at Outpatient Rehab to protect patients from Covid-19 exposure including social distancing, schedule modifications, and new cleaning procedures. After discussing their particular risk with a therapist based on the patient's personal risk factors, the patient has decided to proceed with in-person therapy.  Patient Details  Name: Kristy Gordon MRN: [id number] Date of Birth: 1985/07/15 Referring Provider (PT): Beckey Rutter   Encounter Date: 10/03/2018  PT End of Session - 10/04/18 0840    Visit Number  2    Number of Visits  10    Date for PT Re-Evaluation  11/21/18    Authorization Type  MCAID    Authorization Time Period  through 10/03/2018    Authorization - Visit Number  2    Authorization - Number of Visits  4    PT Start Time  1330    PT Stop Time  1430    PT Time Calculation (min)  60 min    Activity Tolerance  Patient tolerated treatment well;No increased pain    Behavior During Therapy  WFL for tasks assessed/performed       Past Medical History:  Diagnosis Date  . Anxiety   . Cancer (HCC)    cervical cancer  . Hashimoto's thyroiditis   . Hypothyroid     Past Surgical History:  Procedure Laterality Date  . BUNIONECTOMY Right   . PORTA CATH INSERTION N/A 03/31/2018   Procedure: PORTA CATH INSERTION;  Surgeon: Algernon Huxley, MD;  Location: [place] [facility];  Service: Cardiovascular;  Laterality: N/A;  . TONSILLECTOMY      There were no vitals filed for this visit.    Pelvic Floor Physical Therapy Treatment Note  SCREENING  Changes in medications, allergies, or medical history?: no   SUBJECTIVE  Patient reports: Has been doing squats, has been doing squats with increased pain. Doing yoga dvd 3x/wk  Precautions:   Cervical CA  Pain update:  Location of pain: LLB and pelvis Current pain:  0/10  Max pain:  6/10 Least pain:  0/10 Nature of pain: Sharp/stabbing.  *Pt. C/o mild pain in LB in supine pre treatment and none post-treatment.  Patient Goals: Haver sex without pain and not have pain in the hip all the time.   OBJECTIVE  Changes in: Posture/Observations:  Hyperlordosis, ~ 50% improved pelvic alignment following treatment.  Abdominal:  Difficulty coordinating posterior pelvic tilt to allow for hip-flexor stretch, able with mod cueing  Palpation: TTP to B Iliacus  Gait Analysis: BLE ER and decreased hip EXT on L>R.  INTERVENTIONS THIS SESSION: self-care:  Educated on yoga and tai chi classes through [place] center. Discussed sex therapy referral, vaginal moisturizers, how lymphedema can affect her healing, risks/benefits of dry needling vs. TP release for spasm and pain reduction, and how to perform dilation to maximize benefit. All education aimed at creating a whole-body approach to healing due to systemic effects of chemo and radiation as well as psychosocial impacts of a cancer diagnosis and treatment on pain and sexual function. Manual: Performed TP release and STM to B iliacus to decrease spasm and pain and allow for improved balance of musculature for improved function and decreased symptoms. Dry-needle: Performed TPND as described below with a .30x34m needle and standard approach in supine. Therex: educated on simple  self massage and  to reduce swelling in the LLE and abdomen and provided educational handout for further understanding. Educated on and practiced kneeling hip-flexor stretch To maintain and improve muscle length and allow for improved balance of musculature for long-term symptom relief.    Total time: 60 min.                   Trigger Point Dry Needling - 10/04/18 0001    Consent Given?  Yes    Education Handout Provided  No    Muscles Treated  Back/Hip  Iliacus    Dry Needling Comments  Bilat    Iliacus Response  Twitch response elicited;Palpable increased muscle length             PT Short Term Goals - 10/04/18 0904      PT SHORT TERM GOAL #1   Title  Patient will demonstrate improved pelvic alignment and balance of musculature surrounding the pelvis to facilitate decreased PFM spasms and decrease pelvic pain.    Baseline  R anterior rotation and spasms surrounding pelvis. As of 6/29: decreased hip-flexor spasms and improved pelvic alignment by ~ 50%    Time  3    Period  Weeks    Status  Partially Met    Target Date  10/03/18      PT SHORT TERM GOAL #2   Title  Pt. will describe successful implementation of dialator use by progressing by 1 dilator size to begin improving tissue extensibility for decreased pain with intercourse.    Baseline  Pt. is unable to  fully insert ~ size #4 dilator given to her by MD. Pt. using dilators regularly but unable to progress yet due to pain, tissue fragility, and bleeding    Time  3    Period  Weeks    Status  On-going    Target Date  10/03/18      PT SHORT TERM GOAL #3   Title  Pt. will successfully implement vaginal moisturizer and massage program to improve vaginal tissue health and decrease pain with dilator use and intercourse.    Baseline  Pt. lacks knowleddge of how/when/what to use to improve vaginal tissue health for decreased pain.    Time  3    Period  Weeks    Status  Achieved    Target Date  10/03/18      PT SHORT TERM GOAL #4   Title  Patient will demonstrate HEP x1 in the clinic to demonstrate understanding and proper form to allow for further improvement.    Baseline  Pt. lacks knowledge of therapeutic exercises that can help decrease her pain.    Time  3    Period  Weeks    Status  Achieved    Target Date  10/03/18        PT Long Term Goals - 09/14/18 1221      PT LONG TERM GOAL #1   Title  Patient will describe pain no greater than 1/10 during  Intercourse and ADL's to demonstrate improved functional ability.    Baseline  Pain can increase to 9/10 with standing, sitting, sleeping, lifting    Time  10    Period  Weeks    Status  New    Target Date  11/21/18      PT LONG TERM GOAL #2   Title  Patient will demonstrate ability to perform self internal TP release in order to facilitate further PFM spasm reduction at  home for faster resolution of symptoms.    Baseline  Pt. lacks knowledge of how to perform self internal TP release for decreased pain with intercourse.    Time  10    Period  Weeks    Status  New    Target Date  11/21/18      PT LONG TERM GOAL #3   Title  Patient will report no pain with intercourse to demonstrate improved functional ability.    Baseline  Pt is unable to tolerate intercourse due to pain and fear of vaginal penetration following cervical CA treatments.    Time  10    Period  Weeks    Status  New    Target Date  11/21/18      PT LONG TERM GOAL #4   Title  Patient will score at or below 24/75 on the [facility] to demonstrate a clinically significant decrease in disability and improved functional ability.    Baseline  [place]: 48/75    Time  10    Period  Weeks    Status  New    Target Date  11/21/18            Plan - 10/04/18 0848    Clinical Impression Statement  Pt. responded well to all interventions today, demonstrating decreased pain and increased hip EXT B as well as understanding of all education and exercises provided today. She has only completed 1 of her 3 follow-up visits to date, out of no fault of her own, and has already been implementing suggestions and demonstrating improvement. Due to her recent battle with cancer and medical complexity she will definately benefit from further skilled pelvic health physical therapy 1x/wk for at least 8 more weeks to continue to work toward the established goals and decrease pain.    Personal Factors and Comorbidities  Comorbidity 3+;Finances     Comorbidities  Anxiety, depression, cancer, hashimoto's, hyperthyroid    Examination-Activity Limitations  Sleep;Lift;Squat;Bend;Sit;Stand;Stairs    Examination-Participation Restrictions  Interpersonal Relationship;Church;Yard Work;Cleaning;Laundry;Community Activity;Other   use of dialator   Stability/Clinical Decision Making  Unstable/Unpredictable    Rehab Potential  Good    PT Frequency  1x / week    PT Duration  Other (comment)   10 weeks   PT Treatment/Interventions  ADLs/Self Care Home Management;Biofeedback;Aquatic Therapy;Traction;Functional mobility training;Gait training;Therapeutic activities;Therapeutic exercise;Neuromuscular re-education;Cognitive remediation;Manual techniques;Patient/family education;Scar mobilization;Passive range of motion;Dry needling;Taping;Energy conservation;Spinal Manipulations;Joint Manipulations    PT Next Visit Plan  analyze gait, do manual for R anterior rotation, re-assess gait and determine need for heel lift functionally. give HEP    PT Home Exercise Plan  lubricants/vaginal moisturizers, lymphedema massage, hip-flexor stretch, dilator use    Consulted and Agree with Plan of Care  Patient       Patient will benefit from skilled therapeutic intervention in order to improve the following deficits and impairments:  Decreased skin integrity, Increased fascial restricitons, Impaired sensation, Improper body mechanics, Pain, Decreased scar mobility, Hypermobility, Increased muscle spasms, Impaired tone, Postural dysfunction, Hypomobility, Decreased range of motion, Decreased strength, Increased edema, Impaired flexibility  Visit Diagnosis: 1. Other muscle spasm   2. Abnormal posture   3. Disorder of the skin and subcutaneous tissue related to radiation, unspecified        Problem List Patient Active Problem List   Diagnosis Date Noted  . Nausea without vomiting 04/25/2018  . Iron deficiency anemia 04/11/2018  . Goals of care,  counseling/discussion 03/28/2018  . 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[place]) 03/23/2018  . Hypothyroidism  due to Hashimoto's thyroiditis 03/18/2018  . H/O fetal demise, not currently pregnant 03/18/2018  . Anxiety 09/05/2015  . Clinical depression 09/05/2015  . Big thyroid 09/05/2015  . Cannot sleep 09/05/2015  . Adiposity 09/05/2015  . 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 Current tobacco use 09/05/2015   Kristy Gordon DPT, ATC Kristy Gordon 10/04/2018, 9:24 AM  [place] MAIN [facility] SERVICES [address] [place], [place], [address] Phone: [phone]   Fax:  [phone]  Name: Kristy Gordon MRN: [id number] Date of Birth: 1985/08/22

## 2018-10-10 ENCOUNTER — Ambulatory Visit: Payer: Medicaid Other | Attending: Nurse Practitioner

## 2018-10-10 ENCOUNTER — Other Ambulatory Visit: Payer: Self-pay | Admitting: Family Medicine

## 2018-10-10 ENCOUNTER — Other Ambulatory Visit: Payer: Self-pay

## 2018-10-10 DIAGNOSIS — L599 Disorder of the skin and subcutaneous tissue related to radiation, unspecified: Secondary | ICD-10-CM | POA: Insufficient documentation

## 2018-10-10 DIAGNOSIS — R293 Abnormal posture: Secondary | ICD-10-CM | POA: Diagnosis not present

## 2018-10-10 DIAGNOSIS — M62838 Other muscle spasm: Secondary | ICD-10-CM | POA: Diagnosis not present

## 2018-10-10 NOTE — Therapy (Signed)
[place] MAIN [facility] SERVICES [address] [place], [place], [address] Phone: [phone]   Fax:  [phone]  Physical Therapy Treatment  The patient has been informed of current processes in place at Outpatient Rehab to protect patients from Covid-19 exposure including social distancing, schedule modifications, and new cleaning procedures. After discussing their particular risk with a therapist based on the patient's personal risk factors, the patient has decided to proceed with in-person therapy.  Patient Details  Name: Kristy Gordon MRN: [id number] Date of Birth: Sep 01, 1985 Referring Provider (PT): Beckey Rutter   Encounter Date: 10/10/2018  PT End of Session - 10/10/18 1633    Visit Number  3    Number of Visits  10    Date for PT Re-Evaluation  11/21/18    Authorization Type  [employer]    Authorization Time Period  12/04/18    Authorization - Visit Number  3    Authorization - Number of Visits  10    PT Start Time  1230    PT Stop Time  1330    PT Time Calculation (min)  60 min    Activity Tolerance  Patient tolerated treatment well;No increased pain    Behavior During Therapy  WFL for tasks assessed/performed       Past Medical History:  Diagnosis Date  . Anxiety   . Cancer (HCC)    cervical cancer  . Hashimoto's thyroiditis   . Hypothyroid     Past Surgical History:  Procedure Laterality Date  . BUNIONECTOMY Right   . PORTA CATH INSERTION N/A 03/31/2018   Procedure: PORTA CATH INSERTION;  Surgeon: Algernon Huxley, MD;  Location: [facility];  Service: Cardiovascular;  Laterality: N/A;  . TONSILLECTOMY      There were no vitals filed for this visit.    Pelvic Floor Physical Therapy Treatment Note  SCREENING  Changes in medications, allergies, or medical history?: none    SUBJECTIVE  Patient reports: She has just been having some cramping in her LLQ, feels like period cramps. Feels better when she lays on her L  side.  Precautions:  Cervical CA  Pain update: " crampiness, not as bad as before"  Patient Goals: Haver sex without pain and not have pain in the hip all the time.   OBJECTIVE  Changes in: Posture/Observations:  LLE long in supine and sitting, ASIS appear even in supine.  In standing: L PSIS high pre-heel-lift, even following lift. Hyperlordotic  L anterior rotation appears present once heel-lift in place    Palpation: TTP to L QL and Iliacus, Psoas.  Gait Analysis: BLE ER/ twist with toe-off indicating decreased mid-foot mobility. B feet over-pronation, Genu valgus.  INTERVENTIONS THIS SESSION: Self-care: Educated on laying on her R side to decrease L QL spasm return and using and gradually increasing heel-lift wear to improve pelvic alignment and allow for decreased pressure through lumbosacral nerve roots for decreased spasm and pain, as well as using Vit-E/coconut Oil  Suppositories to improve vaginal tissue health for better ability to dilate/decreased tearing and bleeding. Manual: Performed TP release and STM to L QL and Iliacus to decrease spasm and pain and allow for improved pelvic alignment and PFM relaxation. Dry-needle: Performed TPDN with a .30x49m needle and standard approach as described below to decrease spasm and pain and allow for improved balance of musculature for improved function and decreased symptoms. Therex: Educated on and practiced hip-flexor stretch, Pidgeon pose, side-stretch, child's pose and frog  stretch To maintain and improve muscle length and allow for improved balance of musculature for long-term symptom relief.   Total time: 60 min.                    Trigger Point Dry Needling - 10/10/18 0001    Consent Given?  Yes    Education Handout Provided  No    Muscles Treated Back/Hip  Iliacus;Quadratus lumborum    Dry Needling Comments  Left    Iliacus Response  Twitch response elicited;Palpable increased muscle length     Quadratus Lumborum Response  Twitch response elicited;Palpable increased muscle length             PT Short Term Goals - 10/04/18 0904      PT SHORT TERM GOAL #1   Title  Patient will demonstrate improved pelvic alignment and balance of musculature surrounding the pelvis to facilitate decreased PFM spasms and decrease pelvic pain.    Baseline  R anterior rotation and spasms surrounding pelvis. As of 6/29: decreased hip-flexor spasms and improved pelvic alignment by ~ 50%    Time  3    Period  Weeks    Status  Partially Met    Target Date  10/03/18      PT SHORT TERM GOAL #2   Title  Pt. will describe successful implementation of dialator use by progressing by 1 dilator size to begin improving tissue extensibility for decreased pain with intercourse.    Baseline  Pt. is unable to  fully insert ~ size #4 dilator given to her by MD. Pt. using dilators regularly but unable to progress yet due to pain, tissue fragility, and bleeding    Time  3    Period  Weeks    Status  On-going    Target Date  10/03/18      PT SHORT TERM GOAL #3   Title  Pt. will successfully implement vaginal moisturizer and massage program to improve vaginal tissue health and decrease pain with dilator use and intercourse.    Baseline  Pt. lacks knowleddge of how/when/what to use to improve vaginal tissue health for decreased pain.    Time  3    Period  Weeks    Status  Achieved    Target Date  10/03/18      PT SHORT TERM GOAL #4   Title  Patient will demonstrate HEP x1 in the clinic to demonstrate understanding and proper form to allow for further improvement.    Baseline  Pt. lacks knowledge of therapeutic exercises that can help decrease her pain.    Time  3    Period  Weeks    Status  Achieved    Target Date  10/03/18        PT Long Term Goals - 09/14/18 1221      PT LONG TERM GOAL #1   Title  Patient will describe pain no greater than 1/10 during Intercourse and ADL's to demonstrate improved  functional ability.    Baseline  Pain can increase to 9/10 with standing, sitting, sleeping, lifting    Time  10    Period  Weeks    Status  New    Target Date  11/21/18      PT LONG TERM GOAL #2   Title  Patient will demonstrate ability to perform self internal TP release in order to facilitate further PFM spasm reduction at home for faster resolution of symptoms.    Baseline  Pt. lacks knowledge of how to perform self internal TP release for decreased pain with intercourse.    Time  10    Period  Weeks    Status  New    Target Date  11/21/18      PT LONG TERM GOAL #3   Title  Patient will report no pain with intercourse to demonstrate improved functional ability.    Baseline  Pt is unable to tolerate intercourse due to pain and fear of vaginal penetration following cervical CA treatments.    Time  10    Period  Weeks    Status  New    Target Date  11/21/18      PT LONG TERM GOAL #4   Title  Patient will score at or below 24/75 on the [facility] to demonstrate a clinically significant decrease in disability and improved functional ability.    Baseline  [place]: 48/75    Time  10    Period  Weeks    Status  New    Target Date  11/21/18            Plan - 10/10/18 1634    Clinical Impression Statement  Pt. Pt. Responded well to all interventions today, demonstrating improved pelvic alignment with heel-lift, decreased pain and spasm in L hip, as well as understanding and correct performance of all education and exercises provided today. They will continue to benefit from skilled physical therapy to work toward remaining goals and maximize function as well as decrease likelihood of symptom increase or recurrence.    Personal Factors and Comorbidities  Comorbidity 3+;Finances    Comorbidities  Anxiety, depression, cancer, hashimoto's, hyperthyroid    Examination-Activity Limitations  Sleep;Lift;Squat;Bend;Sit;Stand;Stairs    Examination-Participation Restrictions  Interpersonal  Relationship;Church;Yard Work;Cleaning;Laundry;Community Activity;Other   use of dialator   Stability/Clinical Decision Making  Unstable/Unpredictable    Rehab Potential  Good    PT Frequency  1x / week    PT Duration  Other (comment)   10 weeks   PT Treatment/Interventions  ADLs/Self Care Home Management;Biofeedback;Aquatic Therapy;Traction;Functional mobility training;Gait training;Therapeutic activities;Therapeutic exercise;Neuromuscular re-education;Cognitive remediation;Manual techniques;Patient/family education;Scar mobilization;Passive range of motion;Dry needling;Taping;Energy conservation;Spinal Manipulations;Joint Manipulations    PT Next Visit Plan  L anterior rotation/ decreased mobility at sacrum? B mid-foot decreased mobility, give glute, TA, and hip ABD strengthening    PT Home Exercise Plan  lubricants/vaginal moisturizers, lymphedema massage, hip-flexor stretch, dilator use, Child's pose, pidgeon pose, frog stretch, side-stretch, vit E/coconut oil    Consulted and Agree with Plan of Care  Patient       Patient will benefit from skilled therapeutic intervention in order to improve the following deficits and impairments:  Decreased skin integrity, Increased fascial restricitons, Impaired sensation, Improper body mechanics, Pain, Decreased scar mobility, Hypermobility, Increased muscle spasms, Impaired tone, Postural dysfunction, Hypomobility, Decreased range of motion, Decreased strength, Increased edema, Impaired flexibility  Visit Diagnosis: 1. Other muscle spasm   2. Abnormal posture   3. Disorder of the skin and subcutaneous tissue related to radiation, unspecified        Problem List Patient Active Problem List   Diagnosis Date Noted  . Nausea without vomiting 04/25/2018  . Iron deficiency anemia 04/11/2018  . Goals of care, counseling/discussion 03/28/2018  . 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[place]) 03/23/2018  . Hypothyroidism due to Hashimoto's thyroiditis 03/18/2018  .  H/O fetal demise, not currently pregnant 03/18/2018  . Anxiety 09/05/2015  . Clinical depression 09/05/2015  . Big thyroid 09/05/2015  . Cannot sleep 09/05/2015  .  Adiposity 09/05/2015  . Nondependent opioid abuse in remission ([place]) 09/05/2015  . 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 Current tobacco use 09/05/2015   Willa Rough DPT, ATC Willa Rough 10/10/2018, 4:37 PM  [place] MAIN [facility] SERVICES [address] [place], [place], [address] Phone: [phone]   Fax:  [phone]  Name: Kristy Gordon MRN: [id number] Date of Birth: [birth year]

## 2018-10-10 NOTE — Patient Instructions (Signed)
   Hold for 30 seconds (5 deep breaths) and repeat 2-3 times on each side once a day   Flexors, Lunge  Hip Flexor Stretch: Proposal Pose    Maintain pelvic tuck under, lift pubic bone toward navel. Engage posterior hip muscles (firm glute muscles of leg in back position) and shift forward until you feel stretch on front of leg that is down. To increase stretch, maintain balance and ease hips forward. You may use one hand on a chair for balance if needed. Hold for __5__ breaths. Repeat __2-3__ times each leg.  Do _1-2__ times per day.  Pigeon pose for Piriformis stretch     With hands on floor, shoulder-width apart, bring right knee forward so that knee is facing to the right and sole of right foot is facing left. Extend left leg back. Tighten abdominal muscles to stabilize the spine. Hold position for _5__ breaths. Exhaling, switch legs and repeat. Repeat _2-3__ times for each side. Do _1-2__ times per day.   *Wear heel-lift in RIGHT shoe! for 1 hour day one, increase by 1 hour each day until you reach a full day's wear time. Then wear it as much as you can.   Child's Pose Pelvic Floor Lengthening    Sit in knee-chest position and reach arms forward. Separate knees for comfort. Hold position for _5__ breaths. Repeat _2-3__ times. Do _1-2__ times per day.   Take 5 deep breaths, allowing gravity to gently pull you into a deeper stretch with each breath. Repeat 2-3 times, once a day.   * Make vaginal suppositories out of coconut oil (1/2 cup) and vitamin E oil ( 1 capsule) and put them in a small mold in the fridge. Insert 1 nightly, you can also massage externally to damaged tissue.

## 2018-10-17 ENCOUNTER — Ambulatory Visit: Payer: Medicaid Other

## 2018-10-17 ENCOUNTER — Other Ambulatory Visit: Payer: Self-pay

## 2018-10-18 ENCOUNTER — Other Ambulatory Visit: Payer: Self-pay

## 2018-10-18 ENCOUNTER — Inpatient Hospital Stay: Payer: Medicaid Other | Attending: Nurse Practitioner | Admitting: Nurse Practitioner

## 2018-10-18 ENCOUNTER — Encounter: Payer: Self-pay | Admitting: Nurse Practitioner

## 2018-10-18 VITALS — BP 123/90 | HR 109 | Temp 99.0°F | Resp 18 | Wt 284.0 lb

## 2018-10-18 DIAGNOSIS — Z720 Tobacco use: Secondary | ICD-10-CM | POA: Insufficient documentation

## 2018-10-18 DIAGNOSIS — F4323 Adjustment disorder with mixed anxiety and depressed mood: Secondary | ICD-10-CM | POA: Insufficient documentation

## 2018-10-18 DIAGNOSIS — C539 Malignant neoplasm of cervix uteri, unspecified: Secondary | ICD-10-CM

## 2018-10-18 DIAGNOSIS — Z79890 Hormone replacement therapy (postmenopausal): Secondary | ICD-10-CM | POA: Insufficient documentation

## 2018-10-18 DIAGNOSIS — C778 Secondary and unspecified malignant neoplasm of lymph nodes of multiple regions: Secondary | ICD-10-CM | POA: Diagnosis not present

## 2018-10-18 DIAGNOSIS — E28319 Asymptomatic premature menopause: Secondary | ICD-10-CM | POA: Diagnosis not present

## 2018-10-18 DIAGNOSIS — F17200 Nicotine dependence, unspecified, uncomplicated: Secondary | ICD-10-CM

## 2018-10-18 MED ORDER — PROGESTERONE MICRONIZED 200 MG PO CAPS: ORAL_CAPSULE | ORAL | 0 refills | Status: DC

## 2018-10-18 MED ORDER — ESTRADIOL 0.075 MG/24HR TD PTTW: MEDICATED_PATCH | TRANSDERMAL | 0 refills | Status: DC

## 2018-10-18 NOTE — Progress Notes (Signed)
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  She has also been having irregular menstrual bleeding and spotting on and off for the last few months.She had an ultrasound pelvis done in the ER which showed a large 5.8 cm hypoechoic vascular mass within the cervix and the lower uterine segment.  Patient had last seen GYN in 2013 and had not had a Pap smear done since then.  Patient was seen by GYN Dr. Nechama Guard on 1213 and was found to have firm friable cervix and multiple biopsies were taken which showed adenocarcinoma.  She was then seen by Dr. Fransisca Connors from GYN oncology. Pelvic exam showed anteverted cervix that was replaced by tumor and extension of cancer into the left parametrium and possibly to the pelvic sidewall.    PET CT scan on 03/28/2018 showed 6 cm hypermetabolic cervical mass consistent with primary cervical carcinoma.  Mild hypermetabolic bilateral parametrial, right perirectal,  bilateral iliac lymph nodes,  consistent with metastatic disease.  No evidence of metastatic disease within the abdomen, chest, or neck.   Cycle 1 of weekly cisplatin and radiation started on 04/11/2018. She  completed chemo-radiation on 05/19/2018 followed by vaginal brachytherapy at [facility].  PET for restaging on 08/31/2018 showed clear interval response to therapy, cervix is decreased substantially in size with no substantial hypermetabolism today, interval resolution of the hypermetabolic pelvic lymphadenopathy seen previously.   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[place])  03/23/2018 Initial Diagnosis   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[place])   04/01/2018 Cancer Staging   Staging form: Cervix Uteri, AJCC 8th Edition - Clinical stage from 04/01/2018: FIGO Stage III (cT3, cN1, cM0) - Signed by Sindy Guadeloupe, MD on 04/01/2018   04/11/2018 -  Chemotherapy   The patient had palonosetron (ALOXI) injection 0.25 mg, 0.25 mg, Intravenous,  Once, 6 of 6 cycles Administration: 0.25 mg (04/11/2018), 0.25 mg (04/19/2018), 0.25 mg (04/27/2018), 0.25 mg (05/04/2018), 0.25 mg (05/12/2018), 0.25 mg (05/19/2018) CISplatin (PLATINOL) 90 mg in sodium chloride 0.9 % 250 mL chemo infusion, 40 mg/m2 = 90 mg, Intravenous,  Once, 6 of 6 cycles Administration: 90 mg (04/11/2018), 90 mg (04/27/2018), 90 mg (04/20/2018), 90 mg (05/04/2018), 90 mg (05/12/2018), 90 mg (05/19/2018) fosaprepitant (EMEND) 150 mg, dexamethasone (DECADRON) 12 mg in sodium chloride 0.9 % 145 mL IVPB, , Intravenous,  Once, 6 of 6 cycles Administration:  (04/11/2018),  (04/27/2018),  (04/20/2018),  (05/04/2018),  (05/12/2018),  (05/19/2018)  for chemotherapy treatment.      Interval history- Kristy Gordon, 33 year old female with above history of stage IIIc adenocarcinoma of the cervix with positive lymph node status post cisplatin and radiation completed 05/19/2018 followed by vaginal brachytherapy with excellent response based on PET, who presents to symptom management clinic for hot flashes despite HRT.  Also endorses night sweats,  insomnia, mood swings fatigue. Chronic but she feels slightly worse since treatment.  She was started on Activella (estradiol-norethindrone 1-0.5 mg tablet daily) in  February and has continued medication since that time. 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  Continues to have pain but feels it is improved.  She is still undergoing pelvic floor physical therapy and feels symptoms have improved but not yet resolved.  She still has her uterus.  Denies history of blood clots.  She says today that she has separated from her husband and plans divorce.  She says she has had significant emotional stress since her diagnosis and treatment.  She unfortunately continues to smoke and is interested in quitting.  Says she has cut back.  ECOG FS:1 - Symptomatic but completely ambulatory  Review of systems- Review of Systems  Reason unable to perform ROS: GYN- per hpi.  Constitutional: Negative for chills, fever, malaise/fatigue and weight loss.       Weight gain  HENT: Negative for hearing loss, nosebleeds, sore throat and tinnitus.   Eyes: Negative for blurred vision and double vision.  Respiratory: Negative for cough, hemoptysis, shortness of breath and wheezing.   Cardiovascular: Negative for chest pain, palpitations and leg swelling.  Gastrointestinal: Negative for abdominal pain, blood in stool, constipation, diarrhea, melena, nausea and vomiting.  Genitourinary: Negative for dysuria and urgency.  Musculoskeletal: Negative for back pain, falls, joint pain and myalgias.  Skin: Negative for itching and rash.  Neurological: Negative for dizziness, tingling, sensory change, loss of consciousness, weakness and headaches.  Endo/Heme/Allergies: Negative for environmental allergies. Does not bruise/bleed easily.       Per HPI  Psychiatric/Behavioral: Positive for depression. The patient is nervous/anxious and has insomnia.      Current treatment-surveillance  Allergies  Allergen Reactions  . Amoxicillin Rash  . Penicillins Hives    Past Medical History:  Diagnosis Date  . Anxiety   . Cancer (HCC)    cervical cancer  . Hashimoto's  thyroiditis   . Hypothyroid     Past Surgical History:  Procedure Laterality Date  . BUNIONECTOMY Right   . PORTA CATH INSERTION N/A 03/31/2018   Procedure: PORTA CATH INSERTION;  Surgeon: Algernon Huxley, MD;  Location: [facility];  Service: Cardiovascular;  Laterality: N/A;  . TONSILLECTOMY      Social History   Socioeconomic History  . Marital status: Married    Spouse name: Not on file  . Number of children: Not on file  . Years of education: Not on file  . Highest education level: Not on file  Occupational History  . Not on file  Social Needs  . Financial resource strain: Not on file  . Food insecurity    Worry: Not on file    Inability: Not on file  . Transportation needs    Medical: Not on file    Non-medical: Not on file  Tobacco Use  . Smoking status: Current Every Day Smoker    Packs/day: 0.15    Years: 10.00    Pack years: 1.50    Types: Cigarettes  . Smokeless tobacco: Never Used  . Tobacco comment: 3 cig a day and trying to stop  Substance and Sexual Activity  . Alcohol use: Yes    Comment: rare  . Drug use: Yes    Types: Marijuana    Comment: quit opiates 5 years ago  . Sexual activity: Yes    Birth control/protection: None  Lifestyle  .  Physical activity    Days per week: Not on file    Minutes per session: Not on file  . Stress: Not on file  Relationships  . Social Herbalist on phone: Not on file    Gets together: Not on file    Attends religious service: Not on file    Active member of club or organization: Not on file    Attends meetings of clubs or organizations: Not on file    Relationship status: Not on file  . Intimate partner violence    Fear of current or ex partner: Not on file    Emotionally abused: Not on file    Physically abused: Not on file    Forced sexual activity: Not on file  Other Topics Concern  . Not on file  Social History Narrative  . Not on file    Family History  Adopted: Yes  Family  history unknown: Yes     Current Outpatient Medications:  .  acetaminophen (TYLENOL) 500 MG tablet, Take 500 mg by mouth every 4 (four) hours as needed., Disp: , Rfl:  .  ALPRAZolam (XANAX) 1 MG tablet, TAKE ONE TABLET 3 TIMES DAILY AS NEEDED, Disp: 90 tablet, Rfl: 0 .  estradiol-norethindrone (ACTIVELLA) 1-0.5 MG tablet, Take 1 tablet by mouth daily. , Disp: , Rfl:  .  levothyroxine (SYNTHROID) 125 MCG tablet, TAKE 2 TABLETS(250 MCG) BY MOUTH DAILY, Disp: 180 tablet, Rfl: 0 .  lidocaine-prilocaine (EMLA) cream, Apply to affected area once (Patient not taking: Reported on 10/18/2018), Disp: 30 g, Rfl: 3 .  ondansetron (ZOFRAN) 8 MG tablet, TAKE 1 TABLET BY MOUTH TWICE DAILY AS NEEDED (START  ON  THE THIRD DAY  AFTER  CHEMOTHERAPY) (Patient not taking: Reported on 10/18/2018), Disp: 30 tablet, Rfl: 0 .  ondansetron (ZOFRAN-ODT) 4 MG disintegrating tablet, Take 1 tablet (4 mg total) by mouth every 6 (six) hours as needed for nausea or vomiting. (Patient not taking: Reported on 09/12/2018), Disp: 30 tablet, Rfl: 0 .  pantoprazole (PROTONIX) 20 MG tablet, Take 1 tablet (20 mg total) by mouth daily. (Patient not taking: Reported on 10/18/2018), Disp: 30 tablet, Rfl: 3 .  prochlorperazine (COMPAZINE) 10 MG tablet, TAKE 1 TABLET BY MOUTH EVERY 6 HOURS AS NEEDED FOR NAUSEA AND VOMITING (Patient not taking: Reported on 10/18/2018), Disp: 30 tablet, Rfl: 0 .  promethazine (PHENERGAN) 25 MG tablet, TAKE 1/2 TO 1 (ONE-HALF TO ONE) TABLET BY MOUTH EVERY 12 HOURS AS NEEDED FOR  REFRACTORY  NAUSEA  OR  VOMITING (Patient not taking: Reported on 10/18/2018), Disp: 30 tablet, Rfl: 0 No current facility-administered medications for this visit.   Facility-Administered Medications Ordered in Other Visits:  .  0.9 %  sodium chloride infusion, , Intravenous, Continuous, Sindy Guadeloupe, MD, Stopped at 04/15/18 1001 .  sodium chloride flush (NS) 0.9 % injection 10 mL, 10 mL, Intravenous, PRN, Sindy Guadeloupe, MD, 10 mL at  04/15/18 0825  Physical exam:  Vitals:   10/18/18 1013  BP: 123/90  Pulse: (!) 109  Resp: 18  Temp: 99 F (37.2 C)  TempSrc: Tympanic  Weight: 284 lb (128.8 kg)   Physical Exam Constitutional:      General: She is not in acute distress.    Comments: obese  HENT:     Head: Normocephalic and atraumatic.  Eyes:     Conjunctiva/sclera: Conjunctivae normal.     Pupils: Pupils are equal, round, and reactive to light.  Neck:  Musculoskeletal: Neck supple.  Cardiovascular:     Rate and Rhythm: Normal rate and regular rhythm.  Pulmonary:     Effort: Pulmonary effort is normal.     Breath sounds: Normal breath sounds.  Abdominal:     General: There is no distension.     Palpations: Abdomen is soft.     Tenderness: There is no abdominal tenderness.  Musculoskeletal: Normal range of motion.  Lymphadenopathy:     Cervical: No cervical adenopathy.  Skin:    General: Skin is warm and dry.  Neurological:     Mental Status: She is alert and oriented to person, place, and time.  Psychiatric:        Mood and Affect: Mood is anxious.        Behavior: Behavior normal.      CMP Latest Ref Rng & Units 09/06/2018  Glucose 70 - 99 mg/dL 119(H)  BUN 6 - 20 mg/dL 11  Creatinine 0.44 - 1.00 mg/dL 0.82  Sodium 135 - 145 mmol/L 137  Potassium 3.5 - 5.1 mmol/L 3.6  Chloride 98 - 111 mmol/L 105  CO2 22 - 32 mmol/L 23  Calcium 8.9 - 10.3 mg/dL 8.8(L)  Total Protein 6.5 - 8.1 g/dL 7.7  Total Bilirubin 0.3 - 1.2 mg/dL 0.3  Alkaline Phos 38 - 126 U/L 73  AST 15 - 41 U/L 23  ALT 0 - 44 U/L 19   CBC Latest Ref Rng & Units 09/06/2018  WBC 4.0 - 10.5 K/uL 5.4  Hemoglobin 12.0 - 15.0 g/dL 13.2  Hematocrit 36.0 - 46.0 % 38.7  Platelets 150 - 400 K/uL 348    No images are attached to the encounter.  No results found.  Assessment and plan- Patient is a 33 y.o. female with history of cervical cancer who presents to symptom management clinic for menopausal symptoms.  1.  Stage IIIc  cervical adenocarcinoma-status post primary chemoradiation with excellent response based on PET and clinically no evidence of disease on most recent pelvic exam.  Currently on surveillance.  No concerning symptoms today.  Continue surveillance and follow-up as scheduled.  2.  Premature menopause due to chemotherapy and radiation for treatment of cervical cancer- menopausal symptoms/premature menopause and poorly controlled vasomotor symptoms on current HRT/Activella regimen.  We had previously discussed IUD which was not an option due to vaginal agglutination.  Given smoking, I felt birth control pills were too high risk for VTE and I reached out to Dr. Gunnar Bulla, [facility]. She advised that large observational studies and a few RCTs have suggested that the patch form of estrogen does not demonstrate increased risk of VTE and that risk is greater with Activella/oral combination pill and highest with high estrogen birth control pills.  Based on this, she recommends transdermal estradiol 0.075 mg/24 hours.  Since patient has her uterus, she would require opposing progestin to prevent endometrial hyperplasia.  Risks versus benefit of hormone replacement therapy discussed in detail including risk of VTE, breast, ovarian, and endometrial cancers.  I also presented nonhormonal options for management however, given her age, I recommend HRT to protect from accelerated bone loss in the first 5-10 years of premature menopause.  She agreed to proceed with HRT.  - start Vivelle Dot 0.075 mg/q24 h every 3 days  - Start prometrium 200mg  capsule nightly 12 days of months. Take with bite of food for better absorption   3. Tobacco Use Disorder- I addressed the importance of smoking cessation with the patient in detail.  We  discussed the health benefits of cessation. We discussed the health detriments of ongoing tobacco use including but not limited to COPD, heart disease and malignancy as well as increased risk of VTE in setting  of HRT and cervical cancer.  We discussed that given co-morbidities it is especially important that she quit smoking.   We reviewed the multiple options for cessation and I offered that in my experience, starting the [facility] program followed by pharmacologic interventions has had the most success with documented success rates of > 65%. Today, she agrees to self-refer to [facility] and will discuss pharmacotherapy options with her PCP.   Greater than 10 minutes was spent in smoking cessation counseling with this patient.   4.  Adjustment disorder -chronic anxiety and depression with symptoms somewhat worse recently secondary to cancer diagnosis, treatment, and divorce.  Medication management by PCP.  We discussed counseling services available through the cancer center which are offered without cost.  I strongly encouraged her to reach out for appointment as this combination of psychotherapy and medication management is often very beneficial.  Review of her chart shows that primary care as previously recommended referral to psychiatry but due to lack of insurance she refused.  She now has Medicaid coverage so I encouraged her to discuss referral with PCP again.  Follow up with primary care for smoking cessation and referral to psychiatry. Return to clinic in 1 month for follow-up for HRT.  Visit Diagnosis 1. Premature menopause on hormone replacement therapy   2. Tobacco use disorder   3. Adjustment disorder with mixed anxiety and depressed mood     Patient expressed understanding and was in agreement with this plan. She also understands that She can call clinic at any time with any questions, concerns, or complaints.   Thank you for allowing me to participate in the care of this pleasant patient.   A total of (45) minutes of face-to-face time was spent with this patient with greater than 50% of that time in counseling and care-coordination.  Beckey Rutter, DNP, AGNP-[facility] (work cell) [phone] (office)  CC: Dr. Janese Banks, Dr. Theora Gianotti, Dr. Rosanna Randy

## 2018-10-18 NOTE — Patient Instructions (Signed)
Estradiol skin patches What is this medicine? ESTRADIOL (es tra DYE ole) skin patches contain an estrogen. It is mostly used as hormone replacement in menopausal women. It helps to treat hot flashes and prevent osteoporosis. It is also used to treat women with low estrogen levels or those who have had their ovaries removed. This medicine may be used for other purposes; ask your health care provider or pharmacist if you have questions. COMMON BRAND NAME(S): Alora, Climara, DOTTI, Esclim, Estraderm, FemPatch, Menostar, Minivelle, Vivelle, Vivelle-Dot What should I tell my health care provider before I take this medicine? They need to know if you have any of these conditions:  abnormal vaginal bleeding  blood vessel disease or blood clots  breast, cervical, endometrial, ovarian, liver, or uterine cancer  dementia  diabetes  gallbladder disease  heart disease or recent heart attack  high blood pressure  high cholesterol  high level of calcium in the blood  hysterectomy  kidney disease  liver disease  migraine headaches  protein C deficiency  protein S deficiency  stroke  systemic lupus erythematosus (SLE)  tobacco smoker  an unusual or allergic reaction to estrogens, other hormones, medicines, foods, dyes, or preservatives  pregnant or trying to get pregnant  breast-feeding How should I use this medicine? This medicine is for external use only. Follow the directions on the prescription label. Tear open the pouch, do not use scissors. Remove the stiff protective liner covering the adhesive. Try not to touch the adhesive. Apply the patch, sticky side to the skin, to an area that is clean, dry and hairless. Avoid injured, irritated, calloused, or scarred areas. Do not apply the skin patches to your breasts or around the waistline. Use a different site each time to prevent skin irritation. Do not cut or trim the patch. Do not stop using except on the advice of your doctor  or health care professional. Do not wear more than one patch at a time unless you are told to do so by your doctor or health care professional. Contact your pediatrician regarding the use of this medicine in children. Special care may be needed. A patient package insert for the product will be given with each prescription and refill. Read this sheet carefully each time. The sheet may change frequently. Overdosage: If you think you have taken too much of this medicine contact a poison control center or emergency room at once. NOTE: This medicine is only for you. Do not share this medicine with others. What if I miss a dose? If you miss a dose, apply it as soon as you can. If it is almost time for your next dose, apply only that dose. Do not apply double or extra doses. What may interact with this medicine? Do not take this medicine with any of the following medications:  aromatase inhibitors like aminoglutethimide, anastrozole, exemestane, letrozole, testolactone This medicine may also interact with the following medications:  carbamazepine  certain antibiotics used to treat infections  certain barbiturates used for inducing sleep or treating seizures  grapefruit juice  medicines for fungus infections like itraconazole and ketoconazole  raloxifene or tamoxifen  rifabutin, rifampin, or rifapentine  ritonavir  St. John's Wort This list may not describe all possible interactions. Give your health care provider a list of all the medicines, herbs, non-prescription drugs, or dietary supplements you use. Also tell them if you smoke, drink alcohol, or use illegal drugs. Some items may interact with your medicine. What should I watch for while using this  medicine? Visit your doctor or health care professional for regular checks on your progress. You will need a regular breast and pelvic exam and Pap smear while on this medicine. You should also discuss the need for regular mammograms with your  health care professional, and follow his or her guidelines for these tests. This medicine can make your body retain fluid, making your fingers, hands, or ankles swell. Your blood pressure can go up. Contact your doctor or health care professional if you feel you are retaining fluid. If you have any reason to think you are pregnant, stop taking this medicine right away and contact your doctor or health care professional. Smoking increases the risk of getting a blood clot or having a stroke while you are taking this medicine, especially if you are more than 32 years old. You are strongly advised not to smoke. If you wear contact lenses and notice visual changes, or if the lenses begin to feel uncomfortable, consult your eye doctor or health care professional. This medicine can increase the risk of developing a condition (endometrial hyperplasia) that may lead to cancer of the lining of the uterus. Taking progestins, another hormone drug, with this medicine lowers the risk of developing this condition. Therefore, if your uterus has not been removed (by a hysterectomy), your doctor may prescribe a progestin for you to take together with your estrogen. You should know, however, that taking estrogens with progestins may have additional health risks. You should discuss the use of estrogens and progestins with your health care professional to determine the benefits and risks for you. If you are going to have surgery or an MRI, you may need to stop taking this medicine. Consult your health care professional for advice before you schedule the surgery. Contact with water while you are swimming, using a sauna, bathing, or showering may cause the patch to fall off. If your patch falls off reapply it. If you cannot reapply the patch, apply a new patch to another area and continue to follow your usual dose schedule. What side effects may I notice from receiving this medicine? Side effects that you should report to your  doctor or health care professional as soon as possible:  allergic reactions like skin rash, itching or hives, swelling of the face, lips, or tongue  breast tissue changes or discharge  changes in vision  chest pain  confusion, trouble speaking or understanding  dark urine  general ill feeling or flu-like symptoms  light-colored stools  nausea, vomiting  pain, swelling, warmth in the leg  right upper belly pain  severe headaches  shortness of breath  sudden numbness or weakness of the face, arm or leg  trouble walking, dizziness, loss of balance or coordination  unusual vaginal bleeding  yellowing of the eyes or skin Side effects that usually do not require medical attention (report to your doctor or health care professional if they continue or are bothersome):  hair loss  increased hunger or thirst  increased urination  symptoms of vaginal infection like itching, irritation or unusual discharge  unusually weak or tired This list may not describe all possible side effects. Call your doctor for medical advice about side effects. You may report side effects to FDA at 1-800-FDA-1088. Where should I keep my medicine? Keep out of the reach of children. Store at room temperature below 30 degrees C (86 degrees F). Do not store any patches that have been removed from their protective pouch. Throw away any unused medicine after the  expiration date. Dispose of used patches properly. Since used patches may still contain active hormones, fold the patch in half so that it sticks to itself prior to disposal. NOTE: This sheet is a summary. It may not cover all possible information. If you have questions about this medicine, talk to your doctor, pharmacist, or health care provider.  2020 Elsevier/Gold Standard (2015-10-15 12:58:11) Progesterone capsules What is this medicine? PROGESTERONE (proe JES ter one) is a female hormone. This medicine is used to prevent the overgrowth of  the lining of the uterus in women who are taking estrogens for the symptoms of menopause. It is also used to treat secondary amenorrhea. This is when a woman stops getting menstrual periods due to low levels of progesterone. This medicine may be used for other purposes; ask your health care provider or pharmacist if you have questions. COMMON BRAND NAME(S): Prometrium What should I tell my health care provider before I take this medicine? They need to know if you have any of these conditions:  autoimmune disease like systemic lupus erythematosus (SLE)  blood vessel disease, blood clotting disorder, or suffered a stroke  breast, cervical or vaginal cancer  dementia  diabetes  kidney or liver disease  heart disease, high blood pressure or recent heart attack  high blood lipids or cholesterol  hysterectomy  recent miscarriage  tobacco smoker  vaginal bleeding  an unusual or allergic reaction to progesterone, peanuts, other medicines, foods, dyes, or preservatives  pregnant or trying to get pregnant  breast-feeding How should I use this medicine? Take this medicine by mouth with a glass of water. Follow the directions on the prescription label. Take your doses at regular intervals. Do not take your medicine more often than directed. Talk to your pediatrician regarding the use of this medicine in children. Special care may be needed. A patient package insert for the product will be given with each prescription and refill. Read this sheet carefully each time. The sheet may change frequently. Overdosage: If you think you have taken too much of this medicine contact a poison control center or emergency room at once. NOTE: This medicine is only for you. Do not share this medicine with others. What if I miss a dose? If you miss a dose, take it as soon as you can. If it is almost time for your next dose, take only that dose. Do not take double or extra doses. What may interact with  this medicine? Do not take this medicine with any of the following medications:  bosentan This medicine may also interact with the following medications:  barbiturate medicines for sleep or seizures  bexarotene  carbamazepine  ethotoin  ketoconazole  phenytoin  rifampin This list may not describe all possible interactions. Give your health care provider a list of all the medicines, herbs, non-prescription drugs, or dietary supplements you use. Also tell them if you smoke, drink alcohol, or use illegal drugs. Some items may interact with your medicine. What should I watch for while using this medicine? Visit your doctor or health care professional for regular checks on your progress. This medicine can cause swelling, tenderness, or bleeding of the gums. Be careful when brushing and flossing teeth. See your dentist regularly for routine dental care. You may get drowsy or dizzy. Do not drive, use machinery, or do anything that needs mental alertness until you know how this drug affects you. Do not stand or sit up quickly, especially if you are an older patient. This reduces the  risk of dizzy or fainting spells. What side effects may I notice from receiving this medicine? Side effects that you should report to your doctor or health care professional as soon as possible:  allergic reactions like skin rash, itching or hives, swelling of the face, lips, or tongue  breast tissue changes or discharge  changes in vaginal bleeding during your period or between your periods  depression  muscle or bone pain  numbness or pain in the arm or leg  pain in the chest, groin or leg  seizures or tremors  severe headache  stomach pain  sudden shortness of breath  unusually weak or tired  vision or speech problems  yellowing of skin or eyes Side effects that usually do not require medical attention (report to your doctor or health care professional if they continue or are  bothersome):  acne  fluid retention and swelling  increased in appetite  mood changes, anxiety, depression, frustration, anger, or emotional outbursts  nausea, vomiting  sweating or hot flashes This list may not describe all possible side effects. Call your doctor for medical advice about side effects. You may report side effects to FDA at 1-800-FDA-1088. Where should I keep my medicine? Keep out of the reach of children. Store at room temperature between 15 and 30 degrees C (59 and 86 degrees F). Protect from light. Keep container tightly closed. Throw away any unused medicine after the expiration date. NOTE: This sheet is a summary. It may not cover all possible information. If you have questions about this medicine, talk to your doctor, pharmacist, or health care provider.  2020 Elsevier/Gold Standard (2008-03-08 11:43:48)

## 2018-10-20 DIAGNOSIS — Z79890 Hormone replacement therapy (postmenopausal): Secondary | ICD-10-CM | POA: Insufficient documentation

## 2018-10-20 DIAGNOSIS — E28319 Asymptomatic premature menopause: Secondary | ICD-10-CM | POA: Insufficient documentation

## 2018-10-20 DIAGNOSIS — F4323 Adjustment disorder with mixed anxiety and depressed mood: Secondary | ICD-10-CM | POA: Insufficient documentation

## 2018-10-24 ENCOUNTER — Other Ambulatory Visit: Payer: Self-pay

## 2018-10-24 ENCOUNTER — Ambulatory Visit: Payer: Medicaid Other

## 2018-10-24 DIAGNOSIS — M62838 Other muscle spasm: Secondary | ICD-10-CM | POA: Diagnosis not present

## 2018-10-24 DIAGNOSIS — R293 Abnormal posture: Secondary | ICD-10-CM

## 2018-10-24 DIAGNOSIS — L599 Disorder of the skin and subcutaneous tissue related to radiation, unspecified: Secondary | ICD-10-CM

## 2018-10-24 NOTE — Therapy (Signed)
[facility] [address] [place], [place], [address] Phone: [phone]   Fax:  [phone]  Physical Therapy Treatment  The patient has been informed of current processes in place at Outpatient Rehab to protect patients from Covid-19 exposure including social distancing, schedule modifications, and new cleaning procedures. After discussing their particular risk with a therapist based on the patient's personal risk factors, the patient has decided to proceed with in-person therapy.   Patient Details  Name: Kristy Gordon MRN: [id number] Date of Birth: 12/21/1985 Referring Provider (PT): Allen, Lauren   Encounter Date: 10/24/2018  PT End of Session - 10/24/18 1410    Visit Number  4    Number of Visits  10    Date for PT Re-Evaluation  11/21/18    Authorization Type  [employer]    Authorization Time Period  12/04/18    Authorization - Visit Number  4    Authorization - Number of Visits  10    PT Start Time  1230    PT Stop Time  1330    PT Time Calculation (min)  60 min    Activity Tolerance  Patient tolerated treatment well;No increased pain    Behavior During Therapy  WFL for tasks assessed/performed       Past Medical History:  Diagnosis Date  . Anxiety   . Cancer (HCC)    cervical cancer  . Hashimoto's thyroiditis   . Hypothyroid     Past Surgical History:  Procedure Laterality Date  . BUNIONECTOMY Right   . PORTA CATH INSERTION N/A 03/31/2018   Procedure: PORTA CATH INSERTION;  Surgeon: Dew, Jason S, MD;  Location: [facility] IN[facility]IVE [facility];  Service: Cardiovascular;  Laterality: N/A;  . TONSILLECTOMY      There were no vitals filed for this visit.    Pelvic Floor Physical Therapy Treatment Note  SCREENING  Changes in medications, allergies, or medical history?: Hormones have changed, using a patch and pills    SUBJECTIVE  Patient reports: She Has been doing her stretches and yogy  Precautions:   Cervical CA  Pain update:  Location of pain: LLB/SIJ Current pain:  2/10  Max pain:  4/10 Least pain:  0/10 Nature of pain: pinchy  Patient Goals: Have sex without pain and not have pain in the hip all the time.   OBJECTIVE  Changes in: Posture/Observations:   L up-slip pre-treatment, resolved following.  Range of Motion/Flexibilty:  Decreased hip ext on L>R  Palpation: TTP to L Iliacus, Lumbar paraspinal and multifidus.  INTERVENTIONS THIS SESSION: Manual: Performed TP release to L lumbar paraspinals and Iliacus followed by PA mobs to L sacral base and L up-slip correction to decrease spasm and pain and allow for improved balance of musculature for improved function and decreased symptoms and to improve mobility of joint and surrounding connective tissue and decrease pressure on nerve roots for improved conductivity and function of down-stream tissues.  Therex: Educated on and practiced knee-plank, malasana, hip-flexor hold-relax and stretch, and chair pose to improve strength of muscles opposing tight musculature to allow reciprocal inhibition to improve balance of musculature surrounding the pelvis and improve overall posture for optimal musculature length-tension relationship and function.   Total time: 60 min.                            PT Short Term Goals - 10/04/18 0904      PT   SHORT TERM GOAL #1   Title  Patient will demonstrate improved pelvic alignment and balance of musculature surrounding the pelvis to facilitate decreased PFM spasms and decrease pelvic pain.    Baseline  R anterior rotation and spasms surrounding pelvis. As of 6/29: decreased hip-flexor spasms and improved pelvic alignment by ~ 50%    Time  3    Period  Weeks    Status  Partially Met    Target Date  10/03/18      PT SHORT TERM GOAL #2   Title  Pt. will describe successful implementation of dialator use by progressing by 1 dilator size to begin improving tissue  extensibility for decreased pain with intercourse.    Baseline  Pt. is unable to  fully insert ~ size #4 dilator given to her by MD. Pt. using dilators regularly but unable to progress yet due to pain, tissue fragility, and bleeding    Time  3    Period  Weeks    Status  On-going    Target Date  10/03/18      PT SHORT TERM GOAL #3   Title  Pt. will successfully implement vaginal moisturizer and massage program to improve vaginal tissue health and decrease pain with dilator use and intercourse.    Baseline  Pt. lacks knowleddge of how/when/what to use to improve vaginal tissue health for decreased pain.    Time  3    Period  Weeks    Status  Achieved    Target Date  10/03/18      PT SHORT TERM GOAL #4   Title  Patient will demonstrate HEP x1 in the clinic to demonstrate understanding and proper form to allow for further improvement.    Baseline  Pt. lacks knowledge of therapeutic exercises that can help decrease her pain.    Time  3    Period  Weeks    Status  Achieved    Target Date  10/03/18        PT Long Term Goals - 09/14/18 1221      PT LONG TERM GOAL #1   Title  Patient will describe pain no greater than 1/10 during Intercourse and ADL's to demonstrate improved functional ability.    Baseline  Pain can increase to 9/10 with standing, sitting, sleeping, lifting    Time  10    Period  Weeks    Status  New    Target Date  11/21/18      PT LONG TERM GOAL #2   Title  Patient will demonstrate ability to perform self internal TP release in order to facilitate further PFM spasm reduction at home for faster resolution of symptoms.    Baseline  Pt. lacks knowledge of how to perform self internal TP release for decreased pain with intercourse.    Time  10    Period  Weeks    Status  New    Target Date  11/21/18      PT LONG TERM GOAL #3   Title  Patient will report no pain with intercourse to demonstrate improved functional ability.    Baseline  Pt is unable to tolerate  intercourse due to pain and fear of vaginal penetration following cervical CA treatments.    Time  10    Period  Weeks    Status  New    Target Date  11/21/18      PT LONG TERM GOAL #4   Title  Patient will score   at or below 24/75 on the PDI to demonstrate a clinically significant decrease in disability and improved functional ability.    Baseline  PDI: 48/75    Time  10    Period  Weeks    Status  New    Target Date  11/21/18            Plan - 10/24/18 1410    Clinical Impression Statement  Pt. Responded well to all interventions today, demonstrating improved pelvic alignment, decreased spasms and pain, as well as understanding and correct performance of all education and exercises provided today. They will continue to benefit from skilled physical therapy to work toward remaining goals and maximize function as well as decrease likelihood of symptom increase or recurrence.     PT Next Visit Plan  B mid-foot decreased mobility, give glute, TA, and hip ABD strengthening, following session ?internal    PT Home Exercise Plan  lubricants/vaginal moisturizers, lymphedema massage, hip-flexor stretch, dilator use, Child's pose, pidgeon pose, frog stretch, side-stretch, vit E/coconut oil, chair pose, knee plank, malasana (deep squat)    Consulted and Agree with Plan of Care  Patient       Patient will benefit from skilled therapeutic intervention in order to improve the following deficits and impairments:     Visit Diagnosis: 1. Other muscle spasm   2. Abnormal posture   3. Disorder of the skin and subcutaneous tissue related to radiation, unspecified        Problem List Patient Active Problem List   Diagnosis Date Noted  . Premature menopause on hormone replacement therapy 10/20/2018  . Adjustment disorder with mixed anxiety and depressed mood 10/20/2018  . Nausea without vomiting 04/25/2018  . Iron deficiency anemia 04/11/2018  . Goals of care, counseling/discussion  03/28/2018  . 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HCC) 03/23/2018  . Hypothyroidism due to Hashimoto's thyroiditis 03/18/2018  . H/O fetal demise, not currently pregnant 03/18/2018  . Anxiety 09/05/2015  . Clinical depression 09/05/2015  . Big thyroid 09/05/2015  . Cannot sleep 09/05/2015  . Adiposity 09/05/2015  . 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 Tobacco use disorder 09/05/2015    T.  DPT, ATC  T  10/24/2018, 2:17 PM  [facility] [address] [place], [place], [address] Phone: [phone]   Fax:  [phone]  Name: Kristy Gordon MRN: [id number] Date of Birth: 02/06/1986   

## 2018-10-24 NOTE — Patient Instructions (Signed)
   Gently lift the knee ~ 1 cm and hold for 5 sec. Then release, repeat 5 times on each side. THEN  Bring both knees up to your chest and then hold the one farthest from the edge of the table/bed and let the other relax toward the floor until stretch is felt through the front of the hip.  Hold for __5__ deep belly breaths. Relax. Repeat __2-3__ times per side.   Do this __1-2__ times per day.      Start on all fours, tuck your hips under slightly and come forward until you are in a straight line. Make sure that your knees, your hips, and your shoulders are in a straight line. Hold for ~ 30 seconds, (or until you feel like you are too tired to hold it correctly). Repeat 2-3 times (or up to ~ 1:30 sec. Total).   As you get stronger, you can lengthen each hold. When you can hold for ~ 1:30 straight, try switching to on your toes instead of knees. You will need to decrease your hold time initially and build back up to the full 1:30.

## 2018-10-31 ENCOUNTER — Ambulatory Visit: Payer: Medicaid Other

## 2018-11-07 ENCOUNTER — Ambulatory Visit: Payer: Medicaid Other | Attending: Nurse Practitioner

## 2018-11-07 DIAGNOSIS — M62838 Other muscle spasm: Secondary | ICD-10-CM | POA: Insufficient documentation

## 2018-11-07 DIAGNOSIS — R293 Abnormal posture: Secondary | ICD-10-CM | POA: Insufficient documentation

## 2018-11-07 DIAGNOSIS — L599 Disorder of the skin and subcutaneous tissue related to radiation, unspecified: Secondary | ICD-10-CM | POA: Insufficient documentation

## 2018-11-08 ENCOUNTER — Encounter: Payer: Self-pay | Admitting: Family Medicine

## 2018-11-08 ENCOUNTER — Other Ambulatory Visit: Payer: Self-pay

## 2018-11-08 ENCOUNTER — Ambulatory Visit: Payer: Medicaid Other | Admitting: Family Medicine

## 2018-11-08 VITALS — BP 122/80 | HR 96 | Temp 98.7°F | Resp 19 | Ht 67.0 in | Wt 290.0 lb

## 2018-11-08 DIAGNOSIS — Z6841 Body Mass Index (BMI) 40.0 and over, adult: Secondary | ICD-10-CM

## 2018-11-08 DIAGNOSIS — F419 Anxiety disorder, unspecified: Secondary | ICD-10-CM | POA: Diagnosis not present

## 2018-11-08 DIAGNOSIS — C539 Malignant neoplasm of cervix uteri, unspecified: Secondary | ICD-10-CM

## 2018-11-08 DIAGNOSIS — E038 Other specified hypothyroidism: Secondary | ICD-10-CM

## 2018-11-08 NOTE — Progress Notes (Signed)
Patient: Kristy Gordon Female    DOB: [birth year]   33 y.o.   MRN: [id number] Visit Date: 11/08/2018  Today's Provider: Wilhemena Durie, MD   Chief Complaint  Patient presents with  . Follow-up  . Hypothyroidism   Subjective:    HPI Patient comes in today for a follow up. She was last seen in the office 6 months ago. No medications were changed since last visit.  She has had metastatic cervical cancer requiring TAH,radiation,and chemotherapy. Her Mother is doing well in retirement. Pt is now separated from her husband. BP Readings from Last 3 Encounters:  11/08/18 122/80  10/18/18 123/90  09/07/18 (!) 136/91   Wt Readings from Last 3 Encounters:  11/08/18 290 lb (131.5 kg)  10/18/18 284 lb (128.8 kg)  09/07/18 271 lb 3.2 oz (123 kg)    Hyperthyroidism-  Patient is currently taking levothyroxine 147mcg daily. She reports good compliance and good symptom control.  Lab Results  Component Value Date   TSH 9.260 (H) 02/08/2018    Allergies  Allergen Reactions  . Amoxicillin Rash  . Penicillins Hives     Current Outpatient Medications:  .  acetaminophen (TYLENOL) 500 MG tablet, Take 500 mg by mouth every 4 (four) hours as needed., Disp: , Rfl:  .  ALPRAZolam (XANAX) 1 MG tablet, TAKE ONE TABLET 3 TIMES DAILY AS NEEDED, Disp: 90 tablet, Rfl: 0 .  estradiol (VIVELLE-DOT) 0.075 MG/24HR, Apply 0.075 mg patch to skin of abdomen or buttock twice weekly., Disp: 8 patch, Rfl: 0 .  levothyroxine (SYNTHROID) 125 MCG tablet, TAKE 2 TABLETS(250 MCG) BY MOUTH DAILY, Disp: 180 tablet, Rfl: 0 .  progesterone (PROMETRIUM) 200 MG capsule, Take 200 mg by mouth at bedtime with food for 12 days sequentially per 28 day cycle., Disp: 12 capsule, Rfl: 0 .  lidocaine-prilocaine (EMLA) cream, Apply to affected area once (Patient not taking: Reported on 10/18/2018), Disp: 30 g, Rfl: 3 .  pantoprazole (PROTONIX) 20 MG tablet, Take 1 tablet (20 mg total) by mouth daily. (Patient not taking:  Reported on 10/18/2018), Disp: 30 tablet, Rfl: 3 No current facility-administered medications for this visit.   Facility-Administered Medications Ordered in Other Visits:  .  sodium chloride flush (NS) 0.9 % injection 10 mL, 10 mL, Intravenous, PRN, Sindy Guadeloupe, MD, 10 mL at 04/15/18 0825  Review of Systems  Constitutional: Negative for activity change and fatigue.  Respiratory: Negative for cough and shortness of breath.   Cardiovascular: Negative for chest pain and palpitations.  Endocrine: Negative for cold intolerance, heat intolerance, polydipsia, polyphagia and polyuria.  Allergic/Immunologic: Negative for environmental allergies.  Neurological: Negative for dizziness and headaches.  Psychiatric/Behavioral: Negative for agitation, self-injury, sleep disturbance and suicidal ideas. The patient is not nervous/anxious.     Social History   Tobacco Use  . Smoking status: Current Every Day Smoker    Packs/day: 0.15    Years: 10.00    Pack years: 1.50    Types: Cigarettes  . Smokeless tobacco: Never Used  . Tobacco comment: 3 cig a day and trying to stop  Substance Use Topics  . Alcohol use: Yes    Comment: rare      Objective:   BP 122/80   Pulse 96   Temp 98.7 F (37.1 C)   Resp 19   Ht 5\' 7"  (1.702 m)   Wt 290 lb (131.5 kg)   SpO2 98%   BMI 45.42 kg/m  Vitals:   11/08/18  0851  BP: 122/80  Pulse: 96  Resp: 19  Temp: 98.7 F (37.1 C)  SpO2: 98%  Weight: 290 lb (131.5 kg)  Height: 5\' 7"  (1.702 m)     Physical Exam Vitals signs reviewed.  Constitutional:      Appearance: She is obese.  HENT:     Right Ear: External ear normal.     Left Ear: External ear normal.  Eyes:     General: No scleral icterus. Neck:     Comments: Goiter present.,stable. Cardiovascular:     Rate and Rhythm: Normal rate and regular rhythm.     Heart sounds: Normal heart sounds.  Pulmonary:     Effort: Pulmonary effort is normal.     Breath sounds: Normal breath sounds.   Abdominal:     Palpations: Abdomen is soft.  Skin:    General: Skin is warm and dry.  Neurological:     General: No focal deficit present.     Mental Status: She is alert and oriented to person, place, and time.  Psychiatric:        Mood and Affect: Mood normal.        Behavior: Behavior normal.        Thought Content: Thought content normal.        Judgment: Judgment normal.      No results found for any visits on 11/08/18.     Assessment & Plan     1. Other specified hypothyroidism  - TSH  2. Class 3 severe obesity due to excess calories without serious comorbidity with body mass index (BMI) of 45.0 to 49.9 in adult ([place])   3. 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[place])  4.Chronic Anxiety   I, Rachelle L. Presley, CMA, am acting as a Education administrator for [employer]. Rosanna Randy, [place]   Wilhemena Durie, MD  [facility]

## 2018-11-09 ENCOUNTER — Other Ambulatory Visit: Payer: Self-pay | Admitting: Family Medicine

## 2018-11-09 ENCOUNTER — Telehealth: Payer: Self-pay

## 2018-11-09 DIAGNOSIS — E038 Other specified hypothyroidism: Secondary | ICD-10-CM

## 2018-11-09 LAB — TSH: TSH: 7.97 u[IU]/mL — ABNORMAL HIGH (ref 0.450–4.500)

## 2018-11-09 MED ORDER — LEVOTHYROXINE SODIUM 175 MCG PO TABS: 175.0000 ug | ORAL_TABLET | Freq: Every day | ORAL | 1 refills | Status: DC

## 2018-11-09 NOTE — Telephone Encounter (Signed)
Patient notified of lab results

## 2018-11-09 NOTE — Telephone Encounter (Signed)
-----   Message from [name], MD sent at 11/09/2018  8:04 AM EDT ----- Increase levothyroxine from 125 to 175 mcg daily

## 2018-11-09 NOTE — Telephone Encounter (Signed)
LVMTCB regarding lab results.  

## 2018-11-11 ENCOUNTER — Other Ambulatory Visit: Payer: Medicaid Other

## 2018-11-11 ENCOUNTER — Other Ambulatory Visit: Payer: Self-pay

## 2018-11-14 ENCOUNTER — Encounter: Payer: Self-pay | Admitting: Nurse Practitioner

## 2018-11-14 ENCOUNTER — Inpatient Hospital Stay: Payer: Medicaid Other

## 2018-11-14 ENCOUNTER — Other Ambulatory Visit: Payer: Self-pay

## 2018-11-14 ENCOUNTER — Inpatient Hospital Stay: Payer: Medicaid Other | Attending: Nurse Practitioner | Admitting: Nurse Practitioner

## 2018-11-14 ENCOUNTER — Ambulatory Visit: Payer: Medicaid Other

## 2018-11-14 VITALS — BP 125/93 | HR 103 | Temp 99.0°F | Resp 18

## 2018-11-14 DIAGNOSIS — L599 Disorder of the skin and subcutaneous tissue related to radiation, unspecified: Secondary | ICD-10-CM

## 2018-11-14 DIAGNOSIS — F4323 Adjustment disorder with mixed anxiety and depressed mood: Secondary | ICD-10-CM | POA: Diagnosis not present

## 2018-11-14 DIAGNOSIS — Z95828 Presence of other vascular implants and grafts: Secondary | ICD-10-CM

## 2018-11-14 DIAGNOSIS — C539 Malignant neoplasm of cervix uteri, unspecified: Secondary | ICD-10-CM | POA: Diagnosis not present

## 2018-11-14 DIAGNOSIS — Z720 Tobacco use: Secondary | ICD-10-CM | POA: Insufficient documentation

## 2018-11-14 DIAGNOSIS — N951 Menopausal and female climacteric states: Secondary | ICD-10-CM | POA: Diagnosis not present

## 2018-11-14 DIAGNOSIS — E28319 Asymptomatic premature menopause: Secondary | ICD-10-CM | POA: Diagnosis not present

## 2018-11-14 DIAGNOSIS — M62838 Other muscle spasm: Secondary | ICD-10-CM | POA: Diagnosis not present

## 2018-11-14 DIAGNOSIS — E669 Obesity, unspecified: Secondary | ICD-10-CM | POA: Insufficient documentation

## 2018-11-14 DIAGNOSIS — R293 Abnormal posture: Secondary | ICD-10-CM

## 2018-11-14 DIAGNOSIS — R1032 Left lower quadrant pain: Secondary | ICD-10-CM | POA: Diagnosis not present

## 2018-11-14 DIAGNOSIS — M545 Low back pain: Secondary | ICD-10-CM | POA: Insufficient documentation

## 2018-11-14 DIAGNOSIS — Z79890 Hormone replacement therapy (postmenopausal): Secondary | ICD-10-CM | POA: Diagnosis not present

## 2018-11-14 LAB — CBC WITH DIFFERENTIAL/PLATELET
Abs Immature Granulocytes: 0.02 10*3/uL (ref 0.00–0.07)
Basophils Absolute: 0.1 10*3/uL (ref 0.0–0.1)
Basophils Relative: 1 %
Eosinophils Absolute: 0.1 10*3/uL (ref 0.0–0.5)
Eosinophils Relative: 3 %
HCT: 36.5 % (ref 36.0–46.0)
Hemoglobin: 12.8 g/dL (ref 12.0–15.0)
Immature Granulocytes: 0 %
Lymphocytes Relative: 30 %
Lymphs Abs: 1.6 10*3/uL (ref 0.7–4.0)
MCH: 32.4 pg (ref 26.0–34.0)
MCHC: 35.1 g/dL (ref 30.0–36.0)
MCV: 92.4 fL (ref 80.0–100.0)
Monocytes Absolute: 0.7 10*3/uL (ref 0.1–1.0)
Monocytes Relative: 13 %
Neutro Abs: 2.8 10*3/uL (ref 1.7–7.7)
Neutrophils Relative %: 53 %
Platelets: 367 10*3/uL (ref 150–400)
RBC: 3.95 MIL/uL (ref 3.87–5.11)
RDW: 12.5 % (ref 11.5–15.5)
WBC: 5.3 10*3/uL (ref 4.0–10.5)
nRBC: 0 % (ref 0.0–0.2)

## 2018-11-14 LAB — COMPREHENSIVE METABOLIC PANEL
ALT: 36 U/L (ref 0–44)
AST: 30 U/L (ref 15–41)
Albumin: 3.8 g/dL (ref 3.5–5.0)
Alkaline Phosphatase: 79 U/L (ref 38–126)
Anion gap: 8 (ref 5–15)
BUN: 10 mg/dL (ref 6–20)
CO2: 25 mmol/L (ref 22–32)
Calcium: 9.2 mg/dL (ref 8.9–10.3)
Chloride: 103 mmol/L (ref 98–111)
Creatinine, Ser: 0.74 mg/dL (ref 0.44–1.00)
GFR calc Af Amer: >60 mL/min (ref >60)
GFR calc non Af Amer: >60 mL/min (ref >60)
Glucose, Bld: 103 mg/dL — ABNORMAL HIGH (ref 70–99)
Potassium: 3.9 mmol/L (ref 3.5–5.1)
Sodium: 136 mmol/L (ref 135–145)
Total Bilirubin: 0.3 mg/dL (ref 0.3–1.2)
Total Protein: 7.9 g/dL (ref 6.5–8.1)

## 2018-11-14 MED ORDER — ESTRADIOL 0.075 MG/24HR TD PTTW: MEDICATED_PATCH | TRANSDERMAL | 11 refills | Status: DC

## 2018-11-14 MED ORDER — PROGESTERONE MICRONIZED 200 MG PO CAPS: ORAL_CAPSULE | ORAL | 11 refills | Status: DC

## 2018-11-14 NOTE — Patient Instructions (Signed)
Pelvic Tilt With Pelvic Floor (Hook-Lying)        Lie with hips and knees bent. Squeeze pelvic floor and flatten low back while breathing out so that pelvis tilts. Repeat _10x2__ times. Do _1-2__ times a day.    Move the foam roller or towel up and down to a few spots in the upper back, repeating the process.     Sit at the end of the foam roller or towel roll and lie back with your spine along the length of it. Bring your arms out to the side, palms up, and take _10__ belly breaths.  Next, slide arms up overhead doing a "snow angel" motion as you breathe in and down toward your hips as you breathe out. Do this for _10__ belly breaths.

## 2018-11-14 NOTE — Therapy (Signed)
[place] MAIN [facility] SERVICES [address] [place], [place], [address] Phone: [phone]   Fax:  [phone]  Physical Therapy Treatment  The patient has been informed of current processes in place at Outpatient Rehab to protect patients from Covid-19 exposure including social distancing, schedule modifications, and new cleaning procedures. After discussing their particular risk with a therapist based on the patient's personal risk factors, the patient has decided to proceed with in-person therapy.   Patient Details  Name: Kristy Gordon MRN: [id number] Date of Birth: 07-31-1985 Referring Provider (PT): Beckey Rutter   Encounter Date: 11/14/2018  PT End of Session - 11/14/18 1504    Visit Number  5    Number of Visits  10    Date for PT Re-Evaluation  11/21/18    Authorization Type  [employer]CA[employer]    Authorization Time Period  12/04/18    Authorization - Visit Number  5    Authorization - Number of Visits  10    PT Start Time  8850    PT Stop Time  1330    PT Time Calculation (min)  55 min    Activity Tolerance  Patient tolerated treatment well;No increased pain    Behavior During Therapy  WFL for tasks assessed/performed       Past Medical History:  Diagnosis Date  . Anxiety   . Cancer (HCC)    cervical cancer  . Hashimoto's thyroiditis   . Hypothyroid     Past Surgical History:  Procedure Laterality Date  . BUNIONECTOMY Right   . PORTA CATH INSERTION N/A 03/31/2018   Procedure: PORTA CATH INSERTION;  Surgeon: Algernon Huxley, MD;  Location: [place] [facility];  Service: Cardiovascular;  Laterality: N/A;  . TONSILLECTOMY      There were no vitals filed for this visit.   Pelvic Floor Physical Therapy Treatment Note  SCREENING  Changes in medications, allergies, or medical history?: Having blood clots, being evaluated in CA center to determine cause.   SUBJECTIVE  Patient reports: Has not been able to progress with dilator use,  is performing every-other night for up to 30 min. Has been having issues, passing clots. Has finished active treatments. Still having pain where the tumor was and also having pain with deep inhale. Increased pain was around clotting.  Precautions:  Cervical CA  Pain update:  Location of pain: LLB/L SIJ Current pain:  4/10  Max pain:  9/10 Least pain:  0/10 Nature of pain: pinchy  *3/10 following treatment, less in shoulders and able to take deeper breath.  Patient Goals: Have sex without pain and not have pain in the hip all the time.   OBJECTIVE  Changes in: Posture/Observations:  Pt. Appears tired and very concerned, melancholy today compared to prior visits.   Strength/recruitment: Difficulty engaging TA in hook-lying, able to feel recruitment but has very small ROM with pelvic tilt before pain increases.   INTERVENTIONS THIS SESSION: Therex: Educated on and practiced snow-angels and shoulder flexion on foam roller and posterior pelvic tilts in hook-lying with emphasis on finding and using pain-free range of motion. Self-care: Educated on pain science and incorporating the principles into her daily life by using and acknowledging pain-free ROM and acknowledging where the borders of the pain are and how to gently push them further (wave visualization).    Total time: 55 min.  PT Short Term Goals - 10/04/18 0904      PT SHORT TERM GOAL #1   Title  Patient will demonstrate improved pelvic alignment and balance of musculature surrounding the pelvis to facilitate decreased PFM spasms and decrease pelvic pain.    Baseline  R anterior rotation and spasms surrounding pelvis. As of 6/29: decreased hip-flexor spasms and improved pelvic alignment by ~ 50%    Time  3    Period  Weeks    Status  Partially Met    Target Date  10/03/18      PT SHORT TERM GOAL #2   Title  Pt. will describe successful implementation of dialator  use by progressing by 1 dilator size to begin improving tissue extensibility for decreased pain with intercourse.    Baseline  Pt. is unable to  fully insert ~ size #4 dilator given to her by MD. Pt. using dilators regularly but unable to progress yet due to pain, tissue fragility, and bleeding    Time  3    Period  Weeks    Status  On-going    Target Date  10/03/18      PT SHORT TERM GOAL #3   Title  Pt. will successfully implement vaginal moisturizer and massage program to improve vaginal tissue health and decrease pain with dilator use and intercourse.    Baseline  Pt. lacks knowleddge of how/when/what to use to improve vaginal tissue health for decreased pain.    Time  3    Period  Weeks    Status  Achieved    Target Date  10/03/18      PT SHORT TERM GOAL #4   Title  Patient will demonstrate HEP x1 in the clinic to demonstrate understanding and proper form to allow for further improvement.    Baseline  Pt. lacks knowledge of therapeutic exercises that can help decrease her pain.    Time  3    Period  Weeks    Status  Achieved    Target Date  10/03/18        PT Long Term Goals - 09/14/18 1221      PT LONG TERM GOAL #1   Title  Patient will describe pain no greater than 1/10 during Intercourse and ADL's to demonstrate improved functional ability.    Baseline  Pain can increase to 9/10 with standing, sitting, sleeping, lifting    Time  10    Period  Weeks    Status  New    Target Date  11/21/18      PT LONG TERM GOAL #2   Title  Patient will demonstrate ability to perform self internal TP release in order to facilitate further PFM spasm reduction at home for faster resolution of symptoms.    Baseline  Pt. lacks knowledge of how to perform self internal TP release for decreased pain with intercourse.    Time  10    Period  Weeks    Status  New    Target Date  11/21/18      PT LONG TERM GOAL #3   Title  Patient will report no pain with intercourse to demonstrate improved  functional ability.    Baseline  Pt is unable to tolerate intercourse due to pain and fear of vaginal penetration following cervical CA treatments.    Time  10    Period  Weeks    Status  New    Target Date  11/21/18  PT LONG TERM GOAL #4   Title  Patient will score at or below 24/75 on the [facility] to demonstrate a clinically significant decrease in disability and improved functional ability.    Baseline  [place]: 48/75    Time  10    Period  Weeks    Status  New    Target Date  11/21/18            Plan - 11/14/18 1505    Clinical Impression Statement  Pt. Responded well to all interventions today, demonstrating improved pain in upper back and increased ability to inhale deeply without pain as well as understanding and correct performance of all education and exercises provided today. They will continue to benefit from skilled physical therapy to work toward remaining goals and maximize function as well as decrease likelihood of symptom increase or recurrence.\    PT Next Visit Plan  Work within a persistent pain framework, add gentle yoga strengthening, B mid-foot decreased mobility, give glute, TA, and hip ABD strengthening, following session ?internal    PT Home Exercise Plan  lubricants/vaginal moisturizers, lymphedema massage, hip-flexor stretch, dilator use, Child's pose, pidgeon pose, frog stretch, side-stretch, vit E/coconut oil, chair pose, knee plank, malasana (deep squat)    Consulted and Agree with Plan of Care  Patient       Patient will benefit from skilled therapeutic intervention in order to improve the following deficits and impairments:     Visit Diagnosis: 1. Other muscle spasm   2. Abnormal posture   3. Disorder of the skin and subcutaneous tissue related to radiation, unspecified        Problem List Patient Active Problem List   Diagnosis Date Noted  . Premature menopause on hormone replacement therapy 10/20/2018  . Adjustment disorder with mixed anxiety  and depressed mood 10/20/2018  . Nausea without vomiting 04/25/2018  . Iron deficiency anemia 04/11/2018  . Goals of care, counseling/discussion 03/28/2018  . 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[place]) 03/23/2018  . Hypothyroidism due to Hashimoto's thyroiditis 03/18/2018  . H/O fetal demise, not currently pregnant 03/18/2018  . Anxiety 09/05/2015  . Clinical depression 09/05/2015  . Big thyroid 09/05/2015  . Cannot sleep 09/05/2015  . Adiposity 09/05/2015  . 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 Tobacco use disorder 09/05/2015   Willa Rough DPT, ATC Willa Rough 11/14/2018, 3:17 PM  [place] MAIN [facility] SERVICES [address] [place], [place], [address] Phone: [phone]   Fax:  [phone]  Name: MAGALLY VAHLE MRN: [id number] Date of Birth: 1985/05/22

## 2018-11-14 NOTE — Progress Notes (Signed)
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  She has also been having irregular menstrual bleeding and spotting on and off for the last few months.She had an ultrasound pelvis done in the ER which showed a large 5.8 cm hypoechoic vascular mass within the cervix and the lower uterine segment.  Patient had last seen GYN in 2013 and had not had a Pap smear done since then.  Patient was seen by GYN Dr. Nechama Guard on 1213 and was found to have firm friable cervix and multiple biopsies were taken which showed adenocarcinoma.  She was then seen by Dr. Fransisca Connors from GYN oncology. Pelvic exam showed anteverted cervix that was replaced by tumor and extension of cancer into the left parametrium and possibly to the pelvic sidewall.    PET CT scan on 03/28/2018 showed 6 cm hypermetabolic cervical mass consistent with primary cervical carcinoma.  Mild hypermetabolic bilateral parametrial, right perirectal,  bilateral iliac lymph nodes,  consistent with metastatic disease.  No evidence of metastatic disease within the abdomen, chest, or neck.   Cycle 1 of  weekly cisplatin and radiation started on 04/11/2018. She completed chemo-radiation on 05/19/2018 followed by vaginal brachytherapy at [facility].  PET for restaging on 08/31/2018 showed clear interval response to therapy, cervix is decreased substantially in size with no substantial hypermetabolism today, interval resolution of the hypermetabolic pelvic lymphadenopathy seen previously.   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[place])  03/23/2018 Initial Diagnosis   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[place])   04/01/2018 Cancer Staging   Staging form: Cervix Uteri, AJCC 8th Edition - Clinical stage from 04/01/2018: FIGO Stage III (cT3, cN1, cM0) - Signed by Sindy Guadeloupe, MD on 04/01/2018   04/11/2018 -  Chemotherapy   The patient had palonosetron (ALOXI) injection 0.25 mg, 0.25 mg, Intravenous,  Once, 6 of 6 cycles Administration: 0.25 mg (04/11/2018), 0.25 mg (04/19/2018), 0.25 mg (04/27/2018), 0.25 mg (05/04/2018), 0.25 mg (05/12/2018), 0.25 mg (05/19/2018) CISplatin (PLATINOL) 90 mg in sodium chloride 0.9 % 250 mL chemo infusion, 40 mg/m2 = 90 mg, Intravenous,  Once, 6 of 6 cycles Administration: 90 mg (04/11/2018), 90 mg (04/27/2018), 90 mg (04/20/2018), 90 mg (05/04/2018), 90 mg (05/12/2018), 90 mg (05/19/2018) fosaprepitant (EMEND) 150 mg, dexamethasone (DECADRON) 12 mg in sodium chloride 0.9 % 145 mL IVPB, , Intravenous,  Once, 6 of 6 cycles Administration:  (04/11/2018),  (04/27/2018),  (04/20/2018),  (05/04/2018),  (05/12/2018),  (05/19/2018)  for chemotherapy treatment.      Interval history- Kristy Gordon, 33 year old female with above history of stage IIIc adenocarcinoma of the cervix with positive lymph node status post cisplatin and radiation completed 05/19/2018 followed by vaginal brachytherapy with excellent response based on PET, who presents to symptom management clinic for follow-up for her  hormone replacement therapy/HRT.  She suffers premature menopause due to treatments for cervical cancer and has previously had poorly controlled  vasomotor symptoms with Activella.  At last visit, she was started on transdermal estradiol 0.075mg /24 hours with progestin.   Today, she says that her hot flashes have significantly improved.  Symptoms occur but only every few days and no longer drenching.  Overall she says she feels better.  She does complain of left lower quadrant pain.  She has had pain in this area previously but says about a week ago pain became increasing in intensity and frequency.  Pain radiates to her back.  She also had an episode of vaginal bleeding described as passing 1 large clot about 8 days ago.  Has not occurred again.  She continues to have pain with intercourse.  Continues to use vaginal dilators that painful.  Continues to receive pelvic floor physical therapy.  Stress at home is similar to previous visits and she remains separated from her husband with plans for divorce.  Emotionally though she says she is coping fine.  She unfortunately continues to smoke though she has cut back.  She is not participated in quit Smart or counseling services which were previously recommended.   ECOG FS:1 - Symptomatic but completely ambulatory  Review of systems- Review of Systems  Reason unable to perform ROS: GYN- bleeding per hpi.  Constitutional: Negative for chills, fever, malaise/fatigue and weight loss.       Weight gain  HENT: Negative for hearing loss, nosebleeds, sore throat and tinnitus.   Eyes: Negative for blurred vision and double vision.  Respiratory: Negative for cough, hemoptysis, shortness of breath and wheezing.   Cardiovascular: Negative for chest pain, palpitations and leg swelling.  Gastrointestinal: Positive for abdominal pain. Negative for blood in stool, constipation, diarrhea, melena, nausea and vomiting.  Genitourinary: Negative for dysuria and urgency.  Musculoskeletal: Positive for back pain. Negative for falls, joint pain and myalgias.  Skin: Negative for itching and rash.  Neurological: Negative  for dizziness, tingling, sensory change, loss of consciousness, weakness and headaches.  Endo/Heme/Allergies: Negative for environmental allergies. Does not bruise/bleed easily.  Psychiatric/Behavioral: Positive for depression. The patient is nervous/anxious and has insomnia.     Current treatment-surveillance  Allergies  Allergen Reactions  . Amoxicillin Rash  . Penicillins Hives    Past Medical History:  Diagnosis Date  . Anxiety   . Cancer (HCC)    cervical cancer  . Hashimoto's thyroiditis   . Hypothyroid     Past Surgical History:  Procedure Laterality Date  . BUNIONECTOMY Right   . PORTA CATH INSERTION N/A 03/31/2018   Procedure: PORTA CATH INSERTION;  Surgeon: Algernon Huxley, MD;  Location: [place] [facility];  Service: Cardiovascular;  Laterality: N/A;  . TONSILLECTOMY      Social History   Socioeconomic History  . Marital status: Married    Spouse name: Not on file  . Number of children: Not on file  . Years of education: Not on file  . Highest education level: Not on file  Occupational History  . Not on file  Social Needs  . Financial resource strain: Not on file  . Food insecurity    Worry: Not on file    Inability: Not on file  . Transportation needs    Medical: Not on file    Non-medical: Not on file  Tobacco Use  . Smoking status: Current Every Day Smoker    Packs/day: 0.15    Years:  10.00    Pack years: 1.50    Types: Cigarettes  . Smokeless tobacco: Never Used  . Tobacco comment: 3 cig a day and trying to stop  Substance and Sexual Activity  . Alcohol use: Yes    Comment: rare  . Drug use: Yes    Types: Marijuana    Comment: quit opiates 5 years ago  . Sexual activity: Yes    Birth control/protection: None  Lifestyle  . Physical activity    Days per week: Not on file    Minutes per session: Not on file  . Stress: Not on file  Relationships  . Social Herbalist on phone: Not on file    Gets together: Not on file     Attends religious service: Not on file    Active member of club or organization: Not on file    Attends meetings of clubs or organizations: Not on file    Relationship status: Not on file  . Intimate partner violence    Fear of current or ex partner: Not on file    Emotionally abused: Not on file    Physically abused: Not on file    Forced sexual activity: Not on file  Other Topics Concern  . Not on file  Social History Narrative  . Not on file    Family History  Adopted: Yes  Family history unknown: Yes     Current Outpatient Medications:  .  acetaminophen (TYLENOL) 500 MG tablet, Take 500 mg by mouth every 4 (four) hours as needed., Disp: , Rfl:  .  ALPRAZolam (XANAX) 1 MG tablet, TAKE 1 TABLET BY MOUTH THREE TIMES DAILY AS NEEDED, Disp: 90 tablet, Rfl: 4 .  estradiol (VIVELLE-DOT) 0.075 MG/24HR, Apply 0.075 mg patch to skin of abdomen or buttock twice weekly., Disp: 8 patch, Rfl: 0 .  levothyroxine (SYNTHROID) 175 MCG tablet, Take 1 tablet (175 mcg total) by mouth daily before breakfast., Disp: 90 tablet, Rfl: 1 .  progesterone (PROMETRIUM) 200 MG capsule, Take 200 mg by mouth at bedtime with food for 12 days sequentially per 28 day cycle., Disp: 12 capsule, Rfl: 0 .  lidocaine-prilocaine (EMLA) cream, Apply to affected area once (Patient not taking: Reported on 10/18/2018), Disp: 30 g, Rfl: 3 .  pantoprazole (PROTONIX) 20 MG tablet, Take 1 tablet (20 mg total) by mouth daily. (Patient not taking: Reported on 10/18/2018), Disp: 30 tablet, Rfl: 3 No current facility-administered medications for this visit.   Facility-Administered Medications Ordered in Other Visits:  .  sodium chloride flush (NS) 0.9 % injection 10 mL, 10 mL, Intravenous, PRN, Sindy Guadeloupe, MD, 10 mL at 04/15/18 0825  Physical exam:  Vitals:   11/14/18 1109  BP: (!) 125/93  Pulse: (!) 103  Resp: 18  Temp: 99 F (37.2 C)  TempSrc: Tympanic   Physical Exam Constitutional:      General: She is not in  acute distress.    Comments: Obese, unaccompanied; seen in exam room  HENT:     Head: Normocephalic and atraumatic.     Mouth/Throat:     Mouth: Mucous membranes are moist.     Pharynx: Oropharynx is clear.  Eyes:     General: No scleral icterus.    Conjunctiva/sclera: Conjunctivae normal.  Neck:     Musculoskeletal: Neck supple.  Cardiovascular:     Rate and Rhythm: Normal rate and regular rhythm.  Pulmonary:     Effort: Pulmonary effort is normal.  Breath sounds: Normal breath sounds.  Abdominal:     General: There is no distension.     Palpations: Abdomen is soft. There is no mass.     Tenderness: There is abdominal tenderness (mild LLQ ttp). There is no rebound.  Genitourinary:    Comments: deferred Musculoskeletal: Normal range of motion.  Lymphadenopathy:     Cervical: No cervical adenopathy.  Skin:    General: Skin is warm and dry.  Neurological:     Mental Status: She is alert and oriented to person, place, and time.     Motor: No weakness.     Gait: Gait normal.  Psychiatric:        Mood and Affect: Mood is anxious.        Behavior: Behavior normal.      CMP Latest Ref Rng & Units 09/06/2018  Glucose 70 - 99 mg/dL 119(H)  BUN 6 - 20 mg/dL 11  Creatinine 0.44 - 1.00 mg/dL 0.82  Sodium 135 - 145 mmol/L 137  Potassium 3.5 - 5.1 mmol/L 3.6  Chloride 98 - 111 mmol/L 105  CO2 22 - 32 mmol/L 23  Calcium 8.9 - 10.3 mg/dL 8.8(L)  Total Protein 6.5 - 8.1 g/dL 7.7  Total Bilirubin 0.3 - 1.2 mg/dL 0.3  Alkaline Phos 38 - 126 U/L 73  AST 15 - 41 U/L 23  ALT 0 - 44 U/L 19   CBC Latest Ref Rng & Units 09/06/2018  WBC 4.0 - 10.5 K/uL 5.4  Hemoglobin 12.0 - 15.0 g/dL 13.2  Hematocrit 36.0 - 46.0 % 38.7  Platelets 150 - 400 K/uL 348   No images are attached to the encounter.  No results found.  Assessment and plan- Patient is a 33 y.o. female with history of cervical cancer who presents to symptom management clinic for premature menopause due to treatments for  cervical cancer, abdominal pain and vaginal bleeding.  1.  Stage IIIc cervical adenocarcinoma-status post primary chemoradiation with excellent response based on 08/31/2018 PET which I again reviewed today.  She has been on surveillance with negative pelvic exams since that time.  Vaginal bleeding and pain today concerning.  I discussed symptoms with  Dr. Theora Gianotti and Dr. Janese Banks who both recommend CT chest/abdomen/pelvis to evaluate further.  Her pelvic exam previously was somewhat challenging and limited previously d/t agglutination and pain and therefore feel scan upfront would be best and I will plan to have her see gyn-onc following.   2.  Premature menopause due to chemotherapy and radiation for treatment of cervical cancer- menopausal symptoms/premature menopause poorly controlled on Activella. IUD was not an option due to vaginal agglutination.  Given smoking, did not recommend birth control pills.  Currently on transdermal estradiol 0.075 mg/24 hours. She has her uterus and receives opposing progestin for prevention of endometrial hyperplasia.  Symptoms better controlled on current dose.  Continue transdermal estrogen and progestin.  Again educated regarding VTE risk and encourage smoking cessation.  Again discussed rationale for HRT. - Continue Vivelle Dot 0.075 mg/q24 h every 3 days  - Continue prometrium 200mg  capsule nightly 12 days of months. Take with bite of food for better absorption   3. Tobacco Use Disorder-again encourage smoking cessation including increased risk of VTE in setting of HRT and offered referral to quit Smart program which she declined.   4.  Adjustment disorder -chronic anxiety and depression that have been somewhat worse secondary to cancer diagnosis, treatment, and separation.  Continue medication management with PCP.  Again recommended counseling  services.  5. Port-a-cath- port flush today. Plan to repeat every 8-12 weeks for maintenance.   CT Chest/Abdomen/Pelvis with  control on 11/22/2018. Follow up with gyn-onc following imaging for pelvic exam. Notify clinic if symptoms persist or worsen. Follow up with PCP for other complaints.   Visit Diagnosis 1. 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[place])   2. Left lower quadrant abdominal pain   3. Premature menopause on hormone replacement therapy    Patient expressed understanding and was in agreement with this plan. She also understands that She can call clinic at any time with any questions, concerns, or complaints.   Thank you for allowing me to participate in the care of this pleasant patient.   Beckey Rutter, DNP, AGNP-C [place] [facility] [place] (work cell) [phone] (office)  CC: Dr. Janese Banks, Dr. Theora Gianotti

## 2018-11-15 ENCOUNTER — Encounter: Payer: Medicaid Other | Admitting: Nurse Practitioner

## 2018-11-16 MED ORDER — HEPARIN SOD (PORK) LOCK FLUSH 100 UNIT/ML IV SOLN
500.0000 [IU] | Freq: Once | INTRAVENOUS | Status: AC
  Administered 2018-11-14: 12:00:00 500 [IU] via INTRAVENOUS

## 2018-11-16 MED ORDER — SODIUM CHLORIDE 0.9% FLUSH
10.0000 mL | INTRAVENOUS | Status: DC | PRN
  Administered 2018-11-14: 10 mL via INTRAVENOUS
  Filled 2018-11-16: qty 10

## 2018-11-18 ENCOUNTER — Telehealth: Payer: Self-pay | Admitting: *Deleted

## 2018-11-18 ENCOUNTER — Inpatient Hospital Stay: Payer: Medicaid Other

## 2018-11-18 ENCOUNTER — Other Ambulatory Visit: Payer: Self-pay | Admitting: *Deleted

## 2018-11-18 ENCOUNTER — Other Ambulatory Visit: Payer: Self-pay

## 2018-11-18 DIAGNOSIS — C539 Malignant neoplasm of cervix uteri, unspecified: Secondary | ICD-10-CM | POA: Diagnosis not present

## 2018-11-18 DIAGNOSIS — R102 Pelvic and perineal pain: Secondary | ICD-10-CM

## 2018-11-18 MED ORDER — KETOROLAC TROMETHAMINE 15 MG/ML IJ SOLN
15.0000 mg | Freq: Once | INTRAMUSCULAR | Status: AC
  Administered 2018-11-18: 16:00:00 15 mg via INTRAMUSCULAR
  Filled 2018-11-18: qty 1

## 2018-11-18 NOTE — Telephone Encounter (Signed)
Dr. Janese Banks said a 1 time inj. Called pt and she agreed to come in and I put injection encounter in as well as put order in per Dr. Janese Banks giving me the drug and dose

## 2018-11-18 NOTE — Telephone Encounter (Signed)
Patient called requesting to come in and get a Toradol injection today because she is in terrible pain. Please advise

## 2018-11-20 ENCOUNTER — Other Ambulatory Visit: Payer: Self-pay | Admitting: Nurse Practitioner

## 2018-11-20 DIAGNOSIS — E28319 Asymptomatic premature menopause: Secondary | ICD-10-CM

## 2018-11-21 ENCOUNTER — Ambulatory Visit: Payer: Medicaid Other

## 2018-11-21 ENCOUNTER — Encounter: Payer: Self-pay | Admitting: Nurse Practitioner

## 2018-11-22 ENCOUNTER — Other Ambulatory Visit: Payer: Self-pay

## 2018-11-22 ENCOUNTER — Ambulatory Visit
Admission: RE | Admit: 2018-11-22 | Discharge: 2018-11-22 | Disposition: A | Discharge status: Home / Self Care | Payer: Medicaid Other | Source: Ambulatory Visit | Attending: Nurse Practitioner | Admitting: Nurse Practitioner

## 2018-11-22 DIAGNOSIS — R1032 Left lower quadrant pain: Secondary | ICD-10-CM | POA: Diagnosis not present

## 2018-11-22 DIAGNOSIS — C539 Malignant neoplasm of cervix uteri, unspecified: Secondary | ICD-10-CM

## 2018-11-22 DIAGNOSIS — K6389 Other specified diseases of intestine: Secondary | ICD-10-CM | POA: Diagnosis not present

## 2018-11-22 MED ORDER — IOHEXOL 300 MG/ML  SOLN
100.0000 mL | Freq: Once | INTRAMUSCULAR | Status: AC | PRN
  Administered 2018-11-22: 14:00:00 100 mL via INTRAVENOUS

## 2018-11-22 NOTE — Telephone Encounter (Signed)
I called patient's pharmacy. They were trying to fill original estradiol prescription with manufacturer that was not covered by Medicaid. They have re-run medication and are able to fill original prescription without prior auth. I will deny this duplicate prescription.

## 2018-11-28 ENCOUNTER — Ambulatory Visit: Payer: Medicaid Other

## 2018-12-01 ENCOUNTER — Telehealth: Payer: Self-pay

## 2018-12-01 NOTE — Telephone Encounter (Signed)
   Called Pt. To follow-up on missed Appt. On 11/28/2018 and determine her intentions for continued therapy. Asked her to follow up to let us know if she plans on continuing therapy so we can submit for more visits through medicaid. Asked her to respond either way at [phone].  -Letitia Libra DPT, ATC

## 2018-12-02 ENCOUNTER — Other Ambulatory Visit: Payer: Self-pay

## 2018-12-02 ENCOUNTER — Ambulatory Visit
Admission: RE | Admit: 2018-12-02 | Discharge: 2018-12-02 | Disposition: A | Discharge status: Home / Self Care | Payer: Medicaid Other | Source: Ambulatory Visit | Attending: Radiation Oncology | Admitting: Radiation Oncology

## 2018-12-02 ENCOUNTER — Inpatient Hospital Stay (HOSPITAL_BASED_OUTPATIENT_CLINIC_OR_DEPARTMENT_OTHER): Payer: Medicaid Other | Admitting: Oncology

## 2018-12-02 ENCOUNTER — Encounter: Payer: Self-pay | Admitting: Oncology

## 2018-12-02 ENCOUNTER — Other Ambulatory Visit: Payer: Medicaid Other

## 2018-12-02 VITALS — BP 117/85 | HR 108 | Temp 98.2°F | Resp 18 | Wt 294.9 lb

## 2018-12-02 DIAGNOSIS — R102 Pelvic and perineal pain: Secondary | ICD-10-CM

## 2018-12-02 DIAGNOSIS — Z923 Personal history of irradiation: Secondary | ICD-10-CM | POA: Diagnosis not present

## 2018-12-02 DIAGNOSIS — R1030 Lower abdominal pain, unspecified: Secondary | ICD-10-CM | POA: Diagnosis not present

## 2018-12-02 DIAGNOSIS — Z9221 Personal history of antineoplastic chemotherapy: Secondary | ICD-10-CM | POA: Diagnosis not present

## 2018-12-02 DIAGNOSIS — C539 Malignant neoplasm of cervix uteri, unspecified: Secondary | ICD-10-CM | POA: Insufficient documentation

## 2018-12-02 DIAGNOSIS — R232 Flushing: Secondary | ICD-10-CM | POA: Diagnosis not present

## 2018-12-02 MED ORDER — GABAPENTIN 300 MG PO CAPS: 300.0000 mg | ORAL_CAPSULE | Freq: Three times a day (TID) | ORAL | 0 refills | Status: DC

## 2018-12-02 NOTE — Progress Notes (Signed)
Radiation Oncology Follow up Note  Name: Kristy Gordon   Date:   12/02/2018 MRN:  [id number] DOB: 1985-09-01    This 33 y.o. female presents to the clinic today for 30-month follow-up status post chemotherapy as well as external beam radiation therapy and brachii therapy for stage IIIc adenocarcinoma of the cervix (T2 N1 M0).Loistine Chance PROVIDER: [name],*  HPI: Patient is a 33 year old female now seen at 6 months having completed both chemotherapy as well as both external beam radiation therapy and brachytherapy performed at [facility] for stage IIIc adenocarcinoma of the cervix.  Seen today in routine follow-up she is doing fairly well she does have some abdominal pain.  He is recently been recommended to use dilator which she has not been doing up to this point..  She had a recent CT scan of chest abdomen pelvis which I have reviewed.  This shows no evidence of metastatic disease in chest abdomen or pelvis.  There was unchanged posttreatment appearance of the cervix and pelvis with no change in tiny pelvic sidewall and iliac lymph nodes.  She specifically denies any increased lower urinary tract symptoms or diarrhea.  She describes the pain in her lower abdomen is a pinching type sensation.  COMPLICATIONS OF TREATMENT: none  FOLLOW UP COMPLIANCE: keeps appointments   PHYSICAL EXAM:  There were no vitals taken for this visit. Well-developed well-nourished patient in NAD. HEENT reveals PERLA, EOMI, discs not visualized.  Oral cavity is clear. No oral mucosal lesions are identified. Neck is clear without evidence of cervical or supraclavicular adenopathy. Lungs are clear to A&P. Cardiac examination is essentially unremarkable with regular rate and rhythm without murmur rub or thrill. Abdomen is benign with no organomegaly or masses noted. Motor sensory and DTR levels are equal and symmetric in the upper and lower extremities. Cranial nerves II through XII are grossly intact. Proprioception  is intact. No peripheral adenopathy or edema is identified. No motor or sensory levels are noted. Crude visual fields are within normal range.  RADIOLOGY RESULTS: CT scans chest abdomen pelvis reviewed compatible with above-stated findings  PLAN: Present time patient is doing well is under excellent control by CT criteria.  I am pleased with her overall progress I explained to her about the radiation surgery causing scarring which may be responsible for some of her symptoms.  She has been seeing symptom management.  She will start using a dilator on a regular basis.  I have asked to see her back in 6 months for follow-up.  She continues close follow-up care and GYN oncology clinic.  Patient knows to call with any concerns.  I would like to take this opportunity to thank you for allowing me to participate in the care of your patient.Noreene Filbert, MD

## 2018-12-02 NOTE — Addendum Note (Signed)
Addended by: Luella Cook on: 12/02/2018 02:20 PM   Modules accepted: Orders

## 2018-12-02 NOTE — Progress Notes (Signed)
Survivorship Care Plan and Treatment summary given to patient and ASCO answers booklet.   Patient in agreement for me to call her next Tuesday 12/06/2018 to review SCP and discuss post cancer care.

## 2018-12-02 NOTE — Progress Notes (Signed)
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 Positive pelvic LN  Chief complaint/ Reason for visit-discuss CT scan results  Heme/Onc history: patient is a 33 year old female G72 who initially presented to the ER on 03/15/2018 with symptoms of abdominal pain and cramping. She has also been having irregular menstrual bleeding and spotting on and off for the last few months.She had an ultrasound pelvis done in the ER which showed a large 5.8 cm hypoechoic vascular mass within the cervix and the lower uterine segment. Patient had last seen GYN in 2013 and had not had a Pap smear done since then. Patient was seen by GYN Dr. Nechama Guard on 1213 and was found to have firm friable cervix and multiple biopsies were taken which showed adenocarcinoma. She was then seen by Dr. Fransisca Connors from GYN oncology. Pelvic exam showed anteverted cervix that was replaced by tumor and extension of cancer into the left parametrium and possibly to the pelvic sidewall.  PET CT scan on 03/28/2018 showed 6 cm hypermetabolic cervical mass consistent with primary cervical carcinoma. Mild hypermetabolic bilateral parametrial right perirectal bilateral iliac lymph nodes consistent with metastatic disease. No evidence of metastatic disease within the abdomen chest or neck.  Cycle 1 of weekly cisplatin and radiation started on 04/11/2018. She completed  chemoradiation on 05/19/2018 followed by vaginal brachii therapy.  PET CT scan thereafter did not show any evidence of metastatic disease and interval resolution of hypermetabolic pelvic adenopathy and no hypermetabolism seen in the area of the cervix.  Interval history-patient continues to have pelvic pain which is dull aching since her chemoradiation.  She has not been sexually active since her diagnosis.  She has been using vaginal dilator on and off to help with her pain.  She still has occasional vaginal bleeding.  She also has problems with anxiety and has been on alprazolam.  She reports intolerable hot flashes despite going on estrogen and progesterone.  States that her hot flashes are sometimes so bad that she has to throw up  ECOG PS- 0 Pain scale- 0 Opioid associated constipation- no  Review of systems- Review of Systems  Constitutional: Negative for chills, fever, malaise/fatigue and weight loss.  HENT: Negative for congestion, ear discharge and nosebleeds.   Eyes: Negative for blurred vision.  Respiratory: Negative for cough, hemoptysis, sputum production, shortness of breath and wheezing.   Cardiovascular: Negative for chest pain, palpitations, orthopnea and claudication.  Gastrointestinal: Negative for abdominal pain, blood in stool, constipation, diarrhea, heartburn, melena, nausea and vomiting.  Genitourinary: Negative for dysuria, flank pain, frequency, hematuria and urgency.       Pelvic/vaginal pain  Musculoskeletal: Negative for back pain, joint pain and myalgias.  Skin: Negative for rash.  Neurological: Negative for dizziness, tingling, focal weakness, seizures, weakness and headaches.  Endo/Heme/Allergies: Does not bruise/bleed easily.       Hot flashes  Psychiatric/Behavioral: Negative for depression and suicidal ideas. The patient is nervous/anxious. The patient does not have insomnia.  Allergies  Allergen Reactions   Amoxicillin Rash   Penicillins Hives      Past Medical History:  Diagnosis Date   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[place]) 02/2018   Chemo, Rad + Brachytherapy tx's.    Anxiety    Hashimoto's thyroiditis    Hypothyroid      Past Surgical History:  Procedure Laterality Date   BUNIONECTOMY Right    PORTA CATH INSERTION N/A 03/31/2018   Procedure: PORTA CATH INSERTION;  Surgeon: Algernon Huxley, MD;  Location: [facility];  Service: Cardiovascular;  Laterality: N/A;   TONSILLECTOMY      Social History   Socioeconomic History   Marital status: Married    Spouse name: Not on file   Number of children: Not on file   Years of education: Not on file   Highest education level: Not on file  Occupational History   Not on file  Social Needs   Financial resource strain: Not on file   Food insecurity    Worry: Not on file    Inability: Not on file   Transportation needs    Medical: Not on file    Non-medical: Not on file  Tobacco Use   Smoking status: Current Every Day Smoker    Packs/day: 0.15    Years: 10.00    Pack years: 1.50    Types: Cigarettes   Smokeless tobacco: Never Used   Tobacco comment: 3 cig a day and trying to stop  Substance and Sexual Activity   Alcohol use: Yes    Comment: rare   Drug use: Yes    Types: Marijuana    Comment: quit opiates 5 years ago   Sexual activity: Yes    Birth control/protection: None  Lifestyle   Physical activity    Days per week: Not on file    Minutes per session: Not on file   Stress: Not on file  Relationships   Social connections    Talks on phone: Not on file    Gets together: Not on file    Attends religious service: Not on file    Active member of club or organization: Not on file    Attends meetings of clubs or organizations: Not on file    Relationship status: Not on file   Intimate partner violence    Fear of current or ex partner: Not on file    Emotionally abused: Not on file    Physically abused: Not on file    Forced  sexual activity: Not on file  Other Topics Concern   Not on file  Social History Narrative   Not on file    Family History  Adopted: Yes  Family history unknown: Yes     Current Outpatient Medications:    acetaminophen (TYLENOL) 500 MG tablet, Take 500 mg by mouth every 4 (four) hours as needed., Disp: , Rfl:    ALPRAZolam (XANAX) 1 MG tablet, TAKE 1 TABLET BY MOUTH THREE TIMES DAILY AS NEEDED, Disp: 90 tablet, Rfl: 4   estradiol (VIVELLE-DOT) 0.075 MG/24HR, APPLY 1 PATCH TOPICALLY TWICE A WEEK (APPLY TO SKIN OF ABDOMEN OR BUTTOCK), Disp: 8 patch, Rfl: 0   levothyroxine (SYNTHROID) 175 MCG tablet, Take 1 tablet (175 mcg total) by mouth daily before breakfast., Disp: 90 tablet, Rfl: 1   lidocaine-prilocaine (EMLA) cream, Apply to affected area once, Disp: 30 g, Rfl: 3   pantoprazole (PROTONIX) 20 MG tablet, Take 1 tablet (20 mg total) by mouth daily., Disp: 30  tablet, Rfl: 3   progesterone (PROMETRIUM) 200 MG capsule, Take 200 mg by mouth at bedtime with food for 12 days sequentially per 28 day cycle., Disp: 12 capsule, Rfl: 11 No current facility-administered medications for this visit.   Facility-Administered Medications Ordered in Other Visits:    sodium chloride flush (NS) 0.9 % injection 10 mL, 10 mL, Intravenous, PRN, Sindy Guadeloupe, MD, 10 mL at 04/15/18 0825  Physical exam:  Vitals:   12/02/18 0958  BP: 117/85  Pulse: (!) 108  Resp: 18  Temp: 98.2 F (36.8 C)  TempSrc: Tympanic  Weight: 294 lb 14.4 oz (133.8 kg)   Physical Exam Constitutional:      General: She is not in acute distress.    Appearance: She is obese.  HENT:     Head: Normocephalic and atraumatic.  Eyes:     Pupils: Pupils are equal, round, and reactive to light.  Neck:     Musculoskeletal: Normal range of motion.  Cardiovascular:     Rate and Rhythm: Normal rate and regular rhythm.     Heart sounds: Normal heart sounds.  Pulmonary:     Effort: Pulmonary effort is normal.     Breath  sounds: Normal breath sounds.  Abdominal:     General: Bowel sounds are normal.     Palpations: Abdomen is soft.  Skin:    General: Skin is warm and dry.  Neurological:     Mental Status: She is alert and oriented to person, place, and time.      CMP Latest Ref Rng & Units 11/14/2018  Glucose 70 - 99 mg/dL 103(H)  BUN 6 - 20 mg/dL 10  Creatinine 0.44 - 1.00 mg/dL 0.74  Sodium 135 - 145 mmol/L 136  Potassium 3.5 - 5.1 mmol/L 3.9  Chloride 98 - 111 mmol/L 103  CO2 22 - 32 mmol/L 25  Calcium 8.9 - 10.3 mg/dL 9.2  Total Protein 6.5 - 8.1 g/dL 7.9  Total Bilirubin 0.3 - 1.2 mg/dL 0.3  Alkaline Phos 38 - 126 U/L 79  AST 15 - 41 U/L 30  ALT 0 - 44 U/L 36   CBC Latest Ref Rng & Units 11/14/2018  WBC 4.0 - 10.5 K/uL 5.3  Hemoglobin 12.0 - 15.0 g/dL 12.8  Hematocrit 36.0 - 46.0 % 36.5  Platelets 150 - 400 K/uL 367    No images are attached to the encounter.  Ct Chest W Contrast  Result Date: 11/22/2018 CLINICAL DATA:  Cervical cancer restaging, left lower quadrant pain, new vaginal bleeding EXAM: CT CHEST, ABDOMEN, AND PELVIS WITH CONTRAST TECHNIQUE: Multidetector CT imaging of the chest, abdomen and pelvis was performed following the standard protocol during bolus administration of intravenous contrast. CONTRAST:  177mL OMNIPAQUE IOHEXOL 300 MG/ML SOLN, additional oral enteric contrast COMPARISON:  PET-CT, 08/31/2018, 03/28/2018 FINDINGS: CT CHEST FINDINGS Cardiovascular: No significant vascular findings. Normal heart size. No pericardial effusion. Mediastinum/Nodes: No enlarged mediastinal, hilar, or axillary lymph nodes. Soft tissue in the anterior mediastinum, consistent with thymus. Thyroid gland, trachea, and esophagus demonstrate no significant findings. Lungs/Pleura: Lungs are clear. No pleural effusion or pneumothorax. Musculoskeletal: No chest wall mass or suspicious bone lesions identified. CT ABDOMEN PELVIS FINDINGS Hepatobiliary: No solid liver abnormality is seen. No  gallstones, gallbladder wall thickening, or biliary dilatation. Pancreas: Unremarkable. No pancreatic ductal dilatation or surrounding inflammatory changes. Spleen: Normal in size without significant abnormality. Adrenals/Urinary Tract: Adrenal glands are unremarkable. Kidneys are normal, without renal calculi, solid lesion, or hydronephrosis. Bladder is unremarkable. Stomach/Bowel:  Stomach is within normal limits. Appendix appears normal. No evidence of bowel wall thickening, distention, or inflammatory changes. Large burden of stool in the colon. Vascular/Lymphatic: No significant vascular findings are present. No enlarged abdominal or pelvic lymph nodes. No change in tiny pelvic sidewall and iliac lymph nodes (e.g. Series 2, image 108). Reproductive: No mass or other abnormality. Other: No abdominal wall hernia or abnormality. No abdominopelvic ascites. Musculoskeletal: No acute or significant osseous findings. IMPRESSION: 1. Unchanged post treatment appearance of the cervix and pelvis, with no change in tiny pelvic sidewall and iliac lymph nodes, previously enlarged and hypermetabolic. 2. No evidence of metastatic disease in the chest, abdomen, or pelvis. 3. No specific findings in the abdomen or pelvis to explain left lower quadrant pain. Large burden of stool in the colon. 4. Soft tissue in the anterior mediastinum, consistent with thymus and likely reflecting thymic rebound in the setting of recent chemotherapy. Electronically Signed   By: Eddie Candle M.D.   On: 11/22/2018 14:32   Ct Abdomen Pelvis W Contrast  Result Date: 11/22/2018 CLINICAL DATA:  Cervical cancer restaging, left lower quadrant pain, new vaginal bleeding EXAM: CT CHEST, ABDOMEN, AND PELVIS WITH CONTRAST TECHNIQUE: Multidetector CT imaging of the chest, abdomen and pelvis was performed following the standard protocol during bolus administration of intravenous contrast. CONTRAST:  152mL OMNIPAQUE IOHEXOL 300 MG/ML SOLN, additional oral  enteric contrast COMPARISON:  PET-CT, 08/31/2018, 03/28/2018 FINDINGS: CT CHEST FINDINGS Cardiovascular: No significant vascular findings. Normal heart size. No pericardial effusion. Mediastinum/Nodes: No enlarged mediastinal, hilar, or axillary lymph nodes. Soft tissue in the anterior mediastinum, consistent with thymus. Thyroid gland, trachea, and esophagus demonstrate no significant findings. Lungs/Pleura: Lungs are clear. No pleural effusion or pneumothorax. Musculoskeletal: No chest wall mass or suspicious bone lesions identified. CT ABDOMEN PELVIS FINDINGS Hepatobiliary: No solid liver abnormality is seen. No gallstones, gallbladder wall thickening, or biliary dilatation. Pancreas: Unremarkable. No pancreatic ductal dilatation or surrounding inflammatory changes. Spleen: Normal in size without significant abnormality. Adrenals/Urinary Tract: Adrenal glands are unremarkable. Kidneys are normal, without renal calculi, solid lesion, or hydronephrosis. Bladder is unremarkable. Stomach/Bowel: Stomach is within normal limits. Appendix appears normal. No evidence of bowel wall thickening, distention, or inflammatory changes. Large burden of stool in the colon. Vascular/Lymphatic: No significant vascular findings are present. No enlarged abdominal or pelvic lymph nodes. No change in tiny pelvic sidewall and iliac lymph nodes (e.g. Series 2, image 108). Reproductive: No mass or other abnormality. Other: No abdominal wall hernia or abnormality. No abdominopelvic ascites. Musculoskeletal: No acute or significant osseous findings. IMPRESSION: 1. Unchanged post treatment appearance of the cervix and pelvis, with no change in tiny pelvic sidewall and iliac lymph nodes, previously enlarged and hypermetabolic. 2. No evidence of metastatic disease in the chest, abdomen, or pelvis. 3. No specific findings in the abdomen or pelvis to explain left lower quadrant pain. Large burden of stool in the colon. 4. Soft tissue in the  anterior mediastinum, consistent with thymus and likely reflecting thymic rebound in the setting of recent chemotherapy. Electronically Signed   By: Eddie Candle M.D.   On: 11/22/2018 14:32     Assessment and plan- Patient is a 33 y.o. female withadenocarcinoma of the cervix FIGO stage III C1r T2N1M0with metastases to the external iliac(pelvic lymph nodes).She is status post concurrent chemoradiation and vaginal brachii therapy currently in remission and here to discuss the results of her CT scans  Patient was seen by NP Beckey Rutter 2 weeks ago for symptoms of intermittent  vaginal bleeding.  She therefore underwent CT chest abdomen and pelvis with contrast which does not show any evidence of recurrence or metastatic disease.  She has small pelvic sidewall and iliac lymph nodes which have overall remained stable and did not show any significant hypermetabolism on her prior PET CT scan.  From a cervical cancer standpoint she remains in remission.  She will be seen GYN oncology in November 2020 and I will therefore see her back in 6 months time with a CBC with differential and CMP.  Chronic pelvic pain: Patient has chronic pelvic pain from scarring post chemoradiation.  She is not currently sexually active but gets repeated flareups of these pain episodes.  States that she was almost to the point of going to the ER a week ago.  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  I would like to refer her to pain management to manage her chronic pelvic pain.  Hot flashes: She continues to have significant symptoms despite being on estrogen and progesterone.  I will therefore start her on gabapentin 300 mg at night and slowly increase it to 3 times daily.  It may also help her with pelvic pain.  Patient verbalized understanding and is willing to try gabapentin at this time.  She does have a follow-up appointment with  NP Beckey Rutter next month and the symptoms can be followed up at that time as well   Visit Diagnosis 1. Vaginal pain   2. 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[place])   3. Hot flashes      Dr. Randa Evens, MD, MPH [facility] [id number] 12/02/2018 10:30 AM

## 2018-12-06 ENCOUNTER — Inpatient Hospital Stay: Payer: Medicaid Other | Attending: Nurse Practitioner | Admitting: Oncology

## 2018-12-06 NOTE — Progress Notes (Deleted)
CLINIC:  Survivorship   REASON FOR VISIT:  Routine follow-up post-treatment for a recent history of cervical cancer   BRIEF ONCOLOGIC HISTORY:  Oncology History Overview Note  Patient presented as a 33 year old, G0, to ER on 03/15/2018 with symptoms of abdominal pain and cramping.  She has also been having irregular menstrual bleeding and spotting on and off for the last few months.She had an ultrasound pelvis done in the ER which showed a large 5.8 cm hypoechoic vascular mass within the cervix and the lower uterine segment.  Patient had last seen GYN in 2013 and had not had a Pap smear done since then.  Patient was seen by GYN Dr. Nechama Guard on 1213 and was found to have firm friable cervix and multiple biopsies were taken which showed adenocarcinoma.  She was then seen by Dr. Fransisca Connors from GYN oncology. Pelvic exam showed anteverted cervix that was replaced by tumor and extension of cancer into the left parametrium and possibly to the pelvic sidewall.    PET CT scan on 03/28/2018 showed 6 cm hypermetabolic cervical mass consistent with primary cervical carcinoma.  Mild hypermetabolic bilateral parametrial, right perirectal,  bilateral iliac lymph nodes,  consistent with metastatic disease.  No evidence of metastatic disease within the abdomen, chest, or neck.   Cycle 1 of weekly cisplatin and radiation started on 04/11/2018. She completed chemo-radiation on 05/19/2018 followed by vaginal brachytherapy at [facility].  PET for restaging on 08/31/2018 showed clear interval response to therapy, cervix is decreased substantially in size with no substantial hypermetabolism today, interval resolution of the hypermetabolic pelvic lymphadenopathy seen previously.   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[place])   04/01/2018 Cancer Staging   Staging form: Cervix Uteri, AJCC 8th Edition - Clinical stage from 04/01/2018: FIGO Stage III (cT3, cN1, cM0) - Signed by Sindy Guadeloupe, MD on 04/01/2018   04/11/2018 -  Chemotherapy   The patient had palonosetron (ALOXI) injection 0.25 mg, 0.25 mg, Intravenous,  Once, 6 of 6 cycles Administration: 0.25 mg (04/11/2018), 0.25 mg (04/19/2018), 0.25 mg (04/27/2018), 0.25 mg (05/04/2018), 0.25 mg (05/12/2018), 0.25 mg (05/19/2018) CISplatin (PLATINOL) 90 mg in sodium chloride 0.9 % 250 mL chemo infusion, 40 mg/m2 = 90 mg, Intravenous,  Once, 6 of 6 cycles Administration: 90 mg (04/11/2018), 90 mg (04/27/2018), 90 mg (04/20/2018), 90 mg (05/04/2018), 90 mg (05/12/2018), 90 mg (05/19/2018) fosaprepitant (EMEND) 150 mg, dexamethasone (DECADRON) 12 mg in sodium chloride 0.9 % 145 mL IVPB, , Intravenous,  Once, 6 of 6 cycles Administration:  (04/11/2018),  (04/27/2018),  (04/20/2018),  (05/04/2018),  (05/12/2018),  (05/19/2018)  for chemotherapy treatment.      INTERVAL HISTORY:  Ms. Paschall presents to the [facility] today for our initial meeting to review her survivorship care plan detailing her treatment course for endometrial cancer,  as well as monitoring long-term side effects of that treatment, education regarding health maintenance, screening, and overall wellness and health promotion.     Overall, Ms. Vannice reports feeling quite well since completing her 6 cycles Cisplatin , on 05/19/18.    REVIEW OF SYSTEMS:  Review of Systems  Constitutional: Positive for fatigue.  Endocrine: Positive for hot flashes.  Psychiatric/Behavioral: Positive for depression.   ONCOLOGY TREATMENT TEAM:  1. Surgeon:  Dr. Theora Gianotti 2. Medical Oncologist: Dr. Janese Banks 3. Radiation Oncologist: Dr. Baruch Gouty    PAST MEDICAL/SURGICAL HISTORY:  Past Medical History:  Diagnosis Date  . 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   . Anxiety   . Hashimoto's thyroiditis   . Hypothyroid    Past Surgical History:  Procedure Laterality Date  . BUNIONECTOMY Right   . PORTA CATH INSERTION N/A 03/31/2018   Procedure: PORTA CATH  INSERTION;  Surgeon: Algernon Huxley, MD;  Location: [facility];  Service: Cardiovascular;  Laterality: N/A;  . TONSILLECTOMY       ALLERGIES:  Allergies  Allergen Reactions  . Amoxicillin Rash  . Penicillins Hives     CURRENT MEDICATIONS:  Outpatient Encounter Medications as of 12/06/2018  Medication Sig  . acetaminophen (TYLENOL) 500 MG tablet Take 500 mg by mouth every 4 (four) hours as needed.  . ALPRAZolam (XANAX) 1 MG tablet TAKE 1 TABLET BY MOUTH THREE TIMES DAILY AS NEEDED  . estradiol (VIVELLE-DOT) 0.075 MG/24HR APPLY 1 PATCH TOPICALLY TWICE A WEEK (APPLY TO SKIN OF ABDOMEN OR BUTTOCK)  . gabapentin (NEURONTIN) 300 MG capsule Take 1 capsule (300 mg total) by mouth 3 (three) times daily. 1 capsule at night x 3 days, then 1 capsule twice a day x 3 day, then 1 capsule three times a day for rest of rx  . levothyroxine (SYNTHROID) 175 MCG tablet Take 1 tablet (175 mcg total) by mouth daily before breakfast.  . lidocaine-prilocaine (EMLA) cream Apply to affected area once  . pantoprazole (PROTONIX) 20 MG tablet Take 1 tablet (20 mg total) by mouth daily.  . progesterone (PROMETRIUM) 200 MG capsule Take 200 mg by mouth at bedtime with food for 12 days sequentially per 28 day cycle.   Facility-Administered Encounter Medications as of 12/06/2018  Medication  . sodium chloride flush (NS) 0.9 % injection 10 mL     ONCOLOGIC FAMILY HISTORY:  Family History  Adopted: Yes  Family history unknown: Yes     GENETIC COUNSELING/TESTING:   SOCIAL HISTORY:  Aminah A Harlos is separated and lives alone in  [place], [place].  She has no children.  Ms. Council is currently not working.  She is a current everyday smoker.  PHYSICAL EXAMINATION:  Vital Signs:  There were no vitals filed for this visit. There were no vitals filed for this visit.  Limited d/t telephone visit.   LABORATORY DATA:  Lab Results  Component Value Date   WBC 5.3 11/14/2018   HGB 12.8 11/14/2018    HCT 36.5 11/14/2018   MCV 92.4 11/14/2018   PLT 367 11/14/2018     Chemistry      Component Value Date/Time   NA 136 11/14/2018 1158   NA 138 09/11/2015 0954   NA 142 09/11/2011 1701   K 3.9 11/14/2018 1158   K 4.2 09/11/2011 1701   CL 103 11/14/2018 1158   CL 109 (H) 09/11/2011 1701   CO2 25 11/14/2018 1158   CO2 22 09/11/2011 1701   BUN 10 11/14/2018 1158   BUN 11 09/11/2015 0954   BUN 10 09/11/2011 1701   CREATININE 0.74 11/14/2018 1158   CREATININE 0.67 09/11/2011 1701   GLU 84 04/16/2010      Component Value Date/Time   CALCIUM 9.2 11/14/2018 1158   CALCIUM 9.3 09/11/2011 1701   ALKPHOS 79 11/14/2018 1158   ALKPHOS 101 09/11/2011 1701   AST 30 11/14/2018 1158   AST 28 09/11/2011 1701   ALT 36 11/14/2018 1158   ALT 20 09/11/2011 1701   BILITOT 0.3 11/14/2018 1158   BILITOT 0.5 09/11/2015 0954   BILITOT 0.3 09/11/2011 1701  DIAGNOSTIC IMAGING:  IMPRESSION: 1. Unchanged post treatment appearance of the cervix and pelvis, with no change in tiny pelvic sidewall and iliac lymph nodes, previously enlarged and hypermetabolic.  2. No evidence of metastatic disease in the chest, abdomen, or pelvis.  3. No specific findings in the abdomen or pelvis to explain left lower quadrant pain. Large burden of stool in the colon.  4. Soft tissue in the anterior mediastinum, consistent with thymus and likely reflecting thymic rebound in the setting of recent chemotherapy.     ASSESSMENT AND PLAN:  Ms.. Garlinger is a pleasant 33 y.o. female with Stage endometrial carcinoma, diagnosed ***, treated with ***.  She presents to the Survivorship Clinic for our initial meeting and routine follow-up post-completion of treatment for endometrial cancer.    1. Stage *** right/left breast cancer-:  Ms. Vanlenten is continuing to recover from definitive treatment for endometrial cancer. She will follow-up with her medical oncologist, Dr. Marland Kitchen in (month) /20** with history and physical  exam per surveillance protocol.  We discussed that this may include a physical exam every 3-6 months for 2-3 years, then 6 months - 1 year thereafter.  Blood work and imaging will be performed as clinically indicated. Today, a comprehensive survivorship care plan and treatment summary was reviewed with the patient detailing her endometrial cancer diagnosis, treatment course, potential late-long-term effects of treatment, appropriate follow-up care with recommendations for the future, and patient education resources.  A copy of this summary, along with a letter, will be sent to the patient's primary care provider via mail/fax/in-basket.  #. Problem(s) at Visit:   #. Obesity: We discussed that obese and overweight woman her 2-4 times this likely is normal weight woman to develop endometrial cancer and extremely obese woman her approximately 7 times is likely to develop endometrial cancer.  We also discussed that obesity may worsen several aspects of cancer survivorship including quality of life, cancer recurrence, cancer progression, and prognosis.  We discussed that a normal BMI is 18.5-24.9 kg/m2. Her current BMI is No height and weight on file for this encounter..   #. Smoking cessation:   #. Sexual health:   #. Bone health:  Given Ms. Woollard's age/history of breast cancer and her current treatment regimen including anti-estrogen therapy with _______, she is at risk for bone demineralization.  Her last DEXA scan was **/**/20**, which showed (results).***  In the meantime, she was encouraged to increase her consumption of foods rich in calcium, as well as increase her weight-bearing activities.  She was given education on specific activities to promote bone health. Continue to encourage supplementation of Calcium 1200mg  and Vitamin D 800 iu.   #. Cancer screening:  Due to Ms. Charland's history and her age, she should receive screening for skin cancers, colon cancer, and gynecologic cancers.  The  information and recommendations are listed on the patient's comprehensive care plan/treatment summary and were reviewed in detail with the patient.    #. Health maintenance and wellness promotion: Ms. Ferring was encouraged to consume 5-7 servings of fruits and vegetables per day. We reviewed the "Nutrition Rainbow" handout, as well as the handout "Take Control of Your Health and Reduce Your Cancer Risk" from the [facility].  She was also encouraged to engage in moderate to vigorous exercise for 30 minutes per day most days of the week. We discussed the [employer] fitness program, which is designed for cancer survivors to help them become more physically fit after cancer treatments.  She was  instructed to limit her alcohol consumption and continue to abstain from tobacco use/***was encouraged stop smoking.     #. Support services/counseling: It is not uncommon for this period of the patient's cancer care trajectory to be one of many emotions and stressors.  We discussed an opportunity for her to participate in the next session of [facility] ("Finding Your New Normal") support group series designed for patients after they have completed treatment.   Ms. Angle was encouraged to take advantage of our many other support services programs, support groups, and/or counseling in coping with her new life as a cancer survivor after completing anti-cancer treatment.  She was offered support today through active listening and expressive supportive counseling.  She was given information regarding our available services and encouraged to contact me with any questions or for help enrolling in any of our support group/programs.    Dispo:   -Return to cancer center as planned  -Follow up with surgery/gynecology oncology *** -Mammogram due in *** - DEXA scan due in *** -She is welcome to return back to the Survivorship Clinic at any time; no additional follow-up needed at this time.  -Consider referral back to  survivorship as a long-term survivor for continued surveillance  A total of (30) minutes of face-to-face time was spent with this patient with greater than 50% of that time in counseling and care-coordination. Rulon Abide, DNP AGNP-C Survivorship Program [facility] [place]   Note: PRIMARY CARE PROVIDER [name], MD [id number]

## 2018-12-14 ENCOUNTER — Ambulatory Visit: Payer: Medicaid Other

## 2018-12-29 ENCOUNTER — Telehealth: Payer: Self-pay | Admitting: Nurse Practitioner

## 2018-12-29 NOTE — Telephone Encounter (Signed)
Called patient to f/u on her missed appointments with pelvic floor pt and discuss options for management of her chronic pelvic pain including referral to palliative care at [facility]. No answer. Left message to return call.

## 2019-01-02 ENCOUNTER — Other Ambulatory Visit: Payer: Self-pay | Admitting: *Deleted

## 2019-01-02 MED ORDER — GABAPENTIN 300 MG PO CAPS: 300.0000 mg | ORAL_CAPSULE | Freq: Three times a day (TID) | ORAL | 0 refills | Status: DC

## 2019-01-23 ENCOUNTER — Telehealth: Payer: Self-pay

## 2019-01-23 NOTE — Telephone Encounter (Signed)
Call placed to Kristy Gordon to arrange appointment with Dr. Theora Gianotti to address her pelvic pain. Appointment made for 10/28 at 1000. She stated she is also due a port flush. Port flush appointment made.

## 2019-01-31 IMAGING — US US TRANSVAGINAL NON-OB
1 series · 13 of 25 positions shown · non-contrast
Comparison: None

CLINICAL DATA: Vaginal bleeding and pain

EXAM:
TRANSABDOMINAL AND TRANSVAGINAL ULTRASOUND OF PELVIS
TECHNIQUE: Both transabdominal and transvaginal ultrasound examinations of the
pelvis were performed. Transabdominal technique was performed for
global imaging of the pelvis including uterus, ovaries, adnexal
regions, and pelvic cul-de-sac. It was necessary to proceed with
endovaginal exam following the transabdominal exam to visualize the
uterus endometrium adnexa.

[Series 1: us transvaginal non-ob · 128 acquisitions, 13 frames shown]
[im 1/128]
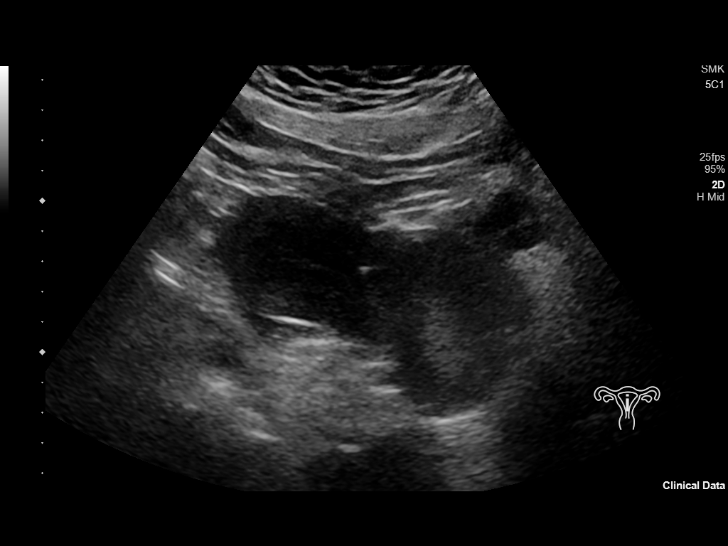
[im 11/128]
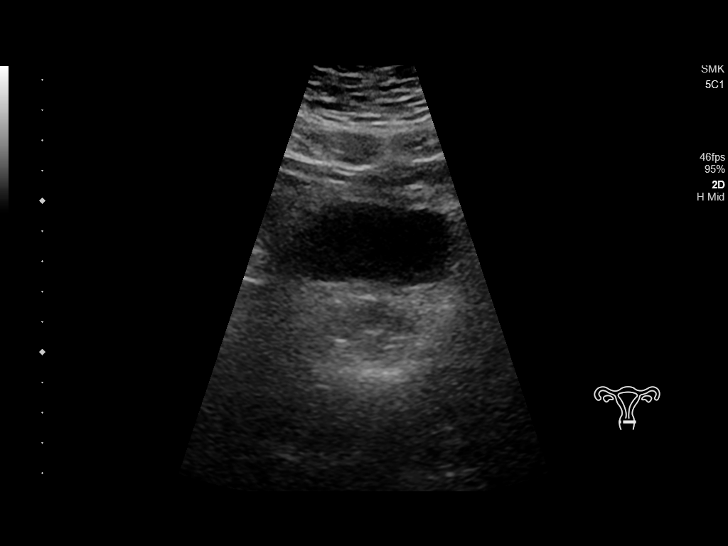
[im 22/128]
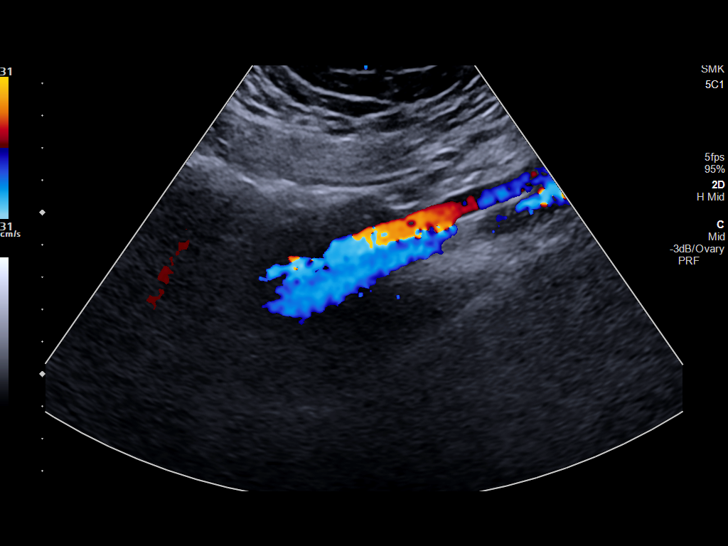
[im 32/128]
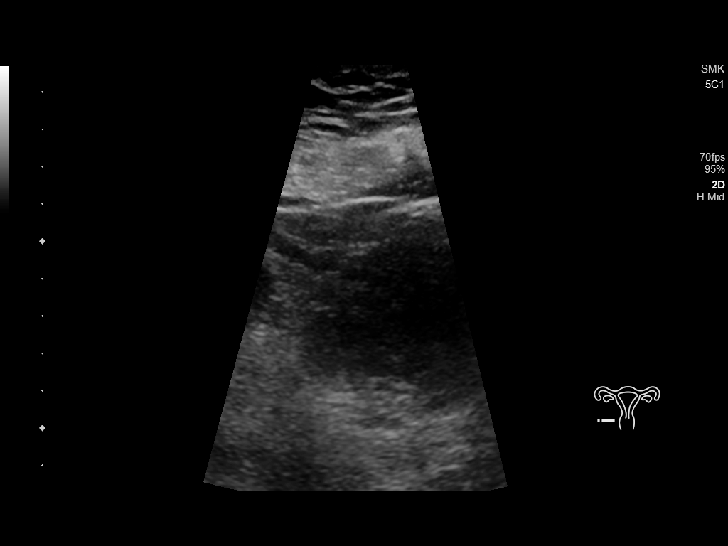
[im 43/128]
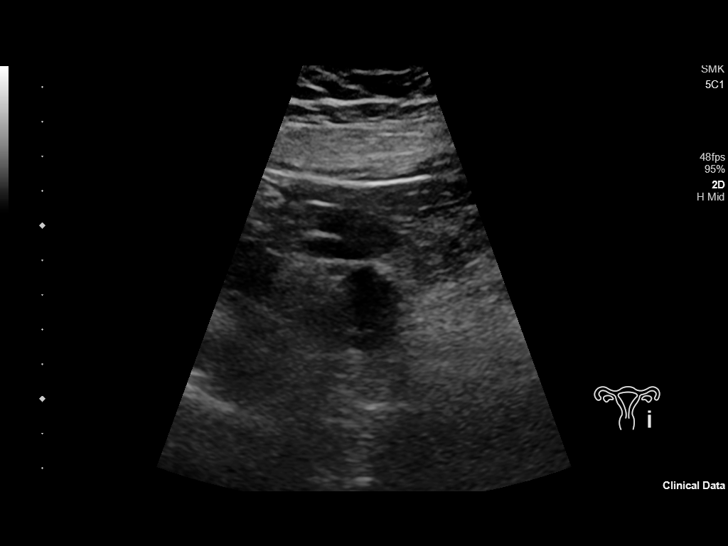
[im 53/128]
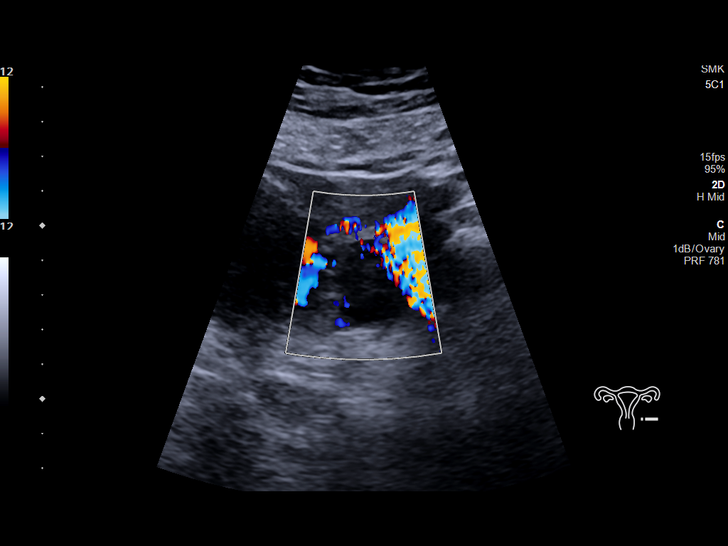
[im 64/128]
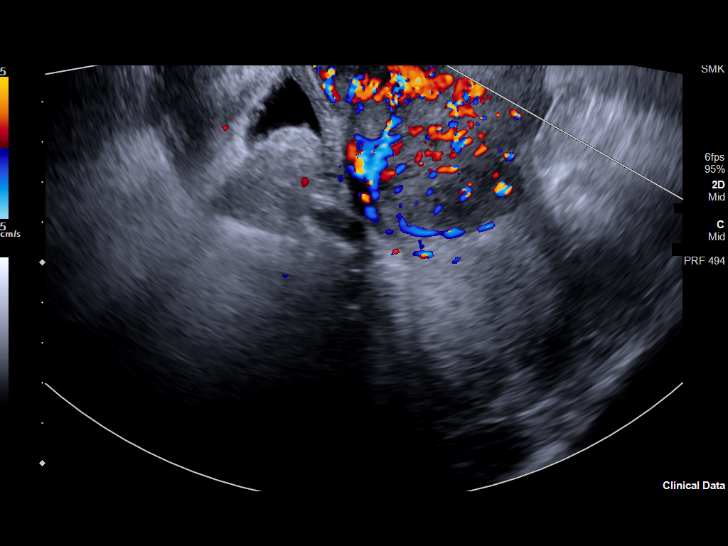
[im 75/128]
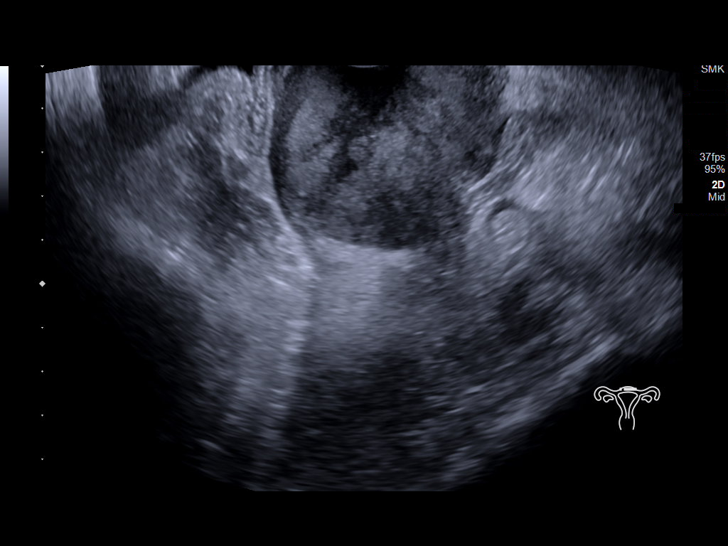
[im 85/128]
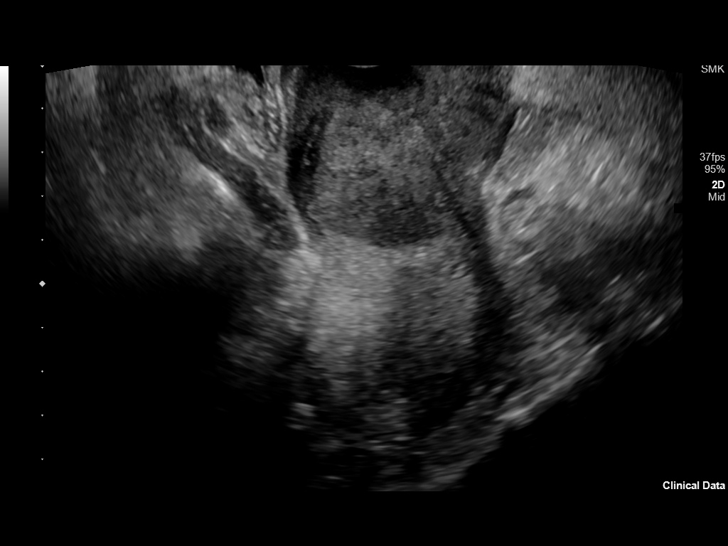
[im 96/128]
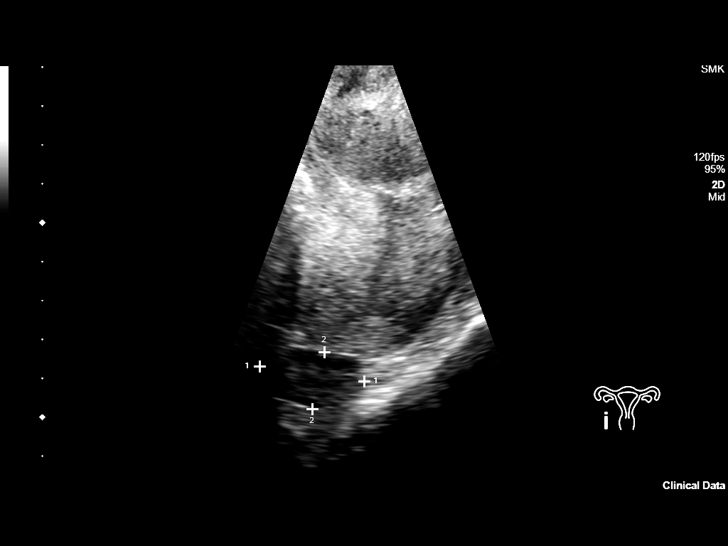
[im 106/128]
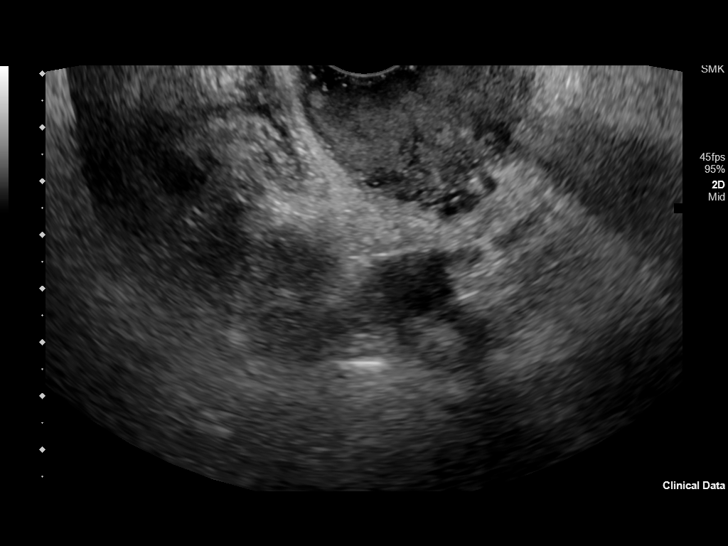
[im 117/128]
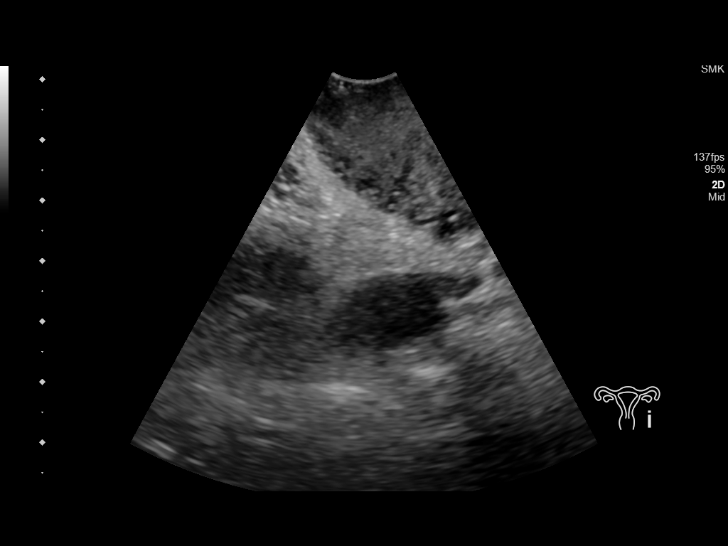
[im 128/128]
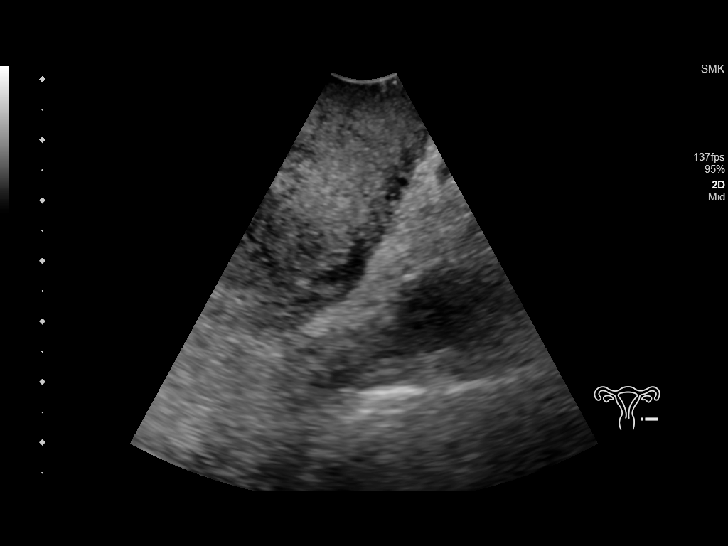

[13 of 25 positions shown; findings below may reference images not displayed]

FINDINGS: Uterus

Measurements: 9.6 cm length by 4.4 cm height by 4.6 cm wide =
volume: 100.96 mL. Large hypoechoic mass within the cervix/lower
uterine segment, this measures 5.8 x 4.6 x 5 cm and is vascular.

Endometrium

Thickness: 8.9 mm.  No focal abnormality visualized.

Right ovary

Measurements: 2.7 x 1.5 x 1.9 cm = volume: 4.1 mL. Normal
appearance/no adnexal mass.

Left ovary

Measurements: 2.3 x 1.2 x 1.4 cm = volume: 2 mL. Normal
appearance/no adnexal mass.

Other findings

Trace free fluid.
IMPRESSION: 1. Large 5.8 cm hypoechoic vascular mass within the cervix/lower
uterine segment. Gynecologic consultation is recommended.
2. Trace free fluid in the pelvis. Otherwise negative pelvic
ultrasound.

## 2019-01-31 NOTE — Progress Notes (Signed)
Prescreening Called placed. No answer. Unable to leave vm

## 2019-02-01 ENCOUNTER — Inpatient Hospital Stay: Payer: Medicaid Other

## 2019-02-01 ENCOUNTER — Inpatient Hospital Stay: Payer: Medicaid Other | Attending: Obstetrics and Gynecology | Admitting: Obstetrics and Gynecology

## 2019-02-01 ENCOUNTER — Other Ambulatory Visit: Payer: Self-pay

## 2019-02-01 ENCOUNTER — Other Ambulatory Visit: Payer: Self-pay | Admitting: *Deleted

## 2019-02-01 VITALS — BP 104/78 | HR 94 | Temp 98.3°F | Resp 16 | Wt 309.6 lb

## 2019-02-01 DIAGNOSIS — Z79899 Other long term (current) drug therapy: Secondary | ICD-10-CM | POA: Insufficient documentation

## 2019-02-01 DIAGNOSIS — E669 Obesity, unspecified: Secondary | ICD-10-CM | POA: Insufficient documentation

## 2019-02-01 DIAGNOSIS — Z79890 Hormone replacement therapy (postmenopausal): Secondary | ICD-10-CM | POA: Diagnosis not present

## 2019-02-01 DIAGNOSIS — Z9221 Personal history of antineoplastic chemotherapy: Secondary | ICD-10-CM | POA: Insufficient documentation

## 2019-02-01 DIAGNOSIS — E28319 Asymptomatic premature menopause: Secondary | ICD-10-CM | POA: Diagnosis not present

## 2019-02-01 DIAGNOSIS — C538 Malignant neoplasm of overlapping sites of cervix uteri: Secondary | ICD-10-CM

## 2019-02-01 DIAGNOSIS — Z95828 Presence of other vascular implants and grafts: Secondary | ICD-10-CM

## 2019-02-01 DIAGNOSIS — Z6841 Body Mass Index (BMI) 40.0 and over, adult: Secondary | ICD-10-CM | POA: Insufficient documentation

## 2019-02-01 DIAGNOSIS — F17210 Nicotine dependence, cigarettes, uncomplicated: Secondary | ICD-10-CM | POA: Diagnosis not present

## 2019-02-01 DIAGNOSIS — F4323 Adjustment disorder with mixed anxiety and depressed mood: Secondary | ICD-10-CM | POA: Insufficient documentation

## 2019-02-01 DIAGNOSIS — C539 Malignant neoplasm of cervix uteri, unspecified: Secondary | ICD-10-CM | POA: Insufficient documentation

## 2019-02-01 DIAGNOSIS — R1032 Left lower quadrant pain: Secondary | ICD-10-CM | POA: Diagnosis not present

## 2019-02-01 DIAGNOSIS — Z923 Personal history of irradiation: Secondary | ICD-10-CM | POA: Insufficient documentation

## 2019-02-01 DIAGNOSIS — E039 Hypothyroidism, unspecified: Secondary | ICD-10-CM | POA: Insufficient documentation

## 2019-02-01 DIAGNOSIS — E049 Nontoxic goiter, unspecified: Secondary | ICD-10-CM | POA: Insufficient documentation

## 2019-02-01 MED ORDER — SODIUM CHLORIDE 0.9% FLUSH
10.0000 mL | Freq: Once | INTRAVENOUS | Status: AC
  Administered 2019-02-01: 10 mL via INTRAVENOUS
  Filled 2019-02-01: qty 10

## 2019-02-01 MED ORDER — ESTRADIOL 0.1 MG/24HR TD PTTW: 1.0000 | MEDICATED_PATCH | TRANSDERMAL | 12 refills | Status: DC

## 2019-02-01 MED ORDER — HEPARIN SOD (PORK) LOCK FLUSH 100 UNIT/ML IV SOLN
500.0000 [IU] | Freq: Once | INTRAVENOUS | Status: AC
  Administered 2019-02-01: 10:00:00 500 [IU] via INTRAVENOUS

## 2019-02-01 NOTE — Progress Notes (Signed)
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 Positive pelvic LN  Subjective:  Kristy Gordon is a 33 y.o. G17 female, initially seen in consultation from Dr. Gilman Schmidt for cervical adenocarcinoma, returns to clinic for continued surveillance.   She is s/p 6 cycles of concurrent cisplatin and radiation 04/11/2018-05/19/2018 followed by vaginal brachytherapy at [facility] completed 06/07/2018.   Restaging pet showed no substantial hypermetabolism of cervix and resolution of hypermetabolic pelvic lymphadenopathy. Since completion of radiation she has complained of left lower quadrant pain. She has been using vaginal dilators and has been receiving pelvic floor PT which somewhat improved her pain though has missed several appointments.   CT in 11/22/2018 for vaginal bleeding and LLQ pain demonstrated  1. Unchanged post treatment appearance of the cervix and pelvis, with no change in tiny pelvic sidewall and iliac lymph nodes, previously enlarged and hypermetabolic. 2. No evidence of metastatic disease in the chest, abdomen, or pelvis. 3. No specific findings in the abdomen or pelvis to explain left lower quadrant pain. Large burden of stool in the colon. 4. Soft tissue in the anterior mediastinum, consistent with thymus and likely reflecting thymic rebound in the setting of recent chemotherapy.  12/02/2018 She saw Dr. Baruch Gouty and recommendation for start dilator therapy.   She presents with worsening pain, persistent hot flashes, and pain with dilator therapy. She had bleeding with dilator therapy. She has gone to Pelvic floor PT for 6-8 sessions. She was declined from the Pain clinic. She is gaining weight despite watching her diet and doing yoga.    She continues Vivelle-Dot 0.075 mg/24 hr with progesterone 200 mg 12 days of month. She previously complained of worsening hot flashes we we recommended she follow up with  her PCP regarding her thyroid disease. Her TSH was elevated and they recommended Increase levothyroxine from 125 to 175 mcg daily.  Ref Range & Units 67mo ago  TSH 0.450 - 4.500 uIU/mL 7.970High       Gyn-Onc history:  Kristy Gordon is a pleasant G18 female, initially seen in consultation from Dr. Gilman Schmidt.  She presented to the ER 03/15/18 for sharp right sided abdominal pain that feels like uterine cramps. This pain had been present for about 2 weeks.  She reported irregular bleeding and the ER ordered a pelvic US which showed a cervical mass.She reports that she has not been feeling well for the last 2-3 weeks. It has been since at least 2013 since she saw a gynecologist. She has not had a pap smear since then. She reports regular monthly periods every 28 days. She denies vaginal bleeding after intercourse.  No sexually active for 2-3 weeks.    Seen by Dr Gilman Schmidt 12/13 in office and and cervix firm and friable.  Multiple biopsies showed adenocarcinoma. PAP pending. States today that she has pain in left pelvis.   She reports that she is a smoker, she smokes about a pack per day. She has been smoking for 15 years.   03/23/2018 HIV Screen 4th Generation wRfx Non Reactive Non Reactive     She was seen by Dr. Fransisca Connors. Pelvic exam showed anteverted cervix that was replaced by tumor and extension of cancer into the left parametrium and possibly to the pelvic sidewall.We discussed that preservation of fertility is not possible with radiation treatment. She lost several pregnancies and does not have any children, but no longer desires fertility and husband confirmed this. So  this is not an issue.  PET CT scan on 03/28/2018 showed 6 cm hypermetabolic cervical mass consistent with primary cervical carcinoma. Mild hypermetabolic bilateral parametrial right perirectal bilateral iliac lymph nodes consistent with metastatic disease. No evidence of metastatic disease within the abdomen chest or  neck.  She received concurrent cisplatin & radiation 04/11/2018- 05/19/2018 (6 cycles) followed by vaginal brachytherapy at [facility] completed 06/07/2018.   PET - 08/31/2018 for restaging IMPRESSION: 1. Clear interval response to therapy. Cervix has decreased substantially in size in the interval with no substantial hypermetabolism today although SUV measurement is difficult due to misregistration with adjacent radiolabeled urine in the bladder. 2. Interval resolution of the hypermetabolic pelvic lymphadenopathy seen previously. 3. No new or progressive findings on today's study.  Since completion of radiation she continues to have left lower quadrant pain, hot flashes despite HRT, and pain with bleeding on intercourse. She started vaginal dilators and was referred for pelvic floor PT. She was started on HRT.   Problem List: Patient Active Problem List   Diagnosis Date Noted  . Vaginal pain 11/18/2018  . Premature menopause on hormone replacement therapy 10/20/2018  . Adjustment disorder with mixed anxiety and depressed mood 10/20/2018  . Goiter 05/20/2018  . Hypothyroidism 05/20/2018  . Tobacco use 05/20/2018  . Cervical cancer ([place]) 05/11/2018  . Neuropathy 05/07/2018  . Nausea without vomiting 04/25/2018  . Iron deficiency anemia 04/11/2018  . Goals of care, counseling/discussion 03/28/2018  . 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[place]) 03/23/2018  . Hypothyroidism due to Hashimoto's thyroiditis 03/18/2018  . H/O fetal demise, not currently pregnant 03/18/2018  . Lower abdominal pain 01/04/2018  . Anxiety 09/05/2015  . Clinical depression 09/05/2015  . Big thyroid 09/05/2015  . Cannot sleep 09/05/2015  . Adiposity 09/05/2015  . 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 Tobacco use disorder 09/05/2015    Past Medical History: Past Medical History:  Diagnosis Date  . Anxiety   . Hashimoto's thyroiditis   . 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 History of cervical cancer   . Hypothyroid   . Low back pain     Past Surgical History: Past Surgical History:  Procedure Laterality Date  . BUNIONECTOMY Right   . PORTA CATH INSERTION N/A 03/31/2018   Procedure: PORTA CATH INSERTION;  Surgeon: Algernon Huxley, MD;  Location: [facility] [facility];  Service: Cardiovascular;  Laterality: N/A;  . TONSILLECTOMY      OB History:  OB History  Gravida Para Term Preterm AB Living  10 2   2 8  0  SAB TAB Ectopic Multiple Live Births  8       0    # Outcome Date GA Lbr Len/2nd Weight Sex Delivery Anes PTL Lv  10 SAB           9 SAB           8 SAB           7 SAB           6 SAB           5 SAB           4 SAB           3 SAB           2 Preterm      Vag-Spont   FD  1 Preterm      Vag-Spont  FD    Family History: Family History  Adopted: Yes  Family history unknown: Yes    Social History: Social History   Socioeconomic History  . Marital status: Married    Spouse name: Not on file  . Number of children: Not on file  . Years of education: Not on file  . Highest education level: Not on file  Occupational History  . Not on file  Social Needs  . Financial resource strain: Not on file  . Food insecurity    Worry: Not on file    Inability: Not on file  . Transportation needs    Medical: Not on file    Non-medical: Not on file  Tobacco Use  . Smoking status: Current Some Day Smoker    Packs/day: 0.15    Years: 10.00    Pack years: 1.50    Types: Cigarettes  . Smokeless tobacco: Never Used  . Tobacco comment: 2 weeks to use a pack  Substance and Sexual Activity  . Alcohol use: Yes    Comment: rare  . Drug use: Yes    Types: Marijuana    Comment: quit opiates 5 years ago  . Sexual activity: Yes    Birth control/protection: None  Lifestyle  . Physical activity    Days per week: Not on file    Minutes per session: Not on file  . Stress: Not on file  Relationships  . Social Herbalist on  phone: Not on file    Gets together: Not on file    Attends religious service: Not on file    Active member of club or organization: Not on file    Attends meetings of clubs or organizations: Not on file    Relationship status: Not on file  . Intimate partner violence    Fear of current or ex partner: Not on file    Emotionally abused: Not on file    Physically abused: Not on file    Forced sexual activity: Not on file  Other Topics Concern  . Not on file  Social History Narrative  . Not on file    Allergies: Allergies  Allergen Reactions  . Amoxicillin Rash  . Penicillins Hives    Current Medications: Current Outpatient Medications  Medication Sig Dispense Refill  . acetaminophen (TYLENOL) 500 MG tablet Take 500 mg by mouth every 4 (four) hours as needed.    . ALPRAZolam (XANAX) 1 MG tablet TAKE 1 TABLET BY MOUTH THREE TIMES DAILY AS NEEDED 90 tablet 4  . [START ON 02/02/2019] estradiol (VIVELLE-DOT) 0.1 MG/24HR patch Place 1 patch (0.1 mg total) onto the skin 2 (two) times a week. 8 patch 12  . gabapentin (NEURONTIN) 300 MG capsule Take 1 capsule (300 mg total) by mouth 3 (three) times daily. 90 capsule 0  . levothyroxine (SYNTHROID) 175 MCG tablet Take 1 tablet (175 mcg total) by mouth daily before breakfast. 90 tablet 1  . lidocaine-prilocaine (EMLA) cream Apply to affected area once 30 g 3  . ondansetron (ZOFRAN) 8 MG tablet Take 8 mg by mouth every 8 (eight) hours as needed for nausea or vomiting.    . progesterone (PROMETRIUM) 200 MG capsule Take 200 mg by mouth at bedtime with food for 12 days sequentially per 28 day cycle. 12 capsule 11  . promethazine (PHENERGAN) 25 MG tablet TAKE 1 2 TO 1 (ONE HALF TO ONE) TABLET BY MOUTH EVERY 12 HOURS AS NEEDED FOR NAUSEA  No current facility-administered medications for this visit.    Facility-Administered Medications Ordered in Other Visits  Medication Dose Route Frequency Provider Last Rate Last Dose  . sodium chloride  flush (NS) 0.9 % injection 10 mL  10 mL Intravenous PRN Sindy Guadeloupe, MD   10 mL at 04/15/18 0825   Review of Systems General: no complaints; decreased appetite  HEENT: hearing loss due to chemotherapy  Lungs: shortness of breath  Cardiac: no complaints  GI: abdominal pain LLQ/bloating; sometimes nausea if pain severe  GU: bladder issues; vaginal discharge spotting/bleeding with dilator therapy  Musculoskeletal: back pain  Extremities: no complaints  Skin: no complaints  Neuro: weakness; neuropathy with numbness/tingling  Endocrine: no complaints  Psych: feeling sad     Objective:  Physical Examination:  BP 104/78 (BP Location: Right Arm, Patient Position: Sitting)   Pulse 94   Temp 98.3 F (36.8 C) (Temporal)   Resp 16   Wt (!) 309 lb 9.6 oz (140.4 kg)   SpO2 100%   BMI 48.49 kg/m    ECOG Performance Status: 1 - Symptomatic but completely ambulatory  GENERAL: Patient is a well appearing female in no acute distress HEENT:  PERRL, neck supple with midline trachea. Thyroid without masses.  NODES:  No cervical, supraclavicular, axillary, or inguinal lymphadenopathy palpated.  LUNGS:  Clear to auscultation bilaterally.  No wheezes or rhonchi. HEART:  Regular rate and rhythm. No murmur appreciated. ABDOMEN:  Soft, nondistended. Tender LLQ no rigidity, guarding or rebound. No ascites, hepatosplenomegaly, or masses.   MSK:  No focal spinal tenderness to palpation. Full range of motion bilaterally in the upper extremities. EXTREMITIES:  No peripheral edema.   SKIN:  Clear with no obvious rashes or skin changes. No nail dyscrasia. NEURO:  Nonfocal. Well oriented.  Appropriate affect.  Pelvic: EGBUS: no lesions Vagina: narrow vaginal vault; no lesions, no discharge or bleeding; agglutinated at the apex Cervix: only able to see anterior lip of the cervix; no lesions, Pap obtained and with Pap bleeding at the cervical os; the os is stenotic and endocervical Pap not able to be  performed; On palpation cervix is nontender and parametria smooth but very limited exam due to habitus.  Uterus: Not grossly enlarged; nontender Adnexa: no palpable masses but very limited exam due to habitus. Rectovaginal: deferred     Assessment:  Kristy Gordon is a 33 y.o. female diagnosed with at least stage IIB cervical adenocarcinoma s/p primary chemoradiation with excellent response based on PET scan and clinically NED on exam.   Left lower quadrant abdominal/pelvic pain, most likely secondary to prior cervical cancer and extension to the parametria, but concern given persistent symptoms. She had chronic pain but suspect acute element and symptoms concerning for recurrence.   Premature menopause and improved controlled vasomotor symptoms on current HRT regimen but not completely optimal.   Sexual function concerns and vaginal agglutination s/p pelvic floor PT with no improvement.   Body mass index is 48.49 kg/m.  Medical co-morbidities complicating care: anxiety, depression, thyroiditis, smoker.  Plan:   Problem List Items Addressed This Visit      Genitourinary   Cervical cancer ([place]) - Primary   Relevant Medications   estradiol (VIVELLE-DOT) 0.1 MG/24HR patch (Start on 02/02/2019)   ondansetron (ZOFRAN) 8 MG tablet   Other Relevant Orders   Pap liquid-based and HPV (high risk)   Ambulatory Referral to Palliative Care     Other   Premature menopause on hormone replacement therapy   Relevant  Medications   estradiol (VIVELLE-DOT) 0.1 MG/24HR patch (Start on 02/02/2019)    Other Visit Diagnoses    Left lower quadrant abdominal pain       Relevant Orders   Ambulatory Referral to Palliative Care     Repeat PET scan. Follow up Pap obtained today. If negative continue close surveillance with Dr. Baruch Gouty as scheduled 06/02/19 and return to our clinic in 6 months.  If positive we will discuss therapeutic options.   Refer to Dr. Billey Chang Palliative care for pain  management.   Discontine pelvic floor PT given her challenges and pain with the treatments. Continue dilator therapy if possible.    Repeat follow up with her PCP for thyroid testing  We will also increase Vivelle transdermal system to 0.1 mg/day. If she has persistent symptoms Dr. Gunnar Bulla previously provide 2 different options including transdermal ERT with Activella or switching to OCP.   We discussed exercise regimens to help with weight loss. She is interested in the CARES program but this has been stopped due to COVID-19 pandemic.   The patient's diagnosis, an outline of the further diagnostic and laboratory studies which will be required, the recommendation, and alternatives were discussed. We also reviewed her PET scan which was reassuring.   All questions were answered to the patient's satisfaction.  At least 40 minutes were spent with the patient/family today; at least 50% was spent in education, counseling and coordination of care for cervical cancer.   I performed the history and entire physical exam as well assessment, plan, and counseling. The note was scribed by Beckey Rutter, NP.  Angeles Gaetana Michaelis, MD       CC:  Dr Gilman Schmidt

## 2019-02-01 NOTE — Progress Notes (Signed)
Pt having left ear hearing loss-she has appt to see ent but could not go because she was sick.  Still having neuropathy. And pain in gyn area all the time

## 2019-02-02 ENCOUNTER — Encounter: Payer: Self-pay | Admitting: Nurse Practitioner

## 2019-02-02 DIAGNOSIS — Z23 Encounter for immunization: Secondary | ICD-10-CM | POA: Diagnosis not present

## 2019-02-03 ENCOUNTER — Other Ambulatory Visit: Payer: Self-pay

## 2019-02-03 ENCOUNTER — Encounter: Payer: Self-pay | Admitting: Hospice and Palliative Medicine

## 2019-02-03 ENCOUNTER — Inpatient Hospital Stay (HOSPITAL_BASED_OUTPATIENT_CLINIC_OR_DEPARTMENT_OTHER): Payer: Medicaid Other | Admitting: Hospice and Palliative Medicine

## 2019-02-03 VITALS — BP 135/88 | HR 93 | Temp 98.1°F | Ht 67.0 in | Wt 305.0 lb

## 2019-02-03 DIAGNOSIS — R102 Pelvic and perineal pain: Secondary | ICD-10-CM

## 2019-02-03 DIAGNOSIS — Z515 Encounter for palliative care: Secondary | ICD-10-CM

## 2019-02-03 DIAGNOSIS — G8929 Other chronic pain: Secondary | ICD-10-CM | POA: Diagnosis not present

## 2019-02-03 DIAGNOSIS — C539 Malignant neoplasm of cervix uteri, unspecified: Secondary | ICD-10-CM | POA: Diagnosis not present

## 2019-02-03 MED ORDER — DULOXETINE HCL 30 MG PO CPEP: ORAL_CAPSULE | ORAL | 3 refills | Status: DC

## 2019-02-03 NOTE — Progress Notes (Signed)
[place]  Telephone:([phone] [phone]   Name: Kristy Gordon Date: 02/03/2019 MRN: [id number]  DOB: 1985/08/19  Patient Care Team: [name], MD as PCP - General (Family Medicine) Mellody Drown, MD as Referring Physician (Obstetrics) Sindy Guadeloupe, MD as Consulting Physician (Oncology) Noreene Filbert, MD as Referring Physician (Radiation Oncology) Clent Jacks, RN as Registered Nurse Dew, Erskine Squibb, MD as Referring Physician (Vascular Surgery)    REASON FOR CONSULTATION: Palliative Care consult requested for this 33 y.o. 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  She is status post chemoradiation February 2020 and subsequently had vaginal brachytherapy.  Follow-up PET CT scan did not show evidence of metastatic disease with interval resolution of hypermetabolic pelvic adenopathy.  Patient has had persistent severe pelvic pain since her chemoradiation.  Patient has been using vaginal dilator intermittently to help with pain.  She has also had anxiety and depression.  Palliative care was consulted to help support patient with management of ongoing symptoms.  SOCIAL HISTORY:     reports that she has been smoking cigarettes. She has a 1.50 pack-year smoking history. She has never used smokeless tobacco. She reports current alcohol use. She reports current drug use. Drug: Marijuana.   Patient is married but separated.  She lives at home with her mother.  Her father is deceased from cancer.  She had another brother who is now deceased.  She is adopted.  Patient has no children.  She does not currently work.  She does engage in activities such as reading and writing and has a YouTube channel.  ADVANCE DIRECTIVES:  Does not have  CODE STATUS: Full code  PAST MEDICAL HISTORY: Past Medical History:  Diagnosis Date  . Anxiety   .  Hashimoto's thyroiditis   . 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 History of cervical cancer   . Hypothyroid   . Low back pain     PAST SURGICAL HISTORY:  Past Surgical History:  Procedure Laterality Date  . BUNIONECTOMY Right   . PORTA CATH INSERTION N/A 03/31/2018   Procedure: PORTA CATH INSERTION;  Surgeon: Algernon Huxley, MD;  Location: [facility];  Service: Cardiovascular;  Laterality: N/A;  . TONSILLECTOMY      HEMATOLOGY/ONCOLOGY HISTORY:  Oncology History Overview Note  Patient presented as a 33 year old, G0, to ER on 03/15/2018 with symptoms of abdominal pain and cramping.  She has also been having irregular menstrual bleeding and spotting on and off for the last few months.She had an ultrasound pelvis done in the ER which showed a large 5.8 cm hypoechoic vascular mass within the cervix and the lower uterine segment.  Patient had last seen GYN in 2013 and had not had a Pap smear done since then.  Patient was seen by GYN Dr. Nechama Guard on 1213 and was found to have firm friable cervix and multiple biopsies were taken which showed adenocarcinoma.  She was then seen by Dr. Fransisca Connors from GYN oncology. Pelvic exam showed anteverted cervix that was replaced by tumor and extension of cancer into the left parametrium and possibly to the pelvic sidewall.    PET CT scan on 03/28/2018 showed 6 cm hypermetabolic cervical mass consistent with primary cervical carcinoma.  Mild hypermetabolic bilateral parametrial, right perirectal,  bilateral iliac lymph nodes,  consistent with metastatic disease.  No evidence of metastatic disease within  the abdomen, chest, or neck.   Cycle 1 of weekly cisplatin and radiation started on 04/11/2018. She completed chemo-radiation on 05/19/2018 followed by vaginal brachytherapy at [facility]  PET for restaging on 08/31/2018 showed clear interval response to therapy, cervix is decreased substantially in size with no substantial hypermetabolism today, interval  resolution of the hypermetabolic pelvic lymphadenopathy seen previously.   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[place])  03/23/2018 Initial Diagnosis   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[place])   04/01/2018 Cancer Staging   Staging form: Cervix Uteri, AJCC 8th Edition - Clinical stage from 04/01/2018: FIGO Stage III (cT3, cN1, cM0) - Signed by Sindy Guadeloupe, MD on 04/01/2018   04/11/2018 -  Chemotherapy   The patient had palonosetron (ALOXI) injection 0.25 mg, 0.25 mg, Intravenous,  Once, 6 of 6 cycles Administration: 0.25 mg (04/11/2018), 0.25 mg (04/19/2018), 0.25 mg (04/27/2018), 0.25 mg (05/04/2018), 0.25 mg (05/12/2018), 0.25 mg (05/19/2018) CISplatin (PLATINOL) 90 mg in sodium chloride 0.9 % 250 mL chemo infusion, 40 mg/m2 = 90 mg, Intravenous,  Once, 6 of 6 cycles Administration: 90 mg (04/11/2018), 90 mg (04/27/2018), 90 mg (04/20/2018), 90 mg (05/04/2018), 90 mg (05/12/2018), 90 mg (05/19/2018) fosaprepitant (EMEND) 150 mg, dexamethasone (DECADRON) 12 mg in sodium chloride 0.9 % 145 mL IVPB, , Intravenous,  Once, 6 of 6 cycles Administration:  (04/11/2018),  (04/27/2018),  (04/20/2018),  (05/04/2018),  (05/12/2018),  (05/19/2018)  for chemotherapy treatment.      ALLERGIES:  is allergic to amoxicillin and penicillins.  MEDICATIONS:  Current Outpatient Medications  Medication Sig Dispense Refill  . acetaminophen (TYLENOL) 500 MG tablet Take 500 mg by mouth every 4 (four) hours as needed.    . ALPRAZolam (XANAX) 1 MG tablet TAKE 1 TABLET BY MOUTH THREE TIMES DAILY AS NEEDED 90 tablet 4  . estradiol (VIVELLE-DOT) 0.1 MG/24HR patch Place 1 patch (0.1 mg total) onto the skin 2 (two) times a week. 8 patch 12  . gabapentin (NEURONTIN) 300 MG capsule Take 1 capsule (300 mg total) by mouth 3 (three) times daily. 90 capsule 0  . levothyroxine (SYNTHROID) 175 MCG tablet Take 1 tablet (175 mcg total) by mouth daily before breakfast. 90 tablet 1  . lidocaine-prilocaine (EMLA) cream Apply to affected area once 30 g 3  . ondansetron  (ZOFRAN) 8 MG tablet Take 8 mg by mouth every 8 (eight) hours as needed for nausea or vomiting.    . progesterone (PROMETRIUM) 200 MG capsule Take 200 mg by mouth at bedtime with food for 12 days sequentially per 28 day cycle. 12 capsule 11  . promethazine (PHENERGAN) 25 MG tablet TAKE 1 2 TO 1 (ONE HALF TO ONE) TABLET BY MOUTH EVERY 12 HOURS AS NEEDED FOR NAUSEA     No current facility-administered medications for this visit.    Facility-Administered Medications Ordered in Other Visits  Medication Dose Route Frequency Provider Last Rate Last Dose  . sodium chloride flush (NS) 0.9 % injection 10 mL  10 mL Intravenous PRN Sindy Guadeloupe, MD   10 mL at 04/15/18 0825    VITAL SIGNS: There were no vitals taken for this visit. There were no vitals filed for this visit.  Estimated body mass index is 48.49 kg/m as calculated from the following:   Height as of 11/08/18: 5\' 7"  (1.702 m).   Weight as of 02/01/19: 309 lb 9.6 oz (140.4 kg).  LABS: CBC:    Component Value Date/Time   WBC 5.3 11/14/2018 1158   HGB 12.8 11/14/2018 1158   HGB 14.7  09/11/2015 0954   HCT 36.5 11/14/2018 1158   HCT 42.8 09/11/2015 0954   PLT 367 11/14/2018 1158   PLT 462 (H) 09/11/2015 0954   MCV 92.4 11/14/2018 1158   MCV 92 09/11/2015 0954   MCV 93 09/11/2011 1701   NEUTROABS 2.8 11/14/2018 1158   NEUTROABS 6.2 09/11/2015 0954   LYMPHSABS 1.6 11/14/2018 1158   LYMPHSABS 4.7 (H) 09/11/2015 0954   MONOABS 0.7 11/14/2018 1158   EOSABS 0.1 11/14/2018 1158   EOSABS 0.1 09/11/2015 0954   BASOSABS 0.1 11/14/2018 1158   BASOSABS 0.1 09/11/2015 0954   Comprehensive Metabolic Panel:    Component Value Date/Time   NA 136 11/14/2018 1158   NA 138 09/11/2015 0954   NA 142 09/11/2011 1701   K 3.9 11/14/2018 1158   K 4.2 09/11/2011 1701   CL 103 11/14/2018 1158   CL 109 (H) 09/11/2011 1701   CO2 25 11/14/2018 1158   CO2 22 09/11/2011 1701   BUN 10 11/14/2018 1158   BUN 11 09/11/2015 0954   BUN 10 09/11/2011  1701   CREATININE 0.74 11/14/2018 1158   CREATININE 0.67 09/11/2011 1701   GLUCOSE 103 (H) 11/14/2018 1158   GLUCOSE 83 09/11/2011 1701   CALCIUM 9.2 11/14/2018 1158   CALCIUM 9.3 09/11/2011 1701   AST 30 11/14/2018 1158   AST 28 09/11/2011 1701   ALT 36 11/14/2018 1158   ALT 20 09/11/2011 1701   ALKPHOS 79 11/14/2018 1158   ALKPHOS 101 09/11/2011 1701   BILITOT 0.3 11/14/2018 1158   BILITOT 0.5 09/11/2015 0954   BILITOT 0.3 09/11/2011 1701   PROT 7.9 11/14/2018 1158   PROT 8.2 09/11/2015 0954   PROT 6.9 09/11/2011 1701   ALBUMIN 3.8 11/14/2018 1158   ALBUMIN 4.8 09/11/2015 0954   ALBUMIN 2.8 (L) 09/11/2011 1701    RADIOGRAPHIC STUDIES: No results found.  PERFORMANCE STATUS (ECOG) : 1 - Symptomatic but completely ambulatory  Review of Systems Unless otherwise noted, a complete review of systems is negative.  Physical Exam General: NAD Pulmonary: Unlabored Extremities: no edema Skin: no rashes Neurological: Nonfocal  IMPRESSION: Patient endorses severe and persistent left lower pelvic pain.  Pain is described as crampy and stabbing.  Pain is worse with movement and heavy exertion such as yoga.  She is supposed to use vaginal dilator daily but cannot bear the pain.  Instead she uses it weekly.  She says the pain will persist several days following vaginal dilation.  Patient has history of substance use disorder.  She reports heavy use of opioids from 2014-2015 following a musculoskeletal injury and the subsequent death of her brother.  She was ultimately referred to a methadone clinic and was successfully weaned from methadone in 2018.  Patient denies current use of any illicit substances.  Patient says she is not interested in any pain management strategies that include opiates/opioids given her history of addiction.  In the past patient has received Toradol while she was receiving chemoradiation.  Patient says that the Toradol was the most effective pain management  strategy today.  She has also tried occasional acetaminophen and Motrin but is no longer taking either.  Given patient's goal to avoid opioid pain medications and the chronic nature of her pain, I would recommend evaluation by pain management clinic.  Patient was referred to [facility] pain clinic but referral was declined.  I think it would be reasonable to explore option of a hypogastric plexus block to see if that could improve patient's pain  without use of medications.   Patient was started on gabapentin and titrated to 300 mg 3 times daily.  She does not feel that improved her pain but thinks that it might have helped her hot flashes.  Patient continues to have anxiety and depression.  She reports having been tried previously on several SSRIs by her PCP without good effect.  We will start trial of duloxetine and titrate to 60 mg daily.  It is my hope that this may help her depression/anxiety and may also improve vasomotor symptoms such as hot flashes.  Duloxetine may also help pain if there is a neuropathic component.  Case and plan discussed with Dr. Janese Banks.  PLAN: -Referral to pain management for consideration of a plexus nerve block in setting of severe and persistent pelvic pain -I have called [facility] about referral -Continue gabapentin 300 mg 3 times daily -Start duloxetine 30 mg daily x1 week and then 60 mg daily thereafter -May use acetaminophen and ibuprofen as needed -Follow-up telephone visit in 1 month   Patient expressed understanding and was in agreement with this plan. She also understands that She can call the clinic at any time with any questions, concerns, or complaints.     Time Total: 30 minutes  Visit consisted of counseling and education dealing with the complex and emotionally intense issues of symptom management and palliative care in the setting of serious and potentially life-threatening illness.Greater than 50%  of this time was spent counseling and  coordinating care related to the above assessment and plan.  Signed by: Altha Harm, PhD, NP-C

## 2019-02-06 ENCOUNTER — Encounter: Payer: Self-pay | Admitting: Nurse Practitioner

## 2019-02-06 ENCOUNTER — Encounter: Payer: Self-pay | Admitting: Family Medicine

## 2019-02-07 ENCOUNTER — Ambulatory Visit: Payer: Medicaid Other

## 2019-02-07 ENCOUNTER — Telehealth: Payer: Self-pay

## 2019-02-07 ENCOUNTER — Encounter: Payer: Self-pay | Admitting: Physician Assistant

## 2019-02-07 ENCOUNTER — Ambulatory Visit (INDEPENDENT_AMBULATORY_CARE_PROVIDER_SITE_OTHER): Payer: Medicaid Other | Admitting: Physician Assistant

## 2019-02-07 ENCOUNTER — Other Ambulatory Visit: Payer: Self-pay

## 2019-02-07 VITALS — BP 143/88 | HR 110 | Temp 96.9°F | Resp 16 | Wt 305.0 lb

## 2019-02-07 DIAGNOSIS — R300 Dysuria: Secondary | ICD-10-CM

## 2019-02-07 DIAGNOSIS — N3090 Cystitis, unspecified without hematuria: Secondary | ICD-10-CM | POA: Diagnosis not present

## 2019-02-07 LAB — POCT URINALYSIS DIPSTICK
Bilirubin, UA: NEGATIVE
Glucose, UA: NEGATIVE
Ketones, UA: NEGATIVE
Leukocytes, UA: NEGATIVE
Nitrite, UA: NEGATIVE
Protein, UA: POSITIVE — AB
Spec Grav, UA: >=1.03 — AB (ref 1.010–1.025)
Urobilinogen, UA: 0.2 E.U./dL
pH, UA: 5 (ref 5.0–8.0)

## 2019-02-07 MED ORDER — SULFAMETHOXAZOLE-TRIMETHOPRIM 800-160 MG PO TABS: 1.0000 | ORAL_TABLET | Freq: Two times a day (BID) | ORAL | 0 refills | Status: AC

## 2019-02-07 NOTE — Telephone Encounter (Signed)
Altha Harm, NP would like for [place] to refer patient to [facility] since our group did not feel that patient could be seen at their office. I called [facility] and I was given a fax [phone]) and attention to [facility] (referral coordinator). They stated that I should send patient's demographics, insurance and office notes. Then Estill Bamberg will cotact patient to schedule an appointment.

## 2019-02-07 NOTE — Progress Notes (Signed)
Patient: Kristy Gordon Female    DOB: [birth year]   33 y.o.   MRN: [id number] Visit Date: 02/07/2019  Today's Provider: Trinna Post, PA-C   Chief Complaint  Patient presents with  . Dysuria   Subjective:    Patient is currently undergoing treatment for cervical cancer. She denies sexual activity x 1 year.   Dysuria  This is a new problem. The current episode started in the past 7 days. The problem occurs every urination. The problem has been gradually worsening. The quality of the pain is described as burning. The pain is moderate. There has been no fever. She is not sexually active. Associated symptoms include flank pain, frequency, nausea, sweats, urgency and vomiting. Pertinent negatives include no chills, discharge, hematuria, hesitancy or possible pregnancy. She has tried nothing for the symptoms.      Allergies  Allergen Reactions  . Amoxicillin Rash  . Penicillins Hives     Current Outpatient Medications:  .  acetaminophen (TYLENOL) 500 MG tablet, Take 500 mg by mouth every 4 (four) hours as needed., Disp: , Rfl:  .  ALPRAZolam (XANAX) 1 MG tablet, TAKE 1 TABLET BY MOUTH THREE TIMES DAILY AS NEEDED, Disp: 90 tablet, Rfl: 4 .  DULoxetine (CYMBALTA) 30 MG capsule, Take one capsule by mouth daily x 1 week and then 2 capsules daily thereafter., Disp: 60 capsule, Rfl: 3 .  estradiol (VIVELLE-DOT) 0.1 MG/24HR patch, Place 1 patch (0.1 mg total) onto the skin 2 (two) times a week., Disp: 8 patch, Rfl: 12 .  gabapentin (NEURONTIN) 300 MG capsule, Take 1 capsule (300 mg total) by mouth 3 (three) times daily., Disp: 90 capsule, Rfl: 0 .  levothyroxine (SYNTHROID) 175 MCG tablet, Take 1 tablet (175 mcg total) by mouth daily before breakfast., Disp: 90 tablet, Rfl: 1 .  lidocaine-prilocaine (EMLA) cream, Apply to affected area once, Disp: 30 g, Rfl: 3 .  ondansetron (ZOFRAN) 8 MG tablet, Take 8 mg by mouth every 8 (eight) hours as needed for nausea or vomiting., Disp: , Rfl:   .  progesterone (PROMETRIUM) 200 MG capsule, Take 200 mg by mouth at bedtime with food for 12 days sequentially per 28 day cycle., Disp: 12 capsule, Rfl: 11 .  promethazine (PHENERGAN) 25 MG tablet, TAKE 1 2 TO 1 (ONE HALF TO ONE) TABLET BY MOUTH EVERY 12 HOURS AS NEEDED FOR NAUSEA, Disp: , Rfl:  .  sulfamethoxazole-trimethoprim (BACTRIM DS) 800-160 MG tablet, Take 1 tablet by mouth 2 (two) times daily for 3 days., Disp: 6 tablet, Rfl: 0 No current facility-administered medications for this visit.   Facility-Administered Medications Ordered in Other Visits:  .  sodium chloride flush (NS) 0.9 % injection 10 mL, 10 mL, Intravenous, PRN, Sindy Guadeloupe, MD, 10 mL at 04/15/18 0825  Review of Systems  Constitutional: Negative.  Negative for chills.  Respiratory: Negative.   Gastrointestinal: Positive for nausea and vomiting.  Genitourinary: Positive for dysuria, flank pain, frequency and urgency. Negative for hematuria and hesitancy.  Hematological: Negative.     Social History   Tobacco Use  . Smoking status: Current Some Day Smoker    Packs/day: 0.15    Years: 10.00    Pack years: 1.50    Types: Cigarettes  . Smokeless tobacco: Never Used  . Tobacco comment: 2 weeks to use a pack  Substance Use Topics  . Alcohol use: Yes    Comment: rare      Objective:   BP Marland Kitchen)  143/88   Pulse (!) 110   Temp (!) 96.9 F (36.1 C) (Oral)   Resp 16   Wt (!) 305 lb (138.3 kg)   BMI 47.77 kg/m  Vitals:   02/07/19 1352  BP: (!) 143/88  Pulse: (!) 110  Resp: 16  Temp: (!) 96.9 F (36.1 C)  TempSrc: Oral  Weight: (!) 305 lb (138.3 kg)  Body mass index is 47.77 kg/m.   Physical Exam Constitutional:      General: She is not in acute distress.    Appearance: She is well-developed. She is not diaphoretic.  Cardiovascular:     Rate and Rhythm: Normal rate and regular rhythm.  Pulmonary:     Effort: Pulmonary effort is normal.     Breath sounds: Normal breath sounds.  Abdominal:      General: Bowel sounds are normal. There is no distension.     Palpations: Abdomen is soft.     Tenderness: There is no abdominal tenderness. There is no guarding or rebound.  Skin:    General: Skin is warm and dry.  Neurological:     Mental Status: She is alert and oriented to person, place, and time.  Psychiatric:        Mood and Affect: Mood normal.        Behavior: Behavior normal.      Results for orders placed or performed in visit on 02/07/19  POCT urinalysis dipstick  Result Value Ref Range   Color, UA yellow    Clarity, UA clear    Glucose, UA Negative Negative   Bilirubin, UA negative    Ketones, UA negative    Spec Grav, UA >=1.030 (A) 1.010 - 1.025   Blood, UA large    pH, UA 5.0 5.0 - 8.0   Protein, UA Positive (A) Negative   Urobilinogen, UA 0.2 0.2 or 1.0 E.U./dL   Nitrite, UA negative    Leukocytes, UA Negative Negative   Appearance     Odor         Assessment & Plan    1. Cystitis  Will treat empirically as below and send urine for cx. Follow up pending cx.  - sulfamethoxazole-trimethoprim (BACTRIM DS) 800-160 MG tablet; Take 1 tablet by mouth 2 (two) times daily for 3 days.  Dispense: 6 tablet; Refill: 0  2. Dysuria  - POCT urinalysis dipstick - Urine Culture  The entirety of the information documented in the History of Present Illness, Review of Systems and Physical Exam were personally obtained by me. Portions of this information were initially documented by Jennings Books, CMA and reviewed by me for thoroughness and accuracy.   F/u PRN       Trinna Post, PA-C  [facility]

## 2019-02-07 NOTE — Patient Instructions (Signed)

## 2019-02-08 ENCOUNTER — Encounter: Payer: Self-pay | Admitting: Nurse Practitioner

## 2019-02-08 ENCOUNTER — Other Ambulatory Visit: Payer: Self-pay | Admitting: Oncology

## 2019-02-08 LAB — PAP LB AND HPV HIGH-RISK: HPV, high-risk: NEGATIVE

## 2019-02-09 ENCOUNTER — Telehealth: Payer: Self-pay | Admitting: *Deleted

## 2019-02-09 LAB — URINE CULTURE

## 2019-02-09 NOTE — Telephone Encounter (Signed)
[facility] is not able to accept the referral at this time. Office Not able to perform the plexus nerve blocks." They would like prescribe narcotics to the patient, but given her naroctic history of dependency, this is not advisable. The referral coordinator politely declined apt for this reason and she asked that Kristy Chang, NP be made aware.

## 2019-02-09 NOTE — Telephone Encounter (Signed)
Kristy Gordon, I will send the referral to "[facility] Hopefully they will accept the referral. I will let you know. Referral faxed at: [phone]

## 2019-02-10 ENCOUNTER — Encounter: Payer: Self-pay | Admitting: Obstetrics and Gynecology

## 2019-02-13 NOTE — Telephone Encounter (Signed)
All [facility] and had to leave a voicemail to call me back. I need to know if patient was scheduled (accepted) or not.

## 2019-02-14 ENCOUNTER — Encounter: Payer: Self-pay | Admitting: Nurse Practitioner

## 2019-02-15 ENCOUNTER — Ambulatory Visit: Payer: Medicaid Other

## 2019-02-16 NOTE — Telephone Encounter (Signed)
Called [facility] and I was told that patient would be called today to schedule an appointment.

## 2019-02-20 ENCOUNTER — Other Ambulatory Visit: Payer: Self-pay

## 2019-02-20 ENCOUNTER — Ambulatory Visit: Payer: Medicaid Other

## 2019-02-20 ENCOUNTER — Ambulatory Visit
Admission: RE | Admit: 2019-02-20 | Discharge: 2019-02-20 | Disposition: A | Discharge status: Home / Self Care | Payer: Medicaid Other | Source: Ambulatory Visit | Attending: Obstetrics and Gynecology | Admitting: Obstetrics and Gynecology

## 2019-02-20 DIAGNOSIS — C539 Malignant neoplasm of cervix uteri, unspecified: Secondary | ICD-10-CM | POA: Insufficient documentation

## 2019-02-20 DIAGNOSIS — C760 Malignant neoplasm of head, face and neck: Secondary | ICD-10-CM | POA: Diagnosis not present

## 2019-02-20 LAB — GLUCOSE, CAPILLARY: Glucose-Capillary: 104 mg/dL — ABNORMAL HIGH (ref 70–99)

## 2019-02-20 MED ORDER — FLUDEOXYGLUCOSE F - 18 (FDG) INJECTION
15.8000 | Freq: Once | INTRAVENOUS | Status: AC | PRN
  Administered 2019-02-20: 16.61 via INTRAVENOUS

## 2019-02-22 ENCOUNTER — Encounter: Payer: Self-pay | Admitting: Obstetrics and Gynecology

## 2019-02-27 ENCOUNTER — Encounter: Payer: Self-pay | Admitting: Radiation Oncology

## 2019-02-27 ENCOUNTER — Encounter: Payer: Self-pay | Admitting: Nurse Practitioner

## 2019-02-27 ENCOUNTER — Other Ambulatory Visit: Payer: Self-pay

## 2019-02-27 ENCOUNTER — Inpatient Hospital Stay: Payer: Medicaid Other | Attending: Nurse Practitioner | Admitting: Nurse Practitioner

## 2019-02-27 VITALS — BP 119/84 | HR 112 | Temp 98.6°F | Resp 18 | Wt 309.0 lb

## 2019-02-27 DIAGNOSIS — E669 Obesity, unspecified: Secondary | ICD-10-CM | POA: Diagnosis not present

## 2019-02-27 DIAGNOSIS — R3129 Other microscopic hematuria: Secondary | ICD-10-CM | POA: Diagnosis not present

## 2019-02-27 DIAGNOSIS — C539 Malignant neoplasm of cervix uteri, unspecified: Secondary | ICD-10-CM | POA: Insufficient documentation

## 2019-02-27 DIAGNOSIS — M545 Low back pain: Secondary | ICD-10-CM | POA: Diagnosis not present

## 2019-02-27 DIAGNOSIS — R109 Unspecified abdominal pain: Secondary | ICD-10-CM | POA: Diagnosis not present

## 2019-02-27 DIAGNOSIS — N3289 Other specified disorders of bladder: Secondary | ICD-10-CM | POA: Diagnosis not present

## 2019-02-27 DIAGNOSIS — G8929 Other chronic pain: Secondary | ICD-10-CM | POA: Diagnosis not present

## 2019-02-27 DIAGNOSIS — F17210 Nicotine dependence, cigarettes, uncomplicated: Secondary | ICD-10-CM | POA: Insufficient documentation

## 2019-02-27 DIAGNOSIS — N8184 Pelvic muscle wasting: Secondary | ICD-10-CM | POA: Insufficient documentation

## 2019-02-27 LAB — URINALYSIS, COMPLETE (UACMP) WITH MICROSCOPIC
Bacteria, UA: NONE SEEN
Bilirubin Urine: NEGATIVE
Glucose, UA: NEGATIVE mg/dL
Ketones, ur: NEGATIVE mg/dL
Leukocytes,Ua: NEGATIVE
Nitrite: NEGATIVE
Protein, ur: 30 mg/dL — AB
Specific Gravity, Urine: 1.029 (ref 1.005–1.030)
pH: 5 (ref 5.0–8.0)

## 2019-02-27 MED ORDER — PHENAZOPYRIDINE HCL 200 MG PO TABS: 200.0000 mg | ORAL_TABLET | Freq: Three times a day (TID) | ORAL | 0 refills | Status: AC | PRN

## 2019-02-27 NOTE — Progress Notes (Signed)
Symptom Management [place]  [phone]) [phone] [phone]) [phone]  Patient Care Team: [name], MD as PCP - General (Family Medicine) Mellody Drown, MD as Referring Physician (Obstetrics) Sindy Guadeloupe, MD as Consulting Physician (Oncology) Noreene Filbert, MD as Referring Physician (Radiation Oncology) Clent Jacks, RN as Registered Nurse Lucky Cowboy Erskine Squibb, MD as Referring Physician (Vascular Surgery)   Name of the patient: Kristy Bode  [id number]  1985-04-19   Date of visit: 02/27/19  Diagnosis-cervical cancer  Chief complaint/ Reason for visit-painful bladder spasms and hematuria  Heme/Onc history:  Oncology History Overview Note  Patient presented as a 33 year old, G0, to ER on 03/15/2018 with symptoms of abdominal pain and cramping.  She has also been having irregular menstrual bleeding and spotting on and off for the last few months.She had an ultrasound pelvis done in the ER which showed a large 5.8 cm hypoechoic vascular mass within the cervix and the lower uterine segment.  Patient had last seen GYN in 2013 and had not had a Pap smear done since then.  Patient was seen by GYN Dr. Nechama Guard on 1213 and was found to have firm friable cervix and multiple biopsies were taken which showed adenocarcinoma.  She was then seen by Dr. Fransisca Connors from GYN oncology. Pelvic exam showed anteverted cervix that was replaced by tumor and extension of cancer into the left parametrium and possibly to the pelvic sidewall.    PET CT scan on 03/28/2018 showed 6 cm hypermetabolic cervical mass consistent with primary cervical carcinoma.  Mild hypermetabolic bilateral parametrial, right perirectal,  bilateral iliac lymph nodes,  consistent with metastatic disease.  No evidence of metastatic disease within the abdomen, chest, or neck.   Cycle 1 of weekly cisplatin and radiation started on 04/11/2018. She completed chemo-radiation on 05/19/2018 followed by  vaginal brachytherapy at [facility].  PET for restaging on 08/31/2018 showed clear interval response to therapy, cervix is decreased substantially in size with no substantial hypermetabolism today, interval resolution of the hypermetabolic pelvic lymphadenopathy seen previously.   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[place])   04/01/2018 Cancer Staging   Staging form: Cervix Uteri, AJCC 8th Edition - Clinical stage from 04/01/2018: FIGO Stage III (cT3, cN1, cM0) - Signed by Sindy Guadeloupe, MD on 04/01/2018   04/11/2018 -  Chemotherapy   The patient had palonosetron (ALOXI) injection 0.25 mg, 0.25 mg, Intravenous,  Once, 6 of 6 cycles Administration: 0.25 mg (04/11/2018), 0.25 mg (04/19/2018), 0.25 mg (04/27/2018), 0.25 mg (05/04/2018), 0.25 mg (05/12/2018), 0.25 mg (05/19/2018) CISplatin (PLATINOL) 90 mg in sodium chloride 0.9 % 250 mL chemo infusion, 40 mg/m2 = 90 mg, Intravenous,  Once, 6 of 6 cycles Administration: 90 mg (04/11/2018), 90 mg (04/27/2018), 90 mg (04/20/2018), 90 mg (05/04/2018), 90 mg (05/12/2018), 90 mg (05/19/2018) fosaprepitant (EMEND) 150 mg, dexamethasone (DECADRON) 12 mg in sodium chloride 0.9 % 145 mL IVPB, , Intravenous,  Once, 6 of 6 cycles Administration:  (04/11/2018),  (04/27/2018),  (04/20/2018),  (05/04/2018),  (05/12/2018),  (05/19/2018)  for chemotherapy treatment.      Interval history- Kristy Gordon, 33 year old female with above history of cervical cancer status post chemo and radiation with brachytherapy completed in March 2020 presents to symptom management clinic for complaints of painful bladder spasms and hematuria.  She said that approximately 3 weeks ago she started feeling as though she had a urinary tract infection and was seen by her primary care doctor.  UA was negative for infection but incidental finding of microscopic hematuria.  She says since that time she has continued to have painful bladder spasms, sensation of incomplete  voiding, urgency, and feels as though she has a UTI.  She does not see blood in her urine and does not pass clots.  She has tried drinking cranberry juice and taking over-the-counter medications without improvement in her symptoms.  Nothing seems to make symptoms better or worse.  She drinks coffee in the morning but otherwise only drinks water.  Continues to smoke though she continues to cut back. 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  She denies neurologic complaints, fevers or illness, easy bleeding or bruising.  Reports good appetite and denies weight loss.  Denies chest pain, nausea, vomiting, constipation, or diarrhea.  She has chronic pelvic and back pain which is stable and unchanged.  She has not seen pelvic floor physical therapy currently due to patient preference.  She is followed by outpatient palliative care for chronic pain.  ECOG FS:1 - Symptomatic but completely ambulatory  Review of systems- Review of Systems  Reason unable to perform ROS: No vaginal bleeding or discharge.  Constitutional: Negative for chills and fever.  Respiratory: Negative for cough and shortness of breath.   Cardiovascular: Negative for chest pain and palpitations.  Gastrointestinal: Positive for abdominal pain (Chronic-managed by palliative care). Negative for constipation, diarrhea and nausea.  Genitourinary: Positive for dysuria, frequency, hematuria and urgency. Negative for flank pain.  Musculoskeletal: Positive for back pain. Negative for falls and joint pain.  Neurological: Negative for dizziness, weakness and headaches.  Endo/Heme/Allergies: Negative for polydipsia. Does not bruise/bleed easily.  Psychiatric/Behavioral: Negative for depression. The patient is not nervous/anxious.     Current treatment-surveillance  Allergies  Allergen Reactions  . Amoxicillin Rash  . Penicillins Hives    Past Medical History:  Diagnosis Date  . Anxiety   . Hashimoto's thyroiditis   .  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 History of cervical cancer   . Hypothyroid   . Low back pain     Past Surgical History:  Procedure Laterality Date  . BUNIONECTOMY Right   . PORTA CATH INSERTION N/A 03/31/2018   Procedure: PORTA CATH INSERTION;  Surgeon: Algernon Huxley, MD;  Location: [facility];  Service: Cardiovascular;  Laterality: N/A;  . TONSILLECTOMY      Social History   Socioeconomic History  . Marital status: Married    Spouse name: Not on file  . Number of children: Not on file  . Years of education: Not on file  . Highest education level: Not on file  Occupational History  . Not on file  Social Needs  . Financial resource strain: Not on file  . Food insecurity    Worry: Not on file    Inability: Not on file  . Transportation needs    Medical: Not on file    Non-medical: Not on file  Tobacco Use  . Smoking status: Current Some Day Smoker    Packs/day: 0.15    Years: 10.00    Pack years: 1.50    Types: Cigarettes  . Smokeless tobacco: Never Used  . Tobacco comment: 2 weeks to use a pack  Substance and Sexual Activity  . Alcohol use: Yes    Comment: rare  . Drug use: Yes    Types: Marijuana    Comment: quit opiates 5 years ago  . Sexual activity: Yes  Birth control/protection: None  Lifestyle  . Physical activity    Days per week: Not on file    Minutes per session: Not on file  . Stress: Not on file  Relationships  . Social Herbalist on phone: Not on file    Gets together: Not on file    Attends religious service: Not on file    Active member of club or organization: Not on file    Attends meetings of clubs or organizations: Not on file    Relationship status: Not on file  . Intimate partner violence    Fear of current or ex partner: Not on file    Emotionally abused: Not on file    Physically abused: Not on file    Forced sexual activity: Not on file  Other Topics Concern  . Not on file  Social History  Narrative  . Not on file    Family History  Adopted: Yes  Family history unknown: Yes     Current Outpatient Medications:  .  acetaminophen (TYLENOL) 500 MG tablet, Take 500 mg by mouth every 4 (four) hours as needed., Disp: , Rfl:  .  ALPRAZolam (XANAX) 1 MG tablet, TAKE 1 TABLET BY MOUTH THREE TIMES DAILY AS NEEDED, Disp: 90 tablet, Rfl: 4 .  DULoxetine (CYMBALTA) 30 MG capsule, Take one capsule by mouth daily x 1 week and then 2 capsules daily thereafter., Disp: 60 capsule, Rfl: 3 .  estradiol (VIVELLE-DOT) 0.1 MG/24HR patch, Place 1 patch (0.1 mg total) onto the skin 2 (two) times a week., Disp: 8 patch, Rfl: 12 .  gabapentin (NEURONTIN) 300 MG capsule, TAKE 1 CAPSULE BY MOUTH THREE TIMES DAILY, Disp: 90 capsule, Rfl: 0 .  levothyroxine (SYNTHROID) 175 MCG tablet, Take 1 tablet (175 mcg total) by mouth daily before breakfast., Disp: 90 tablet, Rfl: 1 .  lidocaine-prilocaine (EMLA) cream, Apply to affected area once, Disp: 30 g, Rfl: 3 .  progesterone (PROMETRIUM) 200 MG capsule, Take 200 mg by mouth at bedtime with food for 12 days sequentially per 28 day cycle., Disp: 12 capsule, Rfl: 11 .  promethazine (PHENERGAN) 25 MG tablet, TAKE 1 2 TO 1 (ONE HALF TO ONE) TABLET BY MOUTH EVERY 12 HOURS AS NEEDED FOR NAUSEA, Disp: , Rfl:  .  ondansetron (ZOFRAN) 8 MG tablet, Take 8 mg by mouth every 8 (eight) hours as needed for nausea or vomiting., Disp: , Rfl:  No current facility-administered medications for this visit.   Facility-Administered Medications Ordered in Other Visits:  .  sodium chloride flush (NS) 0.9 % injection 10 mL, 10 mL, Intravenous, PRN, Sindy Guadeloupe, MD, 10 mL at 04/15/18 0825  Physical exam:  Vitals:   02/27/19 1135  BP: 119/84  Pulse: (!) 112  Resp: 18  Temp: 98.6 F (37 C)  TempSrc: Tympanic  Weight: (!) 309 lb (140.2 kg)   Physical Exam Constitutional:      General: She is not in acute distress.    Appearance: She is obese.     Comments: Unaccompanied.   Wearing mask.  HENT:     Head: Normocephalic.  Eyes:     General: No scleral icterus.    Conjunctiva/sclera: Conjunctivae normal.  Neck:     Musculoskeletal: Neck supple.  Abdominal:     General: There is no distension.     Tenderness: There is no abdominal tenderness. There is no guarding.  Musculoskeletal:     Right lower leg: No edema.  Left lower leg: No edema.  Skin:    General: Skin is warm and dry.  Neurological:     Mental Status: She is alert and oriented to person, place, and time.     Gait: Gait normal.  Psychiatric:        Mood and Affect: Mood normal.        Behavior: Behavior normal.      CMP Latest Ref Rng & Units 11/14/2018  Glucose 70 - 99 mg/dL 103(H)  BUN 6 - 20 mg/dL 10  Creatinine 0.44 - 1.00 mg/dL 0.74  Sodium 135 - 145 mmol/L 136  Potassium 3.5 - 5.1 mmol/L 3.9  Chloride 98 - 111 mmol/L 103  CO2 22 - 32 mmol/L 25  Calcium 8.9 - 10.3 mg/dL 9.2  Total Protein 6.5 - 8.1 g/dL 7.9  Total Bilirubin 0.3 - 1.2 mg/dL 0.3  Alkaline Phos 38 - 126 U/L 79  AST 15 - 41 U/L 30  ALT 0 - 44 U/L 36   CBC Latest Ref Rng & Units 11/14/2018  WBC 4.0 - 10.5 K/uL 5.3  Hemoglobin 12.0 - 15.0 g/dL 12.8  Hematocrit 36.0 - 46.0 % 36.5  Platelets 150 - 400 K/uL 367    No images are attached to the encounter.  Nm Pet Image Restag (ps) Skull Base To Thigh  Result Date: 02/20/2019 CLINICAL DATA:  Subsequent treatment strategy for cervical carcinoma. EXAM: NUCLEAR MEDICINE PET SKULL BASE TO THIGH TECHNIQUE: 16.6 mCi F-18 FDG was injected intravenously. Full-ring PET imaging was performed from the skull base to thigh after the radiotracer. CT data was obtained and used for attenuation correction and anatomic localization. Fasting blood glucose: 104 mg/dl COMPARISON:  CT 01/22/2019, PET-CT 03/28/2018 FINDINGS: Mediastinal blood pool activity: SUV max 2.1 Liver activity: SUV max NA NECK: No hypermetabolic lymph nodes in the neck. Diffuse activity within the thyroid gland is  consistent thyroiditis. Incidental CT findings: Port in the anterior chest wall with tip in distal SVC. CHEST: No hypermetabolic mediastinal or hilar nodes. Incidental CT findings: Small subpleural nodule in the LEFT lower lobe measuring 3 mm (image 118/3). Similar 4 mm nodule in the RIGHT lower lobe on image 120/3. These small nodules are not seen on most recent comparison CT (11/22/2018). ABDOMEN/PELVIS: No residual hypermetabolic cervical activity. No hypermetabolic pelvic lymph nodes. No enlarged lymph nodes on the CT portion exam. No hypermetabolic periaortic lymph nodes. No enlarged retroperitoneal lymph nodes. No abnormal activity liver.  Scattered physiologic activity bowel. Incidental CT findings: none SKELETON: No focal hypermetabolic activity to suggest skeletal metastasis. Incidental CT findings: none IMPRESSION: 1. Two small subpleural pulmonary nodules are not seen on prior. Recommend close attention on follow-up. 2. No hypermetabolic uterine cervical tissue to suggest local recurrence. 3. No hypermetabolic pelvic or retroperitoneal lymph nodes to suggest metastatic disease recurrence. 4. Diffuse hypermetabolic thyroid gland activity consistent with thyroiditis. Electronically Signed   By: Suzy Bouchard M.D.   On: 02/20/2019 16:24    Assessment and plan- Patient is a 33 y.o. female with history of cervical cancer who presents to symptom management clinic for painful bladder spasms and microscopic hematuria.  1.  Microscopic hematuria-etiology unclear.  UA repeated today and positive for RBCs; negative for infection. Radiation-induced cystitis unlikely given onset of symptoms. Will refer to urology for cystoscopy and further evaluation and management.  2.  Bladder spasms-irritative. Short trial of pyridium. If symptoms refractory, start oxybutynin xl 5mg  daily.   3. Pelvic floor dysfunction- encouraged patient to follow back up with  pelvic floor PT for continuation of care.    Disposition:   Referral to Urology Follow up in Symptom Management Clinic if symptoms don't improve or worsen  Visit Diagnosis 1. Bladder spasms   2. Microscopic hematuria     Patient expressed understanding and was in agreement with this plan. She also understands that She can call clinic at any time with any questions, concerns, or complaints.   Thank you for allowing me to participate in the care of this very pleasant patient.   Beckey Rutter, DNP, AGNP-C [place] at [facility] [phone] (clinic)  CC: Dr. Janese Banks

## 2019-03-01 ENCOUNTER — Other Ambulatory Visit: Payer: Self-pay | Admitting: *Deleted

## 2019-03-01 ENCOUNTER — Ambulatory Visit
Admission: RE | Admit: 2019-03-01 | Discharge: 2019-03-01 | Disposition: A | Discharge status: Home / Self Care | Payer: Medicaid Other | Source: Ambulatory Visit | Attending: Radiation Oncology | Admitting: Radiation Oncology

## 2019-03-01 ENCOUNTER — Encounter: Payer: Self-pay | Admitting: Radiation Oncology

## 2019-03-01 ENCOUNTER — Other Ambulatory Visit: Payer: Self-pay

## 2019-03-01 VITALS — BP 131/88 | HR 105 | Temp 97.9°F | Wt 305.7 lb

## 2019-03-01 DIAGNOSIS — Z8542 Personal history of malignant neoplasm of other parts of uterus: Secondary | ICD-10-CM | POA: Insufficient documentation

## 2019-03-01 DIAGNOSIS — N3040 Irradiation cystitis without hematuria: Secondary | ICD-10-CM | POA: Diagnosis not present

## 2019-03-01 DIAGNOSIS — Z923 Personal history of irradiation: Secondary | ICD-10-CM | POA: Insufficient documentation

## 2019-03-01 DIAGNOSIS — C539 Malignant neoplasm of cervix uteri, unspecified: Secondary | ICD-10-CM

## 2019-03-01 MED ORDER — MIRABEGRON ER 25 MG PO TB24: 25.0000 mg | ORAL_TABLET | Freq: Every day | ORAL | 5 refills | Status: DC

## 2019-03-01 NOTE — Progress Notes (Signed)
Radiation Oncology Follow up Note  Name: Kristy Gordon   Date:   03/01/2019 MRN:  [id number] DOB: 1985-04-15    This 33 y.o. 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  REFERRING PROVIDER: [name],*  HPI: Patient is a 33 year old female now out 8 months having completed both chemotherapy with cis-platinum and radiation as well as vaginal brachytherapy which was completed in March 2020 for stage IIIc adenocarcinoma of the cervix.  She has been having some increased lower urinary tract symptoms such as failure to fully void urgency frequency and some bladder pain.  She also has trace RBCs in her urinalysis.  Urine culture was negative.  She recently had a PET CT scan showing no evidence of disease..  She is seen symptom management for pelvic pain which has persisted for several months and has been sent for pain management for consideration of plexus nerve block.  Patient is also on gabapentin 300 mg 3 times a day.  Her symptoms no gross hematuria and as described above.  COMPLICATIONS OF TREATMENT: none  FOLLOW UP COMPLIANCE: keeps appointments   PHYSICAL EXAM:  BP 131/88 (BP Location: Left Arm, Patient Position: Sitting)   Pulse (!) 105   Temp 97.9 F (36.6 C) (Tympanic)   Wt (!) 305 lb 11.2 oz (138.7 kg)   BMI 47.88 kg/m  Well-developed well-nourished patient in NAD. HEENT reveals PERLA, EOMI, discs not visualized.  Oral cavity is clear. No oral mucosal lesions are identified. Neck is clear without evidence of cervical or supraclavicular adenopathy. Lungs are clear to A&P. Cardiac examination is essentially unremarkable with regular rate and rhythm without murmur rub or thrill. Abdomen is benign with no organomegaly or masses noted. Motor sensory and DTR levels  are equal and symmetric in the upper and lower extremities. Cranial nerves II through XII are grossly intact. Proprioception is intact. No peripheral adenopathy or edema is identified. No motor or sensory levels are noted. Crude visual fields are within normal range.  RADIOLOGY RESULTS: PET CT scan reviewed compatible with above-stated findings  PLAN: Patient has an appointment with urology for possible cystoscopy in early January.  I am starting the patient on Myrbetriq for what seems to be bladder spasms.  She also will continue her follow-up with symptom management as well as urology.  I have asked to see her back in 2 weeks.  She is also using nonsteroidals for her pelvic pain.  I am pleased there is no evidence of disease on PET CT scan.  Patient knows to call sooner with any concerns.  I would like to take this opportunity to thank you for allowing me to participate in the care of your patient.Noreene Filbert, MD

## 2019-03-06 ENCOUNTER — Inpatient Hospital Stay (HOSPITAL_BASED_OUTPATIENT_CLINIC_OR_DEPARTMENT_OTHER): Payer: Medicaid Other | Admitting: Hospice and Palliative Medicine

## 2019-03-06 DIAGNOSIS — C539 Malignant neoplasm of cervix uteri, unspecified: Secondary | ICD-10-CM

## 2019-03-06 NOTE — Progress Notes (Signed)
I had a scheduled telephone visit with patient today. I tried calling her several times without success. Message left.

## 2019-03-07 ENCOUNTER — Encounter: Payer: Self-pay | Admitting: Radiation Oncology

## 2019-03-07 ENCOUNTER — Other Ambulatory Visit: Payer: Self-pay | Admitting: *Deleted

## 2019-03-07 MED ORDER — MIRABEGRON ER 25 MG PO TB24: 25.0000 mg | ORAL_TABLET | Freq: Every day | ORAL | 5 refills | Status: DC

## 2019-03-08 ENCOUNTER — Other Ambulatory Visit: Payer: Self-pay | Admitting: *Deleted

## 2019-03-08 MED ORDER — DULOXETINE HCL 30 MG PO CPEP: ORAL_CAPSULE | ORAL | 3 refills | Status: DC

## 2019-03-09 ENCOUNTER — Other Ambulatory Visit: Payer: Self-pay | Admitting: *Deleted

## 2019-03-09 MED ORDER — OXYBUTYNIN CHLORIDE ER 10 MG PO TB24: 10.0000 mg | ORAL_TABLET | Freq: Every day | ORAL | 3 refills | Status: DC

## 2019-03-13 ENCOUNTER — Other Ambulatory Visit: Payer: Self-pay | Admitting: Oncology

## 2019-03-20 ENCOUNTER — Encounter: Payer: Self-pay | Admitting: Nurse Practitioner

## 2019-03-20 ENCOUNTER — Other Ambulatory Visit: Payer: Self-pay

## 2019-03-20 ENCOUNTER — Ambulatory Visit
Admission: RE | Admit: 2019-03-20 | Discharge: 2019-03-20 | Disposition: A | Discharge status: Home / Self Care | Payer: Medicaid Other | Source: Ambulatory Visit | Attending: Radiation Oncology | Admitting: Radiation Oncology

## 2019-03-20 ENCOUNTER — Inpatient Hospital Stay: Payer: Medicaid Other | Attending: Nurse Practitioner | Admitting: Nurse Practitioner

## 2019-03-20 VITALS — BP 115/83 | HR 111 | Temp 99.2°F | Resp 16 | Wt 306.1 lb

## 2019-03-20 VITALS — BP 111/83 | HR 111 | Temp 99.2°F | Resp 16

## 2019-03-20 DIAGNOSIS — Z8542 Personal history of malignant neoplasm of other parts of uterus: Secondary | ICD-10-CM | POA: Diagnosis not present

## 2019-03-20 DIAGNOSIS — N3289 Other specified disorders of bladder: Secondary | ICD-10-CM | POA: Diagnosis not present

## 2019-03-20 DIAGNOSIS — M6289 Other specified disorders of muscle: Secondary | ICD-10-CM | POA: Diagnosis not present

## 2019-03-20 DIAGNOSIS — C539 Malignant neoplasm of cervix uteri, unspecified: Secondary | ICD-10-CM | POA: Insufficient documentation

## 2019-03-20 DIAGNOSIS — Z9221 Personal history of antineoplastic chemotherapy: Secondary | ICD-10-CM | POA: Insufficient documentation

## 2019-03-20 DIAGNOSIS — N3040 Irradiation cystitis without hematuria: Secondary | ICD-10-CM | POA: Insufficient documentation

## 2019-03-20 DIAGNOSIS — Z923 Personal history of irradiation: Secondary | ICD-10-CM | POA: Diagnosis not present

## 2019-03-20 DIAGNOSIS — F17210 Nicotine dependence, cigarettes, uncomplicated: Secondary | ICD-10-CM | POA: Insufficient documentation

## 2019-03-20 DIAGNOSIS — N8184 Pelvic muscle wasting: Secondary | ICD-10-CM | POA: Insufficient documentation

## 2019-03-20 DIAGNOSIS — G8929 Other chronic pain: Secondary | ICD-10-CM | POA: Insufficient documentation

## 2019-03-20 DIAGNOSIS — R3129 Other microscopic hematuria: Secondary | ICD-10-CM | POA: Diagnosis not present

## 2019-03-20 DIAGNOSIS — N3090 Cystitis, unspecified without hematuria: Secondary | ICD-10-CM | POA: Diagnosis not present

## 2019-03-20 NOTE — Progress Notes (Signed)
Radiation Oncology Follow up Note  Name: Kristy Gordon   Date:   03/20/2019 MRN:  [id number] DOB: 07-Dec-1985    This 33 y.o. 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  REFERRING PROVIDER: [name],*  HPI: Patient is seen for 2-week follow-up..  Status post complaining of symptoms suggestive of radiation cystitis.  She has been put on Myrbetriq  which seems to have improved her urinary frequency.  She still has some urgency.  Her pelvic pain has improved.  She is also has appointment in January with urology.  She specifically denies hematuria.  COMPLICATIONS OF TREATMENT: none  FOLLOW UP COMPLIANCE: keeps appointments   PHYSICAL EXAM:  BP 115/83   Pulse (!) 111   Temp 99.2 F (37.3 C)   Resp 16   Wt (!) 306 lb 1.6 oz (138.8 kg)   SpO2 98%   BMI 47.94 kg/m  Well-developed well-nourished patient in NAD. HEENT reveals PERLA, EOMI, discs not visualized.  Oral cavity is clear. No oral mucosal lesions are identified. Neck is clear without evidence of cervical or supraclavicular adenopathy. Lungs are clear to A&P. Cardiac examination is essentially unremarkable with regular rate and rhythm without murmur rub or thrill. Abdomen is benign with no organomegaly or masses noted. Motor sensory and DTR levels are equal and symmetric in the upper and lower extremities. Cranial nerves II through XII are grossly intact. Proprioception is intact. No peripheral adenopathy or edema is identified. No motor or sensory levels are noted. Crude visual fields are within normal range.  RADIOLOGY RESULTS: PET/CT CT scan from month ago reviewed showing no evidence to suggest recurrent disease in her pelvis  PLAN: Present time patient is improving from urologic standpoint.  She will continue and see  urology for evaluation next month.  I have asked to see her back in 4 to 6 months for follow-up.  Patient knows to call with any concerns.  I would like to take this opportunity to thank you for allowing me to participate in the care of your patient.Noreene Filbert, MD

## 2019-03-20 NOTE — Progress Notes (Signed)
Symptom Management [place]  [phone] [phone]  Patient Care Team: [name], MD as PCP - General (Family Medicine) Mellody Drown, MD as Referring Physician (Obstetrics) Sindy Guadeloupe, MD as Consulting Physician (Oncology) Noreene Filbert, MD as Referring Physician (Radiation Oncology) Clent Jacks, RN as Registered Nurse Lucky Cowboy Erskine Squibb, MD as Referring Physician (Vascular Surgery)   Name of the patient: Kristy Lat  [id number]  03-22-1986   Date of visit: 03/20/19  Diagnosis-cervical cancer  Chief complaint/ Reason for visit-painful bladder spasms and hematuria  Heme/Onc history:  Oncology History Overview Note  Patient presented as a 33 year old, G0, to ER on 03/15/2018 with symptoms of abdominal pain and cramping.  She has also been having irregular menstrual bleeding and spotting on and off for the last few months.She had an ultrasound pelvis done in the ER which showed a large 5.8 cm hypoechoic vascular mass within the cervix and the lower uterine segment.  Patient had last seen GYN in 2013 and had not had a Pap smear done since then.  Patient was seen by GYN Dr. Nechama Guard on 1213 and was found to have firm friable cervix and multiple biopsies were taken which showed adenocarcinoma.  She was then seen by Dr. Fransisca Connors from GYN oncology. Pelvic exam showed anteverted cervix that was replaced by tumor and extension of cancer into the left parametrium and possibly to the pelvic sidewall.    PET CT scan on 03/28/2018 showed 6 cm hypermetabolic cervical mass consistent with primary cervical carcinoma.  Mild hypermetabolic bilateral parametrial, right perirectal,  bilateral iliac lymph nodes,  consistent with metastatic disease.  No evidence of metastatic disease within the abdomen, chest, or neck.   Cycle 1 of weekly cisplatin and radiation started on 04/11/2018. She completed chemo-radiation on 05/19/2018 followed by  vaginal brachytherapy at [facility]  PET for restaging on 08/31/2018 showed clear interval response to therapy, cervix is decreased substantially in size with no substantial hypermetabolism today, interval resolution of the hypermetabolic pelvic lymphadenopathy seen previously.   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[place])   04/01/2018 Cancer Staging   Staging form: Cervix Uteri, AJCC 8th Edition - Clinical stage from 04/01/2018: FIGO Stage III (cT3, cN1, cM0) - Signed by Sindy Guadeloupe, MD on 04/01/2018   04/11/2018 -  Chemotherapy   The patient had palonosetron (ALOXI) injection 0.25 mg, 0.25 mg, Intravenous,  Once, 6 of 6 cycles Administration: 0.25 mg (04/11/2018), 0.25 mg (04/19/2018), 0.25 mg (04/27/2018), 0.25 mg (05/04/2018), 0.25 mg (05/12/2018), 0.25 mg (05/19/2018) CISplatin (PLATINOL) 90 mg in sodium chloride 0.9 % 250 mL chemo infusion, 40 mg/m2 = 90 mg, Intravenous,  Once, 6 of 6 cycles Administration: 90 mg (04/11/2018), 90 mg (04/27/2018), 90 mg (04/20/2018), 90 mg (05/04/2018), 90 mg (05/12/2018), 90 mg (05/19/2018) fosaprepitant (EMEND) 150 mg, dexamethasone (DECADRON) 12 mg in sodium chloride 0.9 % 145 mL IVPB, , Intravenous,  Once, 6 of 6 cycles Administration:  (04/11/2018),  (04/27/2018),  (04/20/2018),  (05/04/2018),  (05/12/2018),  (05/19/2018)  for chemotherapy treatment.      Interval history- Kristy Gordon, 33 year old female with above history of cervical cancer status post chemo and radiation with brachytherapy, completed March 2020, presents to symptom management clinic for reevaluation of bladder spasms and hematuria.  In the interim, she was seen by Dr. Donella Stade for evaluation of symptoms thought to be secondary to mild radiation cystitis.  She continues to have some urinary  frequency and bladder pain/spasms.  No gross hematuria.  Trace RBCs in her urinalysis.  Cultures have been negative.  Previous PET scans showed no evidence of disease.  She  was previously sent to pain management for consideration of plexus nerve block for chronic pelvic pain.  She was unable to tolerate pelvic floor PT due to worsening of pain.  She has an appointment with urology for possible cystoscopy in January.  She was started on Myrbetriq for bladder spasms.  She says she has not noticed a significant difference in her symptoms since starting Myrbetriq.  Continues to have painful bladder spasms, sensation of incomplete voiding, and urinary frequency.  No new burning or pain.  Symptoms are unchanged.  Hasn't seen pelvic floor PT for pain management recently.  Continues to be followed by palliative care for chronic pain.  ECOG FS:1 - Symptomatic but completely ambulatory  Review of systems- Review of Systems  Reason unable to perform ROS: No vaginal bleeding or discharge.  Constitutional: Negative for chills and fever.  Respiratory: Negative for cough and shortness of breath.   Cardiovascular: Negative for chest pain and palpitations.  Gastrointestinal: Positive for abdominal pain (Chronic-managed by palliative care). Negative for constipation, diarrhea and nausea.  Genitourinary: Positive for dysuria, frequency, hematuria and urgency. Negative for flank pain.  Musculoskeletal: Positive for back pain. Negative for falls and joint pain.  Neurological: Negative for dizziness, weakness and headaches.  Endo/Heme/Allergies: Negative for polydipsia. Does not bruise/bleed easily.  Psychiatric/Behavioral: Negative for depression. The patient is not nervous/anxious.     Current treatment-surveillance  Allergies  Allergen Reactions  . Amoxicillin Rash  . Penicillins Hives    Past Medical History:  Diagnosis Date  . Anxiety   . Hashimoto's thyroiditis   . 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 History of cervical cancer   . Hypothyroid   . Low back pain     Past Surgical History:  Procedure Laterality Date  . BUNIONECTOMY Right   . PORTA CATH  INSERTION N/A 03/31/2018   Procedure: PORTA CATH INSERTION;  Surgeon: Algernon Huxley, MD;  Location: [facility];  Service: Cardiovascular;  Laterality: N/A;  . TONSILLECTOMY      Social History   Socioeconomic History  . Marital status: Married    Spouse name: Not on file  . Number of children: Not on file  . Years of education: Not on file  . Highest education level: Not on file  Occupational History  . Not on file  Tobacco Use  . Smoking status: Current Some Day Smoker    Packs/day: 0.15    Years: 10.00    Pack years: 1.50    Types: Cigarettes  . Smokeless tobacco: Never Used  . Tobacco comment: 2 weeks to use a pack  Substance and Sexual Activity  . Alcohol use: Yes    Comment: rare  . Drug use: Yes    Types: Marijuana    Comment: quit opiates 5 years ago  . Sexual activity: Yes    Birth control/protection: None  Other Topics Concern  . Not on file  Social History Narrative  . Not on file   Social Determinants of Health   Financial Resource Strain:   . Difficulty of Paying Living Expenses: Not on file  Food Insecurity:   . Worried About Charity fundraiser in the Last Year: Not on file  . Ran Out of Food in the Last Year: Not on file  Transportation Needs:   . Film/video editor (Medical): Not on file  . Lack of Transportation (Non-Medical): Not on file  Physical Activity:   . Days of Exercise per Week: Not on file  . Minutes of Exercise per Session: Not on file  Stress:   . Feeling of Stress : Not on file  Social Connections:   . Frequency of Communication with Friends and Family: Not on file  . Frequency of Social Gatherings with Friends and Family: Not on file  . Attends Religious Services: Not on file  . Active Member of Clubs or Organizations: Not on file  . Attends Archivist Meetings: Not on file  . Marital Status: Not on file  Intimate Partner Violence:   . Fear of Current or Ex-Partner: Not on file  . Emotionally Abused:  Not on file  . Physically Abused: Not on file  . Sexually Abused: Not on file    Family History  Adopted: Yes  Family history unknown: Yes     Current Outpatient Medications:  .  acetaminophen (TYLENOL) 500 MG tablet, Take 500 mg by mouth every 4 (four) hours as needed., Disp: , Rfl:  .  ALPRAZolam (XANAX) 1 MG tablet, TAKE 1 TABLET BY MOUTH THREE TIMES DAILY AS NEEDED, Disp: 90 tablet, Rfl: 4 .  DULoxetine (CYMBALTA) 30 MG capsule, 2 capsules daily, Disp: 60 capsule, Rfl: 3 .  estradiol (VIVELLE-DOT) 0.1 MG/24HR patch, Place 1 patch (0.1 mg total) onto the skin 2 (two) times a week., Disp: 8 patch, Rfl: 12 .  gabapentin (NEURONTIN) 300 MG capsule, TAKE 1 CAPSULE BY MOUTH THREE TIMES DAILY, Disp: 90 capsule, Rfl: 0 .  levothyroxine (SYNTHROID) 175 MCG tablet, Take 1 tablet (175 mcg total) by mouth daily before breakfast., Disp: 90 tablet, Rfl: 1 .  lidocaine-prilocaine (EMLA) cream, Apply to affected area once, Disp: 30 g, Rfl: 3 .  mirabegron ER (MYRBETRIQ) 25 MG TB24 tablet, Take 1 tablet (25 mg total) by mouth daily. (Patient not taking: Reported on 03/20/2019), Disp: 30 tablet, Rfl: 5 .  ondansetron (ZOFRAN) 8 MG tablet, Take 8 mg by mouth every 8 (eight) hours as needed for nausea or vomiting., Disp: , Rfl:  .  oxybutynin (DITROPAN XL) 10 MG 24 hr tablet, Take 1 tablet (10 mg total) by mouth at bedtime., Disp: 30 tablet, Rfl: 3 .  progesterone (PROMETRIUM) 200 MG capsule, Take 200 mg by mouth at bedtime with food for 12 days sequentially per 28 day cycle., Disp: 12 capsule, Rfl: 11 .  promethazine (PHENERGAN) 25 MG tablet, TAKE 1 2 TO 1 (ONE HALF TO ONE) TABLET BY MOUTH EVERY 12 HOURS AS NEEDED FOR NAUSEA, Disp: , Rfl:  No current facility-administered medications for this visit.  Facility-Administered Medications Ordered in Other Visits:  .  sodium chloride flush (NS) 0.9 % injection 10 mL, 10 mL, Intravenous, PRN, Sindy Guadeloupe, MD, 10 mL at 04/15/18 0825  Physical exam:    Vitals:   03/20/19 1044  BP: 111/83  Pulse: (!) 111  Resp: 16  Temp: 99.2 F (37.3 C)  TempSrc: Tympanic   Physical Exam Constitutional:      General: She is not in acute distress.    Appearance: She is obese.     Comments: Unaccompanied.  Wearing mask.  HENT:     Head: Normocephalic.  Eyes:     General: No scleral icterus.    Conjunctiva/sclera: Conjunctivae normal.  Abdominal:     General: There is no distension.  Tenderness: There is no abdominal tenderness. There is no guarding.  Musculoskeletal:     Cervical back: Neck supple.     Right lower leg: No edema.     Left lower leg: No edema.  Skin:    General: Skin is warm and dry.  Neurological:     Mental Status: She is alert and oriented to person, place, and time.     Gait: Gait normal.  Psychiatric:        Mood and Affect: Mood normal.        Behavior: Behavior normal.      CMP Latest Ref Rng & Units 11/14/2018  Glucose 70 - 99 mg/dL 103(H)  BUN 6 - 20 mg/dL 10  Creatinine 0.44 - 1.00 mg/dL 0.74  Sodium 135 - 145 mmol/L 136  Potassium 3.5 - 5.1 mmol/L 3.9  Chloride 98 - 111 mmol/L 103  CO2 22 - 32 mmol/L 25  Calcium 8.9 - 10.3 mg/dL 9.2  Total Protein 6.5 - 8.1 g/dL 7.9  Total Bilirubin 0.3 - 1.2 mg/dL 0.3  Alkaline Phos 38 - 126 U/L 79  AST 15 - 41 U/L 30  ALT 0 - 44 U/L 36   CBC Latest Ref Rng & Units 11/14/2018  WBC 4.0 - 10.5 K/uL 5.3  Hemoglobin 12.0 - 15.0 g/dL 12.8  Hematocrit 36.0 - 46.0 % 36.5  Platelets 150 - 400 K/uL 367    No images are attached to the encounter.  NM PET Image Restag (PS) Skull Base To Thigh  Result Date: 02/20/2019 CLINICAL DATA:  Subsequent treatment strategy for cervical carcinoma. EXAM: NUCLEAR MEDICINE PET SKULL BASE TO THIGH TECHNIQUE: 16.6 mCi F-18 FDG was injected intravenously. Full-ring PET imaging was performed from the skull base to thigh after the radiotracer. CT data was obtained and used for attenuation correction and anatomic localization. Fasting  blood glucose: 104 mg/dl COMPARISON:  CT 01/22/2019, PET-CT 03/28/2018 FINDINGS: Mediastinal blood pool activity: SUV max 2.1 Liver activity: SUV max NA NECK: No hypermetabolic lymph nodes in the neck. Diffuse activity within the thyroid gland is consistent thyroiditis. Incidental CT findings: Port in the anterior chest wall with tip in distal SVC. CHEST: No hypermetabolic mediastinal or hilar nodes. Incidental CT findings: Small subpleural nodule in the LEFT lower lobe measuring 3 mm (image 118/3). Similar 4 mm nodule in the RIGHT lower lobe on image 120/3. These small nodules are not seen on most recent comparison CT (11/22/2018). ABDOMEN/PELVIS: No residual hypermetabolic cervical activity. No hypermetabolic pelvic lymph nodes. No enlarged lymph nodes on the CT portion exam. No hypermetabolic periaortic lymph nodes. No enlarged retroperitoneal lymph nodes. No abnormal activity liver.  Scattered physiologic activity bowel. Incidental CT findings: none SKELETON: No focal hypermetabolic activity to suggest skeletal metastasis. Incidental CT findings: none IMPRESSION: 1. Two small subpleural pulmonary nodules are not seen on prior. Recommend close attention on follow-up. 2. No hypermetabolic uterine cervical tissue to suggest local recurrence. 3. No hypermetabolic pelvic or retroperitoneal lymph nodes to suggest metastatic disease recurrence. 4. Diffuse hypermetabolic thyroid gland activity consistent with thyroiditis. Electronically Signed   By: Suzy Bouchard M.D.   On: 02/20/2019 16:24    Assessment and plan- Patient is a 32 y.o. female with history of cervical cancer who presents to symptom management clinic for painful bladder spasms and microscopic hematuria.  1.  Microscopic hematuria-etiology unclear. No evidence of adenopathy or recurrent disease on previous imaging. No evidence of infection. Recommend cystoscopy. Referral sent.    2.  Bladder spasms-etiology unclear-  irritative? secondary to  myofascial pelvic pain syndrome? refractory to Pyridium and Myrbetriq. Awaiting urology evaluation.   3. 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 Encouraged patient to follow back up with pelvic floor PT for continuation of care.   4.  Chronic pain-follow-up with palliative care as scheduled.   Disposition:  Follow-up with urology for cystoscopy Continue follow-up with palliative care and medical oncology Return to symptom management clinic if symptoms do not improve or worsen in the interim.  Visit Diagnosis 1. Microscopic hematuria   2. Pelvic floor dysfunction   3. Bladder spasm     Patient expressed understanding and was in agreement with this plan. She also understands that She can call clinic at any time with any questions, concerns, or complaints.   Thank you for allowing me to participate in the care of this very pleasant patient.   Beckey Rutter, DNP, AGNP-[facility] [place] at [facility] [phone] (clinic)

## 2019-03-22 ENCOUNTER — Ambulatory Visit: Payer: Self-pay | Admitting: Family Medicine

## 2019-04-03 NOTE — Progress Notes (Signed)
Patient: Kristy Gordon Female    DOB: [birth year]   33 y.o.   MRN: [id number] Visit Date: 04/04/2019  Today's Provider: Wilhemena Durie, MD   Chief Complaint  Patient presents with  . Follow-up  . Medication Refill   Subjective:     HPI Overall patient is doing well with metastatic cervical cancer.  She just had a PET scan which revealed subpleural pulmonary nodules and apparent thyroiditis. She is trying to get her GED and go back to school and is having trouble focusing on her schoolwork.  What she describes as chronic ADD.  Without hyper activity She has gained a lot of weight during treatment for cervical cancer and hopes to lose some weight this year. Allergies  Allergen Reactions  . Amoxicillin Rash  . Penicillins Hives     Current Outpatient Medications:  .  acetaminophen (TYLENOL) 500 MG tablet, Take 500 mg by mouth every 4 (four) hours as needed., Disp: , Rfl:  .  ALPRAZolam (XANAX) 1 MG tablet, TAKE 1 TABLET BY MOUTH THREE TIMES DAILY AS NEEDED, Disp: 90 tablet, Rfl: 4 .  DULoxetine (CYMBALTA) 30 MG capsule, 2 capsules daily, Disp: 60 capsule, Rfl: 3 .  estradiol (VIVELLE-DOT) 0.1 MG/24HR patch, Place 1 patch (0.1 mg total) onto the skin 2 (two) times a week., Disp: 8 patch, Rfl: 12 .  gabapentin (NEURONTIN) 300 MG capsule, TAKE 1 CAPSULE BY MOUTH THREE TIMES DAILY, Disp: 90 capsule, Rfl: 0 .  levothyroxine (SYNTHROID) 175 MCG tablet, Take 1 tablet (175 mcg total) by mouth daily before breakfast., Disp: 90 tablet, Rfl: 1 .  lidocaine-prilocaine (EMLA) cream, Apply to affected area once, Disp: 30 g, Rfl: 3 .  ondansetron (ZOFRAN) 8 MG tablet, Take 8 mg by mouth every 8 (eight) hours as needed for nausea or vomiting., Disp: , Rfl:  .  oxybutynin (DITROPAN XL) 10 MG 24 hr tablet, Take 1 tablet (10 mg total) by mouth at bedtime., Disp: 30 tablet, Rfl: 3 .  progesterone (PROMETRIUM) 200 MG capsule, Take 200 mg by mouth at bedtime with food for 12 days sequentially  per 28 day cycle., Disp: 12 capsule, Rfl: 11 .  promethazine (PHENERGAN) 25 MG tablet, TAKE 1 2 TO 1 (ONE HALF TO ONE) TABLET BY MOUTH EVERY 12 HOURS AS NEEDED FOR NAUSEA, Disp: , Rfl:  No current facility-administered medications for this visit.  Facility-Administered Medications Ordered in Other Visits:  .  sodium chloride flush (NS) 0.9 % injection 10 mL, 10 mL, Intravenous, PRN, Sindy Guadeloupe, MD, 10 mL at 04/15/18 0825  Review of Systems  Constitutional: Negative for appetite change, chills, fatigue and fever.  HENT: Negative.   Eyes: Negative.   Respiratory: Negative for chest tightness and shortness of breath.   Cardiovascular: Negative for chest pain and palpitations.  Gastrointestinal: Negative for abdominal pain, nausea and vomiting.  Endocrine: Negative.   Genitourinary: Negative.   Musculoskeletal: Negative.   Skin: Negative.   Allergic/Immunologic: Negative.   Neurological: Negative for dizziness and weakness.  Hematological: Negative.   Psychiatric/Behavioral: Positive for decreased concentration. The patient is hyperactive.     Social History   Tobacco Use  . Smoking status: Current Some Day Smoker    Packs/day: 0.15    Years: 10.00    Pack years: 1.50    Types: Cigarettes  . Smokeless tobacco: Never Used  . Tobacco comment: 2 weeks to use a pack  Substance Use Topics  . Alcohol use: Yes  Comment: rare      Objective:    Vitals:   04/04/19 1330  BP: 118/80  Pulse: 79  Resp: 18  Temp: (!) 96.9 F (36.1 C)  TempSrc: Temporal  SpO2: 98%  Weight: (!) 308 lb (139.7 kg)  Height: 5\' 7"  (1.702 m)  Body mass index is 48.24 kg/m.   Physical Exam Vitals reviewed.  Constitutional:      Appearance: She is obese.  HENT:     Right Ear: External ear normal.     Left Ear: External ear normal.  Eyes:     General: No scleral icterus. Neck:     Comments: Goiter present.,stable. Cardiovascular:     Rate and Rhythm: Normal rate and regular rhythm.      Heart sounds: Normal heart sounds.  Pulmonary:     Effort: Pulmonary effort is normal.     Breath sounds: Normal breath sounds.  Abdominal:     Palpations: Abdomen is soft.  Skin:    General: Skin is warm and dry.  Neurological:     General: No focal deficit present.     Mental Status: She is alert and oriented to person, place, and time.  Psychiatric:        Mood and Affect: Mood normal.        Behavior: Behavior normal.        Thought Content: Thought content normal.        Judgment: Judgment normal.      No results found for any visits on 04/04/19.     Assessment & Plan      1. Attention deficit disorder, unspecified hyperactivity presence Try Adderall every morning before studies.  Return to clinic 1 month. - amphetamine-dextroamphetamine (ADDERALL) 10 MG tablet; Take 1 tablet (10 mg total) by mouth daily as needed.  Dispense: 60 tablet; Refill: 0  2. Thyroiditis Referred to endocrine clinic for consideration of treatment options. - TSH + free T4 - Ambulatory referral to Endocrinology  3. Malignant neoplasm of cervix, unspecified site [facility]) Per oncology  4. Class 3 severe obesity due to excess calories without serious comorbidity with body mass index (BMI) of 45.0 to 49.9 in adult [facility]) Gust diet and exercise with goal of losing 2 to 4 pounds per month in 2021     Wilhemena Durie, MD  [facility]

## 2019-04-04 ENCOUNTER — Encounter: Payer: Self-pay | Admitting: Family Medicine

## 2019-04-04 ENCOUNTER — Ambulatory Visit (INDEPENDENT_AMBULATORY_CARE_PROVIDER_SITE_OTHER): Payer: Medicaid Other | Admitting: Family Medicine

## 2019-04-04 ENCOUNTER — Other Ambulatory Visit: Payer: Self-pay

## 2019-04-04 VITALS — BP 118/80 | HR 79 | Temp 96.9°F | Resp 18 | Ht 67.0 in | Wt 308.0 lb

## 2019-04-04 DIAGNOSIS — F988 Other specified behavioral and emotional disorders with onset usually occurring in childhood and adolescence: Secondary | ICD-10-CM

## 2019-04-04 DIAGNOSIS — E069 Thyroiditis, unspecified: Secondary | ICD-10-CM | POA: Diagnosis not present

## 2019-04-04 DIAGNOSIS — Z6841 Body Mass Index (BMI) 40.0 and over, adult: Secondary | ICD-10-CM

## 2019-04-04 DIAGNOSIS — C539 Malignant neoplasm of cervix uteri, unspecified: Secondary | ICD-10-CM | POA: Diagnosis not present

## 2019-04-04 NOTE — Patient Instructions (Signed)
1. Attention deficit disorder, unspecified hyperactivity presence  - amphetamine-dextroamphetamine (ADDERALL) 10 MG tablet; Take 1 tablet (10 mg total) by mouth 2 (two) times daily.  Dispense: 60 tablet; Refill: 0  2. Thyroiditis  - TSH + free T4 - Ambulatory referral to Endocrinology

## 2019-04-07 DIAGNOSIS — N3040 Irradiation cystitis without hematuria: Secondary | ICD-10-CM | POA: Insufficient documentation

## 2019-04-07 MED ORDER — AMPHETAMINE-DEXTROAMPHETAMINE 10 MG PO TABS: 10.0000 mg | ORAL_TABLET | Freq: Every day | ORAL | 0 refills | Status: DC | PRN

## 2019-04-10 DIAGNOSIS — M6289 Other specified disorders of muscle: Secondary | ICD-10-CM | POA: Insufficient documentation

## 2019-04-12 ENCOUNTER — Other Ambulatory Visit: Payer: Self-pay | Admitting: Family Medicine

## 2019-04-13 ENCOUNTER — Encounter: Payer: Self-pay | Admitting: Pharmacy Technician

## 2019-04-13 NOTE — Progress Notes (Signed)
Patient no longer getting Feraheme from [facility] based on Medicaid coverage. Did not receive any doses.

## 2019-04-17 ENCOUNTER — Ambulatory Visit (INDEPENDENT_AMBULATORY_CARE_PROVIDER_SITE_OTHER): Payer: Medicaid Other | Admitting: Urology

## 2019-04-17 ENCOUNTER — Other Ambulatory Visit: Payer: Self-pay

## 2019-04-17 ENCOUNTER — Encounter: Payer: Self-pay | Admitting: Urology

## 2019-04-17 VITALS — BP 127/81 | HR 117 | Ht 67.0 in | Wt 290.0 lb

## 2019-04-17 DIAGNOSIS — R399 Unspecified symptoms and signs involving the genitourinary system: Secondary | ICD-10-CM | POA: Diagnosis not present

## 2019-04-17 DIAGNOSIS — R3129 Other microscopic hematuria: Secondary | ICD-10-CM | POA: Diagnosis not present

## 2019-04-17 LAB — URINALYSIS, COMPLETE
Bilirubin, UA: NEGATIVE
Glucose, UA: NEGATIVE
Leukocytes,UA: NEGATIVE
Nitrite, UA: NEGATIVE
Specific Gravity, UA: >1.03 — ABNORMAL HIGH (ref 1.005–1.030)
Urobilinogen, Ur: 0.2 mg/dL (ref 0.2–1.0)
pH, UA: 5.5 (ref 5.0–7.5)

## 2019-04-17 LAB — MICROSCOPIC EXAMINATION: RBC, Urine: NONE SEEN /hpf (ref 0–2)

## 2019-04-17 NOTE — Progress Notes (Signed)
04/17/2019 11:52 AM   Kristy Gordon June 25, 1985 [id number]  Referring provider: [name], MD [address] [place],  [place] [address]  Chief Complaint  Patient presents with  . Hematuria    HPI: Kristy Gordon is a 34 y.o. female seen in consultation at the request of Beckey Rutter, NP for evaluations of bladder spasms and microscopic hematuria.  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  She presents with an approximately 6-week history of lower urinary tract symptoms including urinary frequency, urgency and after urination a spasmodic pain.  She has been on both Myrbetriq and oxybutynin with only mild improvement in her symptoms.  She had a urinalysis performed in November 2020 which showed 11-20 RBCs.  Denies gross hematuria.   PMH: Past Medical History:  Diagnosis Date  . Anxiety   . Hashimoto's thyroiditis   . 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 History of cervical cancer   . Hypothyroid   . Low back pain     Surgical History: Past Surgical History:  Procedure Laterality Date  . BUNIONECTOMY Right   . PORTA CATH INSERTION N/A 03/31/2018   Procedure: PORTA CATH INSERTION;  Surgeon: Algernon Huxley, MD;  Location: [facility];  Service: Cardiovascular;  Laterality: N/A;  . TONSILLECTOMY      Home Medications:  Allergies as of 04/17/2019      Reactions   Amoxicillin Rash   Penicillins Hives      Medication List       Accurate as of April 17, 2019 11:52 AM. If you have any questions, ask your nurse or doctor.        acetaminophen 500 MG tablet Commonly known as: TYLENOL Take 500 mg by mouth every 4 (four) hours as needed.   ALPRAZolam 1 MG tablet Commonly known as: XANAX TAKE 1 TABLET BY MOUTH THREE TIMES DAILY AS NEEDED   amphetamine-dextroamphetamine 10 MG tablet Commonly known as: Adderall Take 1 tablet (10 mg total) by mouth  daily as needed.   DULoxetine 30 MG capsule Commonly known as: CYMBALTA 2 capsules daily   estradiol 0.1 MG/24HR patch Commonly known as: Vivelle-Dot Place 1 patch (0.1 mg total) onto the skin 2 (two) times a week.   gabapentin 300 MG capsule Commonly known as: NEURONTIN TAKE 1 CAPSULE BY MOUTH THREE TIMES DAILY   levothyroxine 175 MCG tablet Commonly known as: SYNTHROID Take 1 tablet (175 mcg total) by mouth daily before breakfast.   lidocaine-prilocaine cream Commonly known as: EMLA Apply to affected area once   ondansetron 8 MG tablet Commonly known as: ZOFRAN Take 8 mg by mouth every 8 (eight) hours as needed for nausea or vomiting.   oxybutynin 10 MG 24 hr tablet Commonly known as: Ditropan XL Take 1 tablet (10 mg total) by mouth at bedtime.   progesterone 200 MG capsule Commonly known as: Prometrium Take 200 mg by mouth at bedtime with food for 12 days sequentially per 28 day cycle.   promethazine 25 MG tablet Commonly known as: PHENERGAN TAKE 1 2 TO 1 (ONE HALF TO ONE) TABLET BY MOUTH EVERY 12 HOURS AS NEEDED FOR NAUSEA       Allergies:  Allergies  Allergen Reactions  . Amoxicillin Rash  . Penicillins Hives    Family History: Family History  Adopted: Yes  Family history unknown: Yes    Social History:  reports that she has been smoking cigarettes. She has a 1.50 pack-year smoking history. She has never used smokeless tobacco. She reports current alcohol use. She reports current drug use. Drug: Marijuana.  ROS: UROLOGY Frequent Urination?: Yes Hard to postpone urination?: Yes Burning/pain with urination?: Yes Get up at night to urinate?: No Leakage of urine?: Yes Urine stream starts and stops?: No Trouble starting stream?: No Do you have to strain to urinate?: No Blood in urine?: Yes Urinary tract infection?: No Sexually transmitted disease?: No Injury to kidneys or bladder?: Yes Painful intercourse?: No Weak stream?: No Currently  pregnant?: No Vaginal bleeding?: Yes  Gastrointestinal Nausea?: Yes Vomiting?: No Indigestion/heartburn?: No Diarrhea?: No Constipation?: No  Constitutional Fever: No Night sweats?: No Weight loss?: No Fatigue?: Yes  Skin Skin rash/lesions?: No Itching?: No  Eyes Blurred vision?: No Double vision?: No  Ears/Nose/Throat Sore throat?: No Sinus problems?: No  Hematologic/Lymphatic Swollen glands?: Yes Easy bruising?: No  Cardiovascular Leg swelling?: No Chest pain?: No  Respiratory Cough?: No Shortness of breath?: Yes  Endocrine Excessive thirst?: Yes  Musculoskeletal Back pain?: Yes Joint pain?: Yes  Neurological Headaches?: No Dizziness?: No  Psychologic Depression?: Yes Anxiety?: Yes  Physical Exam: BP 127/81 (BP Location: Left Arm, Patient Position: Sitting, Cuff Size: Large)   Pulse (!) 117   Ht 5\' 7"  (1.702 m)   Wt 290 lb (131.5 kg)   BMI 45.42 kg/m   Constitutional:  Alert and oriented, No acute distress. HEENT: [place] AT, moist mucus membranes.  Trachea midline, no masses. Cardiovascular: No clubbing, cyanosis, or edema. Respiratory: Normal respiratory effort, no increased work of breathing. Skin: No rashes, bruises or suspicious lesions. Neurologic: Grossly intact, no focal deficits, moving all 4 extremities. Psychiatric: Normal mood and affect.  Laboratory Data:  Urinalysis Dipstick 2+ blood/2+ protein Microscopy no RBCs, 0-5 WBCs   Assessment & Plan:    - Lower urinary tract symptoms Most likely secondary to her pelvic radiation.  She has tried Myrbetriq and oxybutynin but not combination therapy.  She presently is taking only oxybutynin and was given Myrbetriq samples 25 mg to take in conjunction with the oxybutynin.   -Microhematuria Urinalysis today shows no significant microhematuria.  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  Will schedule cystoscopy.   Return in about 4 weeks (around  05/15/2019) for CYSTO.  [name], [place] [address] [place], [place] [address] [phone]

## 2019-04-17 NOTE — Patient Instructions (Signed)
Cystoscopy Cystoscopy is a procedure that is used to help diagnose and sometimes treat conditions that affect the lower urinary tract. The lower urinary tract includes the bladder and the urethra. The urethra is the tube that drains urine from the bladder. Cystoscopy is done using a thin, tube-shaped instrument with a light and camera at the end (cystoscope). The cystoscope may be hard or flexible, depending on the goal of the procedure. The cystoscope is inserted through the urethra, into the bladder. Cystoscopy may be recommended if you have:  Urinary tract infections that keep coming back.  Blood in the urine (hematuria).  An inability to control when you urinate (urinary incontinence) or an overactive bladder.  Unusual cells found in a urine sample.  A blockage in the urethra, such as a urinary stone.  Painful urination.  An abnormality in the bladder found during an intravenous pyelogram (IVP) or CT scan. Cystoscopy may also be done to remove a sample of tissue to be examined under a microscope (biopsy). Tell a health care provider about:  Any allergies you have.  All medicines you are taking, including vitamins, herbs, eye drops, creams, and over-the-counter medicines.  Any problems you or family members have had with anesthetic medicines.  Any blood disorders you have.  Any surgeries you have had.  Any medical conditions you have.  Whether you are pregnant or may be pregnant. What are the risks? Generally, this is a safe procedure. However, problems may occur, including:  Infection.  Bleeding.  Allergic reactions to medicines.  Damage to other structures or organs. What happens before the procedure?  Ask your health care provider about: ? Changing or stopping your regular medicines. This is especially important if you are taking diabetes medicines or blood thinners. ? Taking medicines such as aspirin and ibuprofen. These medicines can thin your blood. Do not take  these medicines unless your health care provider tells you to take them. ? Taking over-the-counter medicines, vitamins, herbs, and supplements.  Follow instructions from your health care provider about eating or drinking restrictions.  Ask your health care provider what steps will be taken to help prevent infection. These may include: ? Washing skin with a germ-killing soap. ? Taking antibiotic medicine.  You may have an exam or testing, such as: ? X-rays of the bladder, urethra, or kidneys. ? Urine tests to check for signs of infection.  Plan to have someone take you home from the hospital or clinic. What happens during the procedure?   You will be given one or more of the following: ? A medicine to help you relax (sedative). ? A medicine to numb the area (local anesthetic).  The area around the opening of your urethra will be cleaned.  The cystoscope will be passed through your urethra into your bladder.  Germ-free (sterile) fluid will flow through the cystoscope to fill your bladder. The fluid will stretch your bladder so that your health care provider can clearly examine your bladder walls.  Your doctor will look at the urethra and bladder. Your doctor may take a biopsy or remove stones.  The cystoscope will be removed, and your bladder will be emptied. The procedure may vary among health care providers and hospitals. What can I expect after the procedure? After the procedure, it is common to have:  Some soreness or pain in your abdomen and urethra.  Urinary symptoms. These include: ? Mild pain or burning when you urinate. Pain should stop within a few minutes after you urinate. This   may last for up to 1 week. ? A small amount of blood in your urine for several days. ? Feeling like you need to urinate but producing only a small amount of urine. Follow these instructions at home: Medicines  Take over-the-counter and prescription medicines only as told by your health care  provider.  If you were prescribed an antibiotic medicine, take it as told by your health care provider. Do not stop taking the antibiotic even if you start to feel better. General instructions  Return to your normal activities as told by your health care provider. Ask your health care provider what activities are safe for you.  Watch for any blood in your urine. If the amount of blood in your urine increases, call your health care provider.  Follow instructions from your health care provider about eating or drinking restrictions.  If a tissue sample was removed for testing (biopsy) during your procedure, it is up to you to get your test results. Ask your health care provider, or the department that is doing the test, when your results will be ready.  Drink enough fluid to keep your urine pale yellow.  Keep all follow-up visits as told by your health care provider. This is important. Contact a health care provider if you:  Have pain that gets worse or does not get better with medicine, especially pain when you urinate.  Have trouble urinating.  Have more blood in your urine. Get help right away if you:  Have blood clots in your urine.  Have abdominal pain.  Have a fever or chills.  Are unable to urinate. Summary  Cystoscopy is a procedure that is used to help diagnose and sometimes treat conditions that affect the lower urinary tract.  Cystoscopy is done using a thin, tube-shaped instrument with a light and camera at the end.  After the procedure, it is common to have some soreness or pain in your abdomen and urethra.  Watch for any blood in your urine. If the amount of blood in your urine increases, call your health care provider.  If you were prescribed an antibiotic medicine, take it as told by your health care provider. Do not stop taking the antibiotic even if you start to feel better. This information is not intended to replace advice given to you by your health care  provider. Make sure you discuss any questions you have with your health care provider. Document Revised: 03/15/2018 Document Reviewed: 03/15/2018 Elsevier Patient Education  [place].

## 2019-04-18 ENCOUNTER — Telehealth: Payer: Self-pay

## 2019-04-18 NOTE — Telephone Encounter (Signed)
Gordon, Kristy Fairly, MD  [name], [place]  Urinalysis showed no evidence of infection. Although there was positive blood on dipstick no RBCs were seen on microscopy and dipstick reading for blood is not considered reliable   Called pt no answer. Left detailed message per DPR. Advised pt to call back for questions or concerns.

## 2019-04-24 ENCOUNTER — Telehealth: Payer: Self-pay

## 2019-04-24 ENCOUNTER — Encounter: Payer: Self-pay | Admitting: Family Medicine

## 2019-04-24 DIAGNOSIS — E038 Other specified hypothyroidism: Secondary | ICD-10-CM

## 2019-04-24 MED ORDER — LEVOTHYROXINE SODIUM 175 MCG PO TABS: 175.0000 ug | ORAL_TABLET | Freq: Every day | ORAL | 1 refills | Status: DC

## 2019-04-24 NOTE — Telephone Encounter (Signed)
Patient send a MyChart message requesting a refill on her Levothyroxine 175 MCG. L.O.V. was on 04/04/2019 and medication send into pharmacy.

## 2019-04-27 ENCOUNTER — Encounter: Payer: Self-pay | Admitting: *Deleted

## 2019-05-09 ENCOUNTER — Other Ambulatory Visit: Payer: Self-pay | Admitting: Family Medicine

## 2019-05-09 ENCOUNTER — Encounter: Payer: Self-pay | Admitting: Oncology

## 2019-05-09 ENCOUNTER — Other Ambulatory Visit: Payer: Self-pay

## 2019-05-09 ENCOUNTER — Other Ambulatory Visit: Payer: Self-pay | Admitting: Oncology

## 2019-05-09 ENCOUNTER — Encounter: Payer: Self-pay | Admitting: Family Medicine

## 2019-05-09 DIAGNOSIS — F988 Other specified behavioral and emotional disorders with onset usually occurring in childhood and adolescence: Secondary | ICD-10-CM

## 2019-05-09 NOTE — Telephone Encounter (Signed)
Requested medication (s) are due for refill today: yes  Requested medication (s) are on the active medication list: yes  Last refill: 04/04/2019  Future visit scheduled: yes  Notes to clinic:  not delegated    Requested Prescriptions  Pending Prescriptions Disp Refills   ALPRAZolam (XANAX) 1 MG tablet [Pharmacy Med Name: ALPRAZOLAM 1 MG TAB] 90 tablet     Sig: TAKE ONE TABLET BY MOUTH 3 TIMES DAILY AS NEEDED      Not Delegated - Psychiatry:  Anxiolytics/Hypnotics Failed - 05/09/2019 11:20 AM      Failed - This refill cannot be delegated      Failed - Urine Drug Screen completed in last 360 days.      Passed - Valid encounter within last 6 months    Recent Outpatient Visits           1 month ago Attention deficit disorder, unspecified hyperactivity presence   [facility] [name], MD   3 months ago Cystitis   [place], [place], [place]   6 months ago Other specified hypothyroidism   [facility] [name], MD   1 year ago Severe anxiety with panic   [place], Vickki Muff, [place]   1 year ago Disorder of thyroid   [facility] [name], MD       Future Appointments             In 3 weeks [name], MD [facility], [place]

## 2019-05-11 ENCOUNTER — Inpatient Hospital Stay: Payer: Medicaid Other

## 2019-05-11 ENCOUNTER — Other Ambulatory Visit: Payer: Self-pay | Admitting: *Deleted

## 2019-05-11 DIAGNOSIS — F988 Other specified behavioral and emotional disorders with onset usually occurring in childhood and adolescence: Secondary | ICD-10-CM

## 2019-05-12 ENCOUNTER — Inpatient Hospital Stay: Payer: Medicaid Other

## 2019-05-12 ENCOUNTER — Telehealth: Payer: Self-pay | Admitting: Oncology

## 2019-05-12 MED ORDER — AMPHETAMINE-DEXTROAMPHETAMINE 10 MG PO TABS: 10.0000 mg | ORAL_TABLET | Freq: Every day | ORAL | 0 refills | Status: DC | PRN

## 2019-05-12 NOTE — Telephone Encounter (Signed)
Patient sent mychart message requesting to reschedule port flush as she was watching her friend's son while friend was in labor. Appt rescheduled per patient's request to 05-16-19.

## 2019-05-16 ENCOUNTER — Encounter: Payer: Self-pay | Admitting: Urology

## 2019-05-16 ENCOUNTER — Inpatient Hospital Stay: Payer: Medicaid Other | Attending: Oncology

## 2019-05-16 ENCOUNTER — Other Ambulatory Visit: Payer: Self-pay

## 2019-05-16 DIAGNOSIS — C539 Malignant neoplasm of cervix uteri, unspecified: Secondary | ICD-10-CM | POA: Diagnosis present

## 2019-05-16 DIAGNOSIS — Z95828 Presence of other vascular implants and grafts: Secondary | ICD-10-CM

## 2019-05-16 DIAGNOSIS — R109 Unspecified abdominal pain: Secondary | ICD-10-CM | POA: Diagnosis not present

## 2019-05-16 DIAGNOSIS — Z452 Encounter for adjustment and management of vascular access device: Secondary | ICD-10-CM | POA: Diagnosis not present

## 2019-05-16 MED ORDER — HEPARIN SOD (PORK) LOCK FLUSH 100 UNIT/ML IV SOLN
500.0000 [IU] | Freq: Once | INTRAVENOUS | Status: AC
  Administered 2019-05-16: 14:00:00 500 [IU] via INTRAVENOUS
  Filled 2019-05-16: qty 5

## 2019-05-16 MED ORDER — SODIUM CHLORIDE 0.9% FLUSH
10.0000 mL | Freq: Once | INTRAVENOUS | Status: AC
  Administered 2019-05-16: 14:00:00 10 mL via INTRAVENOUS
  Filled 2019-05-16: qty 10

## 2019-05-19 ENCOUNTER — Other Ambulatory Visit: Payer: Self-pay

## 2019-05-19 ENCOUNTER — Ambulatory Visit (INDEPENDENT_AMBULATORY_CARE_PROVIDER_SITE_OTHER): Payer: Medicaid Other | Admitting: Urology

## 2019-05-19 ENCOUNTER — Encounter: Payer: Self-pay | Admitting: Urology

## 2019-05-19 VITALS — BP 166/103 | HR 150 | Ht 67.0 in | Wt 280.0 lb

## 2019-05-19 DIAGNOSIS — R3129 Other microscopic hematuria: Secondary | ICD-10-CM | POA: Diagnosis not present

## 2019-05-19 DIAGNOSIS — R399 Unspecified symptoms and signs involving the genitourinary system: Secondary | ICD-10-CM

## 2019-05-19 LAB — URINALYSIS, COMPLETE
Bilirubin, UA: NEGATIVE
Glucose, UA: NEGATIVE
Ketones, UA: NEGATIVE
Nitrite, UA: NEGATIVE
Specific Gravity, UA: >1.03 — ABNORMAL HIGH (ref 1.005–1.030)
Urobilinogen, Ur: 0.2 mg/dL (ref 0.2–1.0)
pH, UA: 5 (ref 5.0–7.5)

## 2019-05-19 LAB — MICROSCOPIC EXAMINATION

## 2019-05-19 NOTE — Progress Notes (Signed)
   05/19/19  CC:  Chief Complaint  Patient presents with  . Cysto    HPI: 34 yo female initially seen 04/17/2019 with microhematuria and storage related voiding symptoms.  History T2N1 adenocarcinoma cervix treated with cis-platinum, external beam radiation and vaginal brachytherapy.  No improvement in her symptoms with combination oxybutynin/Myrbetriq.  She presents today for cystoscopy.  Since her last visit she has developed some left flank pain.  Blood pressure (!) 166/103, pulse (!) 150, height 5\' 7"  (1.702 m), weight 280 lb (127 kg).   Cystoscopy Procedure Note  Patient identification was confirmed, informed consent was obtained, and patient was prepped using Betadine solution.  Lidocaine jelly was administered per urethral meatus.    Procedure: - Flexible cystoscope introduced, without any difficulty.   - Thorough search of the bladder revealed:    normal urethral meatus    left lateral wall and bladder base with erythematous edematous bladder mucosa.  No solid or papillary lesions identified.  Unable to visualize the left ureteral orifice.    no stones    no ulcers     no tumors    no urethral polyps    no trabeculation  Post-Procedure: - Patient tolerated the procedure well  Assessment/ Plan: - Markedly abnormal urothelium left lateral wall/base.  Findings were discussed with Ms. Kuan and recommended bladder biopsy/TURBT.  She has developed left flank pain.  PET/CT in November was negative.  Will schedule CT abdomen pelvis.  -The procedure was discussed in detail including potential risks/complications of bleeding, infection, bladder injury.  It was stressed that this is more of a diagnostic procedure and may not improve her irritative symptoms.  All questions were answered and she desires to proceed.   [name], MD

## 2019-05-22 ENCOUNTER — Other Ambulatory Visit: Payer: Self-pay | Admitting: Family Medicine

## 2019-05-22 DIAGNOSIS — F988 Other specified behavioral and emotional disorders with onset usually occurring in childhood and adolescence: Secondary | ICD-10-CM

## 2019-05-23 MED ORDER — AMPHETAMINE-DEXTROAMPHETAMINE 10 MG PO TABS: 10.0000 mg | ORAL_TABLET | Freq: Every day | ORAL | 0 refills | Status: DC | PRN

## 2019-05-24 ENCOUNTER — Encounter: Payer: Self-pay | Admitting: Nurse Practitioner

## 2019-05-24 ENCOUNTER — Other Ambulatory Visit: Payer: Self-pay | Admitting: Radiology

## 2019-05-24 DIAGNOSIS — N3289 Other specified disorders of bladder: Secondary | ICD-10-CM

## 2019-05-24 DIAGNOSIS — R399 Unspecified symptoms and signs involving the genitourinary system: Secondary | ICD-10-CM

## 2019-05-26 ENCOUNTER — Other Ambulatory Visit: Payer: Self-pay | Admitting: Family Medicine

## 2019-05-26 DIAGNOSIS — R3129 Other microscopic hematuria: Secondary | ICD-10-CM

## 2019-05-29 ENCOUNTER — Other Ambulatory Visit: Payer: Medicaid Other

## 2019-05-29 ENCOUNTER — Other Ambulatory Visit: Payer: Self-pay

## 2019-05-29 DIAGNOSIS — R3129 Other microscopic hematuria: Secondary | ICD-10-CM | POA: Diagnosis not present

## 2019-05-30 ENCOUNTER — Encounter: Payer: Self-pay | Admitting: Oncology

## 2019-05-30 NOTE — Telephone Encounter (Signed)
ok 

## 2019-05-30 NOTE — Progress Notes (Signed)
Patient: Kristy Gordon Female    DOB: [birth year]   34 y.o.   MRN: [id number] Visit Date: 05/31/2019  Today's Provider: Wilhemena Durie, MD   Chief Complaint  Patient presents with  . ADD   Subjective:     HPI   Patient has been in good compliance with medication.  She is having no side effects with that and that she thinks it is helping her with focus.  She is not taking that on days off. She is soon to have further surgeries from results of radiation treatment for her cervical cancer. Attention deficit disorder, unspecified hyperactivity presence From 04/04/2019-advised to try Adderall 10 mg every morning before studies.  Return to clinic 1 month.  Thyroiditis From 04/04/2019-Referred to endocrine clinic for consideration of treatment options.  Malignant neoplasm of cervix, unspecified site [facility]) From 04/04/2019-Per oncology  Class 3 severe obesity due to excess calories without serious comorbidity with body mass index (BMI) of 45.0 to 49.9 in adult [facility]) From 04/04/2019-advised Gust diet and exercise with goal of losing 2 to 4 pounds per month in 2021.     Allergies  Allergen Reactions  . Amoxicillin Rash    Did it involve swelling of the face/tongue/throat, SOB, or low BP? No Did it involve sudden or severe rash/hives, skin peeling, or any reaction on the inside of your mouth or nose? No Did you need to seek medical attention at a hospital or doctor's office? No When did it last happen?Childhood If all above answers are "NO", may proceed with cephalosporin use.   Marland Kitchen Penicillins Hives    Did it involve swelling of the face/tongue/throat, SOB, or low BP? No Did it involve sudden or severe rash/hives, skin peeling, or any reaction on the inside of your mouth or nose? No Did you need to seek medical attention at a hospital or doctor's office? No When did it last happen?Childhood If all above answers are "NO", may proceed with cephalosporin  use.      Current Outpatient Medications:  .  ALPRAZolam (XANAX) 1 MG tablet, TAKE ONE TABLET BY MOUTH 3 TIMES DAILY AS NEEDED (Patient taking differently: Take 1 mg by mouth 3 (three) times daily as needed for anxiety. ), Disp: 90 tablet, Rfl: 3 .  amphetamine-dextroamphetamine (ADDERALL) 10 MG tablet, Take 1 tablet (10 mg total) by mouth daily as needed., Disp: 60 tablet, Rfl: 0 .  DULoxetine (CYMBALTA) 30 MG capsule, 2 capsules daily (Patient taking differently: Take 60 mg by mouth daily. 2 capsules dail), Disp: 60 capsule, Rfl: 3 .  estradiol (VIVELLE-DOT) 0.1 MG/24HR patch, Place 1 patch (0.1 mg total) onto the skin 2 (two) times a week., Disp: 8 patch, Rfl: 12 .  gabapentin (NEURONTIN) 300 MG capsule, TAKE 1 CAPSULE BY MOUTH THREE TIMES DAILY (Patient taking differently: Take 300 mg by mouth 3 (three) times daily. ), Disp: 90 capsule, Rfl: 0 .  levothyroxine (SYNTHROID) 175 MCG tablet, Take 1 tablet (175 mcg total) by mouth daily before breakfast., Disp: 90 tablet, Rfl: 1 .  lidocaine-prilocaine (EMLA) cream, Apply to affected area once (Patient taking differently: Apply 1 application topically daily as needed (port access). ), Disp: 30 g, Rfl: 3 .  ondansetron (ZOFRAN) 8 MG tablet, Take 8 mg by mouth every 8 (eight) hours as needed for nausea or vomiting., Disp: , Rfl:  .  oxybutynin (DITROPAN XL) 10 MG 24 hr tablet, Take 1 tablet (10 mg total) by mouth at bedtime., Disp: 30  tablet, Rfl: 3 .  progesterone (PROMETRIUM) 200 MG capsule, Take 200 mg by mouth at bedtime with food for 12 days sequentially per 28 day cycle., Disp: 12 capsule, Rfl: 11 .  promethazine (PHENERGAN) 25 MG tablet, Take 12.5-25 mg by mouth every 12 (twelve) hours as needed for nausea or vomiting. , Disp: , Rfl:  No current facility-administered medications for this visit.  Facility-Administered Medications Ordered in Other Visits:  .  sodium chloride flush (NS) 0.9 % injection 10 mL, 10 mL, Intravenous, PRN, Sindy Guadeloupe, MD, 10 mL at 04/15/18 0825  Review of Systems  Constitutional: Negative for appetite change, chills, fatigue and fever.  Respiratory: Negative for chest tightness and shortness of breath.   Cardiovascular: Negative for chest pain and palpitations.  Gastrointestinal: Negative for abdominal pain, nausea and vomiting.  Neurological: Negative for dizziness and weakness.  Hematological: Negative.   Psychiatric/Behavioral: Negative.     Social History   Tobacco Use  . Smoking status: Current Some Day Smoker    Packs/day: 0.15    Years: 10.00    Pack years: 1.50    Types: Cigarettes  . Smokeless tobacco: Never Used  . Tobacco comment: 2 weeks to use a pack  Substance Use Topics  . Alcohol use: Yes    Comment: rare      Objective:   There were no vitals taken for this visit. There were no vitals filed for this visit.There is no height or weight on file to calculate BMI.   Physical Exam Vitals reviewed.  Constitutional:      Appearance: She is obese.  HENT:     Right Ear: External ear normal.     Left Ear: External ear normal.  Eyes:     General: No scleral icterus. Neck:     Comments: Goiter present.,stable. Cardiovascular:     Rate and Rhythm: Normal rate and regular rhythm.     Heart sounds: Normal heart sounds.  Pulmonary:     Effort: Pulmonary effort is normal.     Breath sounds: Normal breath sounds.  Abdominal:     Palpations: Abdomen is soft.  Skin:    General: Skin is warm and dry.  Neurological:     General: No focal deficit present.     Mental Status: She is alert and oriented to person, place, and time.  Psychiatric:        Mood and Affect: Mood normal.        Behavior: Behavior normal.        Thought Content: Thought content normal.        Judgment: Judgment normal.      No results found for any visits on 05/31/19.     Assessment & Plan    1. Attention deficit disorder (ADD) without hyperactivity Continue low-dose Adderall daily  as needed.  Follow-up 3 to 4 months.  2. Hypothyroidism due to Hashimoto's thyroiditis   3. Goiter   4. Neuropathy   5. Adenocarcinoma of cervix [facility]) Patient has further surgery by urology next week due to treatment of adenocarcinoma with radiation.  6. Vaginal pain Due to #5.  7. Tobacco use disorder   8. Class 3 severe obesity due to excess calories without serious comorbidity with body mass index (BMI) of 45.0 to 49.9 in adult [facility]) Patient is actually gained weight since last visit.  Instructed her to work on her diet and exercise habits over time.     Richard Cranford Mon, MD  [facility]  [facility]  

## 2019-05-31 ENCOUNTER — Other Ambulatory Visit: Payer: Self-pay

## 2019-05-31 ENCOUNTER — Ambulatory Visit (INDEPENDENT_AMBULATORY_CARE_PROVIDER_SITE_OTHER): Payer: Medicaid Other | Admitting: Family Medicine

## 2019-05-31 ENCOUNTER — Encounter: Payer: Self-pay | Admitting: Family Medicine

## 2019-05-31 VITALS — BP 124/83 | HR 93 | Temp 96.8°F | Ht 67.0 in | Wt 304.2 lb

## 2019-05-31 DIAGNOSIS — E038 Other specified hypothyroidism: Secondary | ICD-10-CM | POA: Diagnosis not present

## 2019-05-31 DIAGNOSIS — E049 Nontoxic goiter, unspecified: Secondary | ICD-10-CM

## 2019-05-31 DIAGNOSIS — F988 Other specified behavioral and emotional disorders with onset usually occurring in childhood and adolescence: Secondary | ICD-10-CM

## 2019-05-31 DIAGNOSIS — R102 Pelvic and perineal pain: Secondary | ICD-10-CM

## 2019-05-31 DIAGNOSIS — G629 Polyneuropathy, unspecified: Secondary | ICD-10-CM | POA: Diagnosis not present

## 2019-05-31 DIAGNOSIS — E063 Autoimmune thyroiditis: Secondary | ICD-10-CM

## 2019-05-31 DIAGNOSIS — Z6841 Body Mass Index (BMI) 40.0 and over, adult: Secondary | ICD-10-CM

## 2019-05-31 DIAGNOSIS — C539 Malignant neoplasm of cervix uteri, unspecified: Secondary | ICD-10-CM

## 2019-05-31 DIAGNOSIS — F17200 Nicotine dependence, unspecified, uncomplicated: Secondary | ICD-10-CM

## 2019-05-31 LAB — CULTURE, URINE COMPREHENSIVE

## 2019-05-31 MED ORDER — AMPHETAMINE-DEXTROAMPHETAMINE 10 MG PO TABS: 10.0000 mg | ORAL_TABLET | Freq: Every day | ORAL | 0 refills | Status: DC | PRN

## 2019-06-01 ENCOUNTER — Other Ambulatory Visit: Payer: Self-pay

## 2019-06-01 ENCOUNTER — Encounter
Admission: RE | Admit: 2019-06-01 | Discharge: 2019-06-01 | Disposition: A | Discharge status: Home / Self Care | Payer: Medicaid Other | Source: Ambulatory Visit | Attending: Urology | Admitting: Urology

## 2019-06-01 NOTE — Patient Instructions (Addendum)
Your Drive-up COVID test is scheduled on:  Friday 06/02/19 in front of the [facility] any time 8:00-10:30am  Your procedure is scheduled on: Tuesday 06/06/19 Report to Same Day Surgery- Enter through the [facility], then take elevator on left to 2nd floor.  Check in with surgery information desk. To find out your arrival time, call [phone] 1:00-3:00 PM on Monday 06/05/19  Remember: Instructions that are not followed completely may result in serious medical risk, up to and including death, or upon the discretion of your surgeon and anesthesiologist your surgery may need to be rescheduled.   __x__ 1. Do not eat food (including mints, candies, chewing gum) after midnight the night before your procedure. You may drink clear liquids up to 2 hours before you are scheduled to arrive at the hospital for your procedure.  Do not drink anything within 2 hours of your scheduled arrival to the hospital.  Approved clear liquids: --Water or Apple juice without pulp --Clear carbohydrate beverage such as Gatorade or Powerade --Black Coffee or Clear Tea (No milk, no creamers, do not add anything to the coffee or tea)   __x__ 2. No Alcohol or smoking for 24 hours before or after surgery.  __x__ 3. Notify your doctor if there is any change in your medical condition (cold, fever, infections).  __x__ 4. On the morning of surgery brush your teeth with toothpaste and water.  You may rinse your mouth with mouthwash if you wish.  Do not swallow any toothpaste or mouthwash.  Please read over the following fact sheets that you were given: MRSA Information, Advance Directives  __x__ Use antibacterial soap such as Dial to shower/bathe on the day of surgery.  Do not wear jewelry, make-up, hairpins, clips or nail polish on the day of surgery. Do not wear lotions, powders, deodorant, or perfumes.  Do not shave below the face/neck 48 hours prior to surgery.  Do not bring valuables to the  hospital.  [facility] is not responsible for any belongings or valuables.  For patients discharged on the day of surgery, you will NOT be permitted to drive yourself home.  You must have a responsible adult with you for 24 hours after surgery.  __x__ Take these medications on the morning of surgery with a SMALL SIP OF WATER:  1. Methadone  2. Xanax if needed  You may take your evening medications as normal the night before surgery.  __x__ Follow recommendations from Cardiologist, Pulmonologist or PCP if you are taking any blood thinners such as Aspirin, Coumadin, Plavix, Eliquis, Effient, Pradaxa, and Pletal.  __x__ Do not take any Anti-inflammatories until after surgery such as Advil, Ibuprofen, Motrin, Aleve, Naproxen, Naprosyn, BC/Goodies powders or aspirin products. You may take Tylenol if needed.   __x__ Do not take any over the counter vitamins/ supplements until after surgery.  RN reviewed instructions via telephone interview, patient expressed understanding and will receive printed copy on day of [place] visit.    __________________________ 06/01/19 @ 10:56 am phone call

## 2019-06-02 ENCOUNTER — Other Ambulatory Visit
Admission: RE | Admit: 2019-06-02 | Discharge: 2019-06-02 | Disposition: A | Discharge status: Home / Self Care | Payer: Medicaid Other | Source: Ambulatory Visit | Attending: Urology | Admitting: Urology

## 2019-06-02 ENCOUNTER — Inpatient Hospital Stay (HOSPITAL_BASED_OUTPATIENT_CLINIC_OR_DEPARTMENT_OTHER): Payer: Medicaid Other | Admitting: Oncology

## 2019-06-02 ENCOUNTER — Other Ambulatory Visit: Payer: Self-pay

## 2019-06-02 ENCOUNTER — Inpatient Hospital Stay: Payer: Medicaid Other

## 2019-06-02 ENCOUNTER — Encounter: Payer: Self-pay | Admitting: Oncology

## 2019-06-02 ENCOUNTER — Ambulatory Visit: Payer: Medicaid Other | Admitting: Radiation Oncology

## 2019-06-02 VITALS — BP 169/108 | HR 116 | Temp 95.5°F | Resp 16 | Wt 303.6 lb

## 2019-06-02 DIAGNOSIS — Z8541 Personal history of malignant neoplasm of cervix uteri: Secondary | ICD-10-CM | POA: Diagnosis not present

## 2019-06-02 DIAGNOSIS — Z01812 Encounter for preprocedural laboratory examination: Secondary | ICD-10-CM | POA: Insufficient documentation

## 2019-06-02 DIAGNOSIS — R3129 Other microscopic hematuria: Secondary | ICD-10-CM

## 2019-06-02 DIAGNOSIS — Z95828 Presence of other vascular implants and grafts: Secondary | ICD-10-CM

## 2019-06-02 DIAGNOSIS — Z08 Encounter for follow-up examination after completed treatment for malignant neoplasm: Secondary | ICD-10-CM | POA: Diagnosis not present

## 2019-06-02 DIAGNOSIS — C539 Malignant neoplasm of cervix uteri, unspecified: Secondary | ICD-10-CM

## 2019-06-02 DIAGNOSIS — N3090 Cystitis, unspecified without hematuria: Secondary | ICD-10-CM

## 2019-06-02 DIAGNOSIS — M6289 Other specified disorders of muscle: Secondary | ICD-10-CM

## 2019-06-02 DIAGNOSIS — Z20822 Contact with and (suspected) exposure to covid-19: Secondary | ICD-10-CM | POA: Diagnosis not present

## 2019-06-02 DIAGNOSIS — D500 Iron deficiency anemia secondary to blood loss (chronic): Secondary | ICD-10-CM

## 2019-06-02 LAB — COMPREHENSIVE METABOLIC PANEL
ALT: 36 U/L (ref 0–44)
AST: 34 U/L (ref 15–41)
Albumin: 4 g/dL (ref 3.5–5.0)
Alkaline Phosphatase: 74 U/L (ref 38–126)
Anion gap: 10 (ref 5–15)
BUN: 10 mg/dL (ref 6–20)
CO2: 21 mmol/L — ABNORMAL LOW (ref 22–32)
Calcium: 8.9 mg/dL (ref 8.9–10.3)
Chloride: 107 mmol/L (ref 98–111)
Creatinine, Ser: 0.76 mg/dL (ref 0.44–1.00)
GFR calc Af Amer: >60 mL/min (ref >60)
GFR calc non Af Amer: >60 mL/min (ref >60)
Glucose, Bld: 121 mg/dL — ABNORMAL HIGH (ref 70–99)
Potassium: 3.5 mmol/L (ref 3.5–5.1)
Sodium: 138 mmol/L (ref 135–145)
Total Bilirubin: 0.4 mg/dL (ref 0.3–1.2)
Total Protein: 7.7 g/dL (ref 6.5–8.1)

## 2019-06-02 LAB — CBC WITH DIFFERENTIAL/PLATELET
Abs Immature Granulocytes: 0.02 10*3/uL (ref 0.00–0.07)
Basophils Absolute: 0.1 10*3/uL (ref 0.0–0.1)
Basophils Relative: 1 %
Eosinophils Absolute: 0.1 10*3/uL (ref 0.0–0.5)
Eosinophils Relative: 1 %
HCT: 38 % (ref 36.0–46.0)
Hemoglobin: 13.1 g/dL (ref 12.0–15.0)
Immature Granulocytes: 0 %
Lymphocytes Relative: 37 %
Lymphs Abs: 2.6 10*3/uL (ref 0.7–4.0)
MCH: 32 pg (ref 26.0–34.0)
MCHC: 34.5 g/dL (ref 30.0–36.0)
MCV: 92.9 fL (ref 80.0–100.0)
Monocytes Absolute: 0.6 10*3/uL (ref 0.1–1.0)
Monocytes Relative: 9 %
Neutro Abs: 3.7 10*3/uL (ref 1.7–7.7)
Neutrophils Relative %: 52 %
Platelets: 378 10*3/uL (ref 150–400)
RBC: 4.09 MIL/uL (ref 3.87–5.11)
RDW: 12.4 % (ref 11.5–15.5)
WBC: 7.1 10*3/uL (ref 4.0–10.5)
nRBC: 0 % (ref 0.0–0.2)

## 2019-06-02 LAB — SARS CORONAVIRUS 2 (TAT 6-24 HRS): SARS Coronavirus 2: NEGATIVE

## 2019-06-02 MED ORDER — HEPARIN SOD (PORK) LOCK FLUSH 100 UNIT/ML IV SOLN
INTRAVENOUS | Status: AC
  Filled 2019-06-02: qty 5

## 2019-06-02 MED ORDER — KETOROLAC TROMETHAMINE 15 MG/ML IJ SOLN
30.0000 mg | Freq: Once | INTRAMUSCULAR | Status: AC
  Administered 2019-06-02: 30 mg via INTRAVENOUS
  Filled 2019-06-02: qty 2

## 2019-06-02 MED ORDER — HEPARIN SOD (PORK) LOCK FLUSH 100 UNIT/ML IV SOLN
500.0000 [IU] | Freq: Once | INTRAVENOUS | Status: AC
  Administered 2019-06-02: 500 [IU] via INTRAVENOUS
  Filled 2019-06-02: qty 5

## 2019-06-02 MED ORDER — SODIUM CHLORIDE 0.9 % IV SOLN
INTRAVENOUS | Status: DC
  Filled 2019-06-02: qty 250

## 2019-06-02 NOTE — Progress Notes (Signed)
Patient stated that she will be having surgery on Tuesday by Dr. Bernardo Heater.

## 2019-06-05 MED ORDER — CIPROFLOXACIN IN D5W 400 MG/200ML IV SOLN: 400.0000 mg | INTRAVENOUS | Status: DC

## 2019-06-05 NOTE — Progress Notes (Signed)
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 Positive pelvic LN  Chief complaint/ Reason for visit-routine follow-up of cervical cancer  Heme/Onc history: patient is a 34 year old female G105 who initially presented to the ER on 03/15/2018 with symptoms of abdominal pain and cramping. She has also been having irregular menstrual bleeding and spotting on and off for the last few months.She had an ultrasound pelvis done in the ER which showed a large 5.8 cm hypoechoic vascular mass within the cervix and the lower uterine segment. Patient had last seen GYN in 2013 and had not had a Pap smear done since then. Patient was seen by GYN Dr. Nechama Guard on 1213 and was found to have firm friable cervix and multiple biopsies were taken which showed adenocarcinoma. She was then seen by Dr. Fransisca Connors from GYN oncology. Pelvic exam showed anteverted cervix that was replaced by tumor and extension of cancer into the left parametrium and possibly to the pelvic sidewall.  PET CT scan on 03/28/2018 showed 6 cm hypermetabolic cervical mass consistent with primary cervical carcinoma. Mild hypermetabolic bilateral parametrial right perirectal bilateral iliac lymph nodes consistent with metastatic disease. No evidence of metastatic disease within the abdomen chest or neck.  Cycle 1 of weekly cisplatin and radiation started on 04/11/2018.  She completed chemoradiation on 05/19/2018 followed by vaginal brachii therapy.  PET CT scan thereafter did not show any evidence of metastatic disease and interval resolution of hypermetabolic pelvic adenopathy and no hypermetabolism seen in the area of the cervix.   Interval history-patient was having symptoms of intermittent hematuria and was referred to urology.  She underwent cystoscopy which showed erythematous bladder mucosa.  She is scheduled for TURBT on 06/06/2019.  She continues to struggle with intermittent pelvic pain for which she takes as needed Tylenol.  She has been requiring rare doses of Toradol when her pain is out of control  ECOG PS- 0 Pain scale- 0  Review of systems- Review of Systems  Constitutional: Negative for chills, fever, malaise/fatigue and weight loss.  HENT: Negative for congestion, ear discharge and nosebleeds.   Eyes: Negative for blurred vision.  Respiratory: Negative for cough, hemoptysis, sputum production, shortness of breath and wheezing.   Cardiovascular: Negative for chest pain, palpitations, orthopnea and claudication.  Gastrointestinal: Negative for abdominal pain, blood in stool, constipation, diarrhea, heartburn, melena, nausea and vomiting.  Genitourinary: Positive for hematuria. Negative for dysuria, flank pain, frequency and urgency.       Pelvic pain  Musculoskeletal: Negative for back pain, joint pain and myalgias.  Skin: Negative for rash.  Neurological: Negative for dizziness, tingling, focal weakness, seizures, weakness and headaches.  Endo/Heme/Allergies: Does not bruise/bleed easily.  Psychiatric/Behavioral: Negative for depression and suicidal ideas. The patient does not have insomnia.       Allergies  Allergen Reactions  . Amoxicillin Rash    Did it involve swelling of the face/tongue/throat, SOB, or low BP? No Did it involve sudden or severe rash/hives, skin peeling, or any reaction  on the inside of your mouth or nose? No Did  you need to seek medical attention at a hospital or doctor's office? No When did it last happen?Childhood If all above answers are "NO", may proceed with cephalosporin use.   Marland Kitchen Penicillins Hives    Did it involve swelling of the face/tongue/throat, SOB, or low BP? No Did it involve sudden or severe rash/hives, skin peeling, or any reaction on the inside of your mouth or nose? No Did you need to seek medical attention at a hospital or doctor's office? No When did it last happen?Childhood If all above answers are "NO", may proceed with cephalosporin use.      Past Medical History:  Diagnosis Date  . Anxiety   . Cancer ([place])   . Hashimoto's thyroiditis   . 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 History of cervical cancer   . Hypothyroid   . Low back pain      Past Surgical History:  Procedure Laterality Date  . BUNIONECTOMY Right   . PORTA CATH INSERTION N/A 03/31/2018   Procedure: PORTA CATH INSERTION;  Surgeon: Algernon Huxley, MD;  Location: [place] [facility];  Service: Cardiovascular;  Laterality: N/A;  . TONSILLECTOMY      Social History   Socioeconomic History  . Marital status: Married    Spouse name: Not on file  . Number of children: Not on file  . Years of education: Not on file  . Highest education level: Not on file  Occupational History  . Not on file  Tobacco Use  . Smoking status: Current Some Day Smoker    Packs/day: 0.15    Years: 10.00    Pack years: 1.50    Types: Cigarettes  . Smokeless tobacco: Never Used  . Tobacco comment: 2 weeks to use a pack  Substance and Sexual Activity  . Alcohol use: Yes    Comment: rare  . Drug use: Yes    Types: Marijuana    Comment: quit opiates 5 years ago  . Sexual activity: Yes    Birth control/protection: None  Other Topics Concern  . Not on file  Social History Narrative  . Not on file   Social Determinants of Health   Financial Resource Strain:   . Difficulty of Paying Living  Expenses: Not on file  Food Insecurity:   . Worried About Charity fundraiser in the Last Year: Not on file  . Ran Out of Food in the Last Year: Not on file  Transportation Needs:   . Lack of Transportation (Medical): Not on file  . Lack of Transportation (Non-Medical): Not on file  Physical Activity:   . Days of Exercise per Week: Not on file  . Minutes of Exercise per Session: Not on file  Stress:   . Feeling of Stress : Not on file  Social Connections:   . Frequency of Communication with Friends and Family: Not on file  . Frequency of Social Gatherings with Friends and Family: Not on file  . Attends Religious Services: Not on file  . Active Member of Clubs or Organizations: Not on file  . Attends Archivist Meetings: Not on file  . Marital Status: Not on file  Intimate Partner Violence:   . Fear of Current or Ex-Partner: Not on file  . Emotionally Abused: Not on file  . Physically Abused: Not on file  . Sexually Abused: Not on file    Family  History  Adopted: Yes  Family history unknown: Yes     Current Outpatient Medications:  .  ALPRAZolam (XANAX) 1 MG tablet, TAKE ONE TABLET BY MOUTH 3 TIMES DAILY AS NEEDED (Patient taking differently: Take 1 mg by mouth 3 (three) times daily as needed for anxiety. ), Disp: 90 tablet, Rfl: 3 .  amphetamine-dextroamphetamine (ADDERALL) 10 MG tablet, Take 1 tablet (10 mg total) by mouth daily as needed., Disp: 60 tablet, Rfl: 0 .  [START ON 08/28/2019] amphetamine-dextroamphetamine (ADDERALL) 10 MG tablet, Take 1 tablet (10 mg total) by mouth daily as needed., Disp: 60 tablet, Rfl: 0 .  [START ON 10/28/2019] amphetamine-dextroamphetamine (ADDERALL) 10 MG tablet, Take 1 tablet (10 mg total) by mouth daily as needed., Disp: 60 tablet, Rfl: 0 .  DULoxetine (CYMBALTA) 30 MG capsule, 2 capsules daily (Patient taking differently: Take 60 mg by mouth daily. 2 capsules dail), Disp: 60 capsule, Rfl: 3 .  estradiol (VIVELLE-DOT) 0.1 MG/24HR  patch, Place 1 patch (0.1 mg total) onto the skin 2 (two) times a week., Disp: 8 patch, Rfl: 12 .  gabapentin (NEURONTIN) 300 MG capsule, TAKE 1 CAPSULE BY MOUTH THREE TIMES DAILY (Patient taking differently: Take 300 mg by mouth 3 (three) times daily. ), Disp: 90 capsule, Rfl: 0 .  levothyroxine (SYNTHROID) 175 MCG tablet, Take 1 tablet (175 mcg total) by mouth daily before breakfast., Disp: 90 tablet, Rfl: 1 .  METHADONE HCL PO, Take 15 mg by mouth daily at 6 (six) AM. , Disp: , Rfl:  .  ondansetron (ZOFRAN) 8 MG tablet, Take 8 mg by mouth every 8 (eight) hours as needed for nausea or vomiting., Disp: , Rfl:  .  oxybutynin (DITROPAN XL) 10 MG 24 hr tablet, Take 1 tablet (10 mg total) by mouth at bedtime., Disp: 30 tablet, Rfl: 3 .  progesterone (PROMETRIUM) 200 MG capsule, Take 200 mg by mouth at bedtime with food for 12 days sequentially per 28 day cycle., Disp: 12 capsule, Rfl: 11 .  promethazine (PHENERGAN) 25 MG tablet, Take 12.5-25 mg by mouth every 12 (twelve) hours as needed for nausea or vomiting. , Disp: , Rfl:  .  lidocaine-prilocaine (EMLA) cream, Apply to affected area once (Patient not taking: Reported on 06/02/2019), Disp: 30 g, Rfl: 3 No current facility-administered medications for this visit.  Facility-Administered Medications Ordered in Other Visits:  .  sodium chloride flush (NS) 0.9 % injection 10 mL, 10 mL, Intravenous, PRN, Sindy Guadeloupe, MD, 10 mL at 04/15/18 0825  Physical exam:  Vitals:   06/02/19 1008  BP: (!) 169/108  Pulse: (!) 116  Resp: 16  Temp: (!) 95.5 F (35.3 C)  TempSrc: Tympanic  SpO2: 95%  Weight: (!) 303 lb 9.6 oz (137.7 kg)   Physical Exam Constitutional:      General: She is not in acute distress. HENT:     Head: Normocephalic and atraumatic.  Eyes:     Pupils: Pupils are equal, round, and reactive to light.  Cardiovascular:     Rate and Rhythm: Regular rhythm. Tachycardia present.     Heart sounds: Normal heart sounds.  Pulmonary:      Effort: Pulmonary effort is normal.     Breath sounds: Normal breath sounds.  Abdominal:     General: Bowel sounds are normal.     Palpations: Abdomen is soft.  Musculoskeletal:     Cervical back: Normal range of motion.  Skin:    General: Skin is warm and dry.  Neurological:  Mental Status: She is alert and oriented to person, place, and time.      CMP Latest Ref Rng & Units 06/02/2019  Glucose 70 - 99 mg/dL 121(H)  BUN 6 - 20 mg/dL 10  Creatinine 0.44 - 1.00 mg/dL 0.76  Sodium 135 - 145 mmol/L 138  Potassium 3.5 - 5.1 mmol/L 3.5  Chloride 98 - 111 mmol/L 107  CO2 22 - 32 mmol/L 21(L)  Calcium 8.9 - 10.3 mg/dL 8.9  Total Protein 6.5 - 8.1 g/dL 7.7  Total Bilirubin 0.3 - 1.2 mg/dL 0.4  Alkaline Phos 38 - 126 U/L 74  AST 15 - 41 U/L 34  ALT 0 - 44 U/L 36   CBC Latest Ref Rng & Units 06/02/2019  WBC 4.0 - 10.5 K/uL 7.1  Hemoglobin 12.0 - 15.0 g/dL 13.1  Hematocrit 36.0 - 46.0 % 38.0  Platelets 150 - 400 K/uL 378    No images are attached to the encounter.  No results found.   Assessment and plan- Patient is a 34 y.o. female withadenocarcinoma of the cervix FIGO stage III C1r T2N1M0with metastases to the external iliac(pelvic lymph nodes).She is status post concurrent chemoradiation and vaginal brachytherapy currently in remission.  This is a routine follow-up visit for cervical cancer  1.  Microscopic hematuria: Currently being evaluated by urology and she is scheduled to undergo TURBT on 06/06/2019.  She also has a CT abdomen scheduled for next week as a part of hematuria work-up  2.  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  She is on as needed Tylenol.  She will get 1 dose of Toradol today.  I will see her back in 6 months with CBC with differential and CMP   Visit Diagnosis 1. Encounter for follow-up surveillance of cervical cancer      Dr. Randa Evens, MD, MPH [facility] [id number] 06/05/2019 10:53 AM

## 2019-06-06 ENCOUNTER — Encounter
Admission: RE | Disposition: A | Discharge status: Home / Self Care | Payer: Self-pay | Source: Home / Self Care | Attending: Urology

## 2019-06-06 ENCOUNTER — Ambulatory Visit
Admission: RE | Admit: 2019-06-06 | Discharge: 2019-06-06 | Disposition: A | Discharge status: Home / Self Care | Payer: Medicaid Other | Attending: Urology | Admitting: Urology

## 2019-06-06 ENCOUNTER — Ambulatory Visit: Payer: Medicaid Other | Admitting: Anesthesiology

## 2019-06-06 ENCOUNTER — Other Ambulatory Visit: Payer: Self-pay

## 2019-06-06 ENCOUNTER — Encounter: Payer: Self-pay | Admitting: Urology

## 2019-06-06 DIAGNOSIS — E063 Autoimmune thyroiditis: Secondary | ICD-10-CM | POA: Insufficient documentation

## 2019-06-06 DIAGNOSIS — R399 Unspecified symptoms and signs involving the genitourinary system: Secondary | ICD-10-CM

## 2019-06-06 DIAGNOSIS — M545 Low back pain: Secondary | ICD-10-CM | POA: Diagnosis not present

## 2019-06-06 DIAGNOSIS — Z5309 Procedure and treatment not carried out because of other contraindication: Secondary | ICD-10-CM | POA: Diagnosis present

## 2019-06-06 DIAGNOSIS — Z9221 Personal history of antineoplastic chemotherapy: Secondary | ICD-10-CM | POA: Diagnosis not present

## 2019-06-06 DIAGNOSIS — N3289 Other specified disorders of bladder: Secondary | ICD-10-CM

## 2019-06-06 DIAGNOSIS — C539 Malignant neoplasm of cervix uteri, unspecified: Secondary | ICD-10-CM | POA: Insufficient documentation

## 2019-06-06 DIAGNOSIS — R319 Hematuria, unspecified: Secondary | ICD-10-CM | POA: Diagnosis not present

## 2019-06-06 DIAGNOSIS — E039 Hypothyroidism, unspecified: Secondary | ICD-10-CM | POA: Insufficient documentation

## 2019-06-06 DIAGNOSIS — Z881 Allergy status to other antibiotic agents status: Secondary | ICD-10-CM | POA: Diagnosis not present

## 2019-06-06 DIAGNOSIS — Z880 Allergy status to penicillin: Secondary | ICD-10-CM | POA: Diagnosis not present

## 2019-06-06 LAB — URINE DRUG SCREEN, QUALITATIVE (ARMC ONLY)
Amphetamines, Ur Screen: NOT DETECTED
Barbiturates, Ur Screen: NOT DETECTED
Benzodiazepine, Ur Scrn: POSITIVE — AB
Cannabinoid 50 Ng, Ur ~~LOC~~: POSITIVE — AB
Cocaine Metabolite,Ur ~~LOC~~: POSITIVE — AB
MDMA (Ecstasy)Ur Screen: NOT DETECTED
Methadone Scn, Ur: POSITIVE — AB
Opiate, Ur Screen: NOT DETECTED
Phencyclidine (PCP) Ur S: NOT DETECTED
Tricyclic, Ur Screen: NOT DETECTED

## 2019-06-06 LAB — POCT PREGNANCY, URINE: Preg Test, Ur: NEGATIVE

## 2019-06-06 SURGERY — TURBT (TRANSURETHRAL RESECTION OF BLADDER TUMOR)
Anesthesia: General

## 2019-06-06 MED ORDER — PROPOFOL 10 MG/ML IV BOLUS
INTRAVENOUS | Status: AC
  Filled 2019-06-06: qty 20

## 2019-06-06 MED ORDER — GLYCOPYRROLATE 0.2 MG/ML IJ SOLN
INTRAMUSCULAR | Status: AC
  Filled 2019-06-06: qty 1

## 2019-06-06 MED ORDER — LACTATED RINGERS IV SOLN: INTRAVENOUS | Status: DC

## 2019-06-06 MED ORDER — SUGAMMADEX SODIUM 200 MG/2ML IV SOLN
INTRAVENOUS | Status: AC
  Filled 2019-06-06: qty 2

## 2019-06-06 MED ORDER — LIDOCAINE HCL (PF) 2 % IJ SOLN
INTRAMUSCULAR | Status: AC
  Filled 2019-06-06: qty 5

## 2019-06-06 MED ORDER — FAMOTIDINE 20 MG PO TABS
ORAL_TABLET | ORAL | Status: AC
  Administered 2019-06-06: 20 mg via ORAL
  Filled 2019-06-06: qty 1

## 2019-06-06 MED ORDER — FAMOTIDINE 20 MG PO TABS: 20.0000 mg | ORAL_TABLET | Freq: Once | ORAL | Status: AC

## 2019-06-06 MED ORDER — ONDANSETRON HCL 4 MG/2ML IJ SOLN
INTRAMUSCULAR | Status: AC
  Filled 2019-06-06: qty 2

## 2019-06-06 MED ORDER — CIPROFLOXACIN IN D5W 400 MG/200ML IV SOLN
INTRAVENOUS | Status: AC
  Filled 2019-06-06: qty 200

## 2019-06-06 MED ORDER — MIDAZOLAM HCL 2 MG/2ML IJ SOLN
INTRAMUSCULAR | Status: AC
  Filled 2019-06-06: qty 2

## 2019-06-06 MED ORDER — DEXAMETHASONE SODIUM PHOSPHATE 10 MG/ML IJ SOLN
INTRAMUSCULAR | Status: AC
  Filled 2019-06-06: qty 1

## 2019-06-06 MED ORDER — FENTANYL CITRATE (PF) 100 MCG/2ML IJ SOLN
INTRAMUSCULAR | Status: AC
  Filled 2019-06-06: qty 2

## 2019-06-06 MED ORDER — ACETAMINOPHEN 10 MG/ML IV SOLN
INTRAVENOUS | Status: AC
  Filled 2019-06-06: qty 100

## 2019-06-06 SURGICAL SUPPLY — 30 items
BAG DRAIN CYSTO-URO LG1000N (MISCELLANEOUS) ×4 IMPLANT
BAG URINE DRAIN 2000ML AR STRL (UROLOGICAL SUPPLIES) ×4 IMPLANT
BRUSH SCRUB EZ  4% CHG (MISCELLANEOUS) ×2
BRUSH SCRUB EZ 4% CHG (MISCELLANEOUS) ×2 IMPLANT
CATH FOLEY 2WAY 18X30 (CATHETERS) IMPLANT
CATH FOLEY 2WAY SIL 18X30 (CATHETERS)
CATH URETL 5X70 OPEN END (CATHETERS) ×4 IMPLANT
DRAPE UTILITY 15X26 TOWEL STRL (DRAPES) ×4 IMPLANT
DRSG TELFA 4X3 1S NADH ST (GAUZE/BANDAGES/DRESSINGS) ×4 IMPLANT
ELECT LOOP 22F BIPOLAR SML (ELECTROSURGICAL)
ELECT REM PT RETURN 9FT ADLT (ELECTROSURGICAL)
ELECTRODE LOOP 22F BIPOLAR SML (ELECTROSURGICAL) IMPLANT
ELECTRODE REM PT RTRN 9FT ADLT (ELECTROSURGICAL) IMPLANT
GLOVE BIO SURGEON STRL SZ8 (GLOVE) ×4 IMPLANT
GOWN STRL REUS W/ TWL LRG LVL3 (GOWN DISPOSABLE) ×2 IMPLANT
GOWN STRL REUS W/TWL LRG LVL3 (GOWN DISPOSABLE) ×2
GOWN STRL REUS W/TWL XL LVL3 (GOWN DISPOSABLE) ×4 IMPLANT
GOWN STRL REUS W/TWL XL LVL4 (GOWN DISPOSABLE) ×4 IMPLANT
GUIDEWIRE STR DUAL SENSOR (WIRE) ×4 IMPLANT
IV NS IRRIG 3000ML ARTHROMATIC (IV SOLUTION) ×8 IMPLANT
KIT TURNOVER CYSTO (KITS) ×4 IMPLANT
LOOP CUT BIPOLAR 24F LRG (ELECTROSURGICAL) IMPLANT
PACK CYSTO AR (MISCELLANEOUS) ×4 IMPLANT
SET CYSTO W/LG BORE CLAMP LF (SET/KITS/TRAYS/PACK) ×4 IMPLANT
SET IRRIG Y TYPE TUR BLADDER L (SET/KITS/TRAYS/PACK) ×4 IMPLANT
SOL .9 NS 3000ML IRR  AL (IV SOLUTION) ×2
SOL .9 NS 3000ML IRR UROMATIC (IV SOLUTION) ×2 IMPLANT
SURGILUBE 2OZ TUBE FLIPTOP (MISCELLANEOUS) ×4 IMPLANT
SYRINGE IRR TOOMEY STRL 70CC (SYRINGE) ×4 IMPLANT
WATER STERILE IRR 1000ML POUR (IV SOLUTION) ×4 IMPLANT

## 2019-06-06 NOTE — Progress Notes (Signed)
Patient positive for cocaine. Procedure canceled by Dr Randa Lynn.

## 2019-06-06 NOTE — Anesthesia Preprocedure Evaluation (Deleted)
Anesthesia Evaluation  Patient identified by MRN, date of birth, ID band Patient awake    Reviewed: Allergy & Precautions, NPO status , Patient's Chart, lab work & pertinent test results  History of Anesthesia Complications Negative for: history of anesthetic complications  Airway Mallampati: II  TM Distance: >3 FB Neck ROM: Full    Dental no notable dental hx.    Pulmonary neg sleep apnea, neg COPD, Current Smoker and Patient abstained from smoking.,    breath sounds clear to auscultation- rhonchi (-) wheezing      Cardiovascular Exercise Tolerance: Good (-) hypertension(-) CAD, (-) Past MI, (-) Cardiac Stents and (-) CABG  Rhythm:Regular Rate:Normal - Systolic murmurs and - Diastolic murmurs    Neuro/Psych neg Seizures PSYCHIATRIC DISORDERS Anxiety Depression negative neurological ROS     GI/Hepatic negative GI ROS, Neg liver ROS,   Endo/Other  neg diabetesHypothyroidism   Renal/GU negative Renal ROS     Musculoskeletal negative musculoskeletal ROS (+)   Abdominal (+) + obese,   Peds  Hematology  (+) anemia ,   Anesthesia Other Findings Past Medical History: No date: Anxiety No date: Cancer ([place]) No date: Hashimoto's thyroiditis No date: 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 No date: History of cervical cancer No date: Hypothyroid No date: Low back pain   Reproductive/Obstetrics                             Anesthesia Physical Anesthesia Plan  ASA: II  Anesthesia Plan: General   Post-op Pain Management:    Induction: Intravenous  PONV Risk Score and Plan: 1 and Ondansetron, Dexamethasone and Midazolam  Airway Management Planned: LMA  Additional Equipment:   Intra-op Plan:   Post-operative Plan:   Informed Consent: I have reviewed the patients History and Physical, chart, labs and discussed the procedure including the risks, benefits and alternatives for  the proposed anesthesia with the patient or authorized representative who has indicated his/her understanding and acceptance.     Dental advisory given  Plan Discussed with: CRNA and Anesthesiologist  Anesthesia Plan Comments:         Anesthesia Quick Evaluation

## 2019-06-07 ENCOUNTER — Telehealth: Payer: Self-pay | Admitting: Radiology

## 2019-06-07 ENCOUNTER — Encounter: Payer: Self-pay | Admitting: Urology

## 2019-06-07 ENCOUNTER — Other Ambulatory Visit: Payer: Self-pay | Admitting: Radiology

## 2019-06-07 DIAGNOSIS — R399 Unspecified symptoms and signs involving the genitourinary system: Secondary | ICD-10-CM

## 2019-06-07 DIAGNOSIS — N3289 Other specified disorders of bladder: Secondary | ICD-10-CM

## 2019-06-07 NOTE — Telephone Encounter (Signed)
LMOM to return call. Need to reschedule surgery originally scheduled 06/06/2019 with Dr Bernardo Heater.

## 2019-06-07 NOTE — Telephone Encounter (Signed)
Patient's husband called stating patient would like to reschedule surgery. Instructions given. Questions answered. Husband verbalizes understanding.

## 2019-06-07 NOTE — Telephone Encounter (Signed)
Patient called back stating she was embarrased by the lab result and does not want to reschedule surgery at this time.

## 2019-06-08 ENCOUNTER — Other Ambulatory Visit: Payer: Self-pay | Admitting: Family Medicine

## 2019-06-08 DIAGNOSIS — N3289 Other specified disorders of bladder: Secondary | ICD-10-CM

## 2019-06-09 ENCOUNTER — Other Ambulatory Visit: Payer: Self-pay

## 2019-06-09 ENCOUNTER — Ambulatory Visit
Admission: RE | Admit: 2019-06-09 | Discharge: 2019-06-09 | Disposition: A | Discharge status: Home / Self Care | Payer: Medicaid Other | Source: Ambulatory Visit | Attending: Urology | Admitting: Urology

## 2019-06-09 ENCOUNTER — Ambulatory Visit: Payer: Medicaid Other | Admitting: Radiation Oncology

## 2019-06-09 DIAGNOSIS — R3129 Other microscopic hematuria: Secondary | ICD-10-CM | POA: Diagnosis not present

## 2019-06-09 MED ORDER — IOHEXOL 300 MG/ML  SOLN
150.0000 mL | Freq: Once | INTRAMUSCULAR | Status: AC | PRN
  Administered 2019-06-09: 150 mL via INTRAVENOUS

## 2019-06-12 ENCOUNTER — Telehealth: Payer: Self-pay

## 2019-06-12 ENCOUNTER — Other Ambulatory Visit: Payer: Self-pay

## 2019-06-12 ENCOUNTER — Other Ambulatory Visit: Payer: Medicaid Other

## 2019-06-12 DIAGNOSIS — N3289 Other specified disorders of bladder: Secondary | ICD-10-CM | POA: Diagnosis not present

## 2019-06-12 LAB — MICROSCOPIC EXAMINATION

## 2019-06-12 LAB — URINALYSIS, COMPLETE
Bilirubin, UA: NEGATIVE
Glucose, UA: NEGATIVE
Ketones, UA: NEGATIVE
Leukocytes,UA: NEGATIVE
Nitrite, UA: POSITIVE — AB
Specific Gravity, UA: >1.03 — ABNORMAL HIGH (ref 1.005–1.030)
Urobilinogen, Ur: 0.2 mg/dL (ref 0.2–1.0)
pH, UA: 5 (ref 5.0–7.5)

## 2019-06-12 NOTE — Telephone Encounter (Signed)
Called pt and informed her that CT scan showed no evidence of kidney blockage or other abnormalities. Pt verbalized understanding with no questions.  Bradly Bienenstock, CMA

## 2019-06-14 LAB — CULTURE, URINE COMPREHENSIVE

## 2019-06-15 ENCOUNTER — Other Ambulatory Visit: Payer: Self-pay | Admitting: *Deleted

## 2019-06-15 MED ORDER — OXYBUTYNIN CHLORIDE ER 10 MG PO TB24: 10.0000 mg | ORAL_TABLET | Freq: Every day | ORAL | 3 refills | Status: DC

## 2019-06-16 ENCOUNTER — Other Ambulatory Visit
Admission: RE | Admit: 2019-06-16 | Discharge: 2019-06-16 | Disposition: A | Discharge status: Home / Self Care | Payer: Medicaid Other | Source: Ambulatory Visit | Attending: Urology | Admitting: Urology

## 2019-06-16 ENCOUNTER — Ambulatory Visit: Payer: Medicaid Other | Admitting: Urology

## 2019-06-16 DIAGNOSIS — Z01812 Encounter for preprocedural laboratory examination: Secondary | ICD-10-CM | POA: Insufficient documentation

## 2019-06-16 DIAGNOSIS — Z20822 Contact with and (suspected) exposure to covid-19: Secondary | ICD-10-CM | POA: Insufficient documentation

## 2019-06-17 LAB — SARS CORONAVIRUS 2 (TAT 6-24 HRS): SARS Coronavirus 2: NEGATIVE

## 2019-06-19 MED ORDER — CIPROFLOXACIN IN D5W 400 MG/200ML IV SOLN
400.0000 mg | INTRAVENOUS | Status: AC
  Administered 2019-06-20: 16:00:00 400 mg via INTRAVENOUS

## 2019-06-20 ENCOUNTER — Ambulatory Visit
Admission: RE | Admit: 2019-06-20 | Discharge: 2019-06-20 | Disposition: A | Discharge status: Home / Self Care | Payer: Medicaid Other | Attending: Urology | Admitting: Urology

## 2019-06-20 ENCOUNTER — Ambulatory Visit: Payer: Medicaid Other

## 2019-06-20 ENCOUNTER — Encounter
Admission: RE | Disposition: A | Discharge status: Home / Self Care | Payer: Self-pay | Source: Home / Self Care | Attending: Urology

## 2019-06-20 ENCOUNTER — Encounter: Payer: Self-pay | Admitting: Urology

## 2019-06-20 ENCOUNTER — Other Ambulatory Visit: Payer: Self-pay | Admitting: *Deleted

## 2019-06-20 ENCOUNTER — Other Ambulatory Visit: Payer: Self-pay

## 2019-06-20 ENCOUNTER — Ambulatory Visit: Payer: Medicaid Other | Admitting: Anesthesiology

## 2019-06-20 DIAGNOSIS — R9341 Abnormal radiologic findings on diagnostic imaging of renal pelvis, ureter, or bladder: Secondary | ICD-10-CM | POA: Insufficient documentation

## 2019-06-20 DIAGNOSIS — Z79899 Other long term (current) drug therapy: Secondary | ICD-10-CM | POA: Insufficient documentation

## 2019-06-20 DIAGNOSIS — R3129 Other microscopic hematuria: Secondary | ICD-10-CM | POA: Diagnosis not present

## 2019-06-20 DIAGNOSIS — N3289 Other specified disorders of bladder: Secondary | ICD-10-CM | POA: Diagnosis not present

## 2019-06-20 DIAGNOSIS — Z8541 Personal history of malignant neoplasm of cervix uteri: Secondary | ICD-10-CM | POA: Diagnosis not present

## 2019-06-20 DIAGNOSIS — Z923 Personal history of irradiation: Secondary | ICD-10-CM | POA: Insufficient documentation

## 2019-06-20 DIAGNOSIS — F17210 Nicotine dependence, cigarettes, uncomplicated: Secondary | ICD-10-CM | POA: Diagnosis not present

## 2019-06-20 DIAGNOSIS — F419 Anxiety disorder, unspecified: Secondary | ICD-10-CM | POA: Diagnosis not present

## 2019-06-20 DIAGNOSIS — Z9221 Personal history of antineoplastic chemotherapy: Secondary | ICD-10-CM | POA: Diagnosis not present

## 2019-06-20 DIAGNOSIS — E063 Autoimmune thyroiditis: Secondary | ICD-10-CM | POA: Diagnosis not present

## 2019-06-20 DIAGNOSIS — F1190 Opioid use, unspecified, uncomplicated: Secondary | ICD-10-CM | POA: Insufficient documentation

## 2019-06-20 DIAGNOSIS — Z79890 Hormone replacement therapy (postmenopausal): Secondary | ICD-10-CM | POA: Insufficient documentation

## 2019-06-20 DIAGNOSIS — Z08 Encounter for follow-up examination after completed treatment for malignant neoplasm: Secondary | ICD-10-CM | POA: Insufficient documentation

## 2019-06-20 DIAGNOSIS — Z880 Allergy status to penicillin: Secondary | ICD-10-CM | POA: Insufficient documentation

## 2019-06-20 DIAGNOSIS — R609 Edema, unspecified: Secondary | ICD-10-CM | POA: Insufficient documentation

## 2019-06-20 DIAGNOSIS — L539 Erythematous condition, unspecified: Secondary | ICD-10-CM | POA: Diagnosis not present

## 2019-06-20 DIAGNOSIS — R109 Unspecified abdominal pain: Secondary | ICD-10-CM | POA: Diagnosis not present

## 2019-06-20 DIAGNOSIS — R399 Unspecified symptoms and signs involving the genitourinary system: Secondary | ICD-10-CM

## 2019-06-20 DIAGNOSIS — N3001 Acute cystitis with hematuria: Secondary | ICD-10-CM | POA: Diagnosis not present

## 2019-06-20 DIAGNOSIS — D494 Neoplasm of unspecified behavior of bladder: Secondary | ICD-10-CM | POA: Diagnosis not present

## 2019-06-20 HISTORY — PX: TRANSURETHRAL RESECTION OF BLADDER TUMOR: SHX2575

## 2019-06-20 LAB — POCT PREGNANCY, URINE: Preg Test, Ur: NEGATIVE

## 2019-06-20 LAB — URINE DRUG SCREEN, QUALITATIVE (ARMC ONLY)
Amphetamines, Ur Screen: NOT DETECTED
Barbiturates, Ur Screen: NOT DETECTED
Benzodiazepine, Ur Scrn: POSITIVE — AB
Cannabinoid 50 Ng, Ur ~~LOC~~: POSITIVE — AB
Cocaine Metabolite,Ur ~~LOC~~: NOT DETECTED
MDMA (Ecstasy)Ur Screen: NOT DETECTED
Methadone Scn, Ur: POSITIVE — AB
Opiate, Ur Screen: NOT DETECTED
Phencyclidine (PCP) Ur S: NOT DETECTED
Tricyclic, Ur Screen: NOT DETECTED

## 2019-06-20 SURGERY — TURBT (TRANSURETHRAL RESECTION OF BLADDER TUMOR)
Anesthesia: General

## 2019-06-20 MED ORDER — DEXAMETHASONE SODIUM PHOSPHATE 10 MG/ML IJ SOLN
INTRAMUSCULAR | Status: DC | PRN
  Administered 2019-06-20: 10 mg via INTRAVENOUS

## 2019-06-20 MED ORDER — SUGAMMADEX SODIUM 500 MG/5ML IV SOLN
INTRAVENOUS | Status: AC
  Filled 2019-06-20: qty 5

## 2019-06-20 MED ORDER — PROMETHAZINE HCL 25 MG/ML IJ SOLN: 6.2500 mg | INTRAMUSCULAR | Status: DC | PRN

## 2019-06-20 MED ORDER — PHENYLEPHRINE HCL (PRESSORS) 10 MG/ML IV SOLN
INTRAVENOUS | Status: DC | PRN
  Administered 2019-06-20 (×2): 100 ug via INTRAVENOUS

## 2019-06-20 MED ORDER — OXYBUTYNIN CHLORIDE ER 10 MG PO TB24: 10.0000 mg | ORAL_TABLET | Freq: Every day | ORAL | 11 refills | Status: DC

## 2019-06-20 MED ORDER — CIPROFLOXACIN IN D5W 400 MG/200ML IV SOLN
INTRAVENOUS | Status: AC
  Filled 2019-06-20: qty 200

## 2019-06-20 MED ORDER — MIDAZOLAM HCL 2 MG/2ML IJ SOLN
INTRAMUSCULAR | Status: DC | PRN
  Administered 2019-06-20: 2 mg via INTRAVENOUS

## 2019-06-20 MED ORDER — ONDANSETRON HCL 4 MG/2ML IJ SOLN: 4.0000 mg | Freq: Once | INTRAMUSCULAR | Status: AC | PRN

## 2019-06-20 MED ORDER — FAMOTIDINE 20 MG PO TABS
ORAL_TABLET | ORAL | Status: AC
  Administered 2019-06-20: 20 mg via ORAL
  Filled 2019-06-20: qty 1

## 2019-06-20 MED ORDER — LACTATED RINGERS IV SOLN: INTRAVENOUS | Status: DC

## 2019-06-20 MED ORDER — DEXMEDETOMIDINE HCL 200 MCG/2ML IV SOLN
INTRAVENOUS | Status: DC | PRN
  Administered 2019-06-20 (×2): 8 ug via INTRAVENOUS

## 2019-06-20 MED ORDER — SUGAMMADEX SODIUM 500 MG/5ML IV SOLN
INTRAVENOUS | Status: DC | PRN
  Administered 2019-06-20: 500 mg via INTRAVENOUS

## 2019-06-20 MED ORDER — ROCURONIUM BROMIDE 10 MG/ML (PF) SYRINGE
PREFILLED_SYRINGE | INTRAVENOUS | Status: AC
  Filled 2019-06-20: qty 10

## 2019-06-20 MED ORDER — KETOROLAC TROMETHAMINE 30 MG/ML IJ SOLN
INTRAMUSCULAR | Status: DC | PRN
  Administered 2019-06-20: 30 mg via INTRAVENOUS

## 2019-06-20 MED ORDER — MIDAZOLAM HCL 2 MG/2ML IJ SOLN
INTRAMUSCULAR | Status: AC
  Filled 2019-06-20: qty 2

## 2019-06-20 MED ORDER — DEXMEDETOMIDINE HCL IN NACL 80 MCG/20ML IV SOLN
INTRAVENOUS | Status: AC
  Filled 2019-06-20: qty 20

## 2019-06-20 MED ORDER — ONDANSETRON HCL 4 MG/2ML IJ SOLN
INTRAMUSCULAR | Status: AC
  Administered 2019-06-20: 4 mg via INTRAVENOUS
  Filled 2019-06-20: qty 2

## 2019-06-20 MED ORDER — KETOROLAC TROMETHAMINE 10 MG PO TABS: 10.0000 mg | ORAL_TABLET | Freq: Four times a day (QID) | ORAL | 0 refills | Status: DC | PRN

## 2019-06-20 MED ORDER — ACETAMINOPHEN 10 MG/ML IV SOLN
INTRAVENOUS | Status: DC | PRN
  Administered 2019-06-20: 1000 mg via INTRAVENOUS

## 2019-06-20 MED ORDER — ACETAMINOPHEN 10 MG/ML IV SOLN
INTRAVENOUS | Status: AC
  Filled 2019-06-20: qty 100

## 2019-06-20 MED ORDER — ROCURONIUM BROMIDE 100 MG/10ML IV SOLN
INTRAVENOUS | Status: DC | PRN
  Administered 2019-06-20: 70 mg via INTRAVENOUS

## 2019-06-20 MED ORDER — DEXAMETHASONE SODIUM PHOSPHATE 10 MG/ML IJ SOLN
INTRAMUSCULAR | Status: AC
  Filled 2019-06-20: qty 1

## 2019-06-20 MED ORDER — OXYBUTYNIN CHLORIDE ER 10 MG PO TB24: 10.0000 mg | ORAL_TABLET | Freq: Every day | ORAL | 3 refills | Status: DC

## 2019-06-20 MED ORDER — ONDANSETRON HCL 4 MG/2ML IJ SOLN
INTRAMUSCULAR | Status: DC | PRN
  Administered 2019-06-20: 4 mg via INTRAVENOUS

## 2019-06-20 MED ORDER — OXYBUTYNIN CHLORIDE 5 MG PO TABS
ORAL_TABLET | ORAL | Status: AC
  Administered 2019-06-20: 10 mg via ORAL
  Filled 2019-06-20: qty 1

## 2019-06-20 MED ORDER — PROPOFOL 10 MG/ML IV BOLUS
INTRAVENOUS | Status: DC | PRN
  Administered 2019-06-20: 200 mg via INTRAVENOUS

## 2019-06-20 MED ORDER — ONDANSETRON HCL 4 MG/2ML IJ SOLN
INTRAMUSCULAR | Status: AC
  Filled 2019-06-20: qty 2

## 2019-06-20 MED ORDER — OXYBUTYNIN CHLORIDE 5 MG PO TABS: 10.0000 mg | ORAL_TABLET | Freq: Once | ORAL | Status: AC

## 2019-06-20 MED ORDER — KETOROLAC TROMETHAMINE 30 MG/ML IJ SOLN
INTRAMUSCULAR | Status: AC
  Filled 2019-06-20: qty 1

## 2019-06-20 MED ORDER — OXYBUTYNIN CHLORIDE 5 MG PO TABS
ORAL_TABLET | ORAL | Status: AC
  Filled 2019-06-20: qty 1

## 2019-06-20 MED ORDER — FAMOTIDINE 20 MG PO TABS: 20.0000 mg | ORAL_TABLET | Freq: Once | ORAL | Status: AC

## 2019-06-20 MED ORDER — LIDOCAINE HCL (CARDIAC) PF 100 MG/5ML IV SOSY
PREFILLED_SYRINGE | INTRAVENOUS | Status: DC | PRN
  Administered 2019-06-20: 100 mg via INTRAVENOUS

## 2019-06-20 SURGICAL SUPPLY — 30 items
BAG DRAIN CYSTO-URO LG1000N (MISCELLANEOUS) ×4 IMPLANT
BAG URINE DRAIN 2000ML AR STRL (UROLOGICAL SUPPLIES) ×2 IMPLANT
BRUSH SCRUB EZ  4% CHG (MISCELLANEOUS) ×2
BRUSH SCRUB EZ 4% CHG (MISCELLANEOUS) ×2 IMPLANT
CATH FOLEY 2WAY 18X30 (CATHETERS) IMPLANT
CATH FOLEY 2WAY SIL 18X30 (CATHETERS)
CATH URETL 5X70 OPEN END (CATHETERS) ×2 IMPLANT
DRAPE UTILITY 15X26 TOWEL STRL (DRAPES) ×4 IMPLANT
DRSG TELFA 4X3 1S NADH ST (GAUZE/BANDAGES/DRESSINGS) ×4 IMPLANT
ELECT LOOP 22F BIPOLAR SML (ELECTROSURGICAL) ×4
ELECT REM PT RETURN 9FT ADLT (ELECTROSURGICAL)
ELECTRODE LOOP 22F BIPOLAR SML (ELECTROSURGICAL) IMPLANT
ELECTRODE REM PT RTRN 9FT ADLT (ELECTROSURGICAL) IMPLANT
GLOVE BIO SURGEON STRL SZ8 (GLOVE) ×4 IMPLANT
GOWN STRL REUS W/ TWL LRG LVL3 (GOWN DISPOSABLE) ×2 IMPLANT
GOWN STRL REUS W/TWL LRG LVL3 (GOWN DISPOSABLE) ×2
GOWN STRL REUS W/TWL XL LVL3 (GOWN DISPOSABLE) ×4 IMPLANT
GOWN STRL REUS W/TWL XL LVL4 (GOWN DISPOSABLE) ×4 IMPLANT
GUIDEWIRE STR DUAL SENSOR (WIRE) ×2 IMPLANT
IV NS IRRIG 3000ML ARTHROMATIC (IV SOLUTION) ×8 IMPLANT
KIT TURNOVER CYSTO (KITS) ×4 IMPLANT
LOOP CUT BIPOLAR 24F LRG (ELECTROSURGICAL) IMPLANT
PACK CYSTO AR (MISCELLANEOUS) ×4 IMPLANT
SET CYSTO W/LG BORE CLAMP LF (SET/KITS/TRAYS/PACK) ×4 IMPLANT
SET IRRIG Y TYPE TUR BLADDER L (SET/KITS/TRAYS/PACK) ×4 IMPLANT
SOL .9 NS 3000ML IRR  AL (IV SOLUTION)
SOL .9 NS 3000ML IRR UROMATIC (IV SOLUTION) ×2 IMPLANT
SURGILUBE 2OZ TUBE FLIPTOP (MISCELLANEOUS) ×4 IMPLANT
SYRINGE IRR TOOMEY STRL 70CC (SYRINGE) ×4 IMPLANT
WATER STERILE IRR 1000ML POUR (IV SOLUTION) ×4 IMPLANT

## 2019-06-20 NOTE — H&P (Signed)
@ENCDATE @ 3:43 PM   Kristy Gordon 1985-09-16 [id number]  Referring provider: No referring provider defined for this encounter.  No chief complaint on file.   HPI: 34 yo female initially seen 04/17/2019 with microhematuria and storage related voiding symptoms.  History T2N1 adenocarcinoma cervix treated with cis-platinum, external beam radiation and vaginal brachytherapy.  No improvement in her symptoms with combination oxybutynin/Myrbetriq.  Cystoscopy performed on 05/19/2019 showed erythematous, edematous bladder mucosa at the left lateral wall and bladder base.  The ureteral orifice was not visualized.  She had noted intermittent left flank pain however CT urogram 06/09/2019 showed no left hydronephrosis/hydroureter.  She presents for bladder biopsy/fulguration with possible TURBT.  PMH: Past Medical History:  Diagnosis Date  . Anxiety   . Cervical cancer ([place]) 03/2018  . Hashimoto's thyroiditis   . 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 History of cervical cancer   . Hypothyroid   . Low back pain     Surgical History: Past Surgical History:  Procedure Laterality Date  . BUNIONECTOMY Right   . PORTA CATH INSERTION N/A 03/31/2018   Procedure: PORTA CATH INSERTION;  Surgeon: Algernon Huxley, MD;  Location: [place] [facility];  Service: Cardiovascular;  Laterality: N/A;  . TONSILLECTOMY      Home Medications:  .  ALPRAZolam (XANAX) 1 MG tablet, TAKE ONE TABLET BY MOUTH 3 TIMES DAILY AS NEEDED (Patient taking differently: Take 1 mg by mouth 3 (three) times daily as needed for anxiety. ), Disp: 90 tablet, Rfl: 3 .  amphetamine-dextroamphetamine (ADDERALL) 10 MG tablet, Take 1 tablet (10 mg total) by mouth daily as needed., Disp: 60 tablet, Rfl: 0 .  [START ON 08/28/2019] amphetamine-dextroamphetamine (ADDERALL) 10 MG tablet, Take 1 tablet (10 mg total) by mouth daily as needed., Disp: 60 tablet, Rfl: 0 .  [START ON 10/28/2019] amphetamine-dextroamphetamine (ADDERALL) 10 MG  tablet, Take 1 tablet (10 mg total) by mouth daily as needed., Disp: 60 tablet, Rfl: 0 .  DULoxetine (CYMBALTA) 30 MG capsule, 2 capsules daily (Patient taking differently: Take 60 mg by mouth daily. 2 capsules dail), Disp: 60 capsule, Rfl: 3 .  estradiol (VIVELLE-DOT) 0.1 MG/24HR patch, Place 1 patch (0.1 mg total) onto the skin 2 (two) times a week., Disp: 8 patch, Rfl: 12 .  gabapentin (NEURONTIN) 300 MG capsule, TAKE 1 CAPSULE BY MOUTH THREE TIMES DAILY (Patient taking differently: Take 300 mg by mouth 3 (three) times daily. ), Disp: 90 capsule, Rfl: 0 .  levothyroxine (SYNTHROID) 175 MCG tablet, Take 1 tablet (175 mcg total) by mouth daily before breakfast., Disp: 90 tablet, Rfl: 1 .  METHADONE HCL PO, Take 15 mg by mouth daily at 6 (six) AM. , Disp: , Rfl:  .  ondansetron (ZOFRAN) 8 MG tablet, Take 8 mg by mouth every 8 (eight) hours as needed for nausea or vomiting., Disp: , Rfl:  .  oxybutynin (DITROPAN XL) 10 MG 24 hr tablet, Take 1 tablet (10 mg total) by mouth at bedtime., Disp: 30 tablet, Rfl: 3 .  progesterone (PROMETRIUM) 200 MG capsule, Take 200 mg by mouth at bedtime with food for 12 days sequentially per 28 day cycle., Disp: 12 capsule, Rfl: 11 .  promethazine (PHENERGAN) 25 MG tablet, Take 12.5-25 mg by mouth every 12 (twelve) hours as needed for nausea or vomiting. , Disp: , Rfl:  .  lidocaine-prilocaine (EMLA) cream, Apply to affected area once (Patient not taking: Reported on 06/02/2019), Disp: 30 g, Rfl: 3  Allergies:  Allergies  Allergen Reactions  . Amoxicillin Rash    Did it involve swelling of the face/tongue/throat, SOB, or low BP? No Did it involve sudden or severe rash/hives, skin peeling, or any reaction on the inside of your mouth or nose? No Did you need to seek medical attention at a hospital or doctor's office? No When did it last happen?Childhood If all above answers are "NO", may proceed with cephalosporin use.   Marland Kitchen Penicillins Hives    Did it involve  swelling of the face/tongue/throat, SOB, or low BP? No Did it involve sudden or severe rash/hives, skin peeling, or any reaction on the inside of your mouth or nose? No Did you need to seek medical attention at a hospital or doctor's office? No When did it last happen?Childhood If all above answers are "NO", may proceed with cephalosporin use.     Family History: Family History  Adopted: Yes  Family history unknown: Yes    Social History:  reports that she has been smoking cigarettes. She has a 1.50 pack-year smoking history. She has never used smokeless tobacco. She reports current alcohol use. She reports current drug use. Drug: Marijuana.   Physical Exam: BP 120/78   Pulse (!) 108   Temp 98.7 F (37.1 C) (Oral)   Resp 16   Ht 5\' 7"  (1.702 m)   Wt 127 kg   LMP  (LMP Unknown)   SpO2 98%   BMI 43.85 kg/m   Constitutional:  Alert and oriented, No acute distress. HEENT: [place] AT, moist mucus membranes.  Trachea midline, no masses. Cardiovascular: No clubbing, cyanosis, or edema.  RRR Respiratory: Normal respiratory effort, no increased work of breathing.  Clear GI: Abdomen is soft, nontender, nondistended, no abdominal masses GU: No CVA tenderness Lymph: No cervical or inguinal lymphadenopathy. Skin: No rashes, bruises or suspicious lesions. Neurologic: Grossly intact, no focal deficits, moving all 4 extremities. Psychiatric: Normal mood and affect.   Assessment & Plan:    Related voiding symptoms with erythematous/edematous bladder mucosa.  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  She presents for cystoscopy under anesthesia with bladder biopsy/fulguration, possible TURBT.  The procedure was cussed in detail potential risk of bleeding, infection and bladder injury.  All questions were answered and she desires to proceed.   [name], [place] [address] [place], [place] [address] [phone]

## 2019-06-20 NOTE — Op Note (Signed)
Preoperative diagnosis:  1. Abnormal bladder mucosa 2. Lower urinary tract symptoms 3. Cervical cancer  Postoperative diagnosis:  1. Same  Procedure: 1. Transurethral resection/fulguration of abnormal bladder mucosa (small)  Surgeon: [name], MD  Anesthesia: General  Complications: None  Intraoperative findings:  1.  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  Erythematous bladder mucosa   Indication: Kristy Gordon is a 34 y.o. female with T2N1 adenocarcinoma cervix treated with cis-platinum, external beam radiation and vaginal brachytherapy.  She was seen for storage elated voiding symptoms and microhematuria.  Office cystoscopy showed erythematous and edematous bladder mucosa.  CT urogram showed no upper tract abnormalities or hydronephrosis.  After reviewing the management options for treatment, she elected to proceed with the above surgical procedure(s). We have discussed the potential benefits and risks of the procedure, side effects of the proposed treatment, the likelihood of the patient achieving the goals of the procedure, and any potential problems that might occur during the procedure or recuperation. Informed consent has been obtained.  Description of procedure:  The patient was taken to the operating room and general anesthesia was induced.  The patient was placed in the dorsal lithotomy position, prepped and draped in the usual sterile fashion, and preoperative antibiotics were administered. A preoperative time-out was performed.   A 24 French resectoscope sheath with obturator was lubricated and passed per urethra.  The [facility] resectoscope with loop was then placed in the sheath.  Panendoscopy was performed with findings as described above.  The ureteral orifices were identified bilaterally and demonstrated clear efflux.  Small areas of the abnormal  appearing mucosa were resected at the posterior wall and trigone and were collected and sent for pathologic examination.  The abnormal mucosa was then fulgurated.  At the completion of procedure hemostasis was adequate.  The ureteral orifice ease were normal-appearing with clear efflux.  The bladder was emptied and the resectoscope was removed.  Plan: She will be notified with her pathology results   [name], M.D.

## 2019-06-20 NOTE — Discharge Instructions (Signed)
Transurethral Resection of Bladder Tumor (TURBT) or Bladder Biopsy   Definition:  Transurethral Resection of the Bladder Tumor is a surgical procedure used to diagnose and remove tumors or abnormalities within the lining of the bladder.   General instructions:     Your recent bladder surgery requires very little post hospital care but some definite precautions.  Despite the fact that no skin incisions were used, the area around the bladder incisions are raw and covered with scabs to promote healing and prevent bleeding. Certain precautions are needed to insure that the scabs are not disturbed over the next 2-4 weeks while the healing proceeds.  Because the raw surface inside your bladder and the irritating effects of urine you may expect frequency of urination and/or urgency (a stronger desire to urinate) and perhaps even getting up at night more often. This will usually resolve or improve slowly over the healing period. You may see some blood in your urine over the first 6 weeks. Do not be alarmed, even if the urine was clear for a while. Get off your feet and drink lots of fluids until clearing occurs. If you start to pass clots or don't improve call us.  Diet:  You may return to your normal diet immediately. Because of the raw surface of your bladder, alcohol, spicy foods, foods high in acid and drinks with caffeine may cause irritation or frequency and should be used in moderation. To keep your urine flowing freely and avoid constipation, drink plenty of fluids during the day (8-10 glasses). Tip: Avoid cranberry juice because it is very acidic.  Activity:  Your physical activity doesn't need to be restricted. However, if you are very active, you may see some blood in the urine. We suggest that you reduce your activity under the circumstances until the bleeding has stopped.  Bowels:  It is important to keep your bowels regular during the postoperative period. Straining with bowel movements  can cause bleeding. A bowel movement every other day is reasonable. Use a mild laxative if needed, such as milk of magnesia 2-3 tablespoons, or 2 Dulcolax tablets. Call if you continue to have problems. If you had been taking narcotics for pain, before, during or after your surgery, you may be constipated. Take a laxative if necessary.    Medication:  You should resume your pre-surgery medications unless told not to. In addition you may be given an antibiotic to prevent or treat infection. Antibiotics are not always necessary. All medication should be taken as prescribed until the bottles are finished unless you are having an unusual reaction to one of the drugs.  Refill oxybutynin was sent to your pharmacy Rx Toradol was sent to your pharmacy  [facility] [address], [place], [place] [address] [phone]  AMBULATORY SURGERY  DISCHARGE INSTRUCTIONS   1) The drugs that you were given will stay in your system until tomorrow so for the next 24 hours you should not:  A) Drive an automobile B) Make any legal decisions C) Drink any alcoholic beverage   2) You may resume regular meals tomorrow.  Today it is better to start with liquids and gradually work up to solid foods.  You may eat anything you prefer, but it is better to start with liquids, then soup and crackers, and gradually work up to solid foods.   3) Please notify your doctor immediately if you have any unusual bleeding, trouble breathing, redness and pain at the surgery site, drainage, fever, or pain  not relieved by medication.    4) Additional Instructions:    Please contact your physician with any problems or Same Day Surgery at [phone], Monday through Friday 6 am to 4 pm, or [place] at [facility] number at [phone].AMBULATORY

## 2019-06-20 NOTE — Anesthesia Postprocedure Evaluation (Signed)
Anesthesia Post Note  Patient: Kristy Gordon  Procedure(s) Performed: TRANSURETHRAL RESECTION OF BLADDER TUMOR (TURBT) (N/A )  Patient location during evaluation: PACU Anesthesia Type: General Level of consciousness: awake and alert Pain management: pain level controlled Vital Signs Assessment: post-procedure vital signs reviewed and stable Respiratory status: spontaneous breathing, nonlabored ventilation and respiratory function stable Cardiovascular status: blood pressure returned to baseline and stable Postop Assessment: no apparent nausea or vomiting Anesthetic complications: no     Last Vitals:  Vitals:   06/20/19 1713 06/20/19 1729  BP: (!) 130/95 (!) 111/94  Pulse: 81 84  Resp: 17 18  Temp: (!) 36.1 C (!) 36.1 C  SpO2: 94% 95%    Last Pain:  Vitals:   06/20/19 1729  TempSrc: Temporal  PainSc: [place]

## 2019-06-20 NOTE — OR Nursing (Signed)
Patient is in pain but talking and drinking a drink she is refusing all narcotics because she takes methadone, going to get patient up to toilet

## 2019-06-20 NOTE — Anesthesia Procedure Notes (Signed)
Procedure Name: Intubation Date/Time: 06/20/2019 4:03 PM Performed by: Aline Brochure, CRNA Pre-anesthesia Checklist: Patient identified, Emergency Drugs available, Suction available and Patient being monitored Patient Re-evaluated:Patient Re-evaluated prior to induction Oxygen Delivery Method: Circle system utilized Preoxygenation: Pre-oxygenation with 100% oxygen Induction Type: IV induction Ventilation: Mask ventilation without difficulty Laryngoscope Size: McGraph and 3 Grade View: Grade I Tube type: Oral Tube size: 7.0 mm Number of attempts: 1 Airway Equipment and Method: Stylet and Video-laryngoscopy Placement Confirmation: ETT inserted through vocal cords under direct vision,  positive ETCO2 and breath sounds checked- equal and bilateral Secured at: 22 cm Tube secured with: Tape Dental Injury: Teeth and Oropharynx as per pre-operative assessment

## 2019-06-20 NOTE — Anesthesia Preprocedure Evaluation (Signed)
Anesthesia Evaluation  Patient identified by MRN, date of birth, ID band Patient awake    Reviewed: Allergy & Precautions, NPO status , Patient's Chart, lab work & pertinent test results  History of Anesthesia Complications Negative for: history of anesthetic complications  Airway Mallampati: II  TM Distance: >3 FB Neck ROM: Full    Dental no notable dental hx.    Pulmonary neg sleep apnea, neg COPD, Current Smoker and Patient abstained from smoking.,    breath sounds clear to auscultation- rhonchi (-) wheezing      Cardiovascular Exercise Tolerance: Good (-) hypertension(-) CAD, (-) Past MI, (-) Cardiac Stents and (-) CABG  Rhythm:Regular Rate:Normal - Systolic murmurs and - Diastolic murmurs    Neuro/Psych neg Seizures PSYCHIATRIC DISORDERS Anxiety Depression negative neurological ROS     GI/Hepatic negative GI ROS, Neg liver ROS,   Endo/Other  neg diabetesHypothyroidism   Renal/GU negative Renal ROS     Musculoskeletal negative musculoskeletal ROS (+)   Abdominal (+) + obese,   Peds  Hematology  (+) anemia ,   Anesthesia Other Findings Past Medical History: No date: Anxiety 03/2018: Cervical cancer (HCC) No date: Hashimoto's thyroiditis No date: 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 No date: History of cervical cancer No date: Hypothyroid No date: Low back pain   Reproductive/Obstetrics                             Anesthesia Physical Anesthesia Plan  ASA: II  Anesthesia Plan: General   Post-op Pain Management:    Induction: Intravenous  PONV Risk Score and Plan: 1 and Ondansetron, Dexamethasone and Midazolam  Airway Management Planned: Oral ETT  Additional Equipment:   Intra-op Plan:   Post-operative Plan: Extubation in OR  Informed Consent: I have reviewed the patients History and Physical, chart, labs and discussed the procedure including the risks,  benefits and alternatives for the proposed anesthesia with the patient or authorized representative who has indicated his/her understanding and acceptance.     Dental advisory given  Plan Discussed with: CRNA and Anesthesiologist  Anesthesia Plan Comments:         Anesthesia Quick Evaluation

## 2019-06-20 NOTE — Interval H&P Note (Signed)
History and Physical Interval Note:  06/20/2019 3:49 PM  Kristy Gordon  has presented today for surgery, with the diagnosis of Bladder erythema, abnormal cystoscopy.  The various methods of treatment have been discussed with the patient and family. After consideration of risks, benefits and other options for treatment, the patient has consented to  Procedure(s): TRANSURETHRAL RESECTION OF BLADDER TUMOR (TURBT) (N/A) POSSIBLE CYSTOSCOPY WITH RETROGRADE PYELOGRAM (Left) as a surgical intervention.  The patient's history has been reviewed, patient examined, no change in status, stable for surgery.  I have reviewed the patient's chart and labs.  Questions were answered to the patient's satisfaction.     [place]

## 2019-06-20 NOTE — Transfer of Care (Signed)
Immediate Anesthesia Transfer of Care Note  Patient: Kristy Gordon  Procedure(s) Performed: TRANSURETHRAL RESECTION OF BLADDER TUMOR (TURBT) (N/A )  Patient Location: PACU  Anesthesia Type:General  Level of Consciousness: awake, alert  and oriented  Airway & Oxygen Therapy: Patient connected to face mask oxygen  Post-op Assessment: Post -op Vital signs reviewed and stable  Post vital signs: stable  Last Vitals:  Vitals Value Taken Time  BP 125/67   Temp    Pulse 77 06/20/19 1647  Resp    SpO2 95 % 06/20/19 1647  Vitals shown include unvalidated device data.  Last Pain:  Vitals:   06/20/19 1330  TempSrc: Oral  PainSc: 3          Complications: No apparent anesthesia complications

## 2019-06-22 ENCOUNTER — Ambulatory Visit: Payer: Medicaid Other | Admitting: Urology

## 2019-06-22 LAB — SURGICAL PATHOLOGY

## 2019-06-23 ENCOUNTER — Telehealth: Payer: Self-pay | Admitting: *Deleted

## 2019-06-23 NOTE — Telephone Encounter (Signed)
Notified patient as instructed, patient pleased. Discussed follow-up appointments, patient agrees  

## 2019-06-23 NOTE — Telephone Encounter (Signed)
-----   Message from [name], MD sent at 06/22/2019 10:45 PM EDT ----- Bladder biopsies showed inflammation/ulceration consistent with radiation changes.  No evidence of malignancy.  Follow-up as scheduled

## 2019-06-29 NOTE — Progress Notes (Signed)
06/30/19 1:37 PM   Kristy Gordon 1985/10/03 MD:[id number]  Referring provider: [name], MD [address] [place],   [address]  Chief Complaint  Patient presents with  . Follow-up    HPI: 34 yo white F returns today for f/u s/p bladder bx and TURBT   -Hx of T2 N1 M0 adenocarcinoma of cervix treated with cis-platinum, radiation and vaginal brachytherapy  -Mild improvement in symptoms with combination of Myrbetriq/oxybutynin  -Cystoscopy on 05/19/19 marked abnormal urothelium left lateral wall/base -CT on 06/09/19 showed no evidence of kidney blockage or other abnormalities  -s/p bladder bx and TURBT on 06/20/19 -bladder bxs showed inflammation/ulceration consistent with radiation changes, no definite evidence of malignancy  -reports of nausea and vomiting x2 days after procedure  -urinary symptoms improved briefly however back to previous symptoms  -Currently on oxybutynin 10 mg   PMH: Past Medical History:  Diagnosis Date  . Anxiety   . Cervical cancer ([place]) 03/2018  . Hashimoto's thyroiditis   . 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 History of cervical cancer   . Hypothyroid   . Low back pain     Surgical History: Past Surgical History:  Procedure Laterality Date  . BUNIONECTOMY Right   . PORTA CATH INSERTION N/A 03/31/2018   Procedure: PORTA CATH INSERTION;  Surgeon: Algernon Huxley, MD;  Location: [facility];  Service: Cardiovascular;  Laterality: N/A;  . TONSILLECTOMY    . TRANSURETHRAL RESECTION OF BLADDER TUMOR N/A 06/20/2019   Procedure: TRANSURETHRAL RESECTION OF BLADDER TUMOR (TURBT);  Surgeon: [name], MD;  Location: [facility];  Service: Urology;  Laterality: N/A;    Home Medications:  Allergies as of 06/30/2019      Reactions   Amoxicillin Rash   Did it involve swelling of the face/tongue/throat, SOB, or low BP? No Did it involve sudden or severe rash/hives, skin peeling, or any reaction on the inside of  your mouth or nose? No Did you need to seek medical attention at a hospital or doctor's office? No When did it last happen?Childhood If all above answers are "NO", may proceed with cephalosporin use.   Penicillins Hives   Did it involve swelling of the face/tongue/throat, SOB, or low BP? No Did it involve sudden or severe rash/hives, skin peeling, or any reaction on the inside of your mouth or nose? No Did you need to seek medical attention at a hospital or doctor's office? No When did it last happen?Childhood If all above answers are "NO", may proceed with cephalosporin use.      Medication List       Accurate as of June 30, 2019  1:37 PM. If you have any questions, ask your nurse or doctor.        ALPRAZolam 1 MG tablet Commonly known as: XANAX TAKE ONE TABLET BY MOUTH 3 TIMES DAILY AS NEEDED What changed: See the new instructions.   amphetamine-dextroamphetamine 10 MG tablet Commonly known as: Adderall Take 1 tablet (10 mg total) by mouth daily as needed.   amphetamine-dextroamphetamine 10 MG tablet Commonly known as: Adderall Take 1 tablet (10 mg total) by mouth daily as needed. Start taking on: Aug 28, 2019   amphetamine-dextroamphetamine 10 MG tablet Commonly known as: Adderall Take 1 tablet (10 mg total) by mouth daily as needed. Start taking on: October 28, 2019   DULoxetine 30 MG capsule Commonly known as: CYMBALTA 2 capsules daily What changed:  how much to take  how to take this  when to take this  additional instructions   estradiol 0.1 MG/24HR patch Commonly known as: Vivelle-Dot Place 1 patch (0.1 mg total) onto the skin 2 (two) times a week.   gabapentin 300 MG capsule Commonly known as: NEURONTIN TAKE 1 CAPSULE BY MOUTH THREE TIMES DAILY   ketorolac 10 MG tablet Commonly known as: TORADOL Take 1 tablet (10 mg total) by mouth every 6 (six) hours as needed.   levothyroxine 175 MCG tablet Commonly known as: SYNTHROID Take 1  tablet (175 mcg total) by mouth daily before breakfast.   lidocaine-prilocaine cream Commonly known as: EMLA Apply to affected area once   METHADONE HCL PO Take 15 mg by mouth daily at 6 (six) AM.   ondansetron 8 MG tablet Commonly known as: ZOFRAN Take 8 mg by mouth every 8 (eight) hours as needed for nausea or vomiting.   oxybutynin 10 MG 24 hr tablet Commonly known as: Ditropan XL Take 1 tablet (10 mg total) by mouth at bedtime.   progesterone 200 MG capsule Commonly known as: Prometrium Take 200 mg by mouth at bedtime with food for 12 days sequentially per 28 day cycle.   promethazine 25 MG tablet Commonly known as: PHENERGAN Take 12.5-25 mg by mouth every 12 (twelve) hours as needed for nausea or vomiting.       Allergies:  Allergies  Allergen Reactions  . Amoxicillin Rash    Did it involve swelling of the face/tongue/throat, SOB, or low BP? No Did it involve sudden or severe rash/hives, skin peeling, or any reaction on the inside of your mouth or nose? No Did you need to seek medical attention at a hospital or doctor's office? No When did it last happen?Childhood If all above answers are "NO", may proceed with cephalosporin use.   Marland Kitchen Penicillins Hives    Did it involve swelling of the face/tongue/throat, SOB, or low BP? No Did it involve sudden or severe rash/hives, skin peeling, or any reaction on the inside of your mouth or nose? No Did you need to seek medical attention at a hospital or doctor's office? No When did it last happen?Childhood If all above answers are "NO", may proceed with cephalosporin use.     Family History: Family History  Adopted: Yes  Family history unknown: Yes    Social History:  reports that she has been smoking cigarettes. She has a 1.50 pack-year smoking history. She has never used smokeless tobacco. She reports current alcohol use. She reports current drug use. Drug: Marijuana.   Physical Exam: BP 138/74   Pulse  72   Ht 5\' 7"  (1.702 m)   Wt 280 lb (127 kg)   LMP  (LMP Unknown)   BMI 43.85 kg/m   Constitutional:  Alert and oriented, No acute distress. HEENT: [place] AT, moist mucus membranes.  Trachea midline, no masses. Cardiovascular: No clubbing, cyanosis, or edema. Respiratory: Normal respiratory effort, no increased work of breathing. Skin: No rashes, bruises or suspicious lesions. Neurologic: Grossly intact, no focal deficits, moving all 4 extremities. Psychiatric: Normal mood and affect.  Laboratory Data: Lab Results  Component Value Date   WBC 7.1 06/02/2019   HGB 13.1 06/02/2019   HCT 38.0 06/02/2019   MCV 92.9 06/02/2019   PLT 378 06/02/2019    Lab Results  Component Value Date   CREATININE 0.76 06/02/2019   Assessment & Plan:    1. Lower urinary tract symptoms  Most likely secondary to  radiation cystitis  Gave her Myrbetriq 50 mg samples to take in conjunction with oxybutynin  Will if Dr. Matilde Sprang has any recommendation regarding management   [name], MD  [facility] [address] [place], [place] [address] [phone]  I, Lucas Mallow, am acting as a scribe for Dr. Nicki Reaper C. Keniah Klemmer,  I have reviewed the above documentation for accuracy and completeness, and I agree with the above.   [name], MD

## 2019-06-30 ENCOUNTER — Encounter: Payer: Self-pay | Admitting: Urology

## 2019-06-30 ENCOUNTER — Other Ambulatory Visit: Payer: Self-pay

## 2019-06-30 ENCOUNTER — Ambulatory Visit (INDEPENDENT_AMBULATORY_CARE_PROVIDER_SITE_OTHER): Payer: Medicaid Other | Admitting: Urology

## 2019-06-30 VITALS — BP 138/74 | HR 72 | Ht 67.0 in | Wt 280.0 lb

## 2019-06-30 DIAGNOSIS — N3040 Irradiation cystitis without hematuria: Secondary | ICD-10-CM | POA: Diagnosis not present

## 2019-07-13 ENCOUNTER — Encounter: Payer: Self-pay | Admitting: Urology

## 2019-07-14 ENCOUNTER — Other Ambulatory Visit: Payer: Self-pay | Admitting: *Deleted

## 2019-07-14 ENCOUNTER — Other Ambulatory Visit: Payer: Self-pay | Admitting: Urology

## 2019-07-14 ENCOUNTER — Encounter: Payer: Self-pay | Admitting: Radiation Oncology

## 2019-07-14 MED ORDER — DEXAMETHASONE 2 MG PO TABS: 2.0000 mg | ORAL_TABLET | Freq: Every day | ORAL | 0 refills | Status: DC

## 2019-07-14 MED ORDER — LANSOPRAZOLE 30 MG PO CPDR: 30.0000 mg | DELAYED_RELEASE_CAPSULE | Freq: Every day | ORAL | 0 refills | Status: DC

## 2019-07-14 MED ORDER — OXYBUTYNIN CHLORIDE ER 15 MG PO TB24: 15.0000 mg | ORAL_TABLET | Freq: Every day | ORAL | 3 refills | Status: DC

## 2019-07-15 ENCOUNTER — Other Ambulatory Visit: Payer: Self-pay | Admitting: Urology

## 2019-07-15 ENCOUNTER — Other Ambulatory Visit: Payer: Self-pay | Admitting: Oncology

## 2019-08-14 ENCOUNTER — Other Ambulatory Visit: Payer: Self-pay | Admitting: Radiation Oncology

## 2019-08-23 ENCOUNTER — Encounter: Payer: Self-pay | Admitting: Oncology

## 2019-08-25 ENCOUNTER — Telehealth: Payer: Self-pay | Admitting: *Deleted

## 2019-08-25 ENCOUNTER — Inpatient Hospital Stay: Payer: Medicaid Other | Attending: Oncology

## 2019-08-25 ENCOUNTER — Encounter: Payer: Self-pay | Admitting: Oncology

## 2019-08-25 ENCOUNTER — Other Ambulatory Visit: Payer: Self-pay

## 2019-08-25 ENCOUNTER — Other Ambulatory Visit: Payer: Self-pay | Admitting: Oncology

## 2019-08-25 ENCOUNTER — Other Ambulatory Visit: Payer: Self-pay | Admitting: *Deleted

## 2019-08-25 ENCOUNTER — Inpatient Hospital Stay: Payer: Medicaid Other

## 2019-08-25 ENCOUNTER — Inpatient Hospital Stay (HOSPITAL_BASED_OUTPATIENT_CLINIC_OR_DEPARTMENT_OTHER): Payer: Medicaid Other | Admitting: Oncology

## 2019-08-25 DIAGNOSIS — Z8541 Personal history of malignant neoplasm of cervix uteri: Secondary | ICD-10-CM

## 2019-08-25 DIAGNOSIS — R112 Nausea with vomiting, unspecified: Secondary | ICD-10-CM | POA: Diagnosis not present

## 2019-08-25 DIAGNOSIS — N3040 Irradiation cystitis without hematuria: Secondary | ICD-10-CM | POA: Insufficient documentation

## 2019-08-25 DIAGNOSIS — C775 Secondary and unspecified malignant neoplasm of intrapelvic lymph nodes: Secondary | ICD-10-CM | POA: Diagnosis not present

## 2019-08-25 DIAGNOSIS — E876 Hypokalemia: Secondary | ICD-10-CM | POA: Diagnosis not present

## 2019-08-25 DIAGNOSIS — C539 Malignant neoplasm of cervix uteri, unspecified: Secondary | ICD-10-CM

## 2019-08-25 DIAGNOSIS — F17210 Nicotine dependence, cigarettes, uncomplicated: Secondary | ICD-10-CM | POA: Diagnosis not present

## 2019-08-25 DIAGNOSIS — R300 Dysuria: Secondary | ICD-10-CM

## 2019-08-25 LAB — URINALYSIS, COMPLETE (UACMP) WITH MICROSCOPIC
Bilirubin Urine: NEGATIVE
Glucose, UA: NEGATIVE mg/dL
Ketones, ur: NEGATIVE mg/dL
Nitrite: NEGATIVE
Protein, ur: 100 mg/dL — AB
Specific Gravity, Urine: 1.015 (ref 1.005–1.030)
pH: 6 (ref 5.0–8.0)

## 2019-08-25 LAB — COMPREHENSIVE METABOLIC PANEL
ALT: 32 U/L (ref 0–44)
AST: 31 U/L (ref 15–41)
Albumin: 4.3 g/dL (ref 3.5–5.0)
Alkaline Phosphatase: 75 U/L (ref 38–126)
Anion gap: 10 (ref 5–15)
BUN: 8 mg/dL (ref 6–20)
CO2: 23 mmol/L (ref 22–32)
Calcium: 8.9 mg/dL (ref 8.9–10.3)
Chloride: 102 mmol/L (ref 98–111)
Creatinine, Ser: 0.82 mg/dL (ref 0.44–1.00)
GFR calc Af Amer: >60 mL/min (ref >60)
GFR calc non Af Amer: >60 mL/min (ref >60)
Glucose, Bld: 123 mg/dL — ABNORMAL HIGH (ref 70–99)
Potassium: 3.3 mmol/L — ABNORMAL LOW (ref 3.5–5.1)
Sodium: 135 mmol/L (ref 135–145)
Total Bilirubin: 0.7 mg/dL (ref 0.3–1.2)
Total Protein: 8.3 g/dL — ABNORMAL HIGH (ref 6.5–8.1)

## 2019-08-25 LAB — CBC WITH DIFFERENTIAL/PLATELET
Abs Immature Granulocytes: 0.04 10*3/uL (ref 0.00–0.07)
Basophils Absolute: 0.1 10*3/uL (ref 0.0–0.1)
Basophils Relative: 1 %
Eosinophils Absolute: 0 10*3/uL (ref 0.0–0.5)
Eosinophils Relative: 0 %
HCT: 42.4 % (ref 36.0–46.0)
Hemoglobin: 15.2 g/dL — ABNORMAL HIGH (ref 12.0–15.0)
Immature Granulocytes: 0 %
Lymphocytes Relative: 22 %
Lymphs Abs: 2.4 10*3/uL (ref 0.7–4.0)
MCH: 31.7 pg (ref 26.0–34.0)
MCHC: 35.8 g/dL (ref 30.0–36.0)
MCV: 88.5 fL (ref 80.0–100.0)
Monocytes Absolute: 1 10*3/uL (ref 0.1–1.0)
Monocytes Relative: 10 %
Neutro Abs: 7.5 10*3/uL (ref 1.7–7.7)
Neutrophils Relative %: 67 %
Platelets: 475 10*3/uL — ABNORMAL HIGH (ref 150–400)
RBC: 4.79 MIL/uL (ref 3.87–5.11)
RDW: 12.3 % (ref 11.5–15.5)
WBC: 11 10*3/uL — ABNORMAL HIGH (ref 4.0–10.5)
nRBC: 0 % (ref 0.0–0.2)

## 2019-08-25 MED ORDER — SODIUM CHLORIDE 0.9 % IV SOLN
Freq: Once | INTRAVENOUS | Status: AC
  Filled 2019-08-25: qty 1000

## 2019-08-25 MED ORDER — HEPARIN SOD (PORK) LOCK FLUSH 100 UNIT/ML IV SOLN
500.0000 [IU] | Freq: Once | INTRAVENOUS | Status: AC
  Administered 2019-08-25: 500 [IU] via INTRAVENOUS
  Filled 2019-08-25: qty 5

## 2019-08-25 MED ORDER — SODIUM CHLORIDE 0.9% FLUSH
10.0000 mL | Freq: Once | INTRAVENOUS | Status: AC
  Administered 2019-08-25: 10 mL via INTRAVENOUS
  Filled 2019-08-25: qty 10

## 2019-08-25 MED ORDER — SODIUM CHLORIDE 0.9 % IV SOLN
Freq: Once | INTRAVENOUS | Status: DC
  Filled 2019-08-25: qty 250

## 2019-08-25 MED ORDER — HEPARIN SOD (PORK) LOCK FLUSH 100 UNIT/ML IV SOLN
INTRAVENOUS | Status: AC
  Filled 2019-08-25: qty 5

## 2019-08-25 MED ORDER — PROCHLORPERAZINE EDISYLATE 10 MG/2ML IJ SOLN
10.0000 mg | Freq: Once | INTRAMUSCULAR | Status: AC
  Administered 2019-08-25: 10 mg via INTRAVENOUS
  Filled 2019-08-25: qty 2

## 2019-08-25 NOTE — Progress Notes (Signed)
Pt was in radiation and reported "pain in bladder and burning in bladder from radiation".  Pt also reports left flank pain.  States has had nausea and vomiting for 1.5 days along with increased weakness.

## 2019-08-25 NOTE — Telephone Encounter (Signed)
Husband Kristy Gordon called reporting that patient has port flush today, but he wants her to have labs drawn today "to check her functions". He states she is bedridden, and that her kidneys and bladder have been damaged from the brachytherapy. She is very sick past couple of days and cannot take her medicine, she is vomiting from withdrawing from several medications, and she is dehydrated. He states that she has been told that "we cannot answer questions because she is not a patient any more since COVID hit". Please advise

## 2019-08-25 NOTE — Progress Notes (Signed)
Multiple attempts to flush port for lab draws. +blood return but insufficient amount of blood for collection. Patient repositioned multiple times, unable to obtain blood return. Port flushes easily. No resistance felt.

## 2019-08-25 NOTE — Telephone Encounter (Signed)
Patient coming in at 77 for port labs and then see Dr Janese Banks

## 2019-08-27 LAB — URINE CULTURE: Culture: <10000 — AB

## 2019-08-28 NOTE — Progress Notes (Signed)
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 Positive pelvic LN s/p concurrent chemoradiation currently in remission  Chief complaint/ Reason for visit-acute visit for nausea vomiting  Heme/Onc history: patient is a 34 year old female G61 who initially presented to the ER on 03/15/2018 with symptoms of abdominal pain and cramping. She has also been having irregular menstrual bleeding and spotting on and off for the last few months.She had an ultrasound pelvis done in the ER which showed a large 5.8 cm hypoechoic vascular mass within the cervix and the lower uterine segment. Patient had last seen GYN in 2013 and had not had a Pap smear done since then. Patient was seen by GYN Dr. Nechama Guard on 1213 and was found to have firm friable cervix and multiple biopsies were taken which showed adenocarcinoma. She was then seen by Dr. Fransisca Connors from GYN oncology. Pelvic exam showed anteverted cervix that was replaced by tumor and extension of cancer into the left parametrium and possibly to the pelvic sidewall.  PET CT scan on 03/28/2018 showed 6 cm hypermetabolic cervical mass consistent with primary cervical carcinoma. Mild hypermetabolic bilateral parametrial right perirectal bilateral iliac lymph nodes consistent with metastatic disease. No evidence of metastatic disease within the abdomen chest or neck.  Cycle 1 of weekly  cisplatin and radiation started on 04/11/2018. She completed chemoradiation on 2/13/2020followed by vaginal brachii therapy. PET CT scan thereafter did not show any evidence of metastatic disease and interval resolution of hypermetabolic pelvic adenopathy and no hypermetabolism seen in the area of the cervix.  Patient also follows up with urology for issues of pelvic pain urinary frequency.  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  Interval history-patient states that ever since she underwent vaginal brachytherapy, she gets random episodes of nausea and vomiting which come on about every 4 to 6 weeks and last for a day or 2.  Symptoms resolved on its own.  She is having one such episode that started today morning.  She has been having continuous nausea and has not been able to keep any food down.  Has chronic symptoms of urinary frequency and urgency which is not any different at this time.  She has been tested for UTIs in the past and was not found to have 1.  ECOG PS- 1 Pain scale- 4 Opioid associated constipation- no  Review of systems- Review of Systems  Constitutional: Negative for chills, fever, malaise/fatigue and weight loss.  HENT: Negative for congestion, ear discharge and nosebleeds.   Eyes: Negative for blurred vision.  Respiratory: Negative for cough, hemoptysis, sputum production, shortness of breath and wheezing.   Cardiovascular: Negative for chest pain, palpitations, orthopnea and claudication.  Gastrointestinal: Positive for nausea and vomiting. Negative for abdominal pain, blood in stool, constipation, diarrhea, heartburn and melena.  Genitourinary: Positive for urgency. Negative for dysuria, flank pain, frequency and hematuria.  Musculoskeletal: Negative for back pain, joint pain and myalgias.  Skin: Negative for rash.  Neurological: Negative  for dizziness, tingling, focal weakness, seizures, weakness and headaches.    Endo/Heme/Allergies: Does not bruise/bleed easily.  Psychiatric/Behavioral: Negative for depression and suicidal ideas. The patient does not have insomnia.        Allergies  Allergen Reactions  . Amoxicillin Rash    Did it involve swelling of the face/tongue/throat, SOB, or low BP? No Did it involve sudden or severe rash/hives, skin peeling, or any reaction on the inside of your mouth or nose? No Did you need to seek medical attention at a hospital or doctor's office? No When did it last happen?Childhood If all above answers are "NO", may proceed with cephalosporin use.   Marland Kitchen Penicillins Hives    Did it involve swelling of the face/tongue/throat, SOB, or low BP? No Did it involve sudden or severe rash/hives, skin peeling, or any reaction on the inside of your mouth or nose? No Did you need to seek medical attention at a hospital or doctor's office? No When did it last happen?Childhood If all above answers are "NO", may proceed with cephalosporin use.      Past Medical History:  Diagnosis Date  . Anxiety   . Cervical cancer ([place]) 03/2018  . Hashimoto's thyroiditis   . 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 History of cervical cancer   . Hypothyroid   . Low back pain      Past Surgical History:  Procedure Laterality Date  . BUNIONECTOMY Right   . PORTA CATH INSERTION N/A 03/31/2018   Procedure: PORTA CATH INSERTION;  Surgeon: Algernon Huxley, MD;  Location: [facility];  Service: Cardiovascular;  Laterality: N/A;  . TONSILLECTOMY    . TRANSURETHRAL RESECTION OF BLADDER TUMOR N/A 06/20/2019   Procedure: TRANSURETHRAL RESECTION OF BLADDER TUMOR (TURBT);  Surgeon: [name], MD;  Location: [facility];  Service: Urology;  Laterality: N/A;    Social History   Socioeconomic History  . Marital status: Married    Spouse name: Not on file  . Number of children: Not on file  . Years of education: Not on file  . Highest education level: Not on file   Occupational History  . Not on file  Tobacco Use  . Smoking status: Current Some Day Smoker    Packs/day: 0.15    Years: 10.00    Pack years: 1.50    Types: Cigarettes  . Smokeless tobacco: Never Used  . Tobacco comment: 2 weeks to use a pack  Substance and Sexual Activity  . Alcohol use: Yes    Comment: rare  . Drug use: Yes    Types: Marijuana    Comment: quit opiates 5 years ago  . Sexual activity: Yes    Birth control/protection: None  Other Topics Concern  . Not on file  Social History Narrative  . Not on file   Social Determinants of Health   Financial Resource Strain:   . Difficulty of Paying Living Expenses:   Food Insecurity:   . Worried About Charity fundraiser in the Last Year:   . Arboriculturist in the Last Year:   Transportation Needs:   . Film/video editor (Medical):   Marland Kitchen Lack of Transportation (Non-Medical):   Physical Activity:   . Days of Exercise per Week:   . Minutes of Exercise per Session:   Stress:   . Feeling of Stress :   Social Connections:   . Frequency of Communication with Friends and Family:   .  Frequency of Social Gatherings with Friends and Family:   . Attends Religious Services:   . Active Member of Clubs or Organizations:   . Attends Archivist Meetings:   Marland Kitchen Marital Status:   Intimate Partner Violence:   . Fear of Current or Ex-Partner:   . Emotionally Abused:   Marland Kitchen Physically Abused:   . Sexually Abused:     Family History  Adopted: Yes  Family history unknown: Yes     Current Outpatient Medications:  .  ALPRAZolam (XANAX) 1 MG tablet, TAKE ONE TABLET BY MOUTH 3 TIMES DAILY AS NEEDED (Patient taking differently: Take 1 mg by mouth 3 (three) times daily as needed for anxiety. ), Disp: 90 tablet, Rfl: 3 .  amphetamine-dextroamphetamine (ADDERALL) 10 MG tablet, Take 1 tablet (10 mg total) by mouth daily as needed., Disp: 60 tablet, Rfl: 0 .  DULoxetine (CYMBALTA) 30 MG capsule, 2 capsules daily (Patient taking  differently: Take 60 mg by mouth daily. 2 capsules dail), Disp: 60 capsule, Rfl: 3 .  estradiol (VIVELLE-DOT) 0.1 MG/24HR patch, Place 1 patch (0.1 mg total) onto the skin 2 (two) times a week., Disp: 8 patch, Rfl: 12 .  gabapentin (NEURONTIN) 300 MG capsule, TAKE 1 CAPSULE BY MOUTH THREE TIMES DAILY, Disp: 90 capsule, Rfl: 0 .  levothyroxine (SYNTHROID) 175 MCG tablet, Take 1 tablet (175 mcg total) by mouth daily before breakfast., Disp: 90 tablet, Rfl: 1 .  lidocaine-prilocaine (EMLA) cream, Apply to affected area once, Disp: 30 g, Rfl: 3 .  METHADONE HCL PO, Take 15 mg by mouth daily at 6 (six) AM. , Disp: , Rfl:  .  ondansetron (ZOFRAN) 8 MG tablet, Take 8 mg by mouth every 8 (eight) hours as needed for nausea or vomiting., Disp: , Rfl:  .  oxybutynin (DITROPAN XL) 15 MG 24 hr tablet, Take 1 tablet (15 mg total) by mouth daily., Disp: 30 tablet, Rfl: 3 .  progesterone (PROMETRIUM) 200 MG capsule, Take 200 mg by mouth at bedtime with food for 12 days sequentially per 28 day cycle., Disp: 12 capsule, Rfl: 11 .  promethazine (PHENERGAN) 25 MG tablet, Take 12.5-25 mg by mouth every 12 (twelve) hours as needed for nausea or vomiting. , Disp: , Rfl:  .  amphetamine-dextroamphetamine (ADDERALL) 10 MG tablet, Take 1 tablet (10 mg total) by mouth daily as needed. (Patient not taking: Reported on 08/25/2019), Disp: 60 tablet, Rfl: 0 .  [START ON 10/28/2019] amphetamine-dextroamphetamine (ADDERALL) 10 MG tablet, Take 1 tablet (10 mg total) by mouth daily as needed. (Patient not taking: Reported on 08/25/2019), Disp: 60 tablet, Rfl: 0 .  lansoprazole (PREVACID) 30 MG capsule, Take 1 capsule (30 mg total) by mouth daily. (Patient not taking: Reported on 08/25/2019), Disp: 30 capsule, Rfl: 0 No current facility-administered medications for this visit.  Facility-Administered Medications Ordered in Other Visits:  .  sodium chloride flush (NS) 0.9 % injection 10 mL, 10 mL, Intravenous, PRN, Sindy Guadeloupe, MD, 10 mL  at 04/15/18 0825  Physical exam: There were no vitals filed for this visit. Physical Exam Constitutional:      General: She is not in acute distress. Cardiovascular:     Rate and Rhythm: Regular rhythm. Tachycardia present.     Heart sounds: Normal heart sounds.  Pulmonary:     Effort: Pulmonary effort is normal.     Breath sounds: Normal breath sounds.  Abdominal:     General: Bowel sounds are normal. There is no distension.  Palpations: Abdomen is soft.     Tenderness: There is no abdominal tenderness.  Skin:    General: Skin is warm and dry.  Neurological:     Mental Status: She is alert and oriented to person, place, and time.      CMP Latest Ref Rng & Units 08/25/2019  Glucose 70 - 99 mg/dL 123(H)  BUN 6 - 20 mg/dL 8  Creatinine 0.44 - 1.00 mg/dL 0.82  Sodium 135 - 145 mmol/L 135  Potassium 3.5 - 5.1 mmol/L 3.3(L)  Chloride 98 - 111 mmol/L 102  CO2 22 - 32 mmol/L 23  Calcium 8.9 - 10.3 mg/dL 8.9  Total Protein 6.5 - 8.1 g/dL 8.3(H)  Total Bilirubin 0.3 - 1.2 mg/dL 0.7  Alkaline Phos 38 - 126 U/L 75  AST 15 - 41 U/L 31  ALT 0 - 44 U/L 32   CBC Latest Ref Rng & Units 08/25/2019  WBC 4.0 - 10.5 K/uL 11.0(H)  Hemoglobin 12.0 - 15.0 g/dL 15.2(H)  Hematocrit 36.0 - 46.0 % 42.4  Platelets 150 - 400 K/uL 475(H)     Assessment and plan- Patient is a 34 y.o. female  withadenocarcinoma of the cervix FIGO stage III C1r T2N1M0with metastases to the external iliac(pelvic lymph nodes).  She is s/p concurrent chemoradiation and vaginal brachytherapy and currently in remission.  She is here for acute visit for nausea vomiting  Patient has had significant nausea vomiting over the last 12 hours and she has not been able to keep any food down.  She is feeling lightheaded when she came to our clinic today.  Labs are fairly unremarkable and a mildly elevated white count and platelet count may be reactive.  We will give her 1 L of IV fluids along with 10 mg of IV Compazine.  I will  check her urinalysis and urine culture but hold off on giving her empiric antibiotics unless urine culture is positive.  Patient will continue as needed antinausea medications.  She will call us if her symptoms fail to improve.  Otherwise she will keep her future appointments as scheduled  Hypokalemia: We will give her IV potassium and IV fluids today   Visit Diagnosis 1. Radiation cystitis   2. 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[place])   3. Non-intractable vomiting with nausea, unspecified vomiting type   4. Hypokalemia      Dr. Randa Evens, MD, MPH [facility] [phone] 08/28/2019 1:36 PM

## 2019-08-30 ENCOUNTER — Ambulatory Visit: Payer: Medicaid Other

## 2019-09-07 ENCOUNTER — Other Ambulatory Visit: Payer: Self-pay | Admitting: Family Medicine

## 2019-09-07 NOTE — Telephone Encounter (Signed)
Requested medication (s) are due for refill today: yes  Requested medication (s) are on the active medication list: yes  Last refill:  07/31/2019  Future visit scheduled: yes  Notes to clinic:  this refill cannot be delegated    Requested Prescriptions  Pending Prescriptions Disp Refills   ALPRAZolam (XANAX) 1 MG tablet [Pharmacy Med Name: ALPRAZOLAM 1 MG TAB] 90 tablet     Sig: TAKE ONE TABLET BY MOUTH 3 TIMES DAILY AS NEEDED      Not Delegated - Psychiatry:  Anxiolytics/Hypnotics Failed - 09/07/2019 10:00 AM      Failed - This refill cannot be delegated      Passed - Urine Drug Screen completed in last 360 days.      Passed - Valid encounter within last 6 months    Recent Outpatient Visits           3 months ago Attention deficit disorder (ADD) without hyperactivity   [facility] [name], MD   5 months ago Attention deficit disorder, unspecified hyperactivity presence   [facility] [name], MD   7 months ago [place], [place], [place]   10 months ago Other specified hypothyroidism   [facility] [name], MD   1 year ago Severe anxiety with panic   [place], [place]       Future Appointments             In 3 weeks [name], MD [facility], [place]

## 2019-09-16 ENCOUNTER — Other Ambulatory Visit: Payer: Self-pay | Admitting: Radiation Oncology

## 2019-09-16 ENCOUNTER — Other Ambulatory Visit: Payer: Self-pay | Admitting: Family Medicine

## 2019-09-16 DIAGNOSIS — F988 Other specified behavioral and emotional disorders with onset usually occurring in childhood and adolescence: Secondary | ICD-10-CM

## 2019-09-18 ENCOUNTER — Ambulatory Visit: Payer: Medicaid Other | Admitting: Radiation Oncology

## 2019-09-18 MED ORDER — PROMETHAZINE HCL 25 MG PO TABS: 12.5000 mg | ORAL_TABLET | Freq: Two times a day (BID) | ORAL | 2 refills | Status: DC | PRN

## 2019-09-18 MED ORDER — LANSOPRAZOLE 30 MG PO CPDR: 30.0000 mg | DELAYED_RELEASE_CAPSULE | Freq: Every day | ORAL | 5 refills | Status: DC

## 2019-09-18 MED ORDER — AMPHETAMINE-DEXTROAMPHETAMINE 10 MG PO TABS: 10.0000 mg | ORAL_TABLET | Freq: Every day | ORAL | 0 refills | Status: DC | PRN

## 2019-09-20 ENCOUNTER — Inpatient Hospital Stay: Payer: Medicaid Other | Attending: Obstetrics and Gynecology | Admitting: Obstetrics and Gynecology

## 2019-09-20 ENCOUNTER — Other Ambulatory Visit: Payer: Self-pay

## 2019-09-20 VITALS — BP 144/100 | HR 92 | Temp 98.2°F | Resp 18 | Wt 284.7 lb

## 2019-09-20 DIAGNOSIS — F419 Anxiety disorder, unspecified: Secondary | ICD-10-CM | POA: Insufficient documentation

## 2019-09-20 DIAGNOSIS — F329 Major depressive disorder, single episode, unspecified: Secondary | ICD-10-CM | POA: Insufficient documentation

## 2019-09-20 DIAGNOSIS — K760 Fatty (change of) liver, not elsewhere classified: Secondary | ICD-10-CM | POA: Insufficient documentation

## 2019-09-20 DIAGNOSIS — N3041 Irradiation cystitis with hematuria: Secondary | ICD-10-CM | POA: Insufficient documentation

## 2019-09-20 DIAGNOSIS — Z79899 Other long term (current) drug therapy: Secondary | ICD-10-CM | POA: Diagnosis not present

## 2019-09-20 DIAGNOSIS — Z9221 Personal history of antineoplastic chemotherapy: Secondary | ICD-10-CM | POA: Insufficient documentation

## 2019-09-20 DIAGNOSIS — R1032 Left lower quadrant pain: Secondary | ICD-10-CM | POA: Diagnosis not present

## 2019-09-20 DIAGNOSIS — Z6841 Body Mass Index (BMI) 40.0 and over, adult: Secondary | ICD-10-CM | POA: Insufficient documentation

## 2019-09-20 DIAGNOSIS — Z08 Encounter for follow-up examination after completed treatment for malignant neoplasm: Secondary | ICD-10-CM | POA: Diagnosis not present

## 2019-09-20 DIAGNOSIS — E063 Autoimmune thyroiditis: Secondary | ICD-10-CM | POA: Insufficient documentation

## 2019-09-20 DIAGNOSIS — I10 Essential (primary) hypertension: Secondary | ICD-10-CM | POA: Diagnosis not present

## 2019-09-20 DIAGNOSIS — G8929 Other chronic pain: Secondary | ICD-10-CM | POA: Diagnosis not present

## 2019-09-20 DIAGNOSIS — Z923 Personal history of irradiation: Secondary | ICD-10-CM | POA: Diagnosis not present

## 2019-09-20 DIAGNOSIS — E669 Obesity, unspecified: Secondary | ICD-10-CM | POA: Insufficient documentation

## 2019-09-20 DIAGNOSIS — F17210 Nicotine dependence, cigarettes, uncomplicated: Secondary | ICD-10-CM | POA: Insufficient documentation

## 2019-09-20 DIAGNOSIS — R160 Hepatomegaly, not elsewhere classified: Secondary | ICD-10-CM | POA: Diagnosis not present

## 2019-09-20 DIAGNOSIS — Y842 Radiological procedure and radiotherapy as the cause of abnormal reaction of the patient, or of later complication, without mention of misadventure at the time of the procedure: Secondary | ICD-10-CM | POA: Diagnosis not present

## 2019-09-20 DIAGNOSIS — R109 Unspecified abdominal pain: Secondary | ICD-10-CM

## 2019-09-20 DIAGNOSIS — Z8541 Personal history of malignant neoplasm of cervix uteri: Secondary | ICD-10-CM | POA: Diagnosis not present

## 2019-09-20 DIAGNOSIS — E28319 Asymptomatic premature menopause: Secondary | ICD-10-CM | POA: Diagnosis not present

## 2019-09-20 DIAGNOSIS — N3040 Irradiation cystitis without hematuria: Secondary | ICD-10-CM

## 2019-09-20 DIAGNOSIS — R918 Other nonspecific abnormal finding of lung field: Secondary | ICD-10-CM | POA: Insufficient documentation

## 2019-09-20 DIAGNOSIS — Z780 Asymptomatic menopausal state: Secondary | ICD-10-CM

## 2019-09-20 NOTE — Progress Notes (Signed)
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 Positive pelvic LN  Subjective:  Kristy Gordon is a 34 y.o. G106 female, initially seen in consultation from Dr. Gilman Schmidt for cervical adenocarcinoma, returns to clinic for continued surveillance.   02/01/2020 Pap NILM/HRHPV negative  02/20/2019 PET IMPRESSION: 1. Two small subpleural pulmonary nodules are not seen on prior. Recommend close attention on follow-up. 2. No hypermetabolic uterine cervical tissue to suggest local recurrence. 3. No hypermetabolic pelvic or retroperitoneal lymph nodes to suggest metastatic disease recurrence. 4. Diffuse hypermetabolic thyroid gland activity consistent with thyroiditis.  06/09/2019 CT for hematuria evaluation 1. No CT findings of the abdomen or pelvis to explain left-sided flank pain or hematuria. No evidence of urinary tract calculus or hydronephrosis. No urinary tract filling defects on delayed phase imaging. 2. No CT abnormality of the cervix or pelvis given history of malignancy. 3.  Hepatomegaly and hepatic steatosis.  06/20/2019 she underwent transurethral bladder biopsy and pathology c/w radiation cystitis.   08/25/2019 she was seen by Dr. Janese Banks who is treating hypokalemia and non-intractable vomiting with nausea, uncertain etiology.   With regard to radiation cystitis she had been treated with oxybutynin (DITROPAN XL) 15 MG 24 hr tablet and at one time also Myrbetriq, but no significant relief. She has gross small amount hematuria a few times a week, burning with urination, bladder spasms, and frequency. Evaluation as noted above. She has had multiple urine cultures and results have not shown definitive infection. No other procedures or treatments have been performed. She has not had hyperbaric therapy. Per her report her Urologist recommended referral to [facility]. She has been able to keep working and she oversees  a Pharmacist, community.   Gyn-Oncology History:  Kristy Gordon is a pleasant G61 female, initially seen in consultation from Dr. Gilman Schmidt for cervical cancer. Please refer to prior notes for complete details.  Seen by Dr Gilman Schmidt 12/13 in office and and cervix firm and friable.  Multiple biopsies showed adenocarcinoma. PAP pending. States today that she has pain in left pelvis.   03/23/2018 HIV Screen 4th Generation wRfx Non Reactive Non Reactive     She was seen by Dr. Fransisca Connors. Pelvic exam showed anteverted cervix that was replaced by tumor and extension of cancer into the left parametrium and possibly to the pelvic sidewall.We discussed that preservation of fertility is not possible with radiation treatment. She lost several pregnancies and does not have any children, but no longer desires fertility and husband confirmed this. So this is not an issue.  PET CT scan on 03/28/2018 showed 6 cm hypermetabolic cervical mass consistent with primary cervical carcinoma. Mild hypermetabolic bilateral parametrial right perirectal bilateral iliac lymph nodes consistent with metastatic disease. No evidence of metastatic disease within the abdomen chest or neck.  She received concurrent cisplatin & radiation 04/11/2018- 05/19/2018 (6 cycles) followed by vaginal brachytherapy at [facility] completed 06/07/2018.   PET - 08/31/2018 for restaging IMPRESSION: 1. Clear interval response to therapy. Cervix has decreased substantially in size in the interval with no substantial hypermetabolism today although SUV measurement is difficult due to misregistration with adjacent radiolabeled urine in the bladder. 2. Interval resolution of the hypermetabolic pelvic lymphadenopathy seen previously. 3. No new or progressive findings on today's study.   Restaging pet showed no substantial hypermetabolism of cervix and resolution of hypermetabolic pelvic lymphadenopathy. Since completion of radiation she has complained of left lower quadrant  pain. She has been using  vaginal dilators and has been receiving pelvic floor PT which somewhat improved her pain though has missed several appointments.   CT in 11/22/2018 for vaginal bleeding and LLQ pain demonstrated  1. Unchanged post treatment appearance of the cervix and pelvis, with no change in tiny pelvic sidewall and iliac lymph nodes, previously enlarged and hypermetabolic. 2. No evidence of metastatic disease in the chest, abdomen, or pelvis. 3. No specific findings in the abdomen or pelvis to explain left lower quadrant pain. Large burden of stool in the colon. 4. Soft tissue in the anterior mediastinum, consistent with thymus and likely reflecting thymic rebound in the setting of recent chemotherapy.  12/02/2018 She saw Dr. Baruch Gouty and recommendation for start dilator therapy.   She has gone to Pelvic floor PT for 6-8 sessions.  She has had issues with vasomotor symptoms and treated with Vivelle-Dot 0.075 mg/24 hr with progesterone 200 mg 12 days of month. Due to continued symptoms TFTs were obtained and TSH was elevated and they recommended Increase levothyroxine from 125 to 175 mcg daily.   Ref Range & Units 04/04/2019  TSH 0.450 - 4.500 uIU/mL 7.970High       Problem List: Patient Active Problem List   Diagnosis Date Noted  . Pelvic floor dysfunction 04/10/2019  . Bladder spasms 02/27/2019  . Microscopic hematuria 02/27/2019  . Vaginal pain 11/18/2018  . Premature menopause on hormone replacement therapy 10/20/2018  . Adjustment disorder with mixed anxiety and depressed mood 10/20/2018  . Goiter 05/20/2018  . Hypothyroidism 05/20/2018  . Tobacco use 05/20/2018  . Cervical cancer ([place]) 05/11/2018  . Neuropathy 05/07/2018  . Nausea without vomiting 04/25/2018  . Iron deficiency anemia 04/11/2018  . Goals of care, counseling/discussion 03/28/2018  . 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[place]) 03/23/2018  . Hypothyroidism due to Hashimoto's thyroiditis 03/18/2018  . H/O fetal  demise, not currently pregnant 03/18/2018  . Lower abdominal pain 01/04/2018  . Anxiety 09/05/2015  . Clinical depression 09/05/2015  . Big thyroid 09/05/2015  . Cannot sleep 09/05/2015  . Adiposity 09/05/2015  . 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 Tobacco use disorder 09/05/2015    Past Medical History: Past Medical History:  Diagnosis Date  . Anxiety   . Cervical cancer ([place]) 03/2018  . Hashimoto's thyroiditis   . 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 History of cervical cancer   . Hypothyroid   . Low back pain     Past Surgical History: Past Surgical History:  Procedure Laterality Date  . BUNIONECTOMY Right   . PORTA CATH INSERTION N/A 03/31/2018   Procedure: PORTA CATH INSERTION;  Surgeon: Algernon Huxley, MD;  Location: [facility];  Service: Cardiovascular;  Laterality: N/A;  . TONSILLECTOMY    . TRANSURETHRAL RESECTION OF BLADDER TUMOR N/A 06/20/2019   Procedure: TRANSURETHRAL RESECTION OF BLADDER TUMOR (TURBT);  Surgeon: [name], MD;  Location: [facility];  Service: Urology;  Laterality: N/A;    OB History:  OB History  Gravida Para Term Preterm AB Living  10 2   2 8  0  SAB TAB Ectopic Multiple Live Births  8       0    # Outcome Date GA Lbr Len/2nd Weight Sex Delivery Anes PTL Lv  10 SAB           9 SAB           8 SAB  7 SAB           6 SAB           5 SAB           4 SAB           3 SAB           2 Preterm      Vag-Spont   FD  1 Preterm      Vag-Spont   FD    Family History: Family History  Adopted: Yes  Family history unknown: Yes    Social History: Social History   Socioeconomic History  . Marital status: Married    Spouse name: Not on file  . Number of children: Not on file  . Years of education: Not on file  . Highest education level: Not on file  Occupational History  . Not on file  Tobacco Use  . Smoking status: Current Some Day Smoker     Packs/day: 0.15    Years: 10.00    Pack years: 1.50    Types: Cigarettes  . Smokeless tobacco: Never Used  . Tobacco comment: 2 weeks to use a pack  Vaping Use  . Vaping Use: Some days  Substance and Sexual Activity  . Alcohol use: Yes    Comment: rare  . Drug use: Yes    Types: Marijuana    Comment: quit opiates 5 years ago  . Sexual activity: Yes    Birth control/protection: None  Other Topics Concern  . Not on file  Social History Narrative  . Not on file   Social Determinants of Health   Financial Resource Strain:   . Difficulty of Paying Living Expenses:   Food Insecurity:   . Worried About Charity fundraiser in the Last Year:   . Arboriculturist in the Last Year:   Transportation Needs:   . Film/video editor (Medical):   Marland Kitchen Lack of Transportation (Non-Medical):   Physical Activity:   . Days of Exercise per Week:   . Minutes of Exercise per Session:   Stress:   . Feeling of Stress :   Social Connections:   . Frequency of Communication with Friends and Family:   . Frequency of Social Gatherings with Friends and Family:   . Attends Religious Services:   . Active Member of Clubs or Organizations:   . Attends Archivist Meetings:   Marland Kitchen Marital Status:   Intimate Partner Violence:   . Fear of Current or Ex-Partner:   . Emotionally Abused:   Marland Kitchen Physically Abused:   . Sexually Abused:     Allergies: Allergies  Allergen Reactions  . Amoxicillin Rash    Did it involve swelling of the face/tongue/throat, SOB, or low BP? No Did it involve sudden or severe rash/hives, skin peeling, or any reaction on the inside of your mouth or nose? No Did you need to seek medical attention at a hospital or doctor's office? No When did it last happen?Childhood If all above answers are "NO", may proceed with cephalosporin use.   Marland Kitchen Penicillins Hives    Did it involve swelling of the face/tongue/throat, SOB, or low BP? No Did it involve sudden or severe  rash/hives, skin peeling, or any reaction on the inside of your mouth or nose? No Did you need to seek medical attention at a hospital or doctor's office? No When did it last happen?Childhood If all above answers are "NO", may proceed with  cephalosporin use.     Current Medications: Current Outpatient Medications  Medication Sig Dispense Refill  . amphetamine-dextroamphetamine (ADDERALL) 10 MG tablet Take 1 tablet (10 mg total) by mouth daily as needed. 60 tablet 0  . DULoxetine (CYMBALTA) 30 MG capsule 2 capsules daily (Patient taking differently: Take 60 mg by mouth daily. 2 capsules dail) 60 capsule 3  . estradiol (VIVELLE-DOT) 0.1 MG/24HR patch Place 1 patch (0.1 mg total) onto the skin 2 (two) times a week. 8 patch 12  . gabapentin (NEURONTIN) 300 MG capsule TAKE 1 CAPSULE BY MOUTH THREE TIMES DAILY 90 capsule 0  . lansoprazole (PREVACID) 30 MG capsule Take 1 capsule (30 mg total) by mouth daily. 30 capsule 5  . levothyroxine (SYNTHROID) 175 MCG tablet Take 1 tablet (175 mcg total) by mouth daily before breakfast. 90 tablet 1  . lidocaine-prilocaine (EMLA) cream Apply to affected area once 30 g 3  . METHADONE HCL PO Take 15 mg by mouth daily at 6 (six) AM.     . ondansetron (ZOFRAN) 8 MG tablet Take 8 mg by mouth every 8 (eight) hours as needed for nausea or vomiting.    Marland Kitchen oxybutynin (DITROPAN XL) 15 MG 24 hr tablet Take 1 tablet (15 mg total) by mouth daily. 30 tablet 3  . progesterone (PROMETRIUM) 200 MG capsule Take 200 mg by mouth at bedtime with food for 12 days sequentially per 28 day cycle. 12 capsule 11  . promethazine (PHENERGAN) 25 MG tablet Take 0.5-1 tablets (12.5-25 mg total) by mouth every 12 (twelve) hours as needed for nausea or vomiting. 30 tablet 2  . ALPRAZolam (XANAX) 1 MG tablet Take 1 tablet (1 mg total) by mouth 3 (three) times daily as needed for anxiety. 90 tablet 2  . amphetamine-dextroamphetamine (ADDERALL) 10 MG tablet Take 1 tablet (10 mg total) by mouth  daily as needed. 60 tablet 0  . [START ON 10/28/2019] amphetamine-dextroamphetamine (ADDERALL) 10 MG tablet Take 1 tablet (10 mg total) by mouth daily as needed. (Patient not taking: Reported on 08/25/2019) 60 tablet 0   No current facility-administered medications for this visit.   Facility-Administered Medications Ordered in Other Visits  Medication Dose Route Frequency Provider Last Rate Last Admin  . sodium chloride flush (NS) 0.9 % injection 10 mL  10 mL Intravenous PRN Sindy Guadeloupe, MD   10 mL at 04/15/18 0825   Review of Systems - multiple positive symptoms General: fatigue, weakness, decreased appetite  HEENT: hearing loss due to chemotherapy, no headaches, no visual issues.   Lungs: shortness of breath, cough  Cardiac: no complaints  GI: abdominal pain periumbilical, LLQ and extending to flank; pain induced n/v   GU: as noted per interval history for bladder issues; vaginal spotting/pain; unable to have intercourse  Musculoskeletal: no complaints  Extremities: no complaints  Skin: no complaints  Neuro: feeling sad; peripheral neuropathy  Endocrine: no complaints  Psych: no complaints      Objective:  Physical Examination:  BP (!) 144/100 (BP Location: Left Arm, Patient Position: Sitting, Cuff Size: Large)   Pulse 92   Temp 98.2 F (36.8 C) (Oral)   Resp 18   Wt 284 lb 11.2 oz (129.1 kg)   SpO2 100%   BMI 44.59 kg/m    Repeat BP 140s/101  ECOG Performance Status: 1 - Symptomatic but completely ambulatory  GENERAL: Patient is a well appearing female in no acute distress HEENT:  PERRL, neck supple with midline trachea.  NODES:  No cervical, supraclavicular,  axillary, or inguinal lymphadenopathy palpated.  LUNGS:  Clear to auscultation bilaterally.   HEART:  Regular rate and rhythm.  ABDOMEN:  Soft, tender to deep palpation umbilical and LLQ, nondistended and no masses/ascites/hernias/organomegaly EXTREMITIES:  No peripheral edema.   SKIN:  Clear with no obvious  rashes or skin changes. No nail dyscrasia. NEURO:  Nonfocal. Well oriented.  Appropriate affect.  Pelvic: EGBUS: no lesions Cervix: unable to visualize fully Vagina: no lesions, no discharge or bleeding; foreshortened to 4 cm.  Uterus:  nontender; unable to assess size due to habitus.  Adnexa: no palpable masses, parametria smooth  Rectovaginal: deferred  Labs Lab Results  Component Value Date   WBC 11.0 (H) 08/25/2019   HGB 15.2 (H) 08/25/2019   HCT 42.4 08/25/2019   MCV 88.5 08/25/2019   PLT 475 (H) 08/25/2019     Chemistry      Component Value Date/Time   NA 135 08/25/2019 1016   NA 138 09/11/2015 0954   NA 142 09/11/2011 1701   K 3.3 (L) 08/25/2019 1016   K 4.2 09/11/2011 1701   CL 102 08/25/2019 1016   CL 109 (H) 09/11/2011 1701   CO2 23 08/25/2019 1016   CO2 22 09/11/2011 1701   BUN 8 08/25/2019 1016   BUN 11 09/11/2015 0954   BUN 10 09/11/2011 1701   CREATININE 0.82 08/25/2019 1016   CREATININE 0.67 09/11/2011 1701   GLU 84 04/16/2010 0000      Component Value Date/Time   CALCIUM 8.9 08/25/2019 1016   CALCIUM 9.3 09/11/2011 1701   ALKPHOS 75 08/25/2019 1016   ALKPHOS 101 09/11/2011 1701   AST 31 08/25/2019 1016   AST 28 09/11/2011 1701   ALT 32 08/25/2019 1016   ALT 20 09/11/2011 1701   BILITOT 0.7 08/25/2019 1016   BILITOT 0.5 09/11/2015 0954   BILITOT 0.3 09/11/2011 1701          Assessment:  Reka A Lye is a 34 y.o. female diagnosed with at least stage IIB cervical adenocarcinoma s/p primary chemoradiation with excellent response based on PET scan and clinically NED on exam.   Symptoms of umbilical pain, left lower quadrant abdominal/pelvic pain, could be due to radiation cystitis but umbilical is concerning for different etiology. It is very difficult to discern as she has chronic pain and known radiation cystitis.   Premature menopause and improved controlled vasomotor symptoms on current HRT regimen.   Sexual function concerns and vaginal  agglutination s/p pelvic floor PT with no improvement.   Radiation cystitis  Pulmonary nodules, uncertain etiology  Hypertension, asymtomatic  Body mass index is 44.59 kg/m.  Medical co-morbidities complicating care: anxiety, depression, thyroiditis, smoker.  Plan:   Problem List Items Addressed This Visit    None    Visit Diagnoses    History of cervical cancer    -  Primary   Pulmonary nodules       Radiation cystitis         Obtain PET scan to assess pain symptoms and follow up pulmonary nodules.  Refer to [facility] for radiation cystitis management. Grade 3-4 based on symptoms she is describing today. She has refractory symptoms despite oral therapy. She may benefit from hyperbaric. Will defer to [facility] regarding any benefit for formalin instillation or other measures.   Continue close surveillance with Dr. Baruch Gouty as scheduled 09/25/2019. Follow up with [place] in 3 months. At some point we may be able to begin alternating surveillance with Dr. Gilman Schmidt.  Continue to see Dr. Billey Chang Palliative care for pain management. She has not seen him for a while and we recommended that she continue to see him.   Discontine pelvic floor PT given her challenges and pain with the treatments. Continue dilator therapy three times per week which will at least help with gynecologic exam.   Repeat follow up with her PCP for thyroid management and blood pressure management. We obtained an appointment for her to see her PCP this Friday for BP management.   Continue Vivelle transdermal system to 0.1 mg/day. If she has persistent symptoms Dr. Gunnar Bulla previously provide 2 different options including transdermal ERT with Activella or switching to OCP.   We previously discussed exercise regimens to help with weight loss.   The patient's diagnosis, an outline of the further diagnostic and laboratory studies which will be required, the recommendation, and alternatives were discussed. We also reviewed  her PET scan which was reassuring.   All questions were answered to the patient's satisfaction.  Kristy Devery Gaetana Michaelis, MD

## 2019-09-22 ENCOUNTER — Ambulatory Visit: Payer: Medicaid Other | Admitting: Adult Health

## 2019-09-24 DIAGNOSIS — Z23 Encounter for immunization: Secondary | ICD-10-CM | POA: Diagnosis not present

## 2019-09-25 ENCOUNTER — Ambulatory Visit: Payer: Medicaid Other | Attending: Radiation Oncology | Admitting: Radiation Oncology

## 2019-09-29 NOTE — Progress Notes (Signed)
Trena Platt Cummings,acting as a scribe for Wilhemena Durie, MD.,have documented all relevant documentation on the behalf of Wilhemena Durie, MD,as directed by  Wilhemena Durie, MD while in the presence of Wilhemena Durie, MD.  Established patient visit   Patient: Kristy Gordon   DOB: Jan 28, 1986   34 y.o. Female  MRN: [id number] Visit Date: 10/02/2019  Today's healthcare provider: Wilhemena Durie, MD   Chief Complaint  Patient presents with  . ADD   Subjective    HPI   Patient presents in office today for a 4 month follow up on Attention deficit disorder, patient states that she is in good compliance with medication.  Patient has appointment at [facility] urogynecology for her bladder issues. She is feeling well emotionally is doing well with her ADD with new medication.  It helps with her new job. Attention deficit disorder (ADD) without hyperactivity From 05/31/2019-Continue low-dose Adderall daily as needed.   Class 3 severe obesity due to excess calories without serious comorbidity with body mass index (BMI) of 45.0 to 49.9 in adult [facility]) From 05/31/2019- Instructed her to work on her diet and exercise habits over time.        Medications: Outpatient Medications Prior to Visit  Medication Sig  . ALPRAZolam (XANAX) 1 MG tablet Take 1 tablet (1 mg total) by mouth 3 (three) times daily as needed for anxiety.  Marland Kitchen amphetamine-dextroamphetamine (ADDERALL) 10 MG tablet Take 1 tablet (10 mg total) by mouth daily as needed.  Derrill Memo ON 10/28/2019] amphetamine-dextroamphetamine (ADDERALL) 10 MG tablet Take 1 tablet (10 mg total) by mouth daily as needed.  Marland Kitchen amphetamine-dextroamphetamine (ADDERALL) 10 MG tablet Take 1 tablet (10 mg total) by mouth daily as needed.  . DULoxetine (CYMBALTA) 30 MG capsule 2 capsules daily (Patient taking differently: Take 60 mg by mouth daily. 2 capsules dail)  . estradiol (VIVELLE-DOT) 0.1 MG/24HR patch Place 1 patch (0.1 mg total) onto the  skin 2 (two) times a week.  . gabapentin (NEURONTIN) 300 MG capsule TAKE 1 CAPSULE BY MOUTH THREE TIMES DAILY  . lansoprazole (PREVACID) 30 MG capsule Take 1 capsule (30 mg total) by mouth daily.  Marland Kitchen levothyroxine (SYNTHROID) 175 MCG tablet Take 1 tablet (175 mcg total) by mouth daily before breakfast.  . lidocaine-prilocaine (EMLA) cream Apply to affected area once  . METHADONE HCL PO Take 15 mg by mouth daily at 6 (six) AM.   . ondansetron (ZOFRAN) 8 MG tablet Take 8 mg by mouth every 8 (eight) hours as needed for nausea or vomiting.  Marland Kitchen oxybutynin (DITROPAN XL) 15 MG 24 hr tablet Take 1 tablet (15 mg total) by mouth daily.  . progesterone (PROMETRIUM) 200 MG capsule Take 200 mg by mouth at bedtime with food for 12 days sequentially per 28 day cycle.  . promethazine (PHENERGAN) 25 MG tablet Take 0.5-1 tablets (12.5-25 mg total) by mouth every 12 (twelve) hours as needed for nausea or vomiting.   Facility-Administered Medications Prior to Visit  Medication Dose Route Frequency Provider  . sodium chloride flush (NS) 0.9 % injection 10 mL  10 mL Intravenous PRN Sindy Guadeloupe, MD    Review of Systems  Constitutional: Negative for appetite change, chills, fatigue and fever.  Respiratory: Negative for chest tightness and shortness of breath.   Cardiovascular: Negative for chest pain and palpitations.  Gastrointestinal: Negative for abdominal pain, nausea and vomiting.  Neurological: Negative for dizziness and weakness.  Hematological: Negative.   Psychiatric/Behavioral: Negative.  All other systems reviewed and are negative.      Objective    BP 121/80 (BP Location: Right Arm, Patient Position: Sitting, Cuff Size: Large)   Pulse 69   Temp (!) 97.3 F (36.3 C) (Temporal)   Ht 5\' 7"  (1.702 m)   Wt 289 lb 3.2 oz (131.2 kg)   BMI 45.30 kg/m     Physical Exam Vitals reviewed.  Constitutional:      Appearance: She is obese.  HENT:     Right Ear: External ear normal.     Left Ear:  External ear normal.  Eyes:     General: No scleral icterus. Neck:     Comments: Goiter present.,stable. Cardiovascular:     Rate and Rhythm: Normal rate and regular rhythm.     Heart sounds: Normal heart sounds.  Pulmonary:     Effort: Pulmonary effort is normal.     Breath sounds: Normal breath sounds.  Abdominal:     Palpations: Abdomen is soft.  Skin:    General: Skin is warm and dry.  Neurological:     General: No focal deficit present.     Mental Status: She is alert and oriented to person, place, and time.  Psychiatric:        Mood and Affect: Mood normal.        Behavior: Behavior normal.        Thought Content: Thought content normal.        Judgment: Judgment normal.       No results found for any visits on 10/02/19.  Assessment & Plan     1. Attention deficit disorder (ADD) without hyperactivity Continue Adderall working days.  2. Hypothyroidism due to Hashimoto's thyroiditis Follow TSH  3. Adenocarcinoma of cervix [facility]) Per oncology and gynecology  4. Anxiety [place] stable  5. Class 3 severe obesity due to excess calories without serious comorbidity with body mass index (BMI) of 45.0 to 49.9 in adult [facility]) Just diet and exercise.   No follow-ups on file.      I, Wilhemena Durie, MD, have reviewed all documentation for this visit. The documentation on 10/07/19 for the exam, diagnosis, procedures, and orders are all accurate and complete.    Akia Desroches Cranford Mon, MD  [facility] [phone] (phone) [phone] (fax)  [place]

## 2019-10-02 ENCOUNTER — Other Ambulatory Visit: Payer: Self-pay

## 2019-10-02 ENCOUNTER — Ambulatory Visit: Payer: Medicaid Other | Admitting: Family Medicine

## 2019-10-02 ENCOUNTER — Encounter: Payer: Self-pay | Admitting: Family Medicine

## 2019-10-02 VITALS — BP 121/80 | HR 69 | Temp 97.3°F | Ht 67.0 in | Wt 289.2 lb

## 2019-10-02 DIAGNOSIS — C539 Malignant neoplasm of cervix uteri, unspecified: Secondary | ICD-10-CM

## 2019-10-02 DIAGNOSIS — E063 Autoimmune thyroiditis: Secondary | ICD-10-CM | POA: Diagnosis not present

## 2019-10-02 DIAGNOSIS — F419 Anxiety disorder, unspecified: Secondary | ICD-10-CM | POA: Diagnosis not present

## 2019-10-02 DIAGNOSIS — E038 Other specified hypothyroidism: Secondary | ICD-10-CM

## 2019-10-02 DIAGNOSIS — Z6841 Body Mass Index (BMI) 40.0 and over, adult: Secondary | ICD-10-CM

## 2019-10-02 DIAGNOSIS — F988 Other specified behavioral and emotional disorders with onset usually occurring in childhood and adolescence: Secondary | ICD-10-CM

## 2019-10-03 ENCOUNTER — Inpatient Hospital Stay (HOSPITAL_BASED_OUTPATIENT_CLINIC_OR_DEPARTMENT_OTHER): Payer: Medicaid Other | Admitting: Hospice and Palliative Medicine

## 2019-10-03 DIAGNOSIS — Z515 Encounter for palliative care: Secondary | ICD-10-CM

## 2019-10-03 NOTE — Progress Notes (Signed)
I attempted to call patient at time of scheduled virtual visit but was unable to reach patient by phone. VM left. Case discussed with Dr. Janese Banks.

## 2019-10-10 IMAGING — CT CT CHEST WITH CONTRAST
1 series · 1 of 1 positions shown · IV contrast (omnipaque)
Comparison: PET-CT, 08/31/2018, 03/28/2018

CLINICAL DATA: Cervical cancer restaging, left lower quadrant pain,
new vaginal bleeding

EXAM:
CT CHEST, ABDOMEN, AND PELVIS WITH CONTRAST
TECHNIQUE: Multidetector CT imaging of the chest, abdomen and pelvis was
performed following the standard protocol during bolus
administration of intravenous contrast.
CONTRAST:  100mL OMNIPAQUE IOHEXOL 300 MG/ML SOLN, additional oral
enteric contrast

[Series 1: topogram · coronal · 1.50mm/px · 1 of 1 slices shown]
[im 1/1]
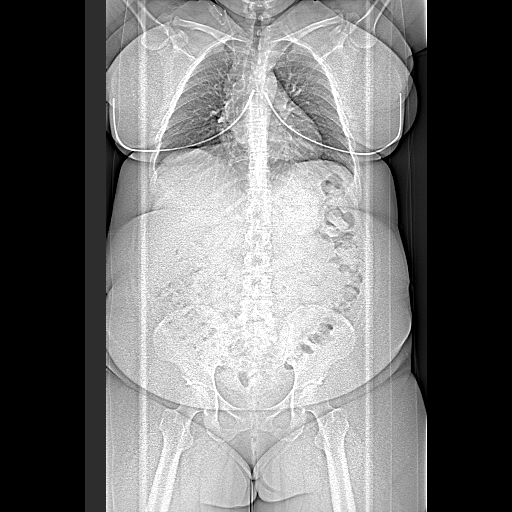

[1 of 1 positions shown; findings below may reference images not displayed]

FINDINGS: CT CHEST FINDINGS

Cardiovascular: No significant vascular findings. Normal heart size.
No pericardial effusion.

Mediastinum/Nodes: No enlarged mediastinal, hilar, or axillary lymph
nodes. Soft tissue in the anterior mediastinum, consistent with
thymus. Thyroid gland, trachea, and esophagus demonstrate no
significant findings.

Lungs/Pleura: Lungs are clear. No pleural effusion or pneumothorax.

Musculoskeletal: No chest wall mass or suspicious bone lesions
identified.

CT ABDOMEN PELVIS FINDINGS

Hepatobiliary: No solid liver abnormality is seen. No gallstones,
gallbladder wall thickening, or biliary dilatation.

Pancreas: Unremarkable. No pancreatic ductal dilatation or
surrounding inflammatory changes.

Spleen: Normal in size without significant abnormality.

Adrenals/Urinary Tract: Adrenal glands are unremarkable. Kidneys are
normal, without renal calculi, solid lesion, or hydronephrosis.
Bladder is unremarkable.

Stomach/Bowel: Stomach is within normal limits. Appendix appears
normal. No evidence of bowel wall thickening, distention, or
inflammatory changes. Large burden of stool in the colon.

Vascular/Lymphatic: No significant vascular findings are present. No
enlarged abdominal or pelvic lymph nodes. No change in tiny pelvic
sidewall and iliac lymph nodes (e.g. Series 2, image 108).

Reproductive: No mass or other abnormality.

Other: No abdominal wall hernia or abnormality. No abdominopelvic
ascites.

Musculoskeletal: No acute or significant osseous findings.
IMPRESSION: 1. Unchanged post treatment appearance of the cervix and pelvis,
with no change in tiny pelvic sidewall and iliac lymph nodes,
previously enlarged and hypermetabolic.

2. No evidence of metastatic disease in the chest, abdomen, or
pelvis.

3. No specific findings in the abdomen or pelvis to explain left
lower quadrant pain. Large burden of stool in the colon.

4. Soft tissue in the anterior mediastinum, consistent with thymus
and likely reflecting thymic rebound in the setting of recent
chemotherapy.

## 2019-10-12 ENCOUNTER — Other Ambulatory Visit: Payer: Self-pay | Admitting: *Deleted

## 2019-10-12 MED ORDER — DULOXETINE HCL 30 MG PO CPEP: ORAL_CAPSULE | ORAL | 3 refills | Status: DC

## 2019-10-17 ENCOUNTER — Encounter: Payer: Self-pay | Admitting: Family Medicine

## 2019-11-03 ENCOUNTER — Telehealth: Payer: Self-pay | Admitting: *Deleted

## 2019-11-30 ENCOUNTER — Other Ambulatory Visit: Payer: Self-pay | Admitting: *Deleted

## 2019-11-30 DIAGNOSIS — Z8541 Personal history of malignant neoplasm of cervix uteri: Secondary | ICD-10-CM

## 2019-12-01 ENCOUNTER — Other Ambulatory Visit: Payer: Self-pay

## 2019-12-01 ENCOUNTER — Inpatient Hospital Stay: Payer: Medicaid Other | Attending: Oncology

## 2019-12-01 ENCOUNTER — Encounter: Payer: Self-pay | Admitting: Oncology

## 2019-12-01 ENCOUNTER — Inpatient Hospital Stay (HOSPITAL_BASED_OUTPATIENT_CLINIC_OR_DEPARTMENT_OTHER): Payer: Medicaid Other | Admitting: Oncology

## 2019-12-01 VITALS — BP 134/99 | HR 72 | Temp 97.7°F | Resp 16 | Ht 67.0 in | Wt 279.7 lb

## 2019-12-01 DIAGNOSIS — N3020 Other chronic cystitis without hematuria: Secondary | ICD-10-CM | POA: Insufficient documentation

## 2019-12-01 DIAGNOSIS — R102 Pelvic and perineal pain: Secondary | ICD-10-CM | POA: Insufficient documentation

## 2019-12-01 DIAGNOSIS — Z8541 Personal history of malignant neoplasm of cervix uteri: Secondary | ICD-10-CM | POA: Diagnosis not present

## 2019-12-01 DIAGNOSIS — F17210 Nicotine dependence, cigarettes, uncomplicated: Secondary | ICD-10-CM | POA: Diagnosis not present

## 2019-12-01 DIAGNOSIS — C539 Malignant neoplasm of cervix uteri, unspecified: Secondary | ICD-10-CM | POA: Insufficient documentation

## 2019-12-01 DIAGNOSIS — Z08 Encounter for follow-up examination after completed treatment for malignant neoplasm: Secondary | ICD-10-CM

## 2019-12-01 LAB — CBC WITH DIFFERENTIAL/PLATELET
Abs Immature Granulocytes: 0.03 10*3/uL (ref 0.00–0.07)
Basophils Absolute: 0.1 10*3/uL (ref 0.0–0.1)
Basophils Relative: 1 %
Eosinophils Absolute: 0.1 10*3/uL (ref 0.0–0.5)
Eosinophils Relative: 1 %
HCT: 37.6 % (ref 36.0–46.0)
Hemoglobin: 13.6 g/dL (ref 12.0–15.0)
Immature Granulocytes: 0 %
Lymphocytes Relative: 36 %
Lymphs Abs: 3.2 10*3/uL (ref 0.7–4.0)
MCH: 32.3 pg (ref 26.0–34.0)
MCHC: 36.2 g/dL — ABNORMAL HIGH (ref 30.0–36.0)
MCV: 89.3 fL (ref 80.0–100.0)
Monocytes Absolute: 0.8 10*3/uL (ref 0.1–1.0)
Monocytes Relative: 9 %
Neutro Abs: 4.6 10*3/uL (ref 1.7–7.7)
Neutrophils Relative %: 53 %
Platelets: 379 10*3/uL (ref 150–400)
RBC: 4.21 MIL/uL (ref 3.87–5.11)
RDW: 12.8 % (ref 11.5–15.5)
WBC: 8.8 10*3/uL (ref 4.0–10.5)
nRBC: 0 % (ref 0.0–0.2)

## 2019-12-01 LAB — COMPREHENSIVE METABOLIC PANEL
ALT: 20 U/L (ref 0–44)
AST: 23 U/L (ref 15–41)
Albumin: 4 g/dL (ref 3.5–5.0)
Alkaline Phosphatase: 67 U/L (ref 38–126)
Anion gap: 8 (ref 5–15)
BUN: 9 mg/dL (ref 6–20)
CO2: 26 mmol/L (ref 22–32)
Calcium: 8.7 mg/dL — ABNORMAL LOW (ref 8.9–10.3)
Chloride: 102 mmol/L (ref 98–111)
Creatinine, Ser: 0.8 mg/dL (ref 0.44–1.00)
GFR calc Af Amer: >60 mL/min (ref >60)
GFR calc non Af Amer: >60 mL/min (ref >60)
Glucose, Bld: 96 mg/dL (ref 70–99)
Potassium: 4.1 mmol/L (ref 3.5–5.1)
Sodium: 136 mmol/L (ref 135–145)
Total Bilirubin: 0.5 mg/dL (ref 0.3–1.2)
Total Protein: 7.6 g/dL (ref 6.5–8.1)

## 2019-12-01 MED ORDER — HEPARIN SOD (PORK) LOCK FLUSH 100 UNIT/ML IV SOLN
INTRAVENOUS | Status: AC
  Filled 2019-12-01: qty 5

## 2019-12-01 MED ORDER — HEPARIN SOD (PORK) LOCK FLUSH 100 UNIT/ML IV SOLN
500.0000 [IU] | Freq: Once | INTRAVENOUS | Status: AC
  Administered 2019-12-01: 500 [IU] via INTRAVENOUS
  Filled 2019-12-01: qty 5

## 2019-12-01 MED ORDER — SODIUM CHLORIDE 0.9% FLUSH
10.0000 mL | INTRAVENOUS | Status: DC | PRN
  Administered 2019-12-01: 10 mL via INTRAVENOUS
  Filled 2019-12-01: qty 10

## 2019-12-01 NOTE — Progress Notes (Signed)
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 Positive pelvic LN s/p concurrent chemoradiation currently in remission  Chief complaint/ Reason for visit-routine follow-up of cervical cancer  Heme/Onc history: patient is a 34 year old female G30 who initially presented to the ER on 03/15/2018 with symptoms of abdominal pain and cramping. She has also been having irregular menstrual bleeding and spotting on and off for the last few months.She had an ultrasound pelvis done in the ER which showed a large 5.8 cm hypoechoic vascular mass within the cervix and the lower uterine segment. Patient had last seen GYN in 2013 and had not had a Pap smear done since then. Patient was seen by GYN Dr. Nechama Guard on 1213 and was found to have firm friable cervix and multiple biopsies were taken which showed adenocarcinoma. She was then seen by Dr. Fransisca Connors from GYN oncology. Pelvic exam showed anteverted cervix that was replaced by tumor and extension of cancer into the left parametrium and possibly to the pelvic sidewall.  PET CT scan on 03/28/2018 showed 6 cm hypermetabolic cervical mass consistent with primary cervical carcinoma. Mild hypermetabolic bilateral parametrial right perirectal bilateral iliac lymph nodes consistent with metastatic disease. No evidence of metastatic disease within the abdomen chest or neck.  Cycle 1 of  weekly cisplatin and radiation started on 04/11/2018. She completed chemoradiation on 2/13/2020followed by vaginal brachii therapy. PET CT scan thereafter did not show any evidence of metastatic disease and interval resolution of hypermetabolic pelvic adenopathy and no hypermetabolism seen in the area of the cervix.  Patient also follows up with urology for issues of pelvic pain urinary frequency.  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   Interval history-patient continues to have chronic lower abdominal pain from radiation cystitis and was recently seen by [facility] urology as well.She had a repeat cystoscopy at that time.  She remains off narcotics and takes as needed Tylenol for her abdominal pain.  She was seen by Dr. Theora Gianotti recently and given her ongoing concerns of abdominal pain the PET CT scan has been scheduled for next month.  She has received her Covid vaccine  ECOG PS- 1 Pain scale- 3 Opioid associated constipation- no  Review of systems- Review of Systems  Constitutional: Negative for chills, fever, malaise/fatigue and weight loss.  HENT: Negative for congestion, ear discharge and nosebleeds.   Eyes: Negative for blurred vision.  Respiratory: Negative for cough, hemoptysis, sputum production, shortness of breath and wheezing.   Cardiovascular: Negative for chest pain, palpitations, orthopnea and claudication.  Gastrointestinal: Positive for abdominal pain. Negative for blood in stool, constipation, diarrhea, heartburn, melena, nausea and vomiting.  Genitourinary: Negative for dysuria, flank pain, frequency, hematuria and urgency.  Musculoskeletal: Negative for back pain, joint pain and myalgias.  Skin: Negative for rash.  Neurological: Negative for dizziness, tingling, focal weakness, seizures, weakness and headaches.  Endo/Heme/Allergies: Does not bruise/bleed easily.  Psychiatric/Behavioral: Negative for depression and suicidal ideas. The  patient does not have  insomnia.       Allergies  Allergen Reactions  . Amoxicillin Rash    Did it involve swelling of the face/tongue/throat, SOB, or low BP? No Did it involve sudden or severe rash/hives, skin peeling, or any reaction on the inside of your mouth or nose? No Did you need to seek medical attention at a hospital or doctor's office? No When did it last happen?Childhood If all above answers are "NO", may proceed with cephalosporin use.   Marland Kitchen Penicillins Hives    Did it involve swelling of the face/tongue/throat, SOB, or low BP? No Did it involve sudden or severe rash/hives, skin peeling, or any reaction on the inside of your mouth or nose? No Did you need to seek medical attention at a hospital or doctor's office? No When did it last happen?Childhood If all above answers are "NO", may proceed with cephalosporin use.      Past Medical History:  Diagnosis Date  . Anxiety   . Cervical cancer ([place]) 03/2018  . Hashimoto's thyroiditis   . 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 History of cervical cancer   . Hypothyroid   . Low back pain      Past Surgical History:  Procedure Laterality Date  . BUNIONECTOMY Right   . PORTA CATH INSERTION N/A 03/31/2018   Procedure: PORTA CATH INSERTION;  Surgeon: Algernon Huxley, MD;  Location: [place] [facility];  Service: Cardiovascular;  Laterality: N/A;  . TONSILLECTOMY    . TRANSURETHRAL RESECTION OF BLADDER TUMOR N/A 06/20/2019   Procedure: TRANSURETHRAL RESECTION OF BLADDER TUMOR (TURBT);  Surgeon: [name], MD;  Location: [facility];  Service: Urology;  Laterality: N/A;    Social History   Socioeconomic History  . Marital status: Married    Spouse name: Not on file  . Number of children: Not on file  . Years of education: Not on file  . Highest education level: Not on file  Occupational History  . Not on file  Tobacco Use  . Smoking status: Current Some Day Smoker    Packs/day: 0.15    Years:  10.00    Pack years: 1.50    Types: Cigarettes  . Smokeless tobacco: Never Used  . Tobacco comment: 2 weeks to use a pack  Vaping Use  . Vaping Use: Some days  Substance and Sexual Activity  . Alcohol use: Yes    Comment: rare  . Drug use: Yes    Types: Marijuana    Comment: quit opiates 5 years ago  . Sexual activity: Yes    Birth control/protection: None  Other Topics Concern  . Not on file  Social History Narrative  . Not on file   Social Determinants of Health   Financial Resource Strain:   . Difficulty of Paying Living Expenses: Not on file  Food Insecurity:   . Worried About Charity fundraiser in the Last Year: Not on file  . Ran Out of Food in the Last Year: Not on file  Transportation Needs:   . Lack of Transportation (Medical): Not on file  . Lack of Transportation (Non-Medical): Not on file  Physical Activity:   . Days of Exercise per Week: Not on file  . Minutes of Exercise per Session: Not on file  Stress:   . Feeling of Stress : Not on file  Social Connections:   . Frequency of Communication with Friends and Family: Not on file  . Frequency of  Social Gatherings with Friends and Family: Not on file  . Attends Religious Services: Not on file  . Active Member of Clubs or Organizations: Not on file  . Attends Archivist Meetings: Not on file  . Marital Status: Not on file  Intimate Partner Violence:   . Fear of Current or Ex-Partner: Not on file  . Emotionally Abused: Not on file  . Physically Abused: Not on file  . Sexually Abused: Not on file    Family History  Adopted: Yes  Family history unknown: Yes     Current Outpatient Medications:  .  ALPRAZolam (XANAX) 1 MG tablet, Take 1 tablet (1 mg total) by mouth 3 (three) times daily as needed for anxiety., Disp: 90 tablet, Rfl: 2 .  amphetamine-dextroamphetamine (ADDERALL) 10 MG tablet, Take 1 tablet (10 mg total) by mouth daily as needed., Disp: 60 tablet, Rfl: 0 .   amphetamine-dextroamphetamine (ADDERALL) 10 MG tablet, Take 1 tablet (10 mg total) by mouth daily as needed., Disp: 60 tablet, Rfl: 0 .  amphetamine-dextroamphetamine (ADDERALL) 10 MG tablet, Take 1 tablet (10 mg total) by mouth daily as needed., Disp: 60 tablet, Rfl: 0 .  DULoxetine (CYMBALTA) 30 MG capsule, 2 capsules daily, Disp: 60 capsule, Rfl: 3 .  estradiol (VIVELLE-DOT) 0.1 MG/24HR patch, Place 1 patch (0.1 mg total) onto the skin 2 (two) times a week., Disp: 8 patch, Rfl: 12 .  gabapentin (NEURONTIN) 300 MG capsule, TAKE 1 CAPSULE BY MOUTH THREE TIMES DAILY, Disp: 90 capsule, Rfl: 0 .  lansoprazole (PREVACID) 30 MG capsule, Take 1 capsule (30 mg total) by mouth daily., Disp: 30 capsule, Rfl: 5 .  levothyroxine (SYNTHROID) 175 MCG tablet, Take 1 tablet (175 mcg total) by mouth daily before breakfast., Disp: 90 tablet, Rfl: 1 .  lidocaine-prilocaine (EMLA) cream, Apply to affected area once, Disp: 30 g, Rfl: 3 .  METHADONE HCL PO, Take 15 mg by mouth daily at 6 (six) AM. , Disp: , Rfl:  .  ondansetron (ZOFRAN) 8 MG tablet, Take 8 mg by mouth every 8 (eight) hours as needed for nausea or vomiting., Disp: , Rfl:  .  oxybutynin (DITROPAN XL) 15 MG 24 hr tablet, Take 1 tablet (15 mg total) by mouth daily., Disp: 30 tablet, Rfl: 3 .  progesterone (PROMETRIUM) 200 MG capsule, Take 200 mg by mouth at bedtime with food for 12 days sequentially per 28 day cycle., Disp: 12 capsule, Rfl: 11 .  promethazine (PHENERGAN) 25 MG tablet, Take 0.5-1 tablets (12.5-25 mg total) by mouth every 12 (twelve) hours as needed for nausea or vomiting., Disp: 30 tablet, Rfl: 2 No current facility-administered medications for this visit.  Facility-Administered Medications Ordered in Other Visits:  .  sodium chloride flush (NS) 0.9 % injection 10 mL, 10 mL, Intravenous, PRN, Sindy Guadeloupe, MD, 10 mL at 04/15/18 0825  Physical exam:  Vitals:   12/01/19 1114  BP: (!) 134/99  Pulse: 72  Resp: 16  Temp: 97.7 F (36.5  C)  TempSrc: Oral  Weight: 279 lb 11.2 oz (126.9 kg)  Height: 5\' 7"  (1.702 m)   Physical Exam Constitutional:      General: She is not in acute distress. Cardiovascular:     Rate and Rhythm: Normal rate and regular rhythm.     Heart sounds: Normal heart sounds.  Pulmonary:     Effort: Pulmonary effort is normal.     Breath sounds: Normal breath sounds.  Abdominal:     General: Bowel sounds are  normal. There is no distension.     Palpations: Abdomen is soft.     Tenderness: There is no abdominal tenderness.  Skin:    General: Skin is warm and dry.  Neurological:     Mental Status: She is alert and oriented to person, place, and time.      CMP Latest Ref Rng & Units 12/01/2019  Glucose 70 - 99 mg/dL 96  BUN 6 - 20 mg/dL 9  Creatinine 0.44 - 1.00 mg/dL 0.80  Sodium 135 - 145 mmol/L 136  Potassium 3.5 - 5.1 mmol/L 4.1  Chloride 98 - 111 mmol/L 102  CO2 22 - 32 mmol/L 26  Calcium 8.9 - 10.3 mg/dL 8.7(L)  Total Protein 6.5 - 8.1 g/dL 7.6  Total Bilirubin 0.3 - 1.2 mg/dL 0.5  Alkaline Phos 38 - 126 U/L 67  AST 15 - 41 U/L 23  ALT 0 - 44 U/L 20   CBC Latest Ref Rng & Units 12/01/2019  WBC 4.0 - 10.5 K/uL 8.8  Hemoglobin 12.0 - 15.0 g/dL 13.6  Hematocrit 36 - 46 % 37.6  Platelets 150 - 400 K/uL 379      Assessment and plan- Patient is a 34 y.o. female with adenocarcinoma of the cervix stage IIIc T2 N1 M0 s/p concurrent chemoradiation currently in remission.  This is a routine follow-up visit  1.  Patient's ongoing symptoms of abdominal pain seem to be chronic more from radiation cystitis.  However recurrent malignancy cannot be completely ruled out and she has a PET CT scan scheduled for next month.  She is scheduled to see GYN oncology following the PET scan.  I will see her back in January 2022 with labs  2.  Chronic pelvic pain:Secondary to radiation cystitis.  She is not currently taking any narcotics.  She takes as needed Tylenol  3.  Vasomotor symptoms: Currently  stable with hormone replacement therapy   Visit Diagnosis 1. Encounter for follow-up surveillance of cervical cancer      Dr. Randa Evens, MD, MPH [facility] at [facility] [phone] 12/01/2019 1:31 PM

## 2019-12-01 NOTE — Progress Notes (Signed)
Still having urinary and pain issues. She went to [facility] to a uro gyn specialist. She has scar tissue at the bladder so if she gets bloated or has full bladder it makes pain worse for her. She had cystoscopy last week and she urinated and it looked like a piece of tissue was in her urine. Eating and drinking good. BM ok. 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

## 2019-12-14 ENCOUNTER — Telehealth: Payer: Self-pay | Admitting: Obstetrics and Gynecology

## 2019-12-15 ENCOUNTER — Telehealth: Payer: Self-pay

## 2019-12-15 NOTE — Telephone Encounter (Signed)
Called and spoke with [facility] after she notified our office that her PET scan had been cancelled. We were not made aware of this cancellation. Her PET remains in authorization status. Once approved we will have it rescheduled and notify her of the new date. 9/15 follow up with gyn oncology left for this moment. We will reschedule this if PET is not performed before this date. Apologized for this inconvenience.

## 2019-12-18 ENCOUNTER — Ambulatory Visit: Payer: Medicaid Other

## 2019-12-19 ENCOUNTER — Other Ambulatory Visit: Payer: Self-pay | Admitting: Oncology

## 2019-12-19 ENCOUNTER — Encounter: Payer: Self-pay | Admitting: Oncology

## 2019-12-19 ENCOUNTER — Other Ambulatory Visit: Payer: Self-pay | Admitting: Family Medicine

## 2019-12-19 DIAGNOSIS — F988 Other specified behavioral and emotional disorders with onset usually occurring in childhood and adolescence: Secondary | ICD-10-CM

## 2019-12-19 MED ORDER — AMPHETAMINE-DEXTROAMPHETAMINE 10 MG PO TABS: 10.0000 mg | ORAL_TABLET | Freq: Every day | ORAL | 0 refills | Status: DC | PRN

## 2019-12-19 MED ORDER — GABAPENTIN 300 MG PO CAPS: 300.0000 mg | ORAL_CAPSULE | Freq: Three times a day (TID) | ORAL | 0 refills | Status: DC

## 2019-12-19 NOTE — Telephone Encounter (Signed)
Please advise 

## 2019-12-20 ENCOUNTER — Inpatient Hospital Stay: Payer: Medicaid Other

## 2019-12-21 ENCOUNTER — Other Ambulatory Visit: Payer: Self-pay | Admitting: *Deleted

## 2019-12-21 ENCOUNTER — Telehealth: Payer: Self-pay | Admitting: *Deleted

## 2019-12-21 DIAGNOSIS — Z8541 Personal history of malignant neoplasm of cervix uteri: Secondary | ICD-10-CM

## 2019-12-21 NOTE — Telephone Encounter (Signed)
I did check on her PET appeal this morning. Per authorization the PET appeal can take up to 15 days.

## 2019-12-21 NOTE — Telephone Encounter (Signed)
Will put order in for CT of chest , abdomen and pelvis per Beckey Rutter NP to be scheduled as soon as possible. Per [facility] Auth# [id number] CT Abd/pelvis 90379 Auth# [id number] CT Chest/71260 Both valid 12/21/19-02/04/20

## 2019-12-21 NOTE — Telephone Encounter (Signed)
Kristy Gordon- if pet was denied can we see if ct chest abdomen pelvis can be done to begin with given the symptoms?

## 2019-12-21 NOTE — Telephone Encounter (Signed)
Let's plan to see her in [facility] with labs and agree with ct.

## 2019-12-21 NOTE — Telephone Encounter (Signed)
I called pt to see if she would be in agreement to set appt for [facility] to discuss health concerns and setting up possible CT scan as PET is not yet authorized. Pt is working today. Requested appt tomorrow morning at 9am.

## 2019-12-21 NOTE — Telephone Encounter (Signed)
Spoke to [place]. We're going to go ahead and order the ct scan and try to get that approved and scheduled for tomorrow then I'll see her as well.

## 2019-12-21 NOTE — Telephone Encounter (Signed)
Pt was called to inform her of her CT scan tomorrow at 2pm at the [facility] on [address]. Informed pt she could pick up oral contrast at the [facility] mall at [facility] until 8pm this evening or she could pick it up right after her appt w/ [facility] in am. She will need to be NPO starting at 10 am tomorrow for the scan.

## 2019-12-21 NOTE — Telephone Encounter (Signed)
Wyona Almas called and left voice mail reporting that patient is not doing well, that she s losing weight and that her PET has not been rescheduled (I spoke with [place] who said it had been denied and is in the process of being appealed). He states he is worried about her and feels she needs something done and that the PET needs to be ordered, again stating she is not doing well (did not give specifics)  I called and spoke with Devin for more specifics and he states that she has lost 40 pounds this past month. She is eating and drinking well without nausea or vomiting and having normal bowel movements and voiding ok. He states that she is weak and pale like when she first had cancer; and that she has the pain back in her left side where the cancer was. Can you please touch base with her [phone]

## 2019-12-22 ENCOUNTER — Inpatient Hospital Stay: Payer: Medicaid Other | Attending: Nurse Practitioner | Admitting: Nurse Practitioner

## 2019-12-22 ENCOUNTER — Ambulatory Visit: Admission: RE | Admit: 2019-12-22 | Payer: Medicaid Other | Source: Ambulatory Visit

## 2019-12-22 ENCOUNTER — Encounter: Payer: Self-pay | Admitting: Nurse Practitioner

## 2019-12-26 ENCOUNTER — Ambulatory Visit: Payer: Medicaid Other

## 2020-01-01 ENCOUNTER — Ambulatory Visit
Admission: RE | Admit: 2020-01-01 | Discharge: 2020-01-01 | Disposition: A | Discharge status: Home / Self Care | Payer: Medicaid Other | Source: Ambulatory Visit | Attending: Nurse Practitioner | Admitting: Nurse Practitioner

## 2020-01-01 ENCOUNTER — Other Ambulatory Visit: Payer: Self-pay

## 2020-01-01 DIAGNOSIS — Z8541 Personal history of malignant neoplasm of cervix uteri: Secondary | ICD-10-CM | POA: Insufficient documentation

## 2020-01-01 DIAGNOSIS — C539 Malignant neoplasm of cervix uteri, unspecified: Secondary | ICD-10-CM | POA: Diagnosis not present

## 2020-01-01 DIAGNOSIS — R160 Hepatomegaly, not elsewhere classified: Secondary | ICD-10-CM | POA: Diagnosis not present

## 2020-01-01 DIAGNOSIS — R634 Abnormal weight loss: Secondary | ICD-10-CM | POA: Diagnosis not present

## 2020-01-01 MED ORDER — IOHEXOL 300 MG/ML  SOLN
125.0000 mL | Freq: Once | INTRAMUSCULAR | Status: AC | PRN
  Administered 2020-01-01: 100 mL via INTRAVENOUS

## 2020-01-04 ENCOUNTER — Telehealth: Payer: Self-pay | Admitting: Nurse Practitioner

## 2020-01-04 DIAGNOSIS — C539 Malignant neoplasm of cervix uteri, unspecified: Secondary | ICD-10-CM

## 2020-01-04 NOTE — Telephone Encounter (Signed)
Called patient to review CT scan which is similar to prior and no obvious malignancy. Patient reports weight loss of approximately 30-40 lbs unintentionally recently and feeling of malaise. She continues to have chronic pain secondary to radiation induced cystitis. Followed by urology at [facility]. Will get patient f/u appt with Dr. Janese Banks to discuss symptoms or can see her in Symptom Management for acute symptoms.

## 2020-01-22 ENCOUNTER — Other Ambulatory Visit: Payer: Self-pay

## 2020-01-22 ENCOUNTER — Inpatient Hospital Stay: Payer: Medicaid Other | Attending: Oncology | Admitting: Oncology

## 2020-01-22 ENCOUNTER — Encounter: Payer: Self-pay | Admitting: Oncology

## 2020-01-22 VITALS — BP 140/91 | HR 79 | Temp 97.6°F | Resp 18 | Wt 273.3 lb

## 2020-01-22 DIAGNOSIS — R109 Unspecified abdominal pain: Secondary | ICD-10-CM | POA: Diagnosis not present

## 2020-01-22 DIAGNOSIS — F17210 Nicotine dependence, cigarettes, uncomplicated: Secondary | ICD-10-CM | POA: Diagnosis not present

## 2020-01-22 DIAGNOSIS — N939 Abnormal uterine and vaginal bleeding, unspecified: Secondary | ICD-10-CM | POA: Insufficient documentation

## 2020-01-22 DIAGNOSIS — Z08 Encounter for follow-up examination after completed treatment for malignant neoplasm: Secondary | ICD-10-CM

## 2020-01-22 DIAGNOSIS — Z8541 Personal history of malignant neoplasm of cervix uteri: Secondary | ICD-10-CM | POA: Diagnosis not present

## 2020-01-22 DIAGNOSIS — C539 Malignant neoplasm of cervix uteri, unspecified: Secondary | ICD-10-CM | POA: Diagnosis present

## 2020-01-22 NOTE — Progress Notes (Signed)
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 Positive pelvic LNs/p concurrent chemoradiation currently in remission  Chief complaint/ Reason for visit-discuss CT scan results and further management  Heme/Onc history: patient is a 34 year old female G74 who initially presented to the ER on 03/15/2018 with symptoms of abdominal pain and cramping. She has also been having irregular menstrual bleeding and spotting on and off for the last few months.She had an ultrasound pelvis done in the ER which showed a large 5.8 cm hypoechoic vascular mass within the cervix and the lower uterine segment. Patient had last seen GYN in 2013 and had not had a Pap smear done since then. Patient was seen by GYN Dr. Nechama Guard on 1213 and was found to have firm friable cervix and multiple biopsies were taken which showed adenocarcinoma. She was then seen by Dr. Fransisca Connors from GYN oncology. Pelvic exam showed anteverted cervix that was replaced by tumor and extension of cancer into the left parametrium and possibly to the pelvic sidewall.  PET CT scan on 03/28/2018 showed 6 cm hypermetabolic cervical mass consistent with primary cervical carcinoma. Mild hypermetabolic bilateral parametrial right perirectal bilateral iliac lymph nodes consistent with metastatic disease. No evidence of metastatic disease within the abdomen chest or  neck.  Cycle 1 of weekly cisplatin and radiation started on 04/11/2018. She completed chemoradiation on 2/13/2020followed by vaginal brachii therapy. PET CT scan thereafter did not show any evidence of metastatic disease and interval resolution of hypermetabolic pelvic adenopathy and no hypermetabolism seen in the area of the cervix.  Patient also follows up with urology for issues of pelvic pain urinary frequency. 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  Interval history-patient has chronic abdominal pain and reports intermittent vaginal bleeding.  Appetite and weight have otherwise remained stable.  She uses Tylenol as needed for abdominal pain  ECOG PS- 1 Pain scale- 4   Review of systems- Review of Systems  Constitutional: Positive for malaise/fatigue. Negative for chills, fever and weight loss.  HENT: Negative for congestion, ear discharge and nosebleeds.   Eyes: Negative for blurred vision.  Respiratory: Negative for cough, hemoptysis, sputum production, shortness of breath and wheezing.   Cardiovascular: Negative for chest pain, palpitations, orthopnea and claudication.  Gastrointestinal: Positive for abdominal pain. Negative for blood in stool, constipation, diarrhea, heartburn, melena, nausea and vomiting.  Genitourinary: Negative for dysuria, flank pain, frequency, hematuria and urgency.  Musculoskeletal: Negative for back pain, joint pain and myalgias.  Skin: Negative for rash.  Neurological: Negative for dizziness, tingling, focal weakness, seizures, weakness and headaches.  Endo/Heme/Allergies: Does not bruise/bleed easily.  Psychiatric/Behavioral: Negative for depression and suicidal ideas. The patient does not have insomnia.       Allergies  Allergen Reactions  . Amoxicillin Rash    Did it involve swelling of the face/tongue/throat, SOB, or low BP? No Did it involve sudden or severe rash/hives, skin peeling, or any reaction  on the inside of your mouth or  nose? No Did you need to seek medical attention at a hospital or doctor's office? No When did it last happen?Childhood If all above answers are "NO", may proceed with cephalosporin use.   Marland Kitchen Penicillins Hives    Did it involve swelling of the face/tongue/throat, SOB, or low BP? No Did it involve sudden or severe rash/hives, skin peeling, or any reaction on the inside of your mouth or nose? No Did you need to seek medical attention at a hospital or doctor's office? No When did it last happen?Childhood If all above answers are "NO", may proceed with cephalosporin use.      Past Medical History:  Diagnosis Date  . Anxiety   . Cervical cancer ([place]) 03/2018  . Hashimoto's thyroiditis   . 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 History of cervical cancer   . Hypothyroid   . Low back pain      Past Surgical History:  Procedure Laterality Date  . BUNIONECTOMY Right   . PORTA CATH INSERTION N/A 03/31/2018   Procedure: PORTA CATH INSERTION;  Surgeon: Algernon Huxley, MD;  Location: [facility];  Service: Cardiovascular;  Laterality: N/A;  . TONSILLECTOMY    . TRANSURETHRAL RESECTION OF BLADDER TUMOR N/A 06/20/2019   Procedure: TRANSURETHRAL RESECTION OF BLADDER TUMOR (TURBT);  Surgeon: [name], MD;  Location: [facility];  Service: Urology;  Laterality: N/A;    Social History   Socioeconomic History  . Marital status: Married    Spouse name: Not on file  . Number of children: Not on file  . Years of education: Not on file  . Highest education level: Not on file  Occupational History  . Not on file  Tobacco Use  . Smoking status: Current Some Day Smoker    Packs/day: 0.15    Years: 10.00    Pack years: 1.50    Types: Cigarettes  . Smokeless tobacco: Never Used  . Tobacco comment: 2 weeks to use a pack  Vaping Use  . Vaping Use: Some days  Substance and Sexual Activity  . Alcohol use: Yes    Comment: rare  . Drug  use: Yes    Types: Marijuana    Comment: quit opiates 5 years ago  . Sexual activity: Yes    Birth control/protection: None  Other Topics Concern  . Not on file  Social History Narrative  . Not on file   Social Determinants of Health   Financial Resource Strain:   . Difficulty of Paying Living Expenses: Not on file  Food Insecurity:   . Worried About Charity fundraiser in the Last Year: Not on file  . Ran Out of Food in the Last Year: Not on file  Transportation Needs:   . Lack of Transportation (Medical): Not on file  . Lack of Transportation (Non-Medical): Not on file  Physical Activity:   . Days of Exercise per Week: Not on file  . Minutes of Exercise per Session: Not on file  Stress:   . Feeling of Stress : Not on file  Social Connections:   . Frequency of Communication with Friends and Family: Not on file  . Frequency of Social Gatherings with Friends and Family: Not on file  . Attends Religious Services: Not on file  . Active Member of Clubs or Organizations: Not on file  . Attends Archivist Meetings: Not on file  . Marital Status: Not on file  Intimate Partner Violence:   .  Fear of Current or Ex-Partner: Not on file  . Emotionally Abused: Not on file  . Physically Abused: Not on file  . Sexually Abused: Not on file    Family History  Adopted: Yes  Family history unknown: Yes     Current Outpatient Medications:  .  ALPRAZolam (XANAX) 1 MG tablet, Take 1 tablet (1 mg total) by mouth 3 (three) times daily as needed for anxiety., Disp: 90 tablet, Rfl: 2 .  amphetamine-dextroamphetamine (ADDERALL) 10 MG tablet, Take 1 tablet (10 mg total) by mouth daily as needed., Disp: 60 tablet, Rfl: 0 .  DULoxetine (CYMBALTA) 30 MG capsule, 2 capsules daily, Disp: 60 capsule, Rfl: 3 .  estradiol (VIVELLE-DOT) 0.1 MG/24HR patch, Place 1 patch (0.1 mg total) onto the skin 2 (two) times a week., Disp: 8 patch, Rfl: 12 .  gabapentin (NEURONTIN) 300 MG capsule, Take 1  capsule (300 mg total) by mouth 3 (three) times daily., Disp: 90 capsule, Rfl: 0 .  lansoprazole (PREVACID) 30 MG capsule, Take 1 capsule (30 mg total) by mouth daily., Disp: 30 capsule, Rfl: 5 .  levothyroxine (SYNTHROID) 175 MCG tablet, Take 1 tablet (175 mcg total) by mouth daily before breakfast., Disp: 90 tablet, Rfl: 1 .  METHADONE HCL PO, Take 15 mg by mouth daily at 6 (six) AM. , Disp: , Rfl:  .  ondansetron (ZOFRAN) 8 MG tablet, Take 8 mg by mouth every 8 (eight) hours as needed for nausea or vomiting., Disp: , Rfl:  .  oxybutynin (DITROPAN XL) 15 MG 24 hr tablet, Take 1 tablet (15 mg total) by mouth daily., Disp: 30 tablet, Rfl: 3 .  progesterone (PROMETRIUM) 200 MG capsule, Take 200 mg by mouth at bedtime with food for 12 days sequentially per 28 day cycle., Disp: 12 capsule, Rfl: 11 .  promethazine (PHENERGAN) 25 MG tablet, Take 0.5-1 tablets (12.5-25 mg total) by mouth every 12 (twelve) hours as needed for nausea or vomiting., Disp: 30 tablet, Rfl: 2 .  lidocaine-prilocaine (EMLA) cream, Apply to affected area once (Patient not taking: Reported on 01/22/2020), Disp: 30 g, Rfl: 3 No current facility-administered medications for this visit.  Facility-Administered Medications Ordered in Other Visits:  .  sodium chloride flush (NS) 0.9 % injection 10 mL, 10 mL, Intravenous, PRN, Sindy Guadeloupe, MD, 10 mL at 04/15/18 0825  Physical exam:  Vitals:   01/22/20 1142  BP: (!) 140/91  Pulse: 79  Resp: 18  Temp: 97.6 F (36.4 C)  TempSrc: Tympanic  SpO2: 97%  Weight: 273 lb 4.8 oz (124 kg)   Physical Exam Constitutional:      General: She is not in acute distress. Cardiovascular:     Rate and Rhythm: Normal rate and regular rhythm.     Heart sounds: Normal heart sounds.  Pulmonary:     Effort: Pulmonary effort is normal.     Breath sounds: Normal breath sounds.  Abdominal:     General: Bowel sounds are normal. There is no distension.     Palpations: Abdomen is soft.      Tenderness: There is no abdominal tenderness.  Skin:    General: Skin is warm and dry.  Neurological:     Mental Status: She is alert and oriented to person, place, and time.      CMP Latest Ref Rng & Units 12/01/2019  Glucose 70 - 99 mg/dL 96  BUN 6 - 20 mg/dL 9  Creatinine 0.44 - 1.00 mg/dL 0.80  Sodium 135 - 145 mmol/L  136  Potassium 3.5 - 5.1 mmol/L 4.1  Chloride 98 - 111 mmol/L 102  CO2 22 - 32 mmol/L 26  Calcium 8.9 - 10.3 mg/dL 8.7(L)  Total Protein 6.5 - 8.1 g/dL 7.6  Total Bilirubin 0.3 - 1.2 mg/dL 0.5  Alkaline Phos 38 - 126 U/L 67  AST 15 - 41 U/L 23  ALT 0 - 44 U/L 20   CBC Latest Ref Rng & Units 12/01/2019  WBC 4.0 - 10.5 K/uL 8.8  Hemoglobin 12.0 - 15.0 g/dL 13.6  Hematocrit 36 - 46 % 37.6  Platelets 150 - 400 K/uL 379    No images are attached to the encounter.  CT CHEST ABDOMEN PELVIS W CONTRAST  Result Date: 01/02/2020 CLINICAL DATA:  Cervical cancer restaging, status post chemotherapy and radiation therapy, pain and weight loss EXAM: CT CHEST, ABDOMEN, AND PELVIS WITH CONTRAST TECHNIQUE: Multidetector CT imaging of the chest, abdomen and pelvis was performed following the standard protocol during bolus administration of intravenous contrast. CONTRAST:  145mL OMNIPAQUE IOHEXOL 300 MG/ML SOLN, additional oral enteric contrast COMPARISON:  CT abdomen pelvis, 06/09/2019, PET-CT, 02/20/2019 FINDINGS: CT CHEST FINDINGS Cardiovascular: Right chest port catheter. Normal heart size. No pericardial effusion. Mediastinum/Nodes: No enlarged mediastinal, hilar, or axillary lymph nodes. Minimal thymic remnant in the anterior mediastinum. Thyroid gland, trachea, and esophagus demonstrate no significant findings. Lungs/Pleura: Lungs are clear. No pleural effusion or pneumothorax. Musculoskeletal: No chest wall mass or suspicious bone lesions identified. CT ABDOMEN PELVIS FINDINGS Hepatobiliary: No solid liver abnormality is seen. Hepatomegaly. No gallstones, gallbladder wall  thickening, or biliary dilatation. Pancreas: Unremarkable. No pancreatic ductal dilatation or surrounding inflammatory changes. Spleen: Normal in size without significant abnormality. Adrenals/Urinary Tract: Adrenal glands are unremarkable. Kidneys are normal, without renal calculi, solid lesion, or hydronephrosis. Bladder is unremarkable. Stomach/Bowel: Stomach is within normal limits. Appendix appears normal. No evidence of bowel wall thickening, distention, or inflammatory changes. Vascular/Lymphatic: No significant vascular findings are present. No enlarged abdominal or pelvic lymph nodes. Reproductive: No mass or other abnormality. Minimal fat stranding in the pelvis similar to prior examination (series 2, image 112). Other: No abdominal wall hernia or abnormality. No abdominopelvic ascites. Musculoskeletal: No acute or significant osseous findings. IMPRESSION: 1. Minimal fat stranding in the pelvis similar to prior examination, likely related to prior radiation therapy. No evidence of mass or lymphadenopathy. 2. No evidence of metastatic disease in the chest, abdomen, or pelvis. 3. Hepatomegaly. Electronically Signed   By: Eddie Candle M.D.   On: 01/02/2020 10:35     Assessment and plan- Patient is a 34 y.o. female with adenocarcinoma of the cervix stage IIIc T2 N1 M0 s/p concurrent chemoradiation currently in remission.    She is here to discuss CT scan results  CT chest abdomen and pelvis was done when she had a flareup of her abdominal pain as well as ongoing vaginal bleeding.  CT scan again does not show any evidence of recurrent disease.  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  I have reassured the patient regarding CT scan results.  I will see her in January as scheduled   Visit Diagnosis 1. Encounter for follow-up surveillance of cervical cancer      Dr. Randa Evens, MD, MPH [facility] at [facility] [phone] 01/22/2020 4:16 PM

## 2020-01-28 ENCOUNTER — Other Ambulatory Visit: Payer: Self-pay | Admitting: Family Medicine

## 2020-01-28 DIAGNOSIS — F988 Other specified behavioral and emotional disorders with onset usually occurring in childhood and adolescence: Secondary | ICD-10-CM

## 2020-01-29 MED ORDER — AMPHETAMINE-DEXTROAMPHETAMINE 10 MG PO TABS: 10.0000 mg | ORAL_TABLET | Freq: Every day | ORAL | 0 refills | Status: DC | PRN

## 2020-01-30 ENCOUNTER — Other Ambulatory Visit: Payer: Self-pay | Admitting: Family Medicine

## 2020-01-30 ENCOUNTER — Other Ambulatory Visit: Payer: Self-pay | Admitting: Oncology

## 2020-01-30 ENCOUNTER — Other Ambulatory Visit: Payer: Self-pay | Admitting: Nurse Practitioner

## 2020-01-30 DIAGNOSIS — Z79890 Hormone replacement therapy (postmenopausal): Secondary | ICD-10-CM

## 2020-01-30 DIAGNOSIS — E28319 Asymptomatic premature menopause: Secondary | ICD-10-CM

## 2020-01-30 DIAGNOSIS — C538 Malignant neoplasm of overlapping sites of cervix uteri: Secondary | ICD-10-CM

## 2020-01-30 DIAGNOSIS — E038 Other specified hypothyroidism: Secondary | ICD-10-CM

## 2020-02-02 ENCOUNTER — Other Ambulatory Visit: Payer: Self-pay | Admitting: Family Medicine

## 2020-02-02 NOTE — Telephone Encounter (Signed)
Requested medication (s) are due for refill today: no  Requested medication (s) are on the active medication list: yes  Last refill: 12/15/2019  Future visit scheduled: no  Notes to clinic:  this refill cannot be delegated   Requested Prescriptions  Pending Prescriptions Disp Refills   ALPRAZolam (XANAX) 1 MG tablet [Pharmacy Med Name: ALPRAZOLAM 1 MG TAB] 90 tablet     Sig: TAKE ONE TABLET BY MOUTH 3 TIMES DAILY AS NEEDED      Not Delegated - Psychiatry:  Anxiolytics/Hypnotics Failed - 02/02/2020 12:51 PM      Failed - This refill cannot be delegated      Passed - Urine Drug Screen completed in last 360 days      Passed - Valid encounter within last 6 months    Recent Outpatient Visits           4 months ago Attention deficit disorder (ADD) without hyperactivity   [facility] [name], MD   8 months ago Attention deficit disorder (ADD) without hyperactivity   [facility] [name], MD   10 months ago Attention deficit disorder, unspecified hyperactivity presence   [facility] [name], MD   12 months ago Teague Bremond, Wendee Beavers, [place]   1 year ago Other specified hypothyroidism   [facility] [name], MD

## 2020-02-10 ENCOUNTER — Other Ambulatory Visit: Payer: Self-pay | Admitting: Nurse Practitioner

## 2020-02-10 DIAGNOSIS — Z79890 Hormone replacement therapy (postmenopausal): Secondary | ICD-10-CM

## 2020-02-10 DIAGNOSIS — E28319 Asymptomatic premature menopause: Secondary | ICD-10-CM

## 2020-02-13 ENCOUNTER — Other Ambulatory Visit: Payer: Self-pay | Admitting: *Deleted

## 2020-02-14 ENCOUNTER — Encounter: Payer: Self-pay | Admitting: Oncology

## 2020-02-16 ENCOUNTER — Other Ambulatory Visit: Payer: Self-pay

## 2020-02-16 ENCOUNTER — Inpatient Hospital Stay: Payer: Medicaid Other | Attending: Oncology

## 2020-02-16 DIAGNOSIS — Z452 Encounter for adjustment and management of vascular access device: Secondary | ICD-10-CM | POA: Diagnosis present

## 2020-02-16 DIAGNOSIS — Z95828 Presence of other vascular implants and grafts: Secondary | ICD-10-CM

## 2020-02-16 DIAGNOSIS — C539 Malignant neoplasm of cervix uteri, unspecified: Secondary | ICD-10-CM | POA: Diagnosis present

## 2020-02-16 MED ORDER — HEPARIN SOD (PORK) LOCK FLUSH 100 UNIT/ML IV SOLN
500.0000 [IU] | Freq: Once | INTRAVENOUS | Status: AC
  Administered 2020-02-16: 500 [IU] via INTRAVENOUS
  Filled 2020-02-16: qty 5

## 2020-02-16 MED ORDER — SODIUM CHLORIDE 0.9% FLUSH
10.0000 mL | Freq: Once | INTRAVENOUS | Status: AC
  Administered 2020-02-16: 10 mL via INTRAVENOUS
  Filled 2020-02-16: qty 10

## 2020-02-16 MED ORDER — HEPARIN SOD (PORK) LOCK FLUSH 100 UNIT/ML IV SOLN
INTRAVENOUS | Status: AC
  Filled 2020-02-16: qty 5

## 2020-03-04 ENCOUNTER — Other Ambulatory Visit: Payer: Self-pay | Admitting: Oncology

## 2020-03-04 ENCOUNTER — Other Ambulatory Visit: Payer: Self-pay | Admitting: Radiation Oncology

## 2020-03-11 ENCOUNTER — Encounter: Payer: Self-pay | Admitting: Oncology

## 2020-03-12 ENCOUNTER — Encounter: Payer: Self-pay | Admitting: Family Medicine

## 2020-03-20 ENCOUNTER — Telehealth: Payer: Self-pay | Admitting: Nurse Practitioner

## 2020-03-20 DIAGNOSIS — R112 Nausea with vomiting, unspecified: Secondary | ICD-10-CM

## 2020-03-20 NOTE — Telephone Encounter (Signed)
Left patient vm to follow up re: Kristy Gordon. I see that she has appt with Dr. Rosanna Randy. Would consider ref to GI, possible CT scan abd/pelvis.

## 2020-04-09 ENCOUNTER — Other Ambulatory Visit: Payer: Self-pay | Admitting: Oncology

## 2020-04-18 ENCOUNTER — Encounter: Payer: Self-pay | Admitting: Family Medicine

## 2020-04-23 ENCOUNTER — Ambulatory Visit (INDEPENDENT_AMBULATORY_CARE_PROVIDER_SITE_OTHER): Payer: Medicaid Other | Admitting: Family Medicine

## 2020-04-23 ENCOUNTER — Other Ambulatory Visit: Payer: Self-pay

## 2020-04-23 ENCOUNTER — Encounter: Payer: Self-pay | Admitting: Family Medicine

## 2020-04-23 ENCOUNTER — Ambulatory Visit
Admission: RE | Admit: 2020-04-23 | Discharge: 2020-04-23 | Disposition: A | Discharge status: Home / Self Care | Payer: Medicaid Other | Source: Ambulatory Visit | Attending: Family Medicine | Admitting: Family Medicine

## 2020-04-23 ENCOUNTER — Ambulatory Visit
Admission: RE | Admit: 2020-04-23 | Discharge: 2020-04-23 | Disposition: A | Discharge status: Home / Self Care | Payer: Medicaid Other | Attending: Family Medicine | Admitting: Family Medicine

## 2020-04-23 VITALS — BP 134/89 | HR 63 | Temp 98.6°F | Resp 16 | Ht 67.0 in | Wt 270.0 lb

## 2020-04-23 DIAGNOSIS — E038 Other specified hypothyroidism: Secondary | ICD-10-CM | POA: Diagnosis not present

## 2020-04-23 DIAGNOSIS — M79641 Pain in right hand: Secondary | ICD-10-CM

## 2020-04-23 DIAGNOSIS — F988 Other specified behavioral and emotional disorders with onset usually occurring in childhood and adolescence: Secondary | ICD-10-CM

## 2020-04-23 DIAGNOSIS — M79644 Pain in right finger(s): Secondary | ICD-10-CM | POA: Diagnosis not present

## 2020-04-23 MED ORDER — AMPHETAMINE-DEXTROAMPHETAMINE 10 MG PO TABS: 10.0000 mg | ORAL_TABLET | Freq: Every day | ORAL | 0 refills | Status: DC | PRN

## 2020-04-23 MED ORDER — LEVOTHYROXINE SODIUM 175 MCG PO TABS: 175.0000 ug | ORAL_TABLET | Freq: Every day | ORAL | 0 refills | Status: DC

## 2020-04-23 NOTE — Progress Notes (Signed)
Established patient visit   Patient: Kristy Gordon   DOB: 25-Oct-1985   35 y.o. Female  MRN: [id number] Visit Date: 04/23/2020  Today's healthcare provider: Wilhemena Durie, MD   Chief Complaint  Patient presents with  . thumb pain   Subjective    HPI  Thumb pain Patient reports that she has had symptoms more than 1 month. She does not remember any type of injuries. She denies any swelling or redness. She has not taken anything OTC for symptoms. Pain is worse when moving it around. Nothing makes the pain feel better.  The area on the thumb is really only tender when she hits it. The swelling is in the medial part of the thumb just proximal to the DIP.      Medications: Outpatient Medications Prior to Visit  Medication Sig  . ALPRAZolam (XANAX) 1 MG tablet TAKE ONE TABLET BY MOUTH 3 TIMES DAILY AS NEEDED  . amphetamine-dextroamphetamine (ADDERALL) 10 MG tablet Take 1 tablet (10 mg total) by mouth daily as needed.  . DULoxetine (CYMBALTA) 30 MG capsule Take 2 capsules by mouth once daily  . estradiol (VIVELLE-DOT) 0.1 MG/24HR patch APPLY 1 PATCH TOPICALLY TWICE A WEEK  . gabapentin (NEURONTIN) 300 MG capsule TAKE 1 CAPSULE BY MOUTH THREE TIMES DAILY  . lansoprazole (PREVACID) 30 MG capsule Take 1 capsule by mouth once daily  . levothyroxine (SYNTHROID) 175 MCG tablet TAKE 1 TABLET BY MOUTH ONCE DAILY IN THE MORNING BEFORE  BREAKFAST  . METHADONE HCL PO Take 15 mg by mouth daily at 6 (six) AM.   . oxybutynin (DITROPAN XL) 15 MG 24 hr tablet Take 1 tablet (15 mg total) by mouth daily.  . progesterone (PROMETRIUM) 200 MG capsule TAKE ONE CAPSULE BY MOUTH AT BEDTIME WITH FOOD FOR 12 DAYS SEQUENTIALLY PER 28 DAY CYCLE  . promethazine (PHENERGAN) 25 MG tablet Take 0.5-1 tablets (12.5-25 mg total) by mouth every 12 (twelve) hours as needed for nausea or vomiting.  . lidocaine-prilocaine (EMLA) cream Apply to affected area once (Patient not taking: No sig reported)  . ondansetron  (ZOFRAN) 8 MG tablet Take 8 mg by mouth every 8 (eight) hours as needed for nausea or vomiting. (Patient not taking: Reported on 04/23/2020)   Facility-Administered Medications Prior to Visit  Medication Dose Route Frequency Provider  . sodium chloride flush (NS) 0.9 % injection 10 mL  10 mL Intravenous PRN Sindy Guadeloupe, MD    Review of Systems  Constitutional: Negative for activity change.  Musculoskeletal: Positive for arthralgias. Negative for joint swelling and myalgias.  Skin: Negative for color change, pallor, rash and wound.  Hematological: Does not bruise/bleed easily.       Objective    BP 134/89   Pulse 63   Temp 98.6 F (37 C)   Resp 16   Ht 5\' 7"  (1.702 m)   Wt 270 lb (122.5 kg)   BMI 42.29 kg/m  Wt Readings from Last 3 Encounters:  04/23/20 270 lb (122.5 kg)  01/22/20 273 lb 4.8 oz (124 kg)  12/01/19 279 lb 11.2 oz (126.9 kg)     Thickening/growth of medial thumb just proximal to DIP.     No results found for any visits on 04/23/20.  Assessment & Plan     1. Hand pain, right I think this is a benign growth.  We will x-ray to look for pathology.  May need referral to hand surgeon.  Patient wishes to wait on this. -  DG Hand Complete Right; Future  2. Pain of right thumb Only has pain when she hits this area of medial thumb.  3. Attention deficit disorder, unspecified hyperactivity presence Refill meds.  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  Dispense: 60 tablet; Refill: 0  4. Other specified hypothyroidism  - levothyroxine (SYNTHROID) 175 MCG tablet; Take 1 tablet (175 mcg total) by mouth daily before breakfast.  Dispense: 90 tablet; Refill: 0   No follow-ups on file.      I, Wilhemena Durie, MD, have reviewed all documentation for this visit. The documentation on 04/28/20 for the exam, diagnosis, procedures, and orders  are all accurate and complete.    Loni Delbridge Cranford Mon, MD  [facility] [phone] (phone) [phone] (fax)  [place]

## 2020-04-24 DIAGNOSIS — C539 Malignant neoplasm of cervix uteri, unspecified: Secondary | ICD-10-CM | POA: Diagnosis not present

## 2020-04-24 DIAGNOSIS — R198 Other specified symptoms and signs involving the digestive system and abdomen: Secondary | ICD-10-CM | POA: Insufficient documentation

## 2020-04-29 ENCOUNTER — Other Ambulatory Visit: Payer: Self-pay | Admitting: *Deleted

## 2020-04-29 DIAGNOSIS — Z8541 Personal history of malignant neoplasm of cervix uteri: Secondary | ICD-10-CM

## 2020-04-30 ENCOUNTER — Other Ambulatory Visit: Payer: Self-pay

## 2020-04-30 ENCOUNTER — Inpatient Hospital Stay: Payer: Medicaid Other | Attending: Oncology

## 2020-04-30 ENCOUNTER — Inpatient Hospital Stay: Payer: Medicaid Other | Admitting: Oncology

## 2020-04-30 ENCOUNTER — Telehealth: Payer: Self-pay | Admitting: Oncology

## 2020-04-30 DIAGNOSIS — Z79890 Hormone replacement therapy (postmenopausal): Secondary | ICD-10-CM

## 2020-04-30 DIAGNOSIS — E28319 Asymptomatic premature menopause: Secondary | ICD-10-CM

## 2020-04-30 DIAGNOSIS — C538 Malignant neoplasm of overlapping sites of cervix uteri: Secondary | ICD-10-CM

## 2020-04-30 MED ORDER — ESTRADIOL 0.1 MG/24HR TD PTTW: MEDICATED_PATCH | TRANSDERMAL | 3 refills | Status: DC

## 2020-04-30 NOTE — Telephone Encounter (Signed)
called Pt in regards to missed appointment, left VM for pt to call back to reschedule.

## 2020-04-30 NOTE — Telephone Encounter (Signed)
Lauren, please consider refill.

## 2020-05-02 ENCOUNTER — Other Ambulatory Visit: Payer: Self-pay

## 2020-05-02 ENCOUNTER — Encounter: Payer: Self-pay | Admitting: Family Medicine

## 2020-05-02 ENCOUNTER — Ambulatory Visit (INDEPENDENT_AMBULATORY_CARE_PROVIDER_SITE_OTHER): Payer: Medicaid Other | Admitting: Family Medicine

## 2020-05-02 VITALS — BP 113/70 | HR 72 | Temp 97.6°F | Resp 16 | Wt 270.0 lb

## 2020-05-02 DIAGNOSIS — N3090 Cystitis, unspecified without hematuria: Secondary | ICD-10-CM | POA: Diagnosis not present

## 2020-05-02 DIAGNOSIS — R350 Frequency of micturition: Secondary | ICD-10-CM

## 2020-05-02 DIAGNOSIS — N3040 Irradiation cystitis without hematuria: Secondary | ICD-10-CM | POA: Diagnosis not present

## 2020-05-02 DIAGNOSIS — C539 Malignant neoplasm of cervix uteri, unspecified: Secondary | ICD-10-CM

## 2020-05-02 DIAGNOSIS — F988 Other specified behavioral and emotional disorders with onset usually occurring in childhood and adolescence: Secondary | ICD-10-CM | POA: Diagnosis not present

## 2020-05-02 DIAGNOSIS — Z6841 Body Mass Index (BMI) 40.0 and over, adult: Secondary | ICD-10-CM

## 2020-05-02 LAB — POCT URINALYSIS DIPSTICK
Bilirubin, UA: NEGATIVE
Blood, UA: NEGATIVE
Glucose, UA: NEGATIVE
Ketones, UA: NEGATIVE
Leukocytes, UA: NEGATIVE
Nitrite, UA: NEGATIVE
Protein, UA: NEGATIVE
Spec Grav, UA: 1.02 (ref 1.010–1.025)
Urobilinogen, UA: 0.2 E.U./dL
pH, UA: 6.5 (ref 5.0–8.0)

## 2020-05-02 MED ORDER — NITROFURANTOIN MONOHYD MACRO 100 MG PO CAPS: 100.0000 mg | ORAL_CAPSULE | Freq: Two times a day (BID) | ORAL | 1 refills | Status: DC

## 2020-05-02 MED ORDER — SOLIFENACIN SUCCINATE 5 MG PO TABS: 5.0000 mg | ORAL_TABLET | Freq: Every day | ORAL | 5 refills | Status: DC

## 2020-05-02 NOTE — Progress Notes (Signed)
I,[name],acting as a scribe for Wilhemena Durie, MD.,have documented all relevant documentation on the behalf of Wilhemena Durie, MD,as directed by  Wilhemena Durie, MD while in the presence of Wilhemena Durie, MD.   Established patient visit   Patient: Kristy Gordon   DOB: 07/13/1985   35 y.o. Female  MRN: [id number] Visit Date: 05/02/2020  Today's healthcare provider: Wilhemena Durie, MD   Chief Complaint  Patient presents with  . Urinary Frequency  . Urinary Urgency   Subjective    Urinary Frequency  This is a new problem. The current episode started in the past 7 days (2 days). The problem occurs intermittently. The problem has been unchanged. The quality of the pain is described as aching. The pain is at a severity of 5/10. The pain is moderate. The maximum temperature recorded prior to her arrival was 100 - 100.9 F. The fever has been present for less than 1 day. Associated symptoms include chills, flank pain, frequency, hesitancy, nausea and urgency. Pertinent negatives include no discharge, hematuria, possible pregnancy, sweats or vomiting. She has tried acetaminophen for the symptoms. The treatment provided no relief.    Patient has had urine frequency and urgency for the past 2 days. Patient also has symptoms of abdominal pain, low back pain, flank pain, elevated temperature, hesitancy and nausea. Patient has been treating with Aleve with no relief.  Patient has a history of radiation cystitis from her treatment for metastatic cervical cancer which is in complete remission      Medications: Outpatient Medications Prior to Visit  Medication Sig  . ALPRAZolam (XANAX) 1 MG tablet TAKE ONE TABLET BY MOUTH 3 TIMES DAILY AS NEEDED  . amphetamine-dextroamphetamine (ADDERALL) 10 MG tablet Take 1 tablet (10 mg total) by mouth daily as needed.  Marland Kitchen amphetamine-dextroamphetamine (ADDERALL) 10 MG tablet Take 1 tablet (10 mg total) by mouth daily as needed.  Marland Kitchen  amphetamine-dextroamphetamine (ADDERALL) 10 MG tablet Take 1 tablet (10 mg total) by mouth daily as needed.  . DULoxetine (CYMBALTA) 30 MG capsule Take 2 capsules by mouth once daily  . estradiol (VIVELLE-DOT) 0.1 MG/24HR patch APPLY 1 PATCH TOPICALLY TWICE A WEEK  . gabapentin (NEURONTIN) 300 MG capsule TAKE 1 CAPSULE BY MOUTH THREE TIMES DAILY  . lansoprazole (PREVACID) 30 MG capsule Take 1 capsule by mouth once daily  . levothyroxine (SYNTHROID) 175 MCG tablet Take 1 tablet (175 mcg total) by mouth daily before breakfast.  . lidocaine-prilocaine (EMLA) cream Apply to affected area once  . oxybutynin (DITROPAN XL) 15 MG 24 hr tablet Take 1 tablet (15 mg total) by mouth daily.  . progesterone (PROMETRIUM) 200 MG capsule TAKE ONE CAPSULE BY MOUTH AT BEDTIME WITH FOOD FOR 12 DAYS SEQUENTIALLY PER 28 DAY CYCLE  . promethazine (PHENERGAN) 25 MG tablet Take 0.5-1 tablets (12.5-25 mg total) by mouth every 12 (twelve) hours as needed for nausea or vomiting.  Marland Kitchen METHADONE HCL PO Take 15 mg by mouth daily at 6 (six) AM.  (Patient not taking: Reported on 05/02/2020)  . ondansetron (ZOFRAN) 8 MG tablet Take 8 mg by mouth every 8 (eight) hours as needed for nausea or vomiting. (Patient not taking: No sig reported)   Facility-Administered Medications Prior to Visit  Medication Dose Route Frequency Provider  . sodium chloride flush (NS) 0.9 % injection 10 mL  10 mL Intravenous PRN Sindy Guadeloupe, MD    Review of Systems  Constitutional: Positive for chills. Negative for appetite  change, fatigue and fever.  Respiratory: Negative for chest tightness and shortness of breath.   Cardiovascular: Negative for chest pain and palpitations.  Gastrointestinal: Positive for nausea. Negative for abdominal pain and vomiting.  Genitourinary: Positive for flank pain, frequency, hesitancy and urgency. Negative for hematuria.  Musculoskeletal: Positive for back pain.  Neurological: Negative for dizziness and weakness.         Objective    BP 113/70 (BP Location: Right Arm, Patient Position: Sitting, Cuff Size: Large)   Pulse 72   Temp 97.6 F (36.4 C) (Oral)   Resp 16   Wt 270 lb (122.5 kg)   LMP  (LMP Unknown)   SpO2 97%   BMI 42.29 kg/m  Wt Readings from Last 3 Encounters:  05/02/20 270 lb (122.5 kg)  04/23/20 270 lb (122.5 kg)  01/22/20 273 lb 4.8 oz (124 kg)       Physical Exam Vitals reviewed.  Constitutional:      Appearance: She is obese.  HENT:     Right Ear: External ear normal.     Left Ear: External ear normal.  Eyes:     General: No scleral icterus. Neck:     Comments: Goiter present.,stable. Cardiovascular:     Rate and Rhythm: Normal rate and regular rhythm.     Heart sounds: Normal heart sounds.  Pulmonary:     Effort: Pulmonary effort is normal.     Breath sounds: Normal breath sounds.  Abdominal:     Palpations: Abdomen is soft.     Comments: No CVA tenderness or suprapubic tenderness  Musculoskeletal:     Right lower leg: No edema.     Left lower leg: No edema.  Skin:    General: Skin is warm and dry.  Neurological:     General: No focal deficit present.     Mental Status: She is alert and oriented to person, place, and time.  Psychiatric:        Mood and Affect: Mood normal.        Behavior: Behavior normal.        Thought Content: Thought content normal.        Judgment: Judgment normal.       No results found for any visits on 05/02/20.  Assessment & Plan     1. Frequency of urination I think this is radiation cystitis.  Obtain urine culture - POCT urinalysis dipstick  2. Cystitis Cover for cystitis with 3 days of nitrofurantoin pending culture - CULTURE, URINE COMPREHENSIVE  3. Chronic radiation cystitis  - CULTURE, URINE COMPREHENSIVE  4. Class 3 severe obesity due to excess calories without serious comorbidity with body mass index (BMI) of 45.0 to 49.9 in adult ([place])   5. Attention deficit disorder, unspecified hyperactivity  presence   6. Malignant neoplasm of cervix, unspecified site [facility]) Followed by oncology and radiation therapy   No follow-ups on file.      I, Wilhemena Durie, MD, have reviewed all documentation for this visit. The documentation on 05/06/20 for the exam, diagnosis, procedures, and orders are all accurate and complete.    Lovetta Condie Cranford Mon, MD  [facility] [phone] (phone) [phone] (fax)  [place]

## 2020-05-02 NOTE — Patient Instructions (Signed)
Stop oxybutynin.  Push fluids.

## 2020-05-07 LAB — CULTURE, URINE COMPREHENSIVE

## 2020-05-11 ENCOUNTER — Other Ambulatory Visit: Payer: Self-pay | Admitting: Oncology

## 2020-05-14 ENCOUNTER — Encounter: Payer: Self-pay | Admitting: Family Medicine

## 2020-05-21 DIAGNOSIS — K449 Diaphragmatic hernia without obstruction or gangrene: Secondary | ICD-10-CM | POA: Diagnosis not present

## 2020-05-21 DIAGNOSIS — R112 Nausea with vomiting, unspecified: Secondary | ICD-10-CM | POA: Diagnosis not present

## 2020-06-04 ENCOUNTER — Other Ambulatory Visit: Payer: Self-pay | Admitting: Family Medicine

## 2020-06-04 NOTE — Telephone Encounter (Signed)
Requested medication (s) are due for refill today: yes  Requested medication (s) are on the active medication list: yes  Last refill:  05/01/2020  Future visit scheduled:yes  Notes to clinic:  this refill cannot be delegated   Requested Prescriptions  Pending Prescriptions Disp Refills   ALPRAZolam (XANAX) 1 MG tablet [Pharmacy Med Name: ALPRAZOLAM 1 MG TAB] 90 tablet     Sig: TAKE ONE TABLET BY MOUTH 3 TIMES DAILY AS NEEDED      Not Delegated - Psychiatry:  Anxiolytics/Hypnotics Failed - 06/04/2020  8:06 AM      Failed - This refill cannot be delegated      Passed - Urine Drug Screen completed in last 360 days      Passed - Valid encounter within last 6 months    Recent Outpatient Visits           1 month ago Frequency of urination   [facility] [name], MD   1 month ago Hand pain, right   [facility] [name], MD   8 months ago Attention deficit disorder (ADD) without hyperactivity   [facility] [name], MD   1 year ago Attention deficit disorder (ADD) without hyperactivity   [facility] [name], MD   1 year ago Attention deficit disorder, unspecified hyperactivity presence   [facility] [name], MD       Future Appointments             In 2 months [name], MD [facility], [place]

## 2020-06-06 ENCOUNTER — Other Ambulatory Visit: Payer: Self-pay | Admitting: Family Medicine

## 2020-06-06 ENCOUNTER — Other Ambulatory Visit: Payer: Self-pay | Admitting: Oncology

## 2020-06-06 DIAGNOSIS — E038 Other specified hypothyroidism: Secondary | ICD-10-CM

## 2020-06-10 ENCOUNTER — Other Ambulatory Visit: Payer: Self-pay

## 2020-06-10 ENCOUNTER — Inpatient Hospital Stay: Payer: Medicaid Other | Attending: Oncology

## 2020-06-10 DIAGNOSIS — Z452 Encounter for adjustment and management of vascular access device: Secondary | ICD-10-CM | POA: Insufficient documentation

## 2020-06-10 DIAGNOSIS — C539 Malignant neoplasm of cervix uteri, unspecified: Secondary | ICD-10-CM | POA: Insufficient documentation

## 2020-06-10 DIAGNOSIS — Z95828 Presence of other vascular implants and grafts: Secondary | ICD-10-CM

## 2020-06-10 MED ORDER — HEPARIN SOD (PORK) LOCK FLUSH 100 UNIT/ML IV SOLN
INTRAVENOUS | Status: AC
  Filled 2020-06-10: qty 5

## 2020-06-10 MED ORDER — HEPARIN SOD (PORK) LOCK FLUSH 100 UNIT/ML IV SOLN
500.0000 [IU] | Freq: Once | INTRAVENOUS | Status: AC
  Administered 2020-06-10: 500 [IU] via INTRAVENOUS
  Filled 2020-06-10: qty 5

## 2020-06-10 MED ORDER — SODIUM CHLORIDE 0.9% FLUSH
10.0000 mL | INTRAVENOUS | Status: DC | PRN
  Administered 2020-06-10: 10 mL via INTRAVENOUS
  Filled 2020-06-10: qty 10

## 2020-06-18 ENCOUNTER — Encounter: Payer: Self-pay | Admitting: Family Medicine

## 2020-06-19 ENCOUNTER — Encounter: Payer: Self-pay | Admitting: Family Medicine

## 2020-06-19 ENCOUNTER — Other Ambulatory Visit: Payer: Self-pay

## 2020-06-19 ENCOUNTER — Ambulatory Visit (INDEPENDENT_AMBULATORY_CARE_PROVIDER_SITE_OTHER): Payer: Medicaid Other | Admitting: Family Medicine

## 2020-06-19 VITALS — BP 117/79 | HR 85 | Temp 97.0°F | Resp 16 | Wt 269.0 lb

## 2020-06-19 DIAGNOSIS — Z6841 Body Mass Index (BMI) 40.0 and over, adult: Secondary | ICD-10-CM | POA: Diagnosis not present

## 2020-06-19 DIAGNOSIS — M79644 Pain in right finger(s): Secondary | ICD-10-CM | POA: Diagnosis not present

## 2020-06-19 DIAGNOSIS — N3040 Irradiation cystitis without hematuria: Secondary | ICD-10-CM

## 2020-06-19 DIAGNOSIS — C539 Malignant neoplasm of cervix uteri, unspecified: Secondary | ICD-10-CM

## 2020-06-19 DIAGNOSIS — F988 Other specified behavioral and emotional disorders with onset usually occurring in childhood and adolescence: Secondary | ICD-10-CM | POA: Diagnosis not present

## 2020-06-19 NOTE — Progress Notes (Signed)
I,[name],acting as a scribe for Wilhemena Durie, MD.,have documented all relevant documentation on the behalf of Wilhemena Durie, MD,as directed by  Wilhemena Durie, MD while in the presence of Wilhemena Durie, MD.   Established patient visit   Patient: Kristy Gordon   DOB: 1986/04/02   35 y.o. Female  MRN: [id number] Visit Date: 06/19/2020  Today's healthcare provider: Wilhemena Durie, MD   Chief Complaint  Patient presents with  . Follow-up   Subjective    HPI  Patient is to follow up on left thumb protrusion. X-ray obtained.  His only bothers her because she has to hold an instrument in her hand and it causes discomfort because of the growth on her thumb. Because of her new work schedule she would like to try to increase her Adderall if possible as it runs out after lunch.  Does help her during the morning.  Attention deficit disorder, unspecified hyperactivity presence From 04/23/2020-Refill meds.  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   Patient wants to discuss ADD medication.      Medications: Outpatient Medications Prior to Visit  Medication Sig  . ALPRAZolam (XANAX) 1 MG tablet TAKE ONE TABLET BY MOUTH 3 TIMES DAILY AS NEEDED  . amphetamine-dextroamphetamine (ADDERALL) 10 MG tablet Take 1 tablet (10 mg total) by mouth daily as needed.  Marland Kitchen amphetamine-dextroamphetamine (ADDERALL) 10 MG tablet Take 1 tablet (10 mg total) by mouth daily as needed.  Marland Kitchen amphetamine-dextroamphetamine (ADDERALL) 10 MG tablet Take 1 tablet (10 mg total) by mouth daily as needed.  . DULoxetine (CYMBALTA) 30 MG capsule Take 2 capsules by mouth once daily  . estradiol (VIVELLE-DOT) 0.1 MG/24HR patch APPLY 1 PATCH TOPICALLY TWICE A WEEK  . gabapentin (NEURONTIN) 300 MG capsule TAKE 1 CAPSULE BY MOUTH THREE TIMES DAILY  . lansoprazole (PREVACID) 30 MG capsule Take 1 capsule by mouth once daily  . levothyroxine (SYNTHROID) 175 MCG tablet Take 1 tablet (175 mcg  total) by mouth daily before breakfast.  . lidocaine-prilocaine (EMLA) cream Apply to affected area once  . nitrofurantoin, macrocrystal-monohydrate, (MACROBID) 100 MG capsule Take 1 capsule (100 mg total) by mouth 2 (two) times daily.  Marland Kitchen oxybutynin (DITROPAN XL) 15 MG 24 hr tablet Take 1 tablet (15 mg total) by mouth daily.  . progesterone (PROMETRIUM) 200 MG capsule TAKE ONE CAPSULE BY MOUTH AT BEDTIME WITH FOOD FOR 12 DAYS SEQUENTIALLY PER 28 DAY CYCLE  . promethazine (PHENERGAN) 25 MG tablet Take 0.5-1 tablets (12.5-25 mg total) by mouth every 12 (twelve) hours as needed for nausea or vomiting.  . solifenacin (VESICARE) 5 MG tablet Take 1 tablet (5 mg total) by mouth daily.  Marland Kitchen METHADONE HCL PO Take 15 mg by mouth daily at 6 (six) AM.  (Patient not taking: No sig reported)  . ondansetron (ZOFRAN) 8 MG tablet Take 8 mg by mouth every 8 (eight) hours as needed for nausea or vomiting. (Patient not taking: No sig reported)   Facility-Administered Medications Prior to Visit  Medication Dose Route Frequency Provider  . sodium chloride flush (NS) 0.9 % injection 10 mL  10 mL Intravenous PRN Sindy Guadeloupe, MD    Review of Systems      Objective    BP 117/79 (BP Location: Right Arm, Patient Position: Sitting, Cuff Size: Large)   Pulse 85   Temp (!) 97 F (36.1 C) (Temporal)   Resp 16   Wt 269 lb (122  kg)   LMP  (LMP Unknown)   SpO2 98%   BMI 42.13 kg/m  Wt Readings from Last 3 Encounters:  06/19/20 269 lb (122 kg)  05/02/20 270 lb (122.5 kg)  04/23/20 270 lb (122.5 kg)       Physical Exam Vitals reviewed.  Constitutional:      Appearance: She is obese.  HENT:     Right Ear: External ear normal.     Left Ear: External ear normal.  Eyes:     General: No scleral icterus. Neck:     Comments: Goiter present.,stable. Cardiovascular:     Rate and Rhythm: Normal rate and regular rhythm.     Heart sounds: Normal heart sounds.  Pulmonary:     Effort: Pulmonary effort is normal.      Breath sounds: Normal breath sounds.  Abdominal:     Palpations: Abdomen is soft.     Comments: No CVA tenderness or suprapubic tenderness  Musculoskeletal:     Right lower leg: No edema.     Left lower leg: No edema.     Comments: There is a nontender firm growth in the medial right thumb.  Skin:    General: Skin is warm and dry.  Neurological:     General: No focal deficit present.     Mental Status: She is alert and oriented to person, place, and time.  Psychiatric:        Mood and Affect: Mood normal.        Behavior: Behavior normal.        Thought Content: Thought content normal.        Judgment: Judgment normal.       No results found for any visits on 06/19/20.  Assessment & Plan     1. Pain of right thumb X-ray and refer to orthopedic surgery. - AMB referral to orthopedics  2. Attention deficit disorder, unspecified hyperactivity presence Increase Adderall to twice daily as needed.  Follow-up in a couple of months. - amphetamine-dextroamphetamine (ADDERALL) 10 MG tablet; Take 1 tablet (10 mg total) by mouth 2 (two) times daily with a meal.  Dispense: 60 tablet; Refill: 0 - amphetamine-dextroamphetamine (ADDERALL) 10 MG tablet; Take 1 tablet (10 mg total) by mouth 2 (two) times daily with a meal.  Dispense: 60 tablet; Refill: 0 - amphetamine-dextroamphetamine (ADDERALL) 10 MG tablet; Take 1 tablet (10 mg total) by mouth 2 (two) times daily with a meal.  Dispense: 60 tablet; Refill: 0  3. Class 3 severe obesity due to excess calories without serious comorbidity with body mass index (BMI) of 45.0 to 49.9 in adult ([place])   4. Chronic radiation cystitis   5. Adenocarcinoma of cervix [facility]) Metastatic cervical cancer followed by oncology .  No follow-ups on file.      I, Wilhemena Durie, MD, have reviewed all documentation for this visit. The documentation on 06/23/20 for the exam, diagnosis, procedures, and orders are all accurate and  complete.    Adryel Wortmann Cranford Mon, MD  [facility] [phone] (phone) [phone] (fax)  [place]

## 2020-06-23 MED ORDER — AMPHETAMINE-DEXTROAMPHETAMINE 10 MG PO TABS: 10.0000 mg | ORAL_TABLET | Freq: Two times a day (BID) | ORAL | 0 refills | Status: DC

## 2020-06-24 ENCOUNTER — Ambulatory Visit: Payer: Self-pay | Admitting: Family Medicine

## 2020-06-27 ENCOUNTER — Encounter: Payer: Self-pay | Admitting: Family Medicine

## 2020-07-02 MED ORDER — AMPHETAMINE-DEXTROAMPHETAMINE 10 MG PO TABS: 10.0000 mg | ORAL_TABLET | Freq: Two times a day (BID) | ORAL | 0 refills | Status: DC

## 2020-07-02 NOTE — Addendum Note (Signed)
Addended by: Eulas Post on: 07/02/2020 04:21 PM   Modules accepted: Orders

## 2020-07-07 ENCOUNTER — Other Ambulatory Visit: Payer: Self-pay | Admitting: *Deleted

## 2020-07-07 DIAGNOSIS — Z8541 Personal history of malignant neoplasm of cervix uteri: Secondary | ICD-10-CM

## 2020-07-08 ENCOUNTER — Inpatient Hospital Stay: Payer: Medicaid Other | Admitting: Oncology

## 2020-07-08 ENCOUNTER — Telehealth: Payer: Self-pay | Admitting: Oncology

## 2020-07-08 ENCOUNTER — Inpatient Hospital Stay: Payer: Medicaid Other | Attending: Oncology

## 2020-07-08 NOTE — Telephone Encounter (Signed)
Left message for patient to call clinic and reschedule her lab/md visit scheduled for 07/08/20.

## 2020-07-14 ENCOUNTER — Other Ambulatory Visit: Payer: Self-pay | Admitting: Oncology

## 2020-07-15 ENCOUNTER — Encounter: Payer: Self-pay | Admitting: Oncology

## 2020-07-15 ENCOUNTER — Other Ambulatory Visit: Payer: Self-pay | Admitting: Family Medicine

## 2020-07-15 DIAGNOSIS — E038 Other specified hypothyroidism: Secondary | ICD-10-CM

## 2020-07-16 NOTE — Telephone Encounter (Signed)
Per Dr Janese Banks, patient has cancel her last 2 appointments with her and has no further appointments with our office. I called and spoke with husband who had called reporting that patient is having withdrawal symptoms, "brain zaps", crying all the time even in her sleep and unable to handle things for herself at this point. I explained to hm that if she is not going to see a doctor at our office that we cannot prescribe for her. He verbalized understanding and said he cannot make her decision on coming back, but he will discuss this with her and call me back.  Per husband [place], she has a problem with Dr Janese Banks and is trying to see about changing doctors but it is taking time due to having MCD. He is going to speak with her to see if she is willing to see Dr Janese Banks so that she can get prescription filled. He asked if she needs to see GYN Oncology, that she would probably have no problem with that and said she cancel her CT appts due to the [place] pandemic. We also discussed that if she chooses not to come to see Dr Janese Banks that he can ask her PCP if he would fill prescription for her and he also asked about her going to an Urgent Care or ER for a gap prescription until this can be resolved

## 2020-07-18 ENCOUNTER — Telehealth: Payer: Self-pay | Admitting: *Deleted

## 2020-07-18 ENCOUNTER — Telehealth: Payer: Self-pay | Admitting: Nurse Practitioner

## 2020-07-18 NOTE — Telephone Encounter (Signed)
Leaving a  Message for pt. Got a message from Kristy Gordon in gyn and she wanted me to call her and ask if pt wants to make appt to see gyn. I also heard from one of the folks in administration that pt wants to switch from rao to Dr. Tasia Catchings.. If you can give me a call I will get an appt for her.

## 2020-07-18 NOTE — Telephone Encounter (Signed)
Called patient to f/u re: her recent concerns, review surveillance recommendations, and reschedule surveillance visits. No answer. Left voicemail.

## 2020-07-18 NOTE — Telephone Encounter (Signed)
I called pt and left message if she would like to get r/s for GYN and I was given info that she would like to switch from [place] to [place]. If she could give me a call I will try my best to help her

## 2020-07-31 DIAGNOSIS — C539 Malignant neoplasm of cervix uteri, unspecified: Secondary | ICD-10-CM | POA: Diagnosis not present

## 2020-07-31 NOTE — Telephone Encounter (Signed)
I called the pt and left voicemail to see if she wanted to r/s missed appt for GYN or Dr. Janese Banks or both. Just let us know. Left telephone number to call back if she is interested

## 2020-08-12 ENCOUNTER — Encounter: Payer: Self-pay | Admitting: Oncology

## 2020-08-16 ENCOUNTER — Encounter: Payer: Self-pay | Admitting: *Deleted

## 2020-08-16 ENCOUNTER — Other Ambulatory Visit: Payer: Self-pay | Admitting: *Deleted

## 2020-08-16 MED ORDER — DULOXETINE HCL 60 MG PO CPEP: 60.0000 mg | ORAL_CAPSULE | Freq: Every day | ORAL | 1 refills | Status: DC

## 2020-08-19 ENCOUNTER — Inpatient Hospital Stay: Payer: Medicaid Other | Attending: Oncology | Admitting: Oncology

## 2020-08-19 ENCOUNTER — Inpatient Hospital Stay: Payer: Medicaid Other

## 2020-08-19 ENCOUNTER — Encounter: Payer: Self-pay | Admitting: Oncology

## 2020-08-19 ENCOUNTER — Other Ambulatory Visit: Payer: Self-pay | Admitting: *Deleted

## 2020-08-19 VITALS — BP 138/83 | HR 78 | Temp 98.0°F | Resp 16 | Ht 67.0 in | Wt 272.5 lb

## 2020-08-19 DIAGNOSIS — F17210 Nicotine dependence, cigarettes, uncomplicated: Secondary | ICD-10-CM | POA: Diagnosis not present

## 2020-08-19 DIAGNOSIS — G620 Drug-induced polyneuropathy: Secondary | ICD-10-CM | POA: Diagnosis not present

## 2020-08-19 DIAGNOSIS — T451X5A Adverse effect of antineoplastic and immunosuppressive drugs, initial encounter: Secondary | ICD-10-CM | POA: Diagnosis not present

## 2020-08-19 DIAGNOSIS — Z79899 Other long term (current) drug therapy: Secondary | ICD-10-CM | POA: Insufficient documentation

## 2020-08-19 DIAGNOSIS — Z8541 Personal history of malignant neoplasm of cervix uteri: Secondary | ICD-10-CM

## 2020-08-19 DIAGNOSIS — C539 Malignant neoplasm of cervix uteri, unspecified: Secondary | ICD-10-CM | POA: Insufficient documentation

## 2020-08-19 DIAGNOSIS — N941 Unspecified dyspareunia: Secondary | ICD-10-CM | POA: Diagnosis not present

## 2020-08-19 DIAGNOSIS — Z923 Personal history of irradiation: Secondary | ICD-10-CM | POA: Insufficient documentation

## 2020-08-19 DIAGNOSIS — Z08 Encounter for follow-up examination after completed treatment for malignant neoplasm: Secondary | ICD-10-CM | POA: Diagnosis not present

## 2020-08-19 DIAGNOSIS — N3040 Irradiation cystitis without hematuria: Secondary | ICD-10-CM | POA: Insufficient documentation

## 2020-08-19 LAB — CBC WITH DIFFERENTIAL/PLATELET
Abs Immature Granulocytes: 0.03 10*3/uL (ref 0.00–0.07)
Basophils Absolute: 0.1 10*3/uL (ref 0.0–0.1)
Basophils Relative: 1 %
Eosinophils Absolute: 0.1 10*3/uL (ref 0.0–0.5)
Eosinophils Relative: 1 %
HCT: 37.8 % (ref 36.0–46.0)
Hemoglobin: 13.4 g/dL (ref 12.0–15.0)
Immature Granulocytes: 0 %
Lymphocytes Relative: 40 %
Lymphs Abs: 3.7 10*3/uL (ref 0.7–4.0)
MCH: 32.3 pg (ref 26.0–34.0)
MCHC: 35.4 g/dL (ref 30.0–36.0)
MCV: 91.1 fL (ref 80.0–100.0)
Monocytes Absolute: 0.9 10*3/uL (ref 0.1–1.0)
Monocytes Relative: 9 %
Neutro Abs: 4.4 10*3/uL (ref 1.7–7.7)
Neutrophils Relative %: 49 %
Platelets: 355 10*3/uL (ref 150–400)
RBC: 4.15 MIL/uL (ref 3.87–5.11)
RDW: 12.5 % (ref 11.5–15.5)
WBC: 9.2 10*3/uL (ref 4.0–10.5)
nRBC: 0 % (ref 0.0–0.2)

## 2020-08-19 LAB — COMPREHENSIVE METABOLIC PANEL
ALT: 20 U/L (ref 0–44)
AST: 23 U/L (ref 15–41)
Albumin: 3.8 g/dL (ref 3.5–5.0)
Alkaline Phosphatase: 63 U/L (ref 38–126)
Anion gap: 10 (ref 5–15)
BUN: 11 mg/dL (ref 6–20)
CO2: 22 mmol/L (ref 22–32)
Calcium: 8.6 mg/dL — ABNORMAL LOW (ref 8.9–10.3)
Chloride: 104 mmol/L (ref 98–111)
Creatinine, Ser: 0.71 mg/dL (ref 0.44–1.00)
GFR, Estimated: >60 mL/min (ref >60)
Glucose, Bld: 94 mg/dL (ref 70–99)
Potassium: 3.9 mmol/L (ref 3.5–5.1)
Sodium: 136 mmol/L (ref 135–145)
Total Bilirubin: 0.4 mg/dL (ref 0.3–1.2)
Total Protein: 7.5 g/dL (ref 6.5–8.1)

## 2020-08-19 MED ORDER — GABAPENTIN 300 MG PO CAPS: 1.0000 | ORAL_CAPSULE | Freq: Three times a day (TID) | ORAL | 2 refills | Status: DC

## 2020-08-19 MED ORDER — OXYBUTYNIN CHLORIDE ER 15 MG PO TB24: 15.0000 mg | ORAL_TABLET | Freq: Every day | ORAL | 3 refills | Status: DC

## 2020-08-19 NOTE — Progress Notes (Signed)
Pt still has episodes of nausea and vomiting and she does not know when it is going to happen. Pt has the episode about 1 time a month. It lasts for 12 hours. She still has pain in uterus and pain. It is getting better. She can have flare ups and sometimes hot water bath, or heating pad helps. She does not take opioids any longer.

## 2020-08-19 NOTE — Progress Notes (Signed)
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 Positive pelvic LNs/p concurrent chemoradiation currently in remission   Chief complaint/ Reason for visit-routine follow-up of cervical cancer  Heme/Onc history: patient is a 35 year old female G71 who initially presented to the ER on 03/15/2018 with symptoms of abdominal pain and cramping. She has also been having irregular menstrual bleeding and spotting on and off for the last few months.She had an ultrasound pelvis done in the ER which showed a large 5.8 cm hypoechoic vascular mass within the cervix and the lower uterine segment. Patient had last seen GYN in 2013 and had not had a Pap smear done since then. Patient was seen by GYN Dr. Nechama Guard on 1213 and was found to have firm friable cervix and multiple biopsies were taken which showed adenocarcinoma. She was then seen by Dr. Fransisca Connors from GYN oncology. Pelvic exam showed anteverted cervix that was replaced by tumor and extension of cancer into the left parametrium and possibly to the pelvic sidewall.  PET CT scan on 03/28/2018 showed 6 cm hypermetabolic cervical mass consistent with primary cervical carcinoma. Mild hypermetabolic bilateral parametrial right perirectal bilateral iliac lymph nodes consistent with metastatic disease. No evidence of metastatic disease within the abdomen chest or neck.  Cycle 1 of  weekly cisplatin and radiation started on 04/11/2018. She completed chemoradiation on 2/13/2020followed by vaginal brachii therapy. PET CT scan thereafter did not show any evidence of metastatic disease and interval resolution of hypermetabolic pelvic adenopathy and no hypermetabolism seen in the area of the cervix.  Patient also follows up with urology for issues of pelvic pain urinary frequency. 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  Nausea medications do not really have during those episodes and it comes without any warning symptoms.  She has some ongoing issues with Dysuria and burning secondary to radiation cystitis but she ultimately decided not to proceed with hyperbaric oxygen therapy at [facility] for the same.  She wants to slowly get off Cymbalta.  She is also on gabapentin.  ECOG PS- 0 Pain scale- 0 Opioid associated constipation- no  Review of systems- Review of Systems  Constitutional: Negative for chills, fever, malaise/fatigue and weight loss.  HENT: Negative for congestion, ear discharge and nosebleeds.   Eyes: Negative for blurred vision.  Respiratory: Negative for cough, hemoptysis, sputum production, shortness of breath and wheezing.   Cardiovascular: Negative for chest pain, palpitations, orthopnea and claudication.  Gastrointestinal: Positive for nausea. Negative for abdominal pain, blood in stool, constipation, diarrhea, heartburn, melena and vomiting.  Genitourinary: Negative for dysuria, flank pain, frequency, hematuria and urgency.       Pelvic pain  Musculoskeletal: Negative for back pain, joint pain and myalgias.  Skin: Negative for rash.  Neurological: Negative for dizziness, tingling, focal weakness, seizures, weakness and headaches.  Endo/Heme/Allergies: Does not bruise/bleed easily.  Psychiatric/Behavioral: Negative for  depression and suicidal ideas. The patient does not have insomnia.       Allergies  Allergen Reactions  . Amoxicillin Rash    Did it involve swelling of the face/tongue/throat, SOB, or low BP? No Did it involve sudden or severe rash/hives, skin peeling, or any reaction on the inside of your mouth or nose? No Did you need to seek medical attention at a hospital or doctor's office? No When did it last happen?Childhood If all above answers are "NO", may proceed with cephalosporin use.   Marland Kitchen Penicillins Hives    Did it involve swelling of the face/tongue/throat, SOB, or low BP? No Did it involve sudden or severe rash/hives, skin peeling, or any reaction on the inside of your mouth or nose? No Did you need to seek medical attention at a hospital or doctor's office? No When did it last happen?Childhood If all above answers are "NO", may proceed with cephalosporin use.      Past Medical History:  Diagnosis Date  . Anxiety   . Cervical cancer ([place]) 03/2018  . Hashimoto's thyroiditis   . 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 History of cervical cancer   . Hypothyroid   . Low back pain      Past Surgical History:  Procedure Laterality Date  . BUNIONECTOMY Right   . PORTA CATH INSERTION N/A 03/31/2018   Procedure: PORTA CATH INSERTION;  Surgeon: Algernon Huxley, MD;  Location: [facility];  Service: Cardiovascular;  Laterality: N/A;  . TONSILLECTOMY    . TRANSURETHRAL RESECTION OF BLADDER TUMOR N/A 06/20/2019   Procedure: TRANSURETHRAL RESECTION OF BLADDER TUMOR (TURBT);  Surgeon: [name], MD;  Location: [facility];  Service: Urology;  Laterality: N/A;    Social History   Socioeconomic History  . Marital status: Married    Spouse name: Not on file  . Number of children: Not on file  . Years of education: Not on file  . Highest education level: Not on file  Occupational History  . Not on file  Tobacco Use  . Smoking status: Current Some Day  Smoker    Packs/day: 0.15    Years: 10.00    Pack years: 1.50    Types: Cigarettes  . Smokeless tobacco: Never Used  . Tobacco comment: 2 weeks to use a pack  Vaping Use  . Vaping Use: Some days  Substance and Sexual Activity  . Alcohol use: Yes    Comment: rare  . Drug use: Yes    Types: Marijuana    Comment: quit opiates 5 years ago  . Sexual activity: Yes    Birth control/protection: None  Other Topics Concern  . Not on file  Social History Narrative  . Not on file   Social Determinants of Health   Financial Resource Strain: Not on file  Food Insecurity: Not on file  Transportation Needs: Not on file  Physical Activity: Not on file  Stress: Not on file  Social Connections: Not on file  Intimate Partner Violence: Not on file    Family History  Adopted: Yes  Family history unknown: Yes     Current Outpatient Medications:  .  ALPRAZolam (XANAX) 1 MG tablet, TAKE ONE TABLET BY MOUTH 3 TIMES DAILY AS NEEDED, Disp: 90 tablet, Rfl: 1 .  amphetamine-dextroamphetamine (ADDERALL) 10 MG tablet, Take 1 tablet (10 mg total) by mouth 2 (two) times daily with a meal., Disp: 60 tablet, Rfl: 0 .  amphetamine-dextroamphetamine (  ADDERALL) 10 MG tablet, Take 1 tablet (10 mg total) by mouth 2 (two) times daily with a meal., Disp: 60 tablet, Rfl: 0 .  amphetamine-dextroamphetamine (ADDERALL) 10 MG tablet, Take 1 tablet (10 mg total) by mouth 2 (two) times daily with a meal., Disp: 60 tablet, Rfl: 0 .  DULoxetine (CYMBALTA) 60 MG capsule, Take 1 capsule (60 mg total) by mouth daily., Disp: 30 capsule, Rfl: 1 .  estradiol (VIVELLE-DOT) 0.1 MG/24HR patch, APPLY 1 PATCH TOPICALLY TWICE A WEEK, Disp: 24 patch, Rfl: 3 .  gabapentin (NEURONTIN) 300 MG capsule, TAKE 1 CAPSULE BY MOUTH THREE TIMES DAILY, Disp: 90 capsule, Rfl: 0 .  lansoprazole (PREVACID) 30 MG capsule, Take 1 capsule by mouth once daily, Disp: 90 capsule, Rfl: 3 .  levothyroxine (SYNTHROID) 175 MCG tablet, TAKE 1 TABLET BY MOUTH  ONCE DAILY BEFORE BREAKFAST, Disp: 90 tablet, Rfl: 0 .  lidocaine-prilocaine (EMLA) cream, Apply to affected area once, Disp: 30 g, Rfl: 3 .  METHADONE HCL PO, Take 15 mg by mouth daily at 6 (six) AM.  (Patient not taking: No sig reported), Disp: , Rfl:  .  nitrofurantoin, macrocrystal-monohydrate, (MACROBID) 100 MG capsule, Take 1 capsule (100 mg total) by mouth 2 (two) times daily., Disp: 10 capsule, Rfl: 1 .  ondansetron (ZOFRAN) 8 MG tablet, Take 8 mg by mouth every 8 (eight) hours as needed for nausea or vomiting. (Patient not taking: No sig reported), Disp: , Rfl:  .  oxybutynin (DITROPAN XL) 15 MG 24 hr tablet, Take 1 tablet (15 mg total) by mouth daily., Disp: 30 tablet, Rfl: 3 .  progesterone (PROMETRIUM) 200 MG capsule, TAKE ONE CAPSULE BY MOUTH AT BEDTIME WITH FOOD FOR 12 DAYS SEQUENTIALLY PER 28 DAY CYCLE, Disp: 36 capsule, Rfl: 3 .  promethazine (PHENERGAN) 25 MG tablet, Take 0.5-1 tablets (12.5-25 mg total) by mouth every 12 (twelve) hours as needed for nausea or vomiting., Disp: 30 tablet, Rfl: 2 .  solifenacin (VESICARE) 5 MG tablet, Take 1 tablet (5 mg total) by mouth daily., Disp: 30 tablet, Rfl: 5 No current facility-administered medications for this visit.  Facility-Administered Medications Ordered in Other Visits:  .  sodium chloride flush (NS) 0.9 % injection 10 mL, 10 mL, Intravenous, PRN, Sindy Guadeloupe, MD, 10 mL at 04/15/18 0825  Physical exam:  Vitals:   08/19/20 1547  BP: 138/83  Pulse: 78  Resp: 16  Temp: 98 F (36.7 C)  TempSrc: Oral  Weight: 272 lb 8 oz (123.6 kg)  Height: 5\' 7"  (1.702 m)   Physical Exam HENT:     Head: Normocephalic and atraumatic.  Eyes:     Pupils: Pupils are equal, round, and reactive to light.  Cardiovascular:     Rate and Rhythm: Normal rate and regular rhythm.     Heart sounds: Normal heart sounds.  Pulmonary:     Effort: Pulmonary effort is normal.     Breath sounds: Normal breath sounds.  Abdominal:     General: Bowel  sounds are normal.     Palpations: Abdomen is soft.  Musculoskeletal:     Cervical back: Normal range of motion.  Skin:    General: Skin is warm and dry.  Neurological:     Mental Status: She is alert and oriented to person, place, and time.      CMP Latest Ref Rng & Units 12/01/2019  Glucose 70 - 99 mg/dL 96  BUN 6 - 20 mg/dL 9  Creatinine 0.44 - 1.00 mg/dL 0.80  Sodium 135 - 145 mmol/L 136  Potassium 3.5 - 5.1 mmol/L 4.1  Chloride 98 - 111 mmol/L 102  CO2 22 - 32 mmol/L 26  Calcium 8.9 - 10.3 mg/dL 8.7(L)  Total Protein 6.5 - 8.1 g/dL 7.6  Total Bilirubin 0.3 - 1.2 mg/dL 0.5  Alkaline Phos 38 - 126 U/L 67  AST 15 - 41 U/L 23  ALT 0 - 44 U/L 20   CBC Latest Ref Rng & Units 12/01/2019  WBC 4.0 - 10.5 K/uL 8.8  Hemoglobin 12.0 - 15.0 g/dL 13.6  Hematocrit 36.0 - 46.0 % 37.6  Platelets 150 - 400 K/uL 379     Assessment and plan- Patient is a 35 y.o. female with adenocarcinoma of the cervix stage IIIc T2 N1 M0 s/p concurrent chemoradiation currently in remission.    This is a routine follow-up visit  Cervical cancer surveillance: Clinically patient is doing well with no concerning signs and symptoms of recurrence.  Pelvic exam was not done today but patient states that she was seen recently by Dr. Christel Mormon at [facility] and had Pelvic exam which was unremarkable.  She can follow-up with GYN oncology in 3 to 4 months.  I will see her back in about 6 to 7 months  Radiation cystitis: She did not wish to proceed with hyperbaric oxygen therapy.  Continue gabapentin and she would like to slowly come off Cymbalta if possible.  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  Patient does report some dyspareunia and is sexually active only rarely.  Patient does not have any overt symptoms of reflux and have asked her to come off omeprazole as long-term PPI can lead to osteoporosis and increased risk of C. difficile   Visit  Diagnosis 1. Encounter for follow-up surveillance of cervical cancer   2. Chemotherapy-induced peripheral neuropathy ([place])      Dr. Randa Evens, MD, MPH [facility] [phone] 08/19/2020 3:21 PM

## 2020-08-21 ENCOUNTER — Ambulatory Visit (INDEPENDENT_AMBULATORY_CARE_PROVIDER_SITE_OTHER): Payer: Medicaid Other | Admitting: Family Medicine

## 2020-08-21 ENCOUNTER — Other Ambulatory Visit: Payer: Self-pay

## 2020-08-21 ENCOUNTER — Encounter: Payer: Self-pay | Admitting: Family Medicine

## 2020-08-21 VITALS — BP 107/62 | HR 80 | Resp 16 | Wt 270.0 lb

## 2020-08-21 DIAGNOSIS — E038 Other specified hypothyroidism: Secondary | ICD-10-CM | POA: Diagnosis not present

## 2020-08-21 DIAGNOSIS — E063 Autoimmune thyroiditis: Secondary | ICD-10-CM

## 2020-08-21 DIAGNOSIS — F988 Other specified behavioral and emotional disorders with onset usually occurring in childhood and adolescence: Secondary | ICD-10-CM | POA: Diagnosis not present

## 2020-08-21 DIAGNOSIS — F419 Anxiety disorder, unspecified: Secondary | ICD-10-CM

## 2020-08-21 DIAGNOSIS — R102 Pelvic and perineal pain: Secondary | ICD-10-CM

## 2020-08-21 DIAGNOSIS — C539 Malignant neoplasm of cervix uteri, unspecified: Secondary | ICD-10-CM

## 2020-08-21 MED ORDER — DULOXETINE HCL 60 MG PO CPEP: 60.0000 mg | ORAL_CAPSULE | Freq: Every day | ORAL | 3 refills | Status: DC

## 2020-08-21 NOTE — Progress Notes (Signed)
Established patient visit   Patient: Kristy Gordon   DOB: 02/02/1986   35 y.o. Female  MRN: [id number] Visit Date: 08/21/2020  Today's healthcare provider: Wilhemena Durie, MD   Chief Complaint  Patient presents with  . Follow-up   Subjective    HPI  Patient comes in today for follow-up. 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  Is having chronic anxiety.  Some mild depression. Follow up for Attention deficit disorder, unspecified hyperactivity presence  The patient was last seen for this 2 months ago. Changes made at last visit include; Increased Adderall to twice daily as needed.  Follow-up in a couple of months.  She reports good compliance with treatment. She feels that condition is Improved. She is not having side effects.        Medications: Outpatient Medications Prior to Visit  Medication Sig  . ALPRAZolam (XANAX) 1 MG tablet TAKE ONE TABLET BY MOUTH 3 TIMES DAILY AS NEEDED  . amphetamine-dextroamphetamine (ADDERALL) 10 MG tablet Take 1 tablet (10 mg total) by mouth 2 (two) times daily with a meal.  . amphetamine-dextroamphetamine (ADDERALL) 10 MG tablet Take 1 tablet (10 mg total) by mouth 2 (two) times daily with a meal.  . amphetamine-dextroamphetamine (ADDERALL) 10 MG tablet Take 1 tablet (10 mg total) by mouth 2 (two) times daily with a meal.  . DULoxetine (CYMBALTA) 60 MG capsule Take 1 capsule (60 mg total) by mouth daily.  Marland Kitchen estradiol (VIVELLE-DOT) 0.1 MG/24HR patch APPLY 1 PATCH TOPICALLY TWICE A WEEK  . gabapentin (NEURONTIN) 300 MG capsule Take 1 capsule (300 mg total) by mouth 3 (three) times daily.  . lansoprazole (PREVACID) 30 MG capsule Take 1 capsule by mouth once daily  . levothyroxine (SYNTHROID) 175 MCG tablet TAKE 1 TABLET BY MOUTH ONCE DAILY BEFORE BREAKFAST  . lidocaine-prilocaine (EMLA) cream Apply to affected area once  . ondansetron (ZOFRAN) 8 MG tablet Take 8 mg by mouth every 8 (eight) hours as needed for nausea or  vomiting.  Marland Kitchen oxybutynin (DITROPAN XL) 15 MG 24 hr tablet Take 1 tablet (15 mg total) by mouth daily.  . progesterone (PROMETRIUM) 200 MG capsule TAKE ONE CAPSULE BY MOUTH AT BEDTIME WITH FOOD FOR 12 DAYS SEQUENTIALLY PER 28 DAY CYCLE  . promethazine (PHENERGAN) 25 MG tablet Take 0.5-1 tablets (12.5-25 mg total) by mouth every 12 (twelve) hours as needed for nausea or vomiting.   Facility-Administered Medications Prior to Visit  Medication Dose Route Frequency Provider  . sodium chloride flush (NS) 0.9 % injection 10 mL  10 mL Intravenous PRN Sindy Guadeloupe, MD    Review of Systems  Constitutional: Negative for appetite change, chills, fatigue and fever.  Respiratory: Negative for chest tightness and shortness of breath.   Cardiovascular: Negative for chest pain and palpitations.  Gastrointestinal: Negative for abdominal pain, nausea and vomiting.  Neurological: Negative for dizziness and weakness.        Objective    BP 107/62   Pulse 80   Resp 16   Wt 270 lb (122.5 kg)   LMP  (LMP Unknown)   BMI 42.29 kg/m  BP Readings from Last 3 Encounters:  08/21/20 107/62  08/19/20 138/83  06/19/20 117/79   Wt Readings from Last 3 Encounters:  08/21/20 270 lb (122.5 kg)  08/19/20 272 lb 8 oz (123.6 kg)  06/19/20 269 lb (122 kg)       Physical Exam Vitals reviewed.  Constitutional:  Appearance: She is obese.  HENT:     Right Ear: External ear normal.     Left Ear: External ear normal.  Eyes:     General: No scleral icterus. Neck:     Comments: Goiter present.,stable. Cardiovascular:     Rate and Rhythm: Normal rate and regular rhythm.     Heart sounds: Normal heart sounds.  Pulmonary:     Effort: Pulmonary effort is normal.     Breath sounds: Normal breath sounds.  Abdominal:     Palpations: Abdomen is soft.  Musculoskeletal:     Right lower leg: No edema.     Left lower leg: No edema.  Skin:    General: Skin is warm and dry.  Neurological:     General: No  focal deficit present.     Mental Status: She is alert and oriented to person, place, and time.  Psychiatric:        Mood and Affect: Mood normal.        Behavior: Behavior normal.        Thought Content: Thought content normal.        Judgment: Judgment normal.       No results found for any visits on 08/21/20.  Assessment & Plan     1. Attention deficit disorder, unspecified hyperactivity presence Stable on Adderall   2. Anxiety Start duloxetine which also might help her chronic pain due to radiation therapy - DULoxetine (CYMBALTA) 60 MG capsule; Take 1 capsule (60 mg total) by mouth daily.  Dispense: 90 capsule; Refill: 3  3. Hypothyroidism due to Hashimoto's thyroiditis   4. 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[place])   5. Vaginal pain Due to radiation therapy   No follow-ups on file.      I, Wilhemena Durie, MD, have reviewed all documentation for this visit. The documentation on 08/30/20 for the exam, diagnosis, procedures, and orders are all accurate and complete.    Dontavion Noxon Cranford Mon, MD  [facility] [phone] (phone) [phone] (fax)  [place]

## 2020-08-30 ENCOUNTER — Other Ambulatory Visit: Payer: Self-pay | Admitting: Family Medicine

## 2020-08-30 DIAGNOSIS — U071 COVID-19: Secondary | ICD-10-CM

## 2020-08-30 NOTE — Progress Notes (Signed)
covid

## 2020-08-31 ENCOUNTER — Encounter: Payer: Self-pay | Admitting: Oncology

## 2020-08-31 ENCOUNTER — Telehealth: Payer: Self-pay | Admitting: Oncology

## 2020-08-31 NOTE — Telephone Encounter (Signed)
Called to discuss with patient about COVID-19 symptoms and the use of one of the available treatments for those with mild to moderate Covid symptoms and at a high risk of hospitalization.  Pt appears to qualify for outpatient treatment due to co-morbid conditions and/or a member of an at-risk group in accordance with the FDA Emergency Use Authorization.    Symptom onset: Unsure  Vaccinated: Yes Booster? No  Immunocompromised? Yes Qualifiers:  Past Medical History:  Diagnosis Date  . Anxiety   . Cervical cancer ([place]) 03/2018  . Hashimoto's thyroiditis   . 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 History of cervical cancer   . Hypothyroid   . Low back pain     NIH Criteria: Tier 1  Unable to reach pt - Left VM and sent [place]

## 2020-10-09 ENCOUNTER — Other Ambulatory Visit: Payer: Self-pay | Admitting: Family Medicine

## 2020-10-09 DIAGNOSIS — E038 Other specified hypothyroidism: Secondary | ICD-10-CM

## 2020-10-23 ENCOUNTER — Other Ambulatory Visit: Payer: Self-pay | Admitting: Family Medicine

## 2020-12-04 ENCOUNTER — Inpatient Hospital Stay: Payer: Medicaid Other | Attending: Obstetrics and Gynecology

## 2020-12-04 DIAGNOSIS — C539 Malignant neoplasm of cervix uteri, unspecified: Secondary | ICD-10-CM | POA: Diagnosis not present

## 2020-12-11 ENCOUNTER — Inpatient Hospital Stay: Payer: Medicaid Other | Attending: Obstetrics and Gynecology

## 2020-12-11 DIAGNOSIS — C539 Malignant neoplasm of cervix uteri, unspecified: Secondary | ICD-10-CM | POA: Insufficient documentation

## 2020-12-11 DIAGNOSIS — Z452 Encounter for adjustment and management of vascular access device: Secondary | ICD-10-CM | POA: Diagnosis present

## 2020-12-11 DIAGNOSIS — Z95828 Presence of other vascular implants and grafts: Secondary | ICD-10-CM

## 2020-12-11 MED ORDER — SODIUM CHLORIDE 0.9% FLUSH
10.0000 mL | INTRAVENOUS | Status: DC | PRN
  Administered 2020-12-11: 10 mL via INTRAVENOUS
  Filled 2020-12-11: qty 10

## 2020-12-11 MED ORDER — HEPARIN SOD (PORK) LOCK FLUSH 100 UNIT/ML IV SOLN
500.0000 [IU] | Freq: Once | INTRAVENOUS | Status: AC
Start: 2020-12-11 — End: 2020-12-11
  Administered 2020-12-11: 500 [IU] via INTRAVENOUS
  Filled 2020-12-11: qty 5

## 2020-12-11 MED ORDER — HEPARIN SOD (PORK) LOCK FLUSH 100 UNIT/ML IV SOLN
INTRAVENOUS | Status: AC
  Filled 2020-12-11: qty 5

## 2021-01-02 ENCOUNTER — Telehealth: Payer: Self-pay

## 2021-01-02 NOTE — Telephone Encounter (Signed)
Letter sent for missed 12/04/20 gyn oncology appointment.

## 2021-01-14 ENCOUNTER — Other Ambulatory Visit: Payer: Self-pay | Admitting: Oncology

## 2021-02-03 ENCOUNTER — Other Ambulatory Visit: Payer: Self-pay | Admitting: Oncology

## 2021-02-03 ENCOUNTER — Other Ambulatory Visit: Payer: Self-pay | Admitting: Family Medicine

## 2021-02-03 DIAGNOSIS — E038 Other specified hypothyroidism: Secondary | ICD-10-CM

## 2021-02-03 NOTE — Telephone Encounter (Signed)
Requested medications are due for refill today.  yes  Requested medications are on the active medications list.  yes  Last refill. 10/10/2020  Future visit scheduled.   no  Notes to clinic.  2 requests for the same medication. Failed protocol d/t abnormal expired labs.

## 2021-02-24 ENCOUNTER — Other Ambulatory Visit: Payer: Self-pay | Admitting: Family Medicine

## 2021-02-28 ENCOUNTER — Ambulatory Visit: Payer: Medicaid Other | Admitting: Oncology

## 2021-02-28 ENCOUNTER — Other Ambulatory Visit: Payer: Medicaid Other

## 2021-03-04 ENCOUNTER — Other Ambulatory Visit: Payer: Self-pay | Admitting: *Deleted

## 2021-03-04 DIAGNOSIS — C539 Malignant neoplasm of cervix uteri, unspecified: Secondary | ICD-10-CM

## 2021-03-07 ENCOUNTER — Inpatient Hospital Stay: Payer: Medicaid Other | Attending: Oncology

## 2021-03-07 ENCOUNTER — Encounter: Payer: Self-pay | Admitting: Oncology

## 2021-03-07 ENCOUNTER — Inpatient Hospital Stay: Payer: Medicaid Other | Admitting: Oncology

## 2021-03-07 ENCOUNTER — Telehealth: Payer: Self-pay | Admitting: Oncology

## 2021-03-07 NOTE — Telephone Encounter (Signed)
Left VM with patient to follow-up after missed appointment today.

## 2021-03-11 IMAGING — CR DG HAND COMPLETE 3+V*R*
1 series · 3 of 3 positions shown · non-contrast
Comparison: None.

CLINICAL DATA: Thumb pain

EXAM:
RIGHT HAND - COMPLETE 3+ VIEW

[Series 1: dg hand complete right · 0.14mm/px · 3 of 3 slices shown]
[im 1/3]
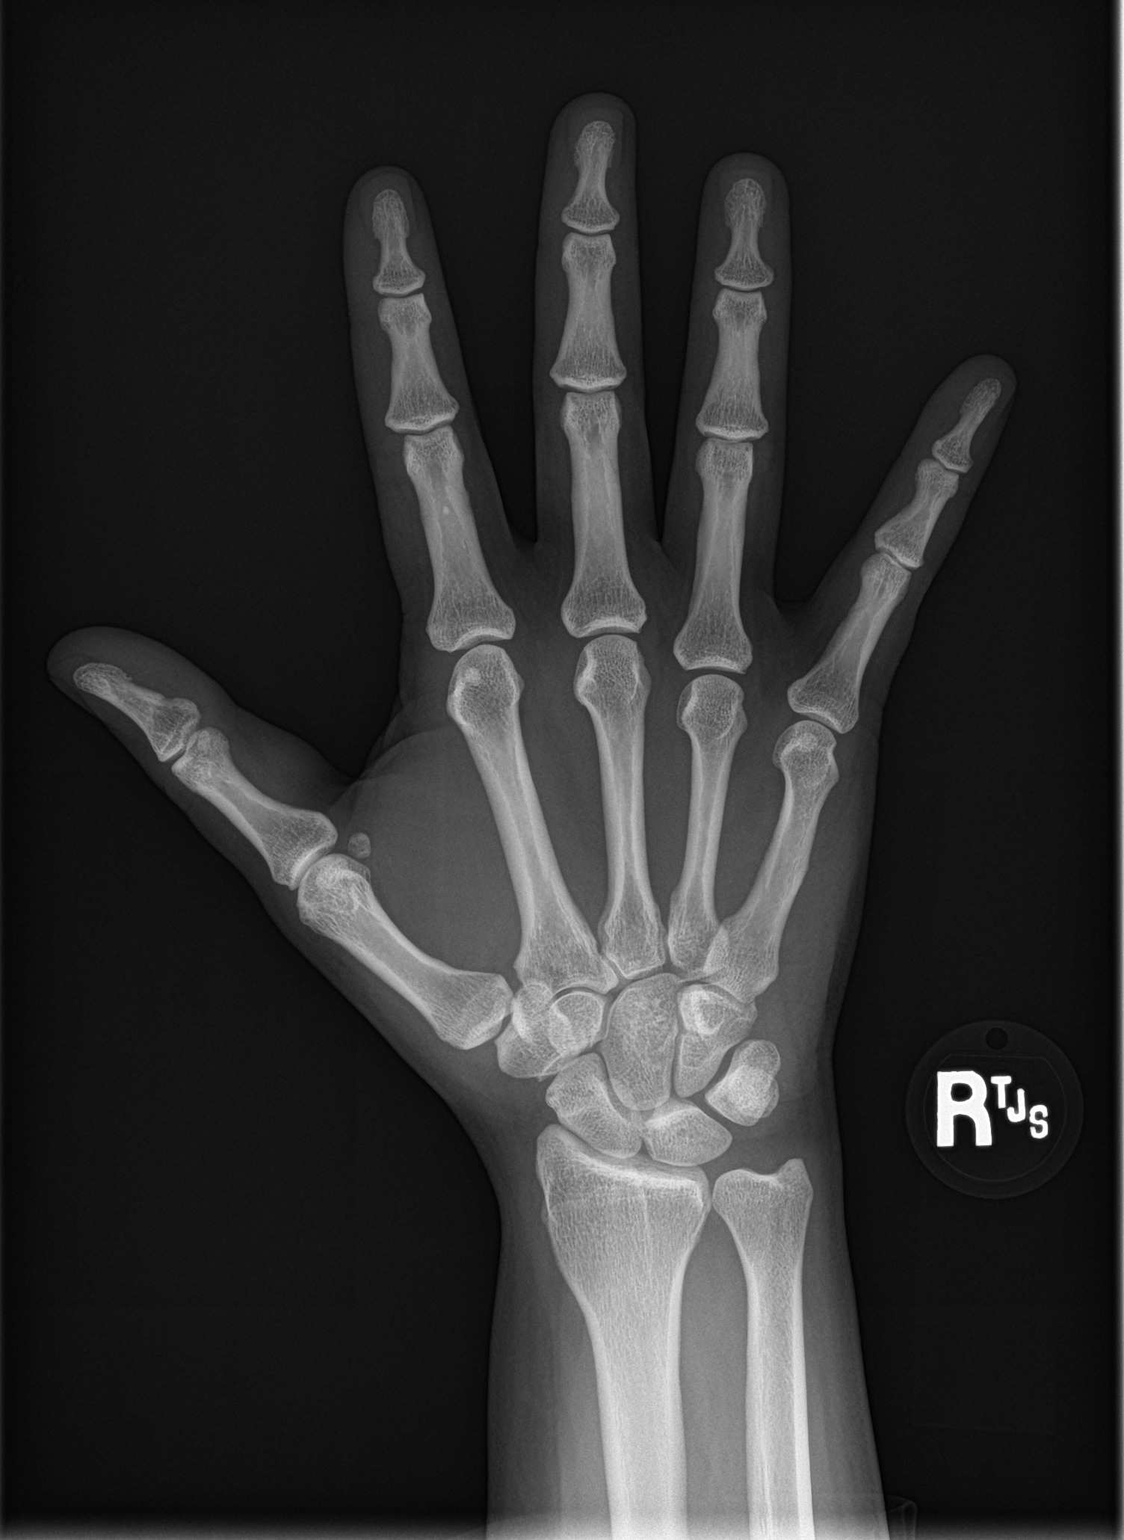
[im 2/3]
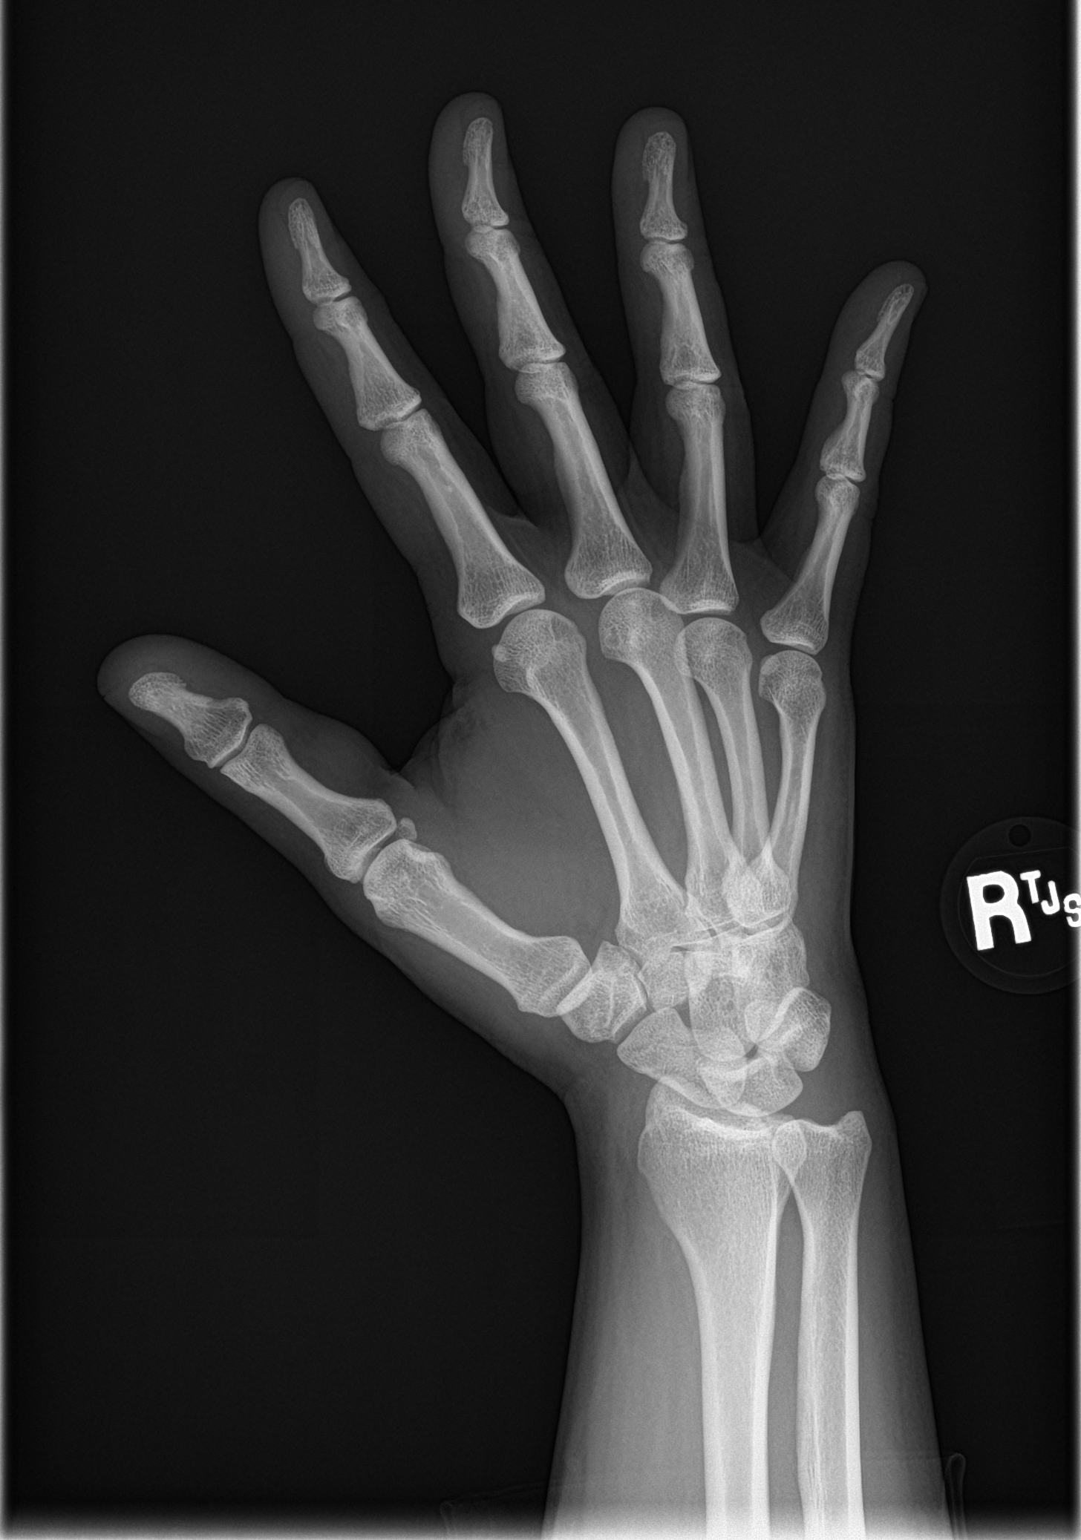
[im 3/3]
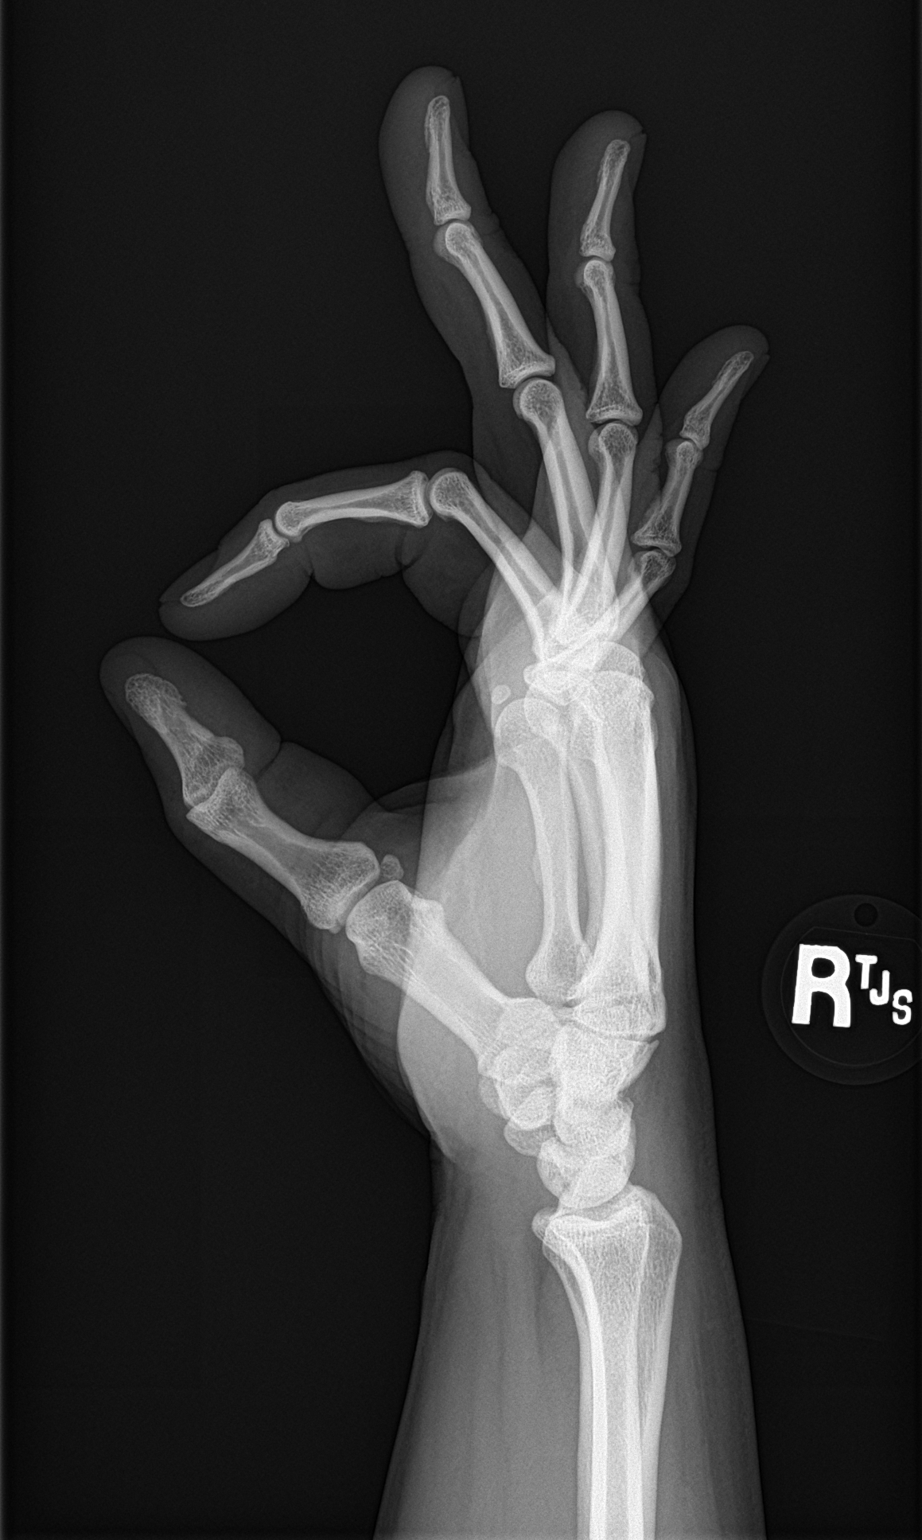

[3 of 3 positions shown; findings below may reference images not displayed]

FINDINGS: There is no evidence of fracture or dislocation. There is no
evidence of arthropathy or other focal bone abnormality. Soft
tissues are unremarkable.
IMPRESSION: Negative.

## 2021-03-15 ENCOUNTER — Other Ambulatory Visit: Payer: Self-pay | Admitting: Oncology

## 2021-03-18 ENCOUNTER — Encounter: Payer: Self-pay | Admitting: Oncology

## 2021-04-01 ENCOUNTER — Telehealth: Payer: Self-pay | Admitting: Oncology

## 2021-04-01 NOTE — Telephone Encounter (Signed)
Pt called to reschedule appt for 12-20. Call back at [phone]

## 2021-04-10 ENCOUNTER — Inpatient Hospital Stay: Payer: Medicaid Other | Attending: Oncology

## 2021-04-10 ENCOUNTER — Encounter: Payer: Self-pay | Admitting: Nurse Practitioner

## 2021-04-10 ENCOUNTER — Inpatient Hospital Stay (HOSPITAL_BASED_OUTPATIENT_CLINIC_OR_DEPARTMENT_OTHER): Payer: Medicaid Other | Admitting: Nurse Practitioner

## 2021-04-10 ENCOUNTER — Other Ambulatory Visit: Payer: Self-pay

## 2021-04-10 VITALS — BP 147/85 | HR 79 | Temp 96.8°F | Resp 20 | Wt 284.7 lb

## 2021-04-10 DIAGNOSIS — Z08 Encounter for follow-up examination after completed treatment for malignant neoplasm: Secondary | ICD-10-CM | POA: Diagnosis not present

## 2021-04-10 DIAGNOSIS — Z8541 Personal history of malignant neoplasm of cervix uteri: Secondary | ICD-10-CM | POA: Diagnosis not present

## 2021-04-10 DIAGNOSIS — Z79890 Hormone replacement therapy (postmenopausal): Secondary | ICD-10-CM

## 2021-04-10 DIAGNOSIS — E28319 Asymptomatic premature menopause: Secondary | ICD-10-CM | POA: Diagnosis not present

## 2021-04-10 DIAGNOSIS — N3040 Irradiation cystitis without hematuria: Secondary | ICD-10-CM

## 2021-04-10 DIAGNOSIS — Z79899 Other long term (current) drug therapy: Secondary | ICD-10-CM | POA: Insufficient documentation

## 2021-04-10 DIAGNOSIS — C539 Malignant neoplasm of cervix uteri, unspecified: Secondary | ICD-10-CM | POA: Diagnosis present

## 2021-04-10 LAB — CBC WITH DIFFERENTIAL/PLATELET
Abs Immature Granulocytes: 0.02 10*3/uL (ref 0.00–0.07)
Basophils Absolute: 0.1 10*3/uL (ref 0.0–0.1)
Basophils Relative: 1 %
Eosinophils Absolute: 0.2 10*3/uL (ref 0.0–0.5)
Eosinophils Relative: 2 %
HCT: 37.7 % (ref 36.0–46.0)
Hemoglobin: 13.3 g/dL (ref 12.0–15.0)
Immature Granulocytes: 0 %
Lymphocytes Relative: 39 %
Lymphs Abs: 3.3 10*3/uL (ref 0.7–4.0)
MCH: 31.7 pg (ref 26.0–34.0)
MCHC: 35.3 g/dL (ref 30.0–36.0)
MCV: 90 fL (ref 80.0–100.0)
Monocytes Absolute: 0.7 10*3/uL (ref 0.1–1.0)
Monocytes Relative: 8 %
Neutro Abs: 4.2 10*3/uL (ref 1.7–7.7)
Neutrophils Relative %: 50 %
Platelets: 362 10*3/uL (ref 150–400)
RBC: 4.19 MIL/uL (ref 3.87–5.11)
RDW: 12.9 % (ref 11.5–15.5)
WBC: 8.4 10*3/uL (ref 4.0–10.5)
nRBC: 0 % (ref 0.0–0.2)

## 2021-04-10 LAB — COMPREHENSIVE METABOLIC PANEL
ALT: 29 U/L (ref 0–44)
AST: 25 U/L (ref 15–41)
Albumin: 3.9 g/dL (ref 3.5–5.0)
Alkaline Phosphatase: 67 U/L (ref 38–126)
Anion gap: 10 (ref 5–15)
BUN: 11 mg/dL (ref 6–20)
CO2: 23 mmol/L (ref 22–32)
Calcium: 8.7 mg/dL — ABNORMAL LOW (ref 8.9–10.3)
Chloride: 102 mmol/L (ref 98–111)
Creatinine, Ser: 0.65 mg/dL (ref 0.44–1.00)
GFR, Estimated: >60 mL/min (ref >60)
Glucose, Bld: 88 mg/dL (ref 70–99)
Potassium: 3.4 mmol/L — ABNORMAL LOW (ref 3.5–5.1)
Sodium: 135 mmol/L (ref 135–145)
Total Bilirubin: 0.4 mg/dL (ref 0.3–1.2)
Total Protein: 7.9 g/dL (ref 6.5–8.1)

## 2021-04-10 MED ORDER — HEPARIN SOD (PORK) LOCK FLUSH 100 UNIT/ML IV SOLN
500.0000 [IU] | Freq: Once | INTRAVENOUS | Status: AC
  Administered 2021-04-10: 500 [IU] via INTRAVENOUS
  Filled 2021-04-10: qty 5

## 2021-04-10 MED ORDER — ESTRADIOL 1 MG PO TABS: 1.0000 mg | ORAL_TABLET | Freq: Every day | ORAL | 5 refills | Status: DC

## 2021-04-10 MED ORDER — OXYBUTYNIN CHLORIDE ER 15 MG PO TB24: 15.0000 mg | ORAL_TABLET | Freq: Every day | ORAL | 0 refills | Status: DC

## 2021-04-10 MED ORDER — OXYBUTYNIN CHLORIDE ER 15 MG PO TB24: 15.0000 mg | ORAL_TABLET | Freq: Every day | ORAL | 3 refills | Status: DC

## 2021-04-10 MED ORDER — SODIUM CHLORIDE 0.9% FLUSH
10.0000 mL | Freq: Once | INTRAVENOUS | Status: AC
  Administered 2021-04-10: 10 mL via INTRAVENOUS
  Filled 2021-04-10: qty 10

## 2021-04-10 NOTE — Progress Notes (Signed)
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 Positive pelvic LN s/p concurrent chemoradiation currently in remission   Chief complaint/ Reason for visit- routine follow-up of cervical cancer  Heme/Onc history: patient is a 36 year old female G81 who initially presented to the ER on 03/15/2018 with symptoms of abdominal pain and cramping.  She has also been having irregular menstrual bleeding and spotting on and off for the last few months.She had an ultrasound pelvis done in the ER which showed a large 5.8 cm hypoechoic vascular mass within the cervix and the lower uterine segment.  Patient had last seen GYN in 2013 and had not had a Pap smear done since then.  Patient was seen by GYN Dr. Nechama Guard on 1213 and was found to have firm friable cervix and multiple biopsies were taken which showed adenocarcinoma.  She was then seen by Dr. Fransisca Connors from GYN oncology.  Pelvic exam showed anteverted cervix that was replaced by tumor and extension of cancer into the left parametrium and possibly to the pelvic sidewall.    PET CT scan on 03/28/2018 showed 6 cm hypermetabolic cervical mass consistent with primary cervical carcinoma.  Mild hypermetabolic bilateral parametrial right perirectal bilateral iliac lymph nodes consistent with metastatic disease.  No evidence of metastatic disease within the abdomen chest or neck.   Cycle 1 of  weekly cisplatin and radiation started on 04/11/2018.  She completed chemoradiation on 05/19/2018 followed by vaginal brachii therapy.  PET CT scan thereafter did not show any evidence of metastatic disease and interval resolution of hypermetabolic pelvic adenopathy and no hypermetabolism seen in the area of the cervix.   Patient also follows up with urology for issues of pelvic pain urinary frequency.  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 IIIC1 adenocarcinoma of the cervix - 05/19/18 Chemo (Dr. Janese Banks) - RT (Dr. Baruch Gouty) 45 Gy to the pelvis with boost to 50 Gy to the enlarged pelvic nodes. - 2/17 / 06/07/18 Brachytherapy (Cervix, T&O/R) (Dr. Christel Mormon) Total Dose 2750 cGy / 5 fractions (each fraction 550 cGy)  She continues to dysuria and burning secondary to radiation cystitis. Hyperbaric oxygen therapy was recommended but she expressed concern about frequent commuting to [place]. She continues oxybutynin and requests refill. She continues hrt but has noticed skin reaction to patches which is red, itchy, and uncomfortable. 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 She's interested in volunteering with support groups as a cancer survivor. She denies vaginal pain or bleeding.   ECOG PS- 0 Pain scale- 0 Opioid associated constipation- no  Review of systems- Review of Systems  Constitutional:  Negative for chills, fever, malaise/fatigue and weight loss.  HENT:  Negative for hearing loss, nosebleeds, sore throat and tinnitus.   Eyes:  Negative for blurred vision and double vision.  Respiratory:  Negative for cough, hemoptysis, shortness of breath and wheezing.  Cardiovascular:  Negative for chest pain, palpitations and leg swelling.  Gastrointestinal:  Negative for abdominal pain, blood in stool, constipation, diarrhea, melena, nausea and vomiting.  Genitourinary:   Positive for dysuria, frequency and urgency. Negative for flank pain and hematuria.  Musculoskeletal:  Negative for back pain, falls, joint pain and myalgias.  Skin:  Negative for itching and rash.  Neurological:  Negative for dizziness, tingling, sensory change, loss of consciousness, weakness and headaches.  Endo/Heme/Allergies:  Negative for environmental allergies. Does not bruise/bleed easily.  Psychiatric/Behavioral:  Negative for depression. The patient is not nervous/anxious and does not have insomnia.      Allergies  Allergen Reactions   Amoxicillin Rash    Did it involve swelling of the face/tongue/throat, SOB, or low BP? No Did it involve sudden or severe rash/hives, skin peeling, or any reaction on the inside of your mouth or nose? No Did you need to seek medical attention at a hospital or doctor's office? No When did it last happen?  Childhood     If all above answers are "NO", may proceed with cephalosporin use.    Penicillins Hives    Did it involve swelling of the face/tongue/throat, SOB, or low BP? No Did it involve sudden or severe rash/hives, skin peeling, or any reaction on the inside of your mouth or nose? No Did you need to seek medical attention at a hospital or doctor's office? No When did it last happen?  Childhood     If all above answers are NO, may proceed with cephalosporin use.      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   History of cervical cancer    Hypothyroid    Low back pain      Past Surgical History:  Procedure Laterality Date   BUNIONECTOMY Right    PORTA CATH INSERTION N/A 03/31/2018   Procedure: PORTA CATH INSERTION;  Surgeon: Algernon Huxley, MD;  Location: [facility];  Service: Cardiovascular;  Laterality: N/A;   TONSILLECTOMY     TRANSURETHRAL RESECTION OF BLADDER TUMOR N/A 06/20/2019   Procedure: TRANSURETHRAL  RESECTION OF BLADDER TUMOR (TURBT);  Surgeon: [name], MD;  Location: [facility];  Service: Urology;  Laterality: N/A;    Social History   Socioeconomic History   Marital status: Married    Spouse name: Not on file   Number of children: Not on file   Years of education: Not on file   Highest education level: Not on file  Occupational History   Not on file  Tobacco Use   Smoking status: Some Days    Packs/day: 0.15    Years: 10.00    Pack years: 1.50    Types: Cigarettes   Smokeless tobacco: Never   Tobacco comments:    2 weeks to use a pack  Vaping Use   Vaping Use: Some days   Substances: Mixture of cannabinoids   Devices: marijuana vape  Substance and Sexual Activity   Alcohol use: Yes    Comment: rare   Drug use: Yes    Types: Marijuana    Comment: quit opiates 5 years ago   Sexual activity: Yes    Birth control/protection: None  Other Topics Concern   Not on file  Social History Narrative   Not on file   Social Determinants of Health  Financial Resource Strain: Not on file  Food Insecurity: Not on file  Transportation Needs: Not on file  Physical Activity: Not on file  Stress: Not on file  Social Connections: Not on file  Intimate Partner Violence: Not on file    Family History  Adopted: Yes  Family history unknown: Yes     Current Outpatient Medications:    ALPRAZolam (XANAX) 1 MG tablet, TAKE 1 TABLET BY MOUTH THREE TIMES DAILY AS NEEDED, Disp: 90 tablet, Rfl: 2   amphetamine-dextroamphetamine (ADDERALL) 10 MG tablet, Take 1 tablet (10 mg total) by mouth 2 (two) times daily with a meal., Disp: 60 tablet, Rfl: 0   amphetamine-dextroamphetamine (ADDERALL) 10 MG tablet, Take 1 tablet (10 mg total) by mouth 2 (two) times daily with a meal., Disp: 60 tablet, Rfl: 0   amphetamine-dextroamphetamine (ADDERALL) 10 MG tablet, Take 1 tablet (10 mg total) by mouth 2 (two) times daily with a meal., Disp: 60 tablet, Rfl: 0   DULoxetine (CYMBALTA) 60 MG  capsule, Take 1 capsule (60 mg total) by mouth daily., Disp: 90 capsule, Rfl: 3   estradiol (VIVELLE-DOT) 0.1 MG/24HR patch, APPLY 1 PATCH TOPICALLY TWICE A WEEK, Disp: 24 patch, Rfl: 3   gabapentin (NEURONTIN) 300 MG capsule, TAKE 1 CAPSULE BY MOUTH THREE TIMES DAILY, Disp: 90 capsule, Rfl: 0   lansoprazole (PREVACID) 30 MG capsule, Take 1 capsule by mouth once daily, Disp: 90 capsule, Rfl: 3   levothyroxine (SYNTHROID) 175 MCG tablet, TAKE 1 TABLET BY MOUTH ONCE DAILY BEFORE BREAKFAST, Disp: 90 tablet, Rfl: 3   levothyroxine (SYNTHROID) 175 MCG tablet, TAKE 1 TABLET BY MOUTH ONCE DAILY IN THE MORNING BEFORE BREAKFAST, Disp: 90 tablet, Rfl: 0   lidocaine-prilocaine (EMLA) cream, Apply to affected area once, Disp: 30 g, Rfl: 3   ondansetron (ZOFRAN) 8 MG tablet, Take 8 mg by mouth every 8 (eight) hours as needed for nausea or vomiting., Disp: , Rfl:    progesterone (PROMETRIUM) 200 MG capsule, TAKE ONE CAPSULE BY MOUTH AT BEDTIME WITH FOOD FOR 12 DAYS SEQUENTIALLY PER 28 DAY CYCLE, Disp: 36 capsule, Rfl: 3   promethazine (PHENERGAN) 25 MG tablet, Take 0.5-1 tablets (12.5-25 mg total) by mouth every 12 (twelve) hours as needed for nausea or vomiting., Disp: 30 tablet, Rfl: 2   oxybutynin (DITROPAN XL) 15 MG 24 hr tablet, Take 1 tablet (15 mg total) by mouth daily., Disp: 30 tablet, Rfl: 0 No current facility-administered medications for this visit.  Facility-Administered Medications Ordered in Other Visits:    sodium chloride flush (NS) 0.9 % injection 10 mL, 10 mL, Intravenous, PRN, Sindy Guadeloupe, MD, 10 mL at 04/15/18 0825  Physical exam:  Vitals:   04/10/21 1512  BP: (!) 147/85  Pulse: 79  Resp: 20  Temp: (!) 96.8 F (36 C)  SpO2: 100%  Weight: 284 lb 11.2 oz (129.1 kg)   Physical Exam HENT:     Head: Normocephalic and atraumatic.  Eyes:     Pupils: Pupils are equal, round, and reactive to light.  Cardiovascular:     Rate and Rhythm: Normal rate and regular rhythm.     Heart  sounds: Normal heart sounds.  Pulmonary:     Effort: Pulmonary effort is normal.     Breath sounds: Normal breath sounds.  Abdominal:     General: Bowel sounds are normal.     Palpations: Abdomen is soft.  Musculoskeletal:     Cervical back: Normal range of motion.  Skin:    General:  Skin is warm and dry.  Neurological:     Mental Status: She is alert and oriented to person, place, and time.     CMP Latest Ref Rng & Units 04/10/2021  Glucose 70 - 99 mg/dL 88  BUN 6 - 20 mg/dL 11  Creatinine 0.44 - 1.00 mg/dL 0.65  Sodium 135 - 145 mmol/L 135  Potassium 3.5 - 5.1 mmol/L 3.4(L)  Chloride 98 - 111 mmol/L 102  CO2 22 - 32 mmol/L 23  Calcium 8.9 - 10.3 mg/dL 8.7(L)  Total Protein 6.5 - 8.1 g/dL 7.9  Total Bilirubin 0.3 - 1.2 mg/dL 0.4  Alkaline Phos 38 - 126 U/L 67  AST 15 - 41 U/L 25  ALT 0 - 44 U/L 29   CBC Latest Ref Rng & Units 04/10/2021  WBC 4.0 - 10.5 K/uL 8.4  Hemoglobin 12.0 - 15.0 g/dL 13.3  Hematocrit 36.0 - 46.0 % 37.7  Platelets 150 - 400 K/uL 362     Assessment and plan- Patient is a 36 y.o. female with adenocarcinoma of the cervix stage IIIc T2 N1 M0 s/p concurrent chemoradiation, currently in remission. who returns to clinic for ongoing surveillance.   Cervical cancer surveillance: Clinically patient is doing well with no concerning signs and symptoms of recurrence. She would like to transition her care back to [facility]. Last pelvic exam was with Dr. Christel Mormon. She requests to see Dr. Theora Gianotti at [facility] which can be scheduled at end of feb, beginning of march. She can follow up with medical oncology, Dr. Janese Banks in June 2023.   Radiation cystitis: will refer to hyperbaric oxygen therapy locally. Will refill oxybutynin today.   Vasomotor symptoms from premature menopause: On hormone replacement therapy. Skin reaction to adhesives. Will transition to oral estradiol daily and continue prometrium as prescribed.   Visit Diagnosis 1. Encounter for follow-up surveillance of cervical  cancer   2. Chronic radiation cystitis   3. Premature menopause on hormone replacement therapy    Beckey Rutter, DNP, AGNP-C [place] at [facility] [phone] (clinic) 04/10/2021

## 2021-04-11 ENCOUNTER — Encounter: Payer: Self-pay | Admitting: Oncology

## 2021-04-30 ENCOUNTER — Other Ambulatory Visit: Payer: Self-pay | Admitting: Oncology

## 2021-05-01 ENCOUNTER — Encounter: Payer: Self-pay | Admitting: Oncology

## 2021-05-28 ENCOUNTER — Other Ambulatory Visit: Payer: Self-pay

## 2021-05-28 ENCOUNTER — Inpatient Hospital Stay: Payer: Medicaid Other | Attending: Obstetrics and Gynecology | Admitting: Nurse Practitioner

## 2021-05-28 VITALS — BP 134/93 | HR 105 | Temp 98.7°F | Resp 19 | Wt 288.5 lb

## 2021-05-28 DIAGNOSIS — I10 Essential (primary) hypertension: Secondary | ICD-10-CM | POA: Diagnosis not present

## 2021-05-28 DIAGNOSIS — F17210 Nicotine dependence, cigarettes, uncomplicated: Secondary | ICD-10-CM | POA: Diagnosis not present

## 2021-05-28 DIAGNOSIS — Z79890 Hormone replacement therapy (postmenopausal): Secondary | ICD-10-CM | POA: Diagnosis not present

## 2021-05-28 DIAGNOSIS — Z6841 Body Mass Index (BMI) 40.0 and over, adult: Secondary | ICD-10-CM | POA: Diagnosis not present

## 2021-05-28 DIAGNOSIS — N3041 Irradiation cystitis with hematuria: Secondary | ICD-10-CM | POA: Insufficient documentation

## 2021-05-28 DIAGNOSIS — Z923 Personal history of irradiation: Secondary | ICD-10-CM | POA: Insufficient documentation

## 2021-05-28 DIAGNOSIS — E038 Other specified hypothyroidism: Secondary | ICD-10-CM

## 2021-05-28 DIAGNOSIS — R918 Other nonspecific abnormal finding of lung field: Secondary | ICD-10-CM

## 2021-05-28 DIAGNOSIS — F32A Depression, unspecified: Secondary | ICD-10-CM | POA: Diagnosis not present

## 2021-05-28 DIAGNOSIS — F419 Anxiety disorder, unspecified: Secondary | ICD-10-CM | POA: Insufficient documentation

## 2021-05-28 DIAGNOSIS — E063 Autoimmune thyroiditis: Secondary | ICD-10-CM | POA: Diagnosis not present

## 2021-05-28 DIAGNOSIS — Z8541 Personal history of malignant neoplasm of cervix uteri: Secondary | ICD-10-CM | POA: Insufficient documentation

## 2021-05-28 DIAGNOSIS — E28319 Asymptomatic premature menopause: Secondary | ICD-10-CM

## 2021-05-28 MED ORDER — PROGESTERONE 200 MG PO CAPS: ORAL_CAPSULE | ORAL | 3 refills | Status: AC

## 2021-05-28 NOTE — Patient Instructions (Signed)
Call and have appointment with Dr. Christel Mormon moved to August 2023. We will see you back in one year. [phone]

## 2021-05-28 NOTE — Progress Notes (Signed)
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 Positive pelvic LN  Subjective:  Kristy Gordon is a 36 y.o. G2 female, initially seen in consultation from Dr. Gilman Schmidt for cervical adenocarcinoma, returns to clinic for continued surveillance.   In the interim, she was seen by Dr. Christel Mormon at [facility] for surveillance, August 2022. She was last seen by Dr. Theora Gianotti in June 2021. PET scan was recommended at that time but was denied by insurance. She had CT C/A/P to follow up on pelvic symptoms and previously seen lung nodules.   01/02/20- CT Chest abdomen pelvis  Lung nodules had resolved. Minimal fat stranding in pelvis similar to previous. No evidence of metastatic disease or lymphadenopathy.   Treatment Summary:  05/19/18 ChemoRT 45 Gy to the pelvis with boost to 50 Gy to the enlarged pelvic nodes.(Dr.Glenn Chrystal)  2/17 / 06/07/18 Brachytherapy (Cervix, T&O/R) Total Dose 2750 cGy / 5 fractions (each fraction 550 cGy)  She was seen by medical oncology, myself in January 2023. She was referred for hyperbaric for ongoing radiation cystitis symptoms which are stable. She continues oxybutynin which offers some symptomatic improvement. She was experiencing skin reaction to adhesives and was transitioned from topical estrogen patch to oral estrogen. She is experiencing some hot flashes. No pelvic pain, discharge, or bleeding. Has been using dilators. Not sexually active.      Gyn-Oncology History:  Kristy Gordon is a pleasant G4 female, initially seen in consultation from Dr. Gilman Schmidt for cervical cancer. Please refer to prior notes for complete details.  Seen by Dr Gilman Schmidt 12/13 in office and and cervix firm and friable.  Multiple biopsies showed adenocarcinoma. PAP pending. States today that she has pain in left pelvis.   03/23/2018 HIV Screen 4th Generation wRfx Non Reactive Non Reactive     She was seen by  Dr. Fransisca Connors.  Pelvic exam showed anteverted cervix that was replaced by tumor and extension of cancer into the left parametrium and possibly to the pelvic sidewall. We discussed that preservation of fertility is not possible with radiation treatment. She lost several pregnancies and does not have any children, but no longer desires fertility and husband confirmed this. So this is not an issue.  PET CT scan on 03/28/2018 showed 6 cm hypermetabolic cervical mass consistent with primary cervical carcinoma.  Mild hypermetabolic bilateral parametrial right perirectal bilateral iliac lymph nodes consistent with metastatic disease.  No evidence of metastatic disease within the abdomen chest or neck.  She received concurrent cisplatin & radiation 04/11/2018- 05/19/2018 (6 cycles) followed by vaginal brachytherapy at [facility] completed 06/07/2018.   PET - 08/31/2018 for restaging IMPRESSION: 1. Clear interval response to therapy. Cervix has decreased substantially in size in the interval with no substantial hypermetabolism today although SUV measurement is difficult due to misregistration with adjacent radiolabeled urine in the bladder. 2. Interval resolution of the hypermetabolic pelvic lymphadenopathy seen previously. 3. No new or progressive findings on today's study.  Restaging pet showed no substantial hypermetabolism of cervix and resolution of hypermetabolic pelvic lymphadenopathy. Since completion of radiation she has complained of left lower quadrant pain. She has been using vaginal dilators and has been receiving pelvic floor PT which somewhat improved her pain though has missed several appointments.   CT in 11/22/2018 for vaginal bleeding and LLQ pain demonstrated  1. Unchanged post treatment appearance of the cervix and pelvis, with no change in tiny pelvic sidewall and iliac lymph  nodes, previously enlarged and hypermetabolic. 2. No evidence of metastatic disease in the chest, abdomen, or pelvis. 3. No  specific findings in the abdomen or pelvis to explain left lower quadrant pain. Large burden of stool in the colon. 4. Soft tissue in the anterior mediastinum, consistent with thymus and likely reflecting thymic rebound in the setting of recent chemotherapy.  12/02/2018 She saw Dr. Baruch Gouty and recommendation for start dilator therapy.   She has gone to Pelvic floor PT for 6-8 sessions.  She has had issues with vasomotor symptoms and treated with Vivelle-Dot 0.075 mg/24 hr with progesterone 200 mg 12 days of month. Due to continued symptoms TFTs were obtained and TSH was elevated and they recommended Increase levothyroxine from 125 to 175 mcg daily.   Ref Range & Units 04/04/2019  TSH 0.450 - 4.500 uIU/mL 7.970High     02/01/2020 Pap NILM/HRHPV negative  02/20/2019 PET IMPRESSION: 1. Two small subpleural pulmonary nodules are not seen on prior. Recommend close attention on follow-up. 2. No hypermetabolic uterine cervical tissue to suggest local recurrence. 3. No hypermetabolic pelvic or retroperitoneal lymph nodes to suggest metastatic disease recurrence. 4. Diffuse hypermetabolic thyroid gland activity consistent with thyroiditis.  06/09/2019 CT for hematuria evaluation 1. No CT findings of the abdomen or pelvis to explain left-sided flank pain or hematuria. No evidence of urinary tract calculus or hydronephrosis. No urinary tract filling defects on delayed phase imaging. 2. No CT abnormality of the cervix or pelvis given history of malignancy. 3.  Hepatomegaly and hepatic steatosis.  06/20/2019 she underwent transurethral bladder biopsy and pathology c/w radiation cystitis.   08/25/2019 she was seen by Dr. Janese Banks who is treating hypokalemia and non-intractable vomiting with nausea, uncertain etiology.   With regard to radiation cystitis she had been treated with oxybutynin (DITROPAN XL) 15 MG 24 hr tablet and at one time also Myrbetriq, but no significant relief. She has gross small amount  hematuria a few times a week, burning with urination, bladder spasms, and frequency. Evaluation as noted above. She has had multiple urine cultures and results have not shown definitive infection. No other procedures or treatments have been performed. She has not had hyperbaric therapy. Per her report her Urologist recommended referral to [facility]  She saw Dr. Christel Mormon who recommended hyperbaric oxygen treatments for radiation cystitis but patient declined due to commute/time commitment.     Problem List: Patient Active Problem List   Diagnosis Date Noted   Obesity, Class III, BMI 40-49.9 (morbid obesity) ([place]) 05/21/2020   Tenesmus (rectal) 04/24/2020   Pelvic floor dysfunction 04/10/2019   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   Bladder spasms 02/27/2019   Microscopic hematuria 02/27/2019   Vaginal pain 11/18/2018   Premature menopause on hormone replacement therapy 10/20/2018   Adjustment disorder with mixed anxiety and depressed mood 10/20/2018   Goiter 05/20/2018   Hypothyroidism 05/20/2018   Tobacco use 05/20/2018   Cervical cancer ([place]) 05/11/2018   Neuropathy 05/07/2018   Nausea without vomiting 04/25/2018   Iron deficiency anemia 04/11/2018   Goals of care, counseling/discussion 03/28/2018   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[place]) 03/23/2018   Hypothyroidism due to Hashimoto's thyroiditis 03/18/2018   H/O fetal demise, not currently pregnant 03/18/2018   Lower abdominal pain 01/04/2018   Anxiety 09/05/2015   Clinical depression 09/05/2015   Big thyroid 09/05/2015   Cannot sleep 09/05/2015   Adiposity 09/05/2015   Nondependent opioid abuse in remission ([place]) 09/05/2015   Disorder of thyroid 09/05/2015   Tobacco use disorder 09/05/2015    Past  Medical History: Past Medical History:  Diagnosis Date   Anxiety    Cervical cancer ([place]) 03/2018   Hashimoto's thyroiditis    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   History of cervical cancer    Hypothyroid    Low back pain     Past  Surgical History: Past Surgical History:  Procedure Laterality Date   BUNIONECTOMY Right    PORTA CATH INSERTION N/A 03/31/2018   Procedure: PORTA CATH INSERTION;  Surgeon: Algernon Huxley, MD;  Location: [place] [facility];  Service: Cardiovascular;  Laterality: N/A;   TONSILLECTOMY     TRANSURETHRAL RESECTION OF BLADDER TUMOR N/A 06/20/2019   Procedure: TRANSURETHRAL RESECTION OF BLADDER TUMOR (TURBT);  Surgeon: [name], MD;  Location: [facility];  Service: Urology;  Laterality: N/A;    OB History:  OB History  Gravida Para Term Preterm AB Living  10 2   2 8  0  SAB IAB Ectopic Multiple Live Births  8       0    # Outcome Date GA Lbr Len/2nd Weight Sex Delivery Anes PTL Lv  10 SAB           9 SAB           8 SAB           7 SAB           6 SAB           5 SAB           4 SAB           3 SAB           2 Preterm      Vag-Spont   FD  1 Preterm      Vag-Spont   FD    Family History: Family History  Adopted: Yes  Family history unknown: Yes    Social History: Social History   Socioeconomic History   Marital status: Married    Spouse name: Not on file   Number of children: Not on file   Years of education: Not on file   Highest education level: Not on file  Occupational History   Not on file  Tobacco Use   Smoking status: Some Days    Packs/day: 0.15    Years: 10.00    Pack years: 1.50    Types: Cigarettes   Smokeless tobacco: Never   Tobacco comments:    2 weeks to use a pack  Vaping Use   Vaping Use: Some days   Substances: Mixture of cannabinoids   Devices: marijuana vape  Substance and Sexual Activity   Alcohol use: Yes    Comment: rare   Drug use: Yes    Types: Marijuana    Comment: quit opiates 5 years ago   Sexual activity: Yes    Birth control/protection: None  Other Topics Concern   Not on file  Social History Narrative   Not on file   Social Determinants of Health   Financial Resource Strain: Not on file  Food Insecurity: Not on  file  Transportation Needs: Not on file  Physical Activity: Not on file  Stress: Not on file  Social Connections: Not on file  Intimate Partner Violence: Not on file    Allergies: Allergies  Allergen Reactions   Amoxicillin Rash    Did it involve swelling of the face/tongue/throat, SOB, or low BP? No Did it involve sudden or  severe rash/hives, skin peeling, or any reaction on the inside of your mouth or nose? No Did you need to seek medical attention at a hospital or doctor's office? No When did it last happen?  Childhood     If all above answers are "NO", may proceed with cephalosporin use.    Penicillins Hives    Did it involve swelling of the face/tongue/throat, SOB, or low BP? No Did it involve sudden or severe rash/hives, skin peeling, or any reaction on the inside of your mouth or nose? No Did you need to seek medical attention at a hospital or doctor's office? No When did it last happen?  Childhood     If all above answers are NO, may proceed with cephalosporin use.     Current Medications: Current Outpatient Medications  Medication Sig Dispense Refill   ALPRAZolam (XANAX) 1 MG tablet TAKE 1 TABLET BY MOUTH THREE TIMES DAILY AS NEEDED 90 tablet 2   amphetamine-dextroamphetamine (ADDERALL) 10 MG tablet Take 1 tablet (10 mg total) by mouth 2 (two) times daily with a meal. 60 tablet 0   amphetamine-dextroamphetamine (ADDERALL) 10 MG tablet Take 1 tablet (10 mg total) by mouth 2 (two) times daily with a meal. 60 tablet 0   amphetamine-dextroamphetamine (ADDERALL) 10 MG tablet Take 1 tablet (10 mg total) by mouth 2 (two) times daily with a meal. 60 tablet 0   DULoxetine (CYMBALTA) 60 MG capsule Take 1 capsule (60 mg total) by mouth daily. 90 capsule 3   estradiol (ESTRACE) 1 MG tablet Take 1 tablet (1 mg total) by mouth daily. 30 tablet 5   gabapentin (NEURONTIN) 300 MG capsule TAKE 1 CAPSULE BY MOUTH THREE TIMES DAILY 90 capsule 0   lansoprazole (PREVACID) 30 MG capsule Take  1 capsule by mouth once daily 90 capsule 3   levothyroxine (SYNTHROID) 175 MCG tablet TAKE 1 TABLET BY MOUTH ONCE DAILY BEFORE BREAKFAST 90 tablet 3   levothyroxine (SYNTHROID) 175 MCG tablet TAKE 1 TABLET BY MOUTH ONCE DAILY IN THE MORNING BEFORE BREAKFAST 90 tablet 0   lidocaine-prilocaine (EMLA) cream Apply to affected area once 30 g 3   ondansetron (ZOFRAN) 8 MG tablet Take 8 mg by mouth every 8 (eight) hours as needed for nausea or vomiting.     oxybutynin (DITROPAN XL) 15 MG 24 hr tablet Take 1 tablet (15 mg total) by mouth daily. 90 tablet 3   progesterone (PROMETRIUM) 200 MG capsule TAKE ONE CAPSULE BY MOUTH AT BEDTIME WITH FOOD FOR 12 DAYS SEQUENTIALLY PER 28 DAY CYCLE 36 capsule 3   promethazine (PHENERGAN) 25 MG tablet Take 0.5-1 tablets (12.5-25 mg total) by mouth every 12 (twelve) hours as needed for nausea or vomiting. 30 tablet 2   No current facility-administered medications for this visit.   Facility-Administered Medications Ordered in Other Visits  Medication Dose Route Frequency Provider Last Rate Last Admin   sodium chloride flush (NS) 0.9 % injection 10 mL  10 mL Intravenous PRN Sindy Guadeloupe, MD   10 mL at 04/15/18 0825   Review of Systems General:  no complaints Skin: no complaints Eyes: no complaints HEENT: no complaints Breasts: no complaints Pulmonary: no complaints Cardiac: no complaints Gastrointestinal: no complaints Genitourinary/Sexual: hx of cystitis Ob/Gyn: no complaints Musculoskeletal: no complaints Hematology: no complaints Neurologic/Psych: no complaints   Objective:  Physical Examination:  BP (!) 134/93    Pulse (!) 105    Temp 98.7 F (37.1 C)    Resp 19    Wt  288 lb 8 oz (130.9 kg)    LMP  (LMP Unknown)    SpO2 99%    BMI 45.19 kg/m    ECOG Performance Status: 1 - Symptomatic but completely ambulatory  GENERAL: Patient is a well appearing female in no acute distress HEENT:  PERRL, neck supple with midline trachea.  NODES:   Submandibular lymph nodes approximately 2 cm, smooth, mobile, nontender. No axillary, inguinal lymphadenopathy.   LUNGS:  Clear to auscultation bilaterally.   HEART:  Regular rate and rhythm.  ABDOMEN:  Soft, Nontender. No palpable masses, ascites, hernias.  EXTREMITIES:  No peripheral edema.   SKIN:  Clear with no obvious rashes or skin changes. No nail dyscrasia. NEURO:  Nonfocal. Well oriented.  Appropriate affect.  Pelvic: Exam chaperoned by PA, Abigail Butts: no lesions. Cervix: flat. Vagina: no lesions, discharge, or bleeding. Vagina shortned to approximately 4-5 cm. Atrophic. Uterus: nontender. Adnexa: no palpable masses. Parametria smooth. Rectovaginal: deferred.    Labs Lab Results  Component Value Date   WBC 8.4 04/10/2021   HGB 13.3 04/10/2021   HCT 37.7 04/10/2021   MCV 90.0 04/10/2021   PLT 362 04/10/2021     Chemistry      Component Value Date/Time   NA 135 04/10/2021 1449   NA 138 09/11/2015 0954   NA 142 09/11/2011 1701   K 3.4 (L) 04/10/2021 1449   K 4.2 09/11/2011 1701   CL 102 04/10/2021 1449   CL 109 (H) 09/11/2011 1701   CO2 23 04/10/2021 1449   CO2 22 09/11/2011 1701   BUN 11 04/10/2021 1449   BUN 11 09/11/2015 0954   BUN 10 09/11/2011 1701   CREATININE 0.65 04/10/2021 1449   CREATININE 0.67 09/11/2011 1701   GLU 84 04/16/2010 0000      Component Value Date/Time   CALCIUM 8.7 (L) 04/10/2021 1449   CALCIUM 9.3 09/11/2011 1701   ALKPHOS 67 04/10/2021 1449   ALKPHOS 101 09/11/2011 1701   AST 25 04/10/2021 1449   AST 28 09/11/2011 1701   ALT 29 04/10/2021 1449   ALT 20 09/11/2011 1701   BILITOT 0.4 04/10/2021 1449   BILITOT 0.5 09/11/2015 0954   BILITOT 0.3 09/11/2011 1701          Assessment:  Kristy Gordon is a 36 y.o. female diagnosed with at least stage IIB cervical adenocarcinoma s/p primary chemoradiation followed by vaginal brachytherapy with excellent response based on PET scan and clinically NED on exam.   Chronic radiation  cystitis  Premature menopause & vasomotor symptoms- on HRT  Sexual dysfunction- vaginal agglutination s/p pelvic floor PT w/o improvement. Currently performing intermittent dilator exercises.   Pulmonary nodules- resolved on previous CT from 12/2019  Hypertension, asymtomatic  Body mass index is 45.19 kg/m.  Medical co-morbidities complicating care: anxiety, depression, thyroiditis, smoker.  Plan:   Problem List Items Addressed This Visit   None  Continue surveillance per NCCN Guidelines. She will continue to alternate with Dr. Christel Mormon every 6 months. She has follow up scheduled at [facility] in March 2023. I've advised her that she can push that appointment out to 11/2021. We will see her back 6 months from that time.  At some point we may be able to begin alternating surveillance with Dr. Gilman Schmidt.   No additional imaging of previously seen pulmonary nodules as they had resolved on CT from September 2021.   I provided her with vaginal dilator today in larger size. She can continue vaginal dilator exercises. No improvement with  pelvic floor pt.   Radiation cystitis- symptoms are stable. Previously referred for hyperbaric treatments at [facility]. I encouraged her to consider treatment  HRT- d/t treatments for cancer. Continue oral estradiol. Given hot flashes, may need to consider, increasing dose if symptoms not controlled. I refilled her prometrium add back today.   Thyroid levels were previously elevated. Plan to recheck TSH at next visit with Dr. Janese Banks in June.   The patient's diagnosis, an outline of the further diagnostic and laboratory studies which will be required, the recommendation, and alternatives were discussed. We also reviewed her PET scan which was reassuring.   All questions were answered to the patient's satisfaction.  Verlon Au, NP

## 2021-06-09 ENCOUNTER — Other Ambulatory Visit: Payer: Self-pay

## 2021-06-09 ENCOUNTER — Ambulatory Visit
Admission: RE | Admit: 2021-06-09 | Discharge: 2021-06-09 | Disposition: A | Discharge status: Home / Self Care | Payer: Medicaid Other | Source: Ambulatory Visit | Attending: Nurse Practitioner | Admitting: Nurse Practitioner

## 2021-06-09 DIAGNOSIS — R918 Other nonspecific abnormal finding of lung field: Secondary | ICD-10-CM | POA: Diagnosis not present

## 2021-06-09 DIAGNOSIS — R911 Solitary pulmonary nodule: Secondary | ICD-10-CM | POA: Diagnosis not present

## 2021-06-09 MED ORDER — IOHEXOL 300 MG/ML  SOLN: 75.0000 mL | Freq: Once | INTRAMUSCULAR | Status: DC | PRN

## 2021-06-09 MED ORDER — IOHEXOL 350 MG/ML SOLN
75.0000 mL | Freq: Once | INTRAVENOUS | Status: AC | PRN
  Administered 2021-06-09: 75 mL via INTRAVENOUS

## 2021-06-13 ENCOUNTER — Other Ambulatory Visit: Payer: Self-pay | Admitting: Nurse Practitioner

## 2021-06-13 DIAGNOSIS — R918 Other nonspecific abnormal finding of lung field: Secondary | ICD-10-CM

## 2021-06-13 NOTE — Progress Notes (Signed)
Done informed patient, will pass message on to schedulers

## 2021-06-17 ENCOUNTER — Ambulatory Visit: Payer: Medicaid Other | Admitting: Physician Assistant

## 2021-06-17 NOTE — Progress Notes (Deleted)
? ?I,Sha'taria Zea Kostka,acting as a Education administrator for [employer], PA-C.,have documented all relevant documentation on the behalf of Mikey Kirschner, PA-C,as directed by  Mikey Kirschner, PA-C while in the presence of Mikey Kirschner, PA-C. ? ?Established Patient Office Visit ? ?Subjective:  ?Patient ID: Kristy Gordon, female    DOB: 30-Nov-1985  Age: 36 y.o. MRN: [id number] ? ?CC: No chief complaint on file. ? ? ?HPI ?Kristy Gordon presents for medication refills.  ? ?Past Medical History:  ?Diagnosis Date  ? Anxiety   ? Cervical cancer ([place]) 03/2018  ? Hashimoto's thyroiditis   ? Hearing loss of left ear   ? from chemo  ? History of cervical cancer   ? Hypothyroid   ? Low back pain   ? ? ?Past Surgical History:  ?Procedure Laterality Date  ? BUNIONECTOMY Right   ? PORTA CATH INSERTION N/A 03/31/2018  ? Procedure: PORTA CATH INSERTION;  Surgeon: Algernon Huxley, MD;  Location: [facility];  Service: Cardiovascular;  Laterality: N/A;  ? TONSILLECTOMY    ? TRANSURETHRAL RESECTION OF BLADDER TUMOR N/A 06/20/2019  ? Procedure: TRANSURETHRAL RESECTION OF BLADDER TUMOR (TURBT);  Surgeon: [name], MD;  Location: [facility];  Service: Urology;  Laterality: N/A;  ? ? ?Family History  ?Adopted: Yes  ?Family history unknown: Yes  ? ? ?Social History  ? ?Socioeconomic History  ? Marital status: Married  ?  Spouse name: Not on file  ? Number of children: Not on file  ? Years of education: Not on file  ? Highest education level: Not on file  ?Occupational History  ? Not on file  ?Tobacco Use  ? Smoking status: Some Days  ?  Packs/day: 0.15  ?  Years: 10.00  ?  Pack years: 1.50  ?  Types: Cigarettes  ? Smokeless tobacco: Never  ? Tobacco comments:  ?  2 weeks to use a pack  ?Vaping Use  ? Vaping Use: Some days  ? Substances: Mixture of cannabinoids  ? Devices: marijuana vape  ?Substance and Sexual Activity  ? Alcohol use: Yes  ?  Comment: rare  ? Drug use: Yes  ?  Types: Marijuana  ?  Comment: quit opiates 5 years ago  ? Sexual  activity: Yes  ?  Birth control/protection: None  ?Other Topics Concern  ? Not on file  ?Social History Narrative  ? Not on file  ? ?Social Determinants of Health  ? ?Financial Resource Strain: Not on file  ?Food Insecurity: Not on file  ?Transportation Needs: Not on file  ?Physical Activity: Not on file  ?Stress: Not on file  ?Social Connections: Not on file  ?Intimate Partner Violence: Not on file  ? ? ?Outpatient Medications Prior to Visit  ?Medication Sig Dispense Refill  ? ALPRAZolam (XANAX) 1 MG tablet TAKE 1 TABLET BY MOUTH THREE TIMES DAILY AS NEEDED 90 tablet 2  ? amphetamine-dextroamphetamine (ADDERALL) 10 MG tablet Take 1 tablet (10 mg total) by mouth 2 (two) times daily with a meal. 60 tablet 0  ? amphetamine-dextroamphetamine (ADDERALL) 10 MG tablet Take 1 tablet (10 mg total) by mouth 2 (two) times daily with a meal. 60 tablet 0  ? amphetamine-dextroamphetamine (ADDERALL) 10 MG tablet Take 1 tablet (10 mg total) by mouth 2 (two) times daily with a meal. 60 tablet 0  ? DULoxetine (CYMBALTA) 60 MG capsule Take 1 capsule (60 mg total) by mouth daily. 90 capsule 3  ? estradiol (ESTRACE) 1 MG tablet Take 1 tablet (  1 mg total) by mouth daily. 30 tablet 5  ? gabapentin (NEURONTIN) 300 MG capsule TAKE 1 CAPSULE BY MOUTH THREE TIMES DAILY 90 capsule 0  ? lansoprazole (PREVACID) 30 MG capsule Take 1 capsule by mouth once daily 90 capsule 3  ? levothyroxine (SYNTHROID) 175 MCG tablet TAKE 1 TABLET BY MOUTH ONCE DAILY BEFORE BREAKFAST 90 tablet 3  ? levothyroxine (SYNTHROID) 175 MCG tablet TAKE 1 TABLET BY MOUTH ONCE DAILY IN THE MORNING BEFORE BREAKFAST 90 tablet 0  ? lidocaine-prilocaine (EMLA) cream Apply to affected area once 30 g 3  ? ondansetron (ZOFRAN) 8 MG tablet Take 8 mg by mouth every 8 (eight) hours as needed for nausea or vomiting.    ? oxybutynin (DITROPAN XL) 15 MG 24 hr tablet Take 1 tablet (15 mg total) by mouth daily. 90 tablet 3  ? progesterone (PROMETRIUM) 200 MG capsule TAKE ONE CAPSULE BY  MOUTH AT BEDTIME WITH FOOD FOR 12 DAYS SEQUENTIALLY PER 28 DAY CYCLE 36 capsule 3  ? promethazine (PHENERGAN) 25 MG tablet Take 0.5-1 tablets (12.5-25 mg total) by mouth every 12 (twelve) hours as needed for nausea or vomiting. 30 tablet 2  ? ?Facility-Administered Medications Prior to Visit  ?Medication Dose Route Frequency Provider Last Rate Last Admin  ? sodium chloride flush (NS) 0.9 % injection 10 mL  10 mL Intravenous PRN Sindy Guadeloupe, MD   10 mL at 04/15/18 0825  ? ? ?Allergies  ?Allergen Reactions  ? Amoxicillin Rash  ?  Did it involve swelling of the face/tongue/throat, SOB, or low BP? No ?Did it involve sudden or severe rash/hives, skin peeling, or any reaction on the inside of your mouth or nose? No ?Did you need to seek medical attention at a hospital or doctor's office? No ?When did it last happen?  Childhood     ?If all above answers are "NO", may proceed with cephalosporin use. ?  ? Penicillins Hives  ?  Did it involve swelling of the face/tongue/throat, SOB, or low BP? No ?Did it involve sudden or severe rash/hives, skin peeling, or any reaction on the inside of your mouth or nose? No ?Did you need to seek medical attention at a hospital or doctor's office? No ?When did it last happen?  Childhood     ?If all above answers are ?NO?, may proceed with cephalosporin use. ?  ? ? ?ROS ?Review of Systems ? ?  ?Objective:  ?  ?Physical Exam ? ?LMP  (LMP Unknown)  ?Wt Readings from Last 3 Encounters:  ?05/28/21 288 lb 8 oz (130.9 kg)  ?04/10/21 284 lb 11.2 oz (129.1 kg)  ?08/21/20 270 lb (122.5 kg)  ? ? ? ?Health Maintenance Due  ?Topic Date Due  ? COVID-19 Vaccine (1) Never done  ? Hepatitis C Screening  Never done  ? TETANUS/TDAP  02/22/2011  ? INFLUENZA VACCINE  11/04/2020  ? ? ?There are no preventive care reminders to display for this patient. ? ?Lab Results  ?Component Value Date  ? TSH 7.970 (H) 11/08/2018  ? ?Lab Results  ?Component Value Date  ? WBC 8.4 04/10/2021  ? HGB 13.3 04/10/2021  ? HCT 37.7  04/10/2021  ? MCV 90.0 04/10/2021  ? PLT 362 04/10/2021  ? ?Lab Results  ?Component Value Date  ? NA 135 04/10/2021  ? K 3.4 (L) 04/10/2021  ? CO2 23 04/10/2021  ? GLUCOSE 88 04/10/2021  ? BUN 11 04/10/2021  ? CREATININE 0.65 04/10/2021  ? BILITOT 0.4 04/10/2021  ? ALKPHOS  67 04/10/2021  ? AST 25 04/10/2021  ? ALT 29 04/10/2021  ? PROT 7.9 04/10/2021  ? ALBUMIN 3.9 04/10/2021  ? CALCIUM 8.7 (L) 04/10/2021  ? ANIONGAP 10 04/10/2021  ? ?Lab Results  ?Component Value Date  ? CHOL 200 04/16/2010  ? ?Lab Results  ?Component Value Date  ? HDL 56 04/16/2010  ? ?Lab Results  ?Component Value Date  ? [place] 110 04/16/2010  ? ?Lab Results  ?Component Value Date  ? TRIG 172 (A) 04/16/2010  ? ?No results found for: CHOLHDL ?No results found for: HGBA1C ? ?  ?Assessment & Plan:  ? ?Problem List Items Addressed This Visit   ?None ? ? ?No orders of the defined types were placed in this encounter. ? ? ?Follow-up: No follow-ups on file.  ? ? ?Beverlee Nims, [place] ?

## 2021-06-27 ENCOUNTER — Other Ambulatory Visit: Payer: Self-pay | Admitting: Oncology

## 2021-07-10 ENCOUNTER — Inpatient Hospital Stay: Payer: Medicaid Other | Attending: Oncology

## 2021-07-10 DIAGNOSIS — C539 Malignant neoplasm of cervix uteri, unspecified: Secondary | ICD-10-CM | POA: Insufficient documentation

## 2021-07-10 DIAGNOSIS — Z95828 Presence of other vascular implants and grafts: Secondary | ICD-10-CM

## 2021-07-10 DIAGNOSIS — Z452 Encounter for adjustment and management of vascular access device: Secondary | ICD-10-CM | POA: Diagnosis present

## 2021-07-10 MED ORDER — HEPARIN SOD (PORK) LOCK FLUSH 100 UNIT/ML IV SOLN
500.0000 [IU] | Freq: Once | INTRAVENOUS | Status: AC
  Administered 2021-07-10: 500 [IU] via INTRAVENOUS
  Filled 2021-07-10: qty 5

## 2021-07-10 MED ORDER — SODIUM CHLORIDE 0.9% FLUSH
10.0000 mL | Freq: Once | INTRAVENOUS | Status: AC
  Administered 2021-07-10: 10 mL via INTRAVENOUS
  Filled 2021-07-10: qty 10

## 2021-07-14 ENCOUNTER — Other Ambulatory Visit: Payer: Self-pay | Admitting: Family Medicine

## 2021-07-15 NOTE — Telephone Encounter (Signed)
LOV 08-21-20 ?NOV 08-11-21  ?LR 02-25-21 with 2 refills  ?

## 2021-08-11 ENCOUNTER — Ambulatory Visit (INDEPENDENT_AMBULATORY_CARE_PROVIDER_SITE_OTHER): Payer: Medicaid Other | Admitting: Family Medicine

## 2021-08-11 VITALS — BP 112/77 | HR 101 | Temp 98.3°F | Wt 295.0 lb

## 2021-08-11 DIAGNOSIS — C539 Malignant neoplasm of cervix uteri, unspecified: Secondary | ICD-10-CM | POA: Diagnosis not present

## 2021-08-11 DIAGNOSIS — N3040 Irradiation cystitis without hematuria: Secondary | ICD-10-CM | POA: Diagnosis not present

## 2021-08-11 DIAGNOSIS — Z6841 Body Mass Index (BMI) 40.0 and over, adult: Secondary | ICD-10-CM

## 2021-08-11 DIAGNOSIS — F4323 Adjustment disorder with mixed anxiety and depressed mood: Secondary | ICD-10-CM | POA: Diagnosis not present

## 2021-08-11 DIAGNOSIS — F988 Other specified behavioral and emotional disorders with onset usually occurring in childhood and adolescence: Secondary | ICD-10-CM | POA: Diagnosis not present

## 2021-08-11 DIAGNOSIS — F419 Anxiety disorder, unspecified: Secondary | ICD-10-CM

## 2021-08-11 DIAGNOSIS — E66813 Obesity, class 3: Secondary | ICD-10-CM

## 2021-08-11 DIAGNOSIS — E063 Autoimmune thyroiditis: Secondary | ICD-10-CM

## 2021-08-11 DIAGNOSIS — E038 Other specified hypothyroidism: Secondary | ICD-10-CM | POA: Diagnosis not present

## 2021-08-11 DIAGNOSIS — E049 Nontoxic goiter, unspecified: Secondary | ICD-10-CM

## 2021-08-11 NOTE — Progress Notes (Signed)
?  ? ? ?Established patient visit ? ? ?Patient: Kristy Gordon   DOB: [birth year]   36 y.o. Female  MRN: [id number] ?Visit Date: 08/11/2021 ? ?Today's healthcare provider: Wilhemena Durie, MD  ? ?No chief complaint on file. ? ?Subjective  ?  ?HPI  ?Patient comes in today for follow-up.  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  She is very happy with it.  Her marriage is going well and she and her husband are doing well.  She is frustrated with weight gain and would like to discuss weight loss options.  She does not know why she continues to gain weight.  She has no other issues today.  She has chronic pains managed by her oncologist for her metastatic cervical cancer. ?Follow up for Attention deficit disorder, unspecified hyperactivity presence: ? ?The patient was last seen for this 1 years ago. ?Changes made at last visit include; Stable on Adderall 10 mg. ? ?----------------------------------------------------------------------------------------- ?Anxiety, Follow-up ? ?She was last seen for anxiety 1 years ago. ?Changes made at last visit include; Started duloxetine which also might help her chronic pain due to radiation therapy. ? ?Symptoms: ?No chest pain No difficulty concentrating  ?No dizziness No fatigue  ?No feelings of losing control Yes insomnia  ?No irritable No palpitations  ?No panic attacks No racing thoughts  ?No shortness of breath No sweating  ?No tremors/shakes   ? ?GAD-7 Results ?   ? View : No data to display.  ?  ?  ?  ? ? ?PHQ-9 Scores ? ?  12/30/2016  ?  2:40 PM 09/09/2015  ?  4:27 PM  ?PHQ9 SCORE ONLY  ?PHQ-9 Total Score 6 0  ? ? ?--------------------------------------------------------------------------------------------------- ?Hypothyroid, follow-up ? ?Lab Results  ?Component Value Date  ? TSH 7.970 (H) 11/08/2018  ? TSH 9.260 (H) 02/08/2018  ? TSH 22.880 (H) 06/14/2017  ? ? ?Wt Readings from Last 3 Encounters:  ?08/11/21 295 lb (133.8 kg)  ?05/28/21 288 lb 8 oz (130.9 kg)   ?04/10/21 284 lb 11.2 oz (129.1 kg)  ? ? ?She was last seen for hypothyroid 11/08/2018.  ?Management since that visit includes; taking levothyroxine 175 mcg. ? ?----------------------------------------------------------------------------------------- ? ? ?Medications: ?Outpatient Medications Prior to Visit  ?Medication Sig  ? ALPRAZolam (XANAX) 1 MG tablet TAKE 1 TABLET BY MOUTH THREE TIMES DAILY AS NEEDED  ? amphetamine-dextroamphetamine (ADDERALL) 10 MG tablet Take 1 tablet (10 mg total) by mouth 2 (two) times daily with a meal.  ? amphetamine-dextroamphetamine (ADDERALL) 10 MG tablet Take 1 tablet (10 mg total) by mouth 2 (two) times daily with a meal.  ? amphetamine-dextroamphetamine (ADDERALL) 10 MG tablet Take 1 tablet (10 mg total) by mouth 2 (two) times daily with a meal.  ? DULoxetine (CYMBALTA) 60 MG capsule Take 1 capsule (60 mg total) by mouth daily.  ? estradiol (ESTRACE) 1 MG tablet Take 1 tablet (1 mg total) by mouth daily.  ? gabapentin (NEURONTIN) 300 MG capsule TAKE 1 CAPSULE BY MOUTH THREE TIMES DAILY  ? levothyroxine (SYNTHROID) 175 MCG tablet TAKE 1 TABLET BY MOUTH ONCE DAILY BEFORE BREAKFAST  ? levothyroxine (SYNTHROID) 175 MCG tablet TAKE 1 TABLET BY MOUTH ONCE DAILY IN THE MORNING BEFORE BREAKFAST  ? lidocaine-prilocaine (EMLA) cream Apply to affected area once  ? ondansetron (ZOFRAN) 8 MG tablet Take 8 mg by mouth every 8 (eight) hours as needed for nausea or vomiting.  ? oxybutynin (DITROPAN XL) 15 MG 24 hr tablet Take 1 tablet (15  mg total) by mouth daily.  ? progesterone (PROMETRIUM) 200 MG capsule TAKE ONE CAPSULE BY MOUTH AT BEDTIME WITH FOOD FOR 12 DAYS SEQUENTIALLY PER 28 DAY CYCLE  ? promethazine (PHENERGAN) 25 MG tablet Take 0.5-1 tablets (12.5-25 mg total) by mouth every 12 (twelve) hours as needed for nausea or vomiting.  ? [DISCONTINUED] lansoprazole (PREVACID) 30 MG capsule Take 1 capsule by mouth once daily  ? ?Facility-Administered Medications Prior to Visit  ?Medication Dose  Route Frequency Provider  ? sodium chloride flush (NS) 0.9 % injection 10 mL  10 mL Intravenous PRN Sindy Guadeloupe, MD  ? ? ?Review of Systems  ?Constitutional:  Negative for appetite change, chills, fatigue and fever.  ?Respiratory:  Negative for chest tightness and shortness of breath.   ?Cardiovascular:  Negative for chest pain and palpitations.  ?Gastrointestinal:  Negative for abdominal pain, nausea and vomiting.  ?Neurological:  Negative for dizziness and weakness.  ? ?  ?  Objective  ?  ?BP 112/77 (BP Location: Right Arm, Patient Position: Sitting, Cuff Size: Large)   Pulse (!) 101   Temp 98.3 ?F (36.8 ?C) (Oral)   Wt 295 lb (133.8 kg)   LMP  (LMP Unknown)   SpO2 97%   BMI 46.20 kg/m?  ?  ? ?Physical Exam ?Vitals reviewed.  ?Constitutional:   ?   General: She is not in acute distress. ?   Appearance: She is well-developed.  ?HENT:  ?   Head: Normocephalic and atraumatic.  ?   Right Ear: Hearing and external ear normal.  ?   Left Ear: Hearing and external ear normal.  ?   Nose: Nose normal.  ?Eyes:  ?   General: Lids are normal. No scleral icterus.    ?   Right eye: No discharge.     ?   Left eye: No discharge.  ?   Conjunctiva/sclera: Conjunctivae normal.  ?Cardiovascular:  ?   Rate and Rhythm: Normal rate and regular rhythm.  ?   Heart sounds: Normal heart sounds.  ?Pulmonary:  ?   Effort: Pulmonary effort is normal. No respiratory distress.  ?Skin: ?   Findings: No lesion or rash.  ?Neurological:  ?   General: No focal deficit present.  ?   Mental Status: She is alert and oriented to person, place, and time.  ?Psychiatric:     ?   Mood and Affect: Mood normal.     ?   Speech: Speech normal.     ?   Behavior: Behavior normal.     ?   Thought Content: Thought content normal.     ?   Judgment: Judgment normal.  ?  ? ? ?No results found for any visits on 08/11/21. ? Assessment & Plan  ?  ? ?1. Hypothyroidism due to Hashimoto's thyroiditis ?Treat for euthyroid TSH. ?- TSH ? ?2. Class 3 severe obesity due  to excess calories without serious comorbidity with body mass index (BMI) of 45.0 to 49.9 in adult [facility]) ?Patient continues to gain some weight as she has been struggling with her issues with metastatic cervical cancer which is doing fairly well presently. ?At this time refer to medical weight management.  I think this is the best long-term option for her. ?The neck step might be bariatric surgery but I think the medical approach is better ? ?3. Anxiety ?Continue duloxetine and as needed Xanax ? ?4. Attention deficit disorder, unspecified hyperactivity presence ? ? ?5. BMI 45.0-49.9, adult ([place]) ? ?- Hemoglobin A1c ?-  Amb Ref to Medical Weight Management ? ?6. Goiter ? ? ?7. Radiation cystitis ? ? ?8. 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[place]) ?Followed by oncology and radiation oncology ? ? ?No follow-ups on file.  ?   ? ?I, Wilhemena Durie, MD, have reviewed all documentation for this visit. The documentation on 08/15/21 for the exam, diagnosis, procedures, and orders are all accurate and complete. ? ? ? ?Kitrina Maurin Cranford Mon, MD  ?[facility] ?[phone] (phone) ?[phone] (fax) ? ?[facility] ?

## 2021-08-12 LAB — TSH: TSH: 10.3 u[IU]/mL — ABNORMAL HIGH (ref 0.450–4.500)

## 2021-08-12 LAB — HEMOGLOBIN A1C
Est. average glucose Bld gHb Est-mCnc: 103 mg/dL
Hgb A1c MFr Bld: 5.2 % (ref 4.8–5.6)

## 2021-08-22 ENCOUNTER — Other Ambulatory Visit: Payer: Self-pay | Admitting: Family Medicine

## 2021-08-22 ENCOUNTER — Other Ambulatory Visit: Payer: Self-pay | Admitting: Oncology

## 2021-08-22 ENCOUNTER — Other Ambulatory Visit: Payer: Self-pay | Admitting: Nurse Practitioner

## 2021-08-22 ENCOUNTER — Encounter: Payer: Self-pay | Admitting: Family Medicine

## 2021-08-22 DIAGNOSIS — F419 Anxiety disorder, unspecified: Secondary | ICD-10-CM

## 2021-08-22 DIAGNOSIS — E038 Other specified hypothyroidism: Secondary | ICD-10-CM

## 2021-08-22 MED ORDER — LEVOTHYROXINE SODIUM 200 MCG PO TABS: ORAL_TABLET | ORAL | 0 refills | Status: DC

## 2021-08-22 NOTE — Telephone Encounter (Signed)
Requested medications are due for refill today.  yes  Requested medications are on the active medications list.  yes  Last refill. 07/16/2021 #90 0 refills  Future visit scheduled.   yes  Notes to clinic.  Medication refill is not delegated.    Requested Prescriptions  Pending Prescriptions Disp Refills   ALPRAZolam (XANAX) 1 MG tablet [Pharmacy Med Name: ALPRAZOLAM 1 MG TAB] 90 tablet     Sig: TAKE 1 TABLET BY MOUTH THREE TIMES DAILY AS NEEDED     Not Delegated - Psychiatry: Anxiolytics/Hypnotics 2 Failed - 08/22/2021 10:27 AM      Failed - This refill cannot be delegated      Failed - Urine Drug Screen completed in last 360 days      Passed - Patient is not pregnant      Passed - Valid encounter within last 6 months    Recent Outpatient Visits           1 week ago Hypothyroidism due to [place] [name], MD   1 year ago Attention deficit disorder, unspecified hyperactivity presence   [facility] [name], MD   1 year ago Pain of right thumb   [facility] [name], MD   1 year ago Frequency of urination   [facility] [name], MD   1 year ago Hand pain, right   [facility] [name], MD       Future Appointments             In 5 months [name], MD [facility] [place]

## 2021-09-12 ENCOUNTER — Other Ambulatory Visit: Payer: Self-pay | Admitting: Oncology

## 2021-09-22 ENCOUNTER — Telehealth: Payer: Self-pay | Admitting: Oncology

## 2021-09-22 ENCOUNTER — Inpatient Hospital Stay: Payer: Medicaid Other | Attending: Oncology | Admitting: Oncology

## 2021-09-22 NOTE — Telephone Encounter (Signed)
Left VM with patient in regards to missed appointment today. Requested she call back to get rescheduled.

## 2021-10-09 ENCOUNTER — Other Ambulatory Visit: Payer: Self-pay

## 2021-10-09 ENCOUNTER — Inpatient Hospital Stay: Payer: BC Managed Care – PPO | Attending: Oncology

## 2021-10-09 DIAGNOSIS — Z452 Encounter for adjustment and management of vascular access device: Secondary | ICD-10-CM | POA: Insufficient documentation

## 2021-10-09 DIAGNOSIS — Z95828 Presence of other vascular implants and grafts: Secondary | ICD-10-CM

## 2021-10-09 DIAGNOSIS — C539 Malignant neoplasm of cervix uteri, unspecified: Secondary | ICD-10-CM | POA: Insufficient documentation

## 2021-10-09 MED ORDER — SODIUM CHLORIDE 0.9% FLUSH
10.0000 mL | Freq: Once | INTRAVENOUS | Status: AC
  Administered 2021-10-09: 10 mL via INTRAVENOUS
  Filled 2021-10-09: qty 10

## 2021-10-09 MED ORDER — HEPARIN SOD (PORK) LOCK FLUSH 100 UNIT/ML IV SOLN
500.0000 [IU] | Freq: Once | INTRAVENOUS | Status: AC
  Administered 2021-10-09: 500 [IU] via INTRAVENOUS
  Filled 2021-10-09: qty 5

## 2021-10-09 MED ORDER — LIDOCAINE-PRILOCAINE 2.5-2.5 % EX CREA: TOPICAL_CREAM | CUTANEOUS | 3 refills | Status: DC

## 2021-10-10 ENCOUNTER — Telehealth: Payer: Self-pay

## 2021-10-10 NOTE — Telephone Encounter (Signed)
PA done for EMLA cream  DPT:ELM7AJHH

## 2021-10-13 ENCOUNTER — Ambulatory Visit: Payer: Self-pay

## 2021-10-13 NOTE — Telephone Encounter (Signed)
Summary: DULoxetine (CYMBALTA) 60 MG  Side effect   Patient is taking DULoxetine (CYMBALTA) 60 MG capsule and she is experiencing insomnia, weight change, mood change and vomiting (patient is not currently vomiting). Patient states she goggled the medication and read that it had a lot of negative side effects. Patient does not just want to d/c the medicatioon     Called pt - LMOM to return call.

## 2021-10-13 NOTE — Telephone Encounter (Signed)
  Chief Complaint: medication advice  Symptoms: insomnia, vomiting, weight changes, mood changes  Frequency: some ongoing for several months Pertinent Negatives: Patient denies vomiting current Disposition: '[]'$ ED /'[]'$ Urgent Care (no appt availability in office) / '[x]'$ Appointment(In office/virtual)/ '[]'$  [place] [facility]/ '[]'$ Home Care/ '[]'$ Refused Recommended Disposition /'[]'$ [place] [facility]/ '[]'$  Follow-up with PCP Additional Notes: pt reviewed symptoms on medication website and doesn't want to continue taking the medication. She knows that she can't abruptly dc the medication so advised her on speaking with Dr. Rosanna Randy regarding this. She said she was placed on this medication 3 years or more so really unsure if she needs a replacement or not.   Summary: DULoxetine (CYMBALTA) 60 MG  Side effect   Patient is taking DULoxetine (CYMBALTA) 60 MG capsule and she is experiencing insomnia, weight change, mood change and vomiting (patient is not currently vomiting). Patient states she goggled the medication and read that it had a lot of negative side effects. Patient does not just want to d/c the medicatioon      Reason for Disposition  [1] Caller has NON-URGENT medicine question about med that PCP prescribed AND [2] triager unable to answer question  Answer Assessment - Initial Assessment Questions 1. NAME of MEDICATION: "What medicine are you calling about?"     duloxetine 2. QUESTION: "What is your question?" (e.g., double dose of medicine, side effect)     Having a lot of side effects  3. PRESCRIBING HCP: "Who prescribed it?" Reason: if prescribed by specialist, call should be referred to that group.     Dr. Rosanna Randy 4. SYMPTOMS: "Do you have any symptoms?"     Insomnia ongoing for months, weight change of 20 lbs, vomiting, mood changes and just feeling numb mental  5. SEVERITY: If symptoms are present, ask "Are they mild, moderate or severe?"     Mild to moderate  Protocols used: Medication  Question Call-A-AH

## 2021-10-14 ENCOUNTER — Ambulatory Visit (INDEPENDENT_AMBULATORY_CARE_PROVIDER_SITE_OTHER): Payer: BC Managed Care – PPO | Admitting: Family Medicine

## 2021-10-14 DIAGNOSIS — F419 Anxiety disorder, unspecified: Secondary | ICD-10-CM | POA: Diagnosis not present

## 2021-10-14 DIAGNOSIS — F988 Other specified behavioral and emotional disorders with onset usually occurring in childhood and adolescence: Secondary | ICD-10-CM

## 2021-10-14 DIAGNOSIS — F3341 Major depressive disorder, recurrent, in partial remission: Secondary | ICD-10-CM | POA: Diagnosis not present

## 2021-10-14 DIAGNOSIS — C539 Malignant neoplasm of cervix uteri, unspecified: Secondary | ICD-10-CM

## 2021-10-14 NOTE — Progress Notes (Signed)
Virtual telephone visit    Virtual Visit via Telephone Note   This visit type was conducted due to national recommendations for restrictions regarding the COVID-19 Pandemic (e.g. social distancing) in an effort to limit this patient's exposure and mitigate transmission in our community. Due to her co-morbid illnesses, this patient is at least at moderate risk for complications without adequate follow up. This format is felt to be most appropriate for this patient at this time. The patient did not have access to video technology or had technical difficulties with video requiring transitioning to audio format only (telephone). Physical exam was limited to content and character of the telephone converstion.    Patient location: home Provider location: office   I discussed the limitations of evaluation and management by telemedicine and the availability of in person appointments. The patient expressed understanding and agreed to proceed.   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  She would like to come off of it but do so safely.    Medications: Outpatient Medications Prior to Visit  Medication Sig   ALPRAZolam (XANAX) 1 MG tablet TAKE 1 TABLET BY MOUTH THREE TIMES DAILY AS NEEDED   amphetamine-dextroamphetamine (ADDERALL) 10 MG tablet Take 1 tablet (10 mg total) by mouth 2 (two) times daily with a meal.   amphetamine-dextroamphetamine (ADDERALL) 10 MG tablet Take 1 tablet (10 mg total) by mouth 2 (two) times daily with a meal.   amphetamine-dextroamphetamine (ADDERALL) 10 MG tablet Take 1 tablet (10 mg total) by mouth 2 (two) times daily with a meal.   DULoxetine (CYMBALTA) 60 MG capsule Take 1 capsule by mouth once daily   estradiol (ESTRACE) 1 MG tablet  Take 1 tablet (1 mg total) by mouth daily.   gabapentin (NEURONTIN) 300 MG capsule TAKE 1 CAPSULE BY MOUTH THREE TIMES DAILY   levothyroxine (SYNTHROID) 175 MCG tablet TAKE 1 TABLET BY MOUTH ONCE DAILY BEFORE BREAKFAST   levothyroxine (SYNTHROID) 200 MCG tablet TAKE 1 TABLET BY MOUTH ONCE DAILY IN THE MORNING BEFORE BREAKFAST   lidocaine-prilocaine (EMLA) cream Apply to affected area once   ondansetron (ZOFRAN) 8 MG tablet Take 8 mg by mouth every 8 (eight) hours as needed for nausea or vomiting.   oxybutynin (DITROPAN XL) 15 MG 24 hr tablet Take 1 tablet (15 mg total) by mouth daily.   progesterone (PROMETRIUM) 200 MG capsule TAKE ONE CAPSULE BY MOUTH AT BEDTIME WITH FOOD FOR 12 DAYS SEQUENTIALLY PER 28 DAY CYCLE   promethazine (PHENERGAN) 25 MG tablet Take 0.5-1 tablets (12.5-25 mg total) by mouth every 12 (twelve) hours as needed for nausea or vomiting.   Facility-Administered Medications Prior to Visit  Medication Dose Route Frequency Provider   sodium chloride flush (NS) 0.9 % injection 10 mL  10 mL Intravenous PRN Sindy Guadeloupe, MD    Review of Systems  Last thyroid functions Lab Results  Component Value Date   TSH 10.300 (H) 08/11/2021       Objective    LMP  (LMP Unknown)  BP Readings from Last 3 Encounters:  08/11/21 112/77  05/28/21 (!) 134/93  04/10/21 (!) 147/85   Wt Readings from Last 3 Encounters:  08/11/21 295 lb (133.8 kg)  05/28/21 288 lb 8 oz (130.9 kg)  04/10/21 284 lb 11.2 oz (129.1 kg)         Assessment & Plan     1. Recurrent major depressive disorder, in partial remission ([place]) Feels she is having side effects from the Cymbalta.  This time go to every other day for 1 week then every third day for 2 weeks and then stop.  I will see her back in late August early September.  2. Anxiety I am concerned that coming off the Cymbalta her depression and anxiety will both worsen a little bit.  3. 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[place]) Followed by oncology and  gynecology  4. Attention deficit disorder (ADD) without hyperactivity    No follow-ups on file.    I discussed the assessment and treatment plan with the patient. The patient was provided an opportunity to ask questions and all were answered. The patient agreed with the plan and demonstrated an understanding of the instructions.   The patient was advised to call back or seek an in-person evaluation if the symptoms worsen or if the condition fails to improve as anticipated.  I provided 10 minutes of non-face-to-face time during this encounter.  I, Wilhemena Durie, MD, have reviewed all documentation for this visit. The documentation on 10/14/21 for the exam, diagnosis, procedures, and orders are all accurate and complete.   Tanji Storrs Cranford Mon, MD [facility] [phone] (phone) [phone] (fax)  [place]

## 2021-10-29 ENCOUNTER — Encounter: Payer: Self-pay | Admitting: Oncology

## 2021-10-29 ENCOUNTER — Emergency Department
Admission: EM | Admit: 2021-10-29 | Discharge: 2021-10-29 | Disposition: A | Discharge status: Home / Self Care | Payer: BC Managed Care – PPO | Attending: Emergency Medicine | Admitting: Emergency Medicine

## 2021-10-29 ENCOUNTER — Emergency Department: Payer: BC Managed Care – PPO

## 2021-10-29 ENCOUNTER — Other Ambulatory Visit: Payer: Self-pay

## 2021-10-29 DIAGNOSIS — R110 Nausea: Secondary | ICD-10-CM | POA: Diagnosis not present

## 2021-10-29 DIAGNOSIS — Z041 Encounter for examination and observation following transport accident: Secondary | ICD-10-CM | POA: Diagnosis not present

## 2021-10-29 DIAGNOSIS — I10 Essential (primary) hypertension: Secondary | ICD-10-CM | POA: Diagnosis not present

## 2021-10-29 DIAGNOSIS — R55 Syncope and collapse: Secondary | ICD-10-CM | POA: Diagnosis not present

## 2021-10-29 DIAGNOSIS — I471 Supraventricular tachycardia: Secondary | ICD-10-CM | POA: Diagnosis not present

## 2021-10-29 DIAGNOSIS — R002 Palpitations: Secondary | ICD-10-CM | POA: Diagnosis not present

## 2021-10-29 LAB — BASIC METABOLIC PANEL
Anion gap: 8 (ref 5–15)
BUN: 10 mg/dL (ref 6–20)
CO2: 23 mmol/L (ref 22–32)
Calcium: 9 mg/dL (ref 8.9–10.3)
Chloride: 108 mmol/L (ref 98–111)
Creatinine, Ser: 0.83 mg/dL (ref 0.44–1.00)
GFR, Estimated: >60 mL/min (ref >60)
Glucose, Bld: 98 mg/dL (ref 70–99)
Potassium: 3.6 mmol/L (ref 3.5–5.1)
Sodium: 139 mmol/L (ref 135–145)

## 2021-10-29 LAB — CBC
HCT: 40.2 % (ref 36.0–46.0)
Hemoglobin: 13.7 g/dL (ref 12.0–15.0)
MCH: 30.6 pg (ref 26.0–34.0)
MCHC: 34.1 g/dL (ref 30.0–36.0)
MCV: 89.7 fL (ref 80.0–100.0)
Platelets: 424 10*3/uL — ABNORMAL HIGH (ref 150–400)
RBC: 4.48 MIL/uL (ref 3.87–5.11)
RDW: 12.5 % (ref 11.5–15.5)
WBC: 12.1 10*3/uL — ABNORMAL HIGH (ref 4.0–10.5)
nRBC: 0 % (ref 0.0–0.2)

## 2021-10-29 LAB — TROPONIN I (HIGH SENSITIVITY)
Troponin I (High Sensitivity): 11 ng/L (ref <18)
Troponin I (High Sensitivity): 17 ng/L (ref <18)

## 2021-10-29 LAB — HCG, QUANTITATIVE, PREGNANCY: hCG, Beta Chain, Quant, S: 3 m[IU]/mL (ref <5)

## 2021-10-29 MED ORDER — SODIUM CHLORIDE 0.9 % IV BOLUS
1000.0000 mL | Freq: Once | INTRAVENOUS | Status: AC
  Administered 2021-10-29: 1000 mL via INTRAVENOUS
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
## 2021-10-29 NOTE — Discharge Instructions (Signed)
Please seek medical attention for any high fevers, chest pain, shortness of breath, change in behavior, persistent vomiting, bloody stool or any other new or concerning symptoms.  

## 2021-10-29 NOTE — ED Notes (Signed)
Patient resting in bed free from sign of distress. Breathing unlabored speaking in full sentences with symmetric chest rise and fall. Bed low and locked with side rails raised x2. Call bell in reach and monitor in place. Family at bedside.

## 2021-10-29 NOTE — ED Notes (Signed)
Patient returned from CT

## 2021-10-29 NOTE — ED Notes (Signed)
Patient transported to CT 

## 2021-10-29 NOTE — ED Notes (Signed)
Patient resting in bed free from sign of distress. Breathing unlabored speaking in full sentences with symmetric chest rise and fall. Bed low and locked with side rails raised x2. Call bell in reach and monitor in place.   
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
## 2021-10-29 NOTE — ED Notes (Signed)
Patient discharged at this time. Ambulated to lobby with independent and steady gait. Breathing unlabored speaking in full sentences. Verbalized understanding of all discharge and follow up teaching. Discharged homed with all belongings.    

## 2021-10-31 ENCOUNTER — Telehealth: Payer: Self-pay

## 2021-10-31 NOTE — Telephone Encounter (Signed)
Transition Care Management Unsuccessful Follow-up Telephone Call  Date of discharge and from where:  10/29/2021 from [facility]  Attempts:  1st Attempt  Reason for unsuccessful TCM follow-up call:  Left voice message

## 2021-11-03 NOTE — Telephone Encounter (Signed)
Transition Care Management Follow-up Telephone Call Date of discharge and from where: 10/29/2021 from [facility] How have you been since you were released from the hospital? Patient states that she is feeling some better. There is some pain still present in between patient shoulder blades but other than that all other sx have subsided.  Any questions or concerns? No  Items Reviewed: Did the pt receive and understand the discharge instructions provided? Yes  Medications obtained and verified? Yes  Other? No  Any new allergies since your discharge? No  Dietary orders reviewed? No Do you have support at home? Yes   Functional Questionnaire: (I = Independent and D = Dependent) ADLs: I Bathing/Dressing- I Meal Prep- I Eating- I Maintaining continence- I Transferring/Ambulation- I Managing Meds- I   Follow up appointments reviewed:  PCP Hospital f/u appt confirmed? No  Encouraged to call PCP for follow up appt.  [facility] f/u appt confirmed? No   Are transportation arrangements needed? No  If their condition worsens, is the pt aware to call PCP or go to the Emergency Dept.? Yes Was the patient provided with contact information for the PCP's office or ED? Yes Was to pt encouraged to call back with questions or concerns? Yes

## 2021-11-10 ENCOUNTER — Other Ambulatory Visit: Payer: Self-pay | Admitting: Family Medicine

## 2021-11-10 ENCOUNTER — Encounter: Payer: Self-pay | Admitting: Nurse Practitioner

## 2021-11-10 ENCOUNTER — Other Ambulatory Visit: Payer: Self-pay

## 2021-11-10 DIAGNOSIS — F988 Other specified behavioral and emotional disorders with onset usually occurring in childhood and adolescence: Secondary | ICD-10-CM

## 2021-11-10 MED ORDER — ESTRADIOL 1 MG PO TABS: 1.0000 mg | ORAL_TABLET | Freq: Every day | ORAL | 5 refills | Status: DC

## 2021-11-11 ENCOUNTER — Other Ambulatory Visit: Payer: Self-pay | Admitting: Family Medicine

## 2021-11-11 DIAGNOSIS — E038 Other specified hypothyroidism: Secondary | ICD-10-CM

## 2021-11-11 NOTE — Telephone Encounter (Signed)
Requested medication (s) are due for refill today: yes  Requested medication (s) are on the active medication list: yes  Last refill:  08/22/21 #90 0 refills  Future visit scheduled: yes in 3 months  Notes to clinic:  protocol failed. Do you want to refill Rx?     Requested Prescriptions  Pending Prescriptions Disp Refills   levothyroxine (SYNTHROID) 200 MCG tablet [Pharmacy Med Name: Levothyroxine Sodium 200 MCG Oral Tablet] 90 tablet 0    Sig: TAKE 1 TABLET BY MOUTH ONCE DAILY IN THE MORNING BEFORE BREAKFAST     Endocrinology:  Hypothyroid Agents Failed - 11/11/2021 10:50 AM      Failed - TSH in normal range and within 360 days    TSH  Date Value Ref Range Status  08/11/2021 10.300 (H) 0.450 - 4.500 uIU/mL Final         Passed - Valid encounter within last 12 months    Recent Outpatient Visits           4 weeks ago Recurrent major depressive disorder, in partial remission [facility])   [facility] [name], MD   3 months ago Hypothyroidism due to [place] [name], MD   1 year ago Attention deficit disorder, unspecified hyperactivity presence   [facility] [name], MD   1 year ago Pain of right thumb   [facility] [name], MD   1 year ago Frequency of urination   [facility] [name], MD       Future Appointments             In 3 months [name], MD [facility] [place]

## 2021-11-12 MED ORDER — AMPHETAMINE-DEXTROAMPHETAMINE 10 MG PO TABS: 10.0000 mg | ORAL_TABLET | Freq: Two times a day (BID) | ORAL | 0 refills | Status: DC

## 2021-12-05 ENCOUNTER — Ambulatory Visit: Payer: Self-pay

## 2021-12-05 ENCOUNTER — Encounter: Payer: Self-pay | Admitting: Family Medicine

## 2021-12-05 ENCOUNTER — Ambulatory Visit (INDEPENDENT_AMBULATORY_CARE_PROVIDER_SITE_OTHER): Payer: BC Managed Care – PPO | Admitting: Family Medicine

## 2021-12-05 VITALS — BP 157/87 | HR 88 | Temp 98.2°F | Resp 16 | Ht 67.0 in | Wt 275.8 lb

## 2021-12-05 DIAGNOSIS — R1032 Left lower quadrant pain: Secondary | ICD-10-CM | POA: Diagnosis not present

## 2021-12-05 DIAGNOSIS — M545 Low back pain, unspecified: Secondary | ICD-10-CM

## 2021-12-05 LAB — POCT URINALYSIS DIPSTICK
Bilirubin, UA: NEGATIVE
Blood, UA: NEGATIVE
Glucose, UA: NEGATIVE
Ketones, UA: NEGATIVE
Leukocytes, UA: NEGATIVE
Nitrite, UA: NEGATIVE
Protein, UA: NEGATIVE
Spec Grav, UA: 1.015 (ref 1.010–1.025)
Urobilinogen, UA: 0.2 E.U./dL
pH, UA: 6 (ref 5.0–8.0)

## 2021-12-05 MED ORDER — CYCLOBENZAPRINE HCL 5 MG PO TABS: 5.0000 mg | ORAL_TABLET | Freq: Three times a day (TID) | ORAL | 1 refills | Status: DC | PRN

## 2021-12-05 MED ORDER — MELOXICAM 7.5 MG PO TABS: 7.5000 mg | ORAL_TABLET | Freq: Every day | ORAL | 0 refills | Status: DC

## 2021-12-05 NOTE — Telephone Encounter (Signed)
  Chief Complaint: back pain Symptoms: Left lower back pain, constant, 6/10 up to an 8/10 Frequency: 2 days Pertinent Negatives: pt states she got her oxybutin 4 days late and unsure if this r/t to pain Disposition: '[]'$ ED /'[]'$ Urgent Care (no appt availability in office) / '[x]'$ Appointment(In office/virtual)/ '[]'$  [facility]/ '[]'$ Home Care/ '[]'$ Refused Recommended Disposition /'[]'$ [facility]/ '[]'$  Follow-up with PCP Additional Notes: scheduled appt for today at 1600 with Dr. Quentin Cornwall.   Reason for Disposition  [1] SEVERE back pain (e.g., excruciating, unable to do any normal activities) AND [2] not improved 2 hours after pain medicine  Answer Assessment - Initial Assessment Questions 1. ONSET: "When did the pain begin?"      2 days  2. LOCATION: "Where does it hurt?" (upper, mid or lower back)     Left lower back  3. SEVERITY: "How bad is the pain?"  (e.g., Scale 1-10; mild, moderate, or severe)   - MILD (1-3): Doesn't interfere with normal activities.    - MODERATE (4-7): Interferes with normal activities or awakens from sleep.    - SEVERE (8-10): Excruciating pain, unable to do any normal activities.      6/10 4. PATTERN: "Is the pain constant?" (e.g., yes, no; constant, intermittent)      Constant  6. CAUSE:  "What do you think is causing the back pain?"      Got oxybutin 4 days  10. OTHER SYMPTOMS: "Do you have any other symptoms?" (e.g., fever, abdomen pain, burning with urination, blood in urine)       Bladder spasms  Protocols used: Back Pain-A-AH

## 2021-12-05 NOTE — Progress Notes (Signed)
Established patient visit  I,Joseline E Rosas,acting as a scribe for [employer], MD.,have documented all relevant documentation on the behalf of Eulis Foster, MD,as directed by  Eulis Foster, MD while in the presence of Eulis Foster, MD.   Patient: Kristy Gordon   DOB: Dec 13, 1985   36 y.o. Female  MRN: [id number] Visit Date: 12/05/2021  Today's healthcare provider: Eulis Foster, MD   Chief Complaint  Patient presents with   Back Pain   Subjective    Patient states that she has had back pain on the left that is traveling more towards her abdomen   She denies vaginal or rectal bleeding  She reports feeling nauseated this AM  She reports that without the oxygutyinin she feels that she has UTI symptoms including bladder spasms and does not feel that she can completely empty her bladder    Back Pain This is a new problem. The current episode started in the past 7 days (2 days ago). The problem occurs constantly. The problem has been gradually worsening since onset. Pain location: lower left pain. The quality of the pain is described as cramping and stabbing. The pain does not radiate. The pain is at a severity of 7/10. The pain is moderate. The pain is Worse during the night. The symptoms are aggravated by lying down. Associated symptoms include abdominal pain (LLQ). Pertinent negatives include no fever, leg pain, numbness or pelvic pain. Risk factors include history of cancer. She has tried nothing for the symptoms.      Medications: Outpatient Medications Prior to Visit  Medication Sig   ALPRAZolam (XANAX) 1 MG tablet TAKE 1 TABLET BY MOUTH THREE TIMES DAILY AS NEEDED   amphetamine-dextroamphetamine (ADDERALL) 10 MG tablet Take 1 tablet (10 mg total) by mouth 2 (two) times daily with a meal.   amphetamine-dextroamphetamine (ADDERALL) 10 MG tablet Take 1 tablet (10 mg total) by mouth 2 (two) times daily with a meal.    amphetamine-dextroamphetamine (ADDERALL) 10 MG tablet Take 1 tablet (10 mg total) by mouth 2 (two) times daily with a meal.   DULoxetine (CYMBALTA) 60 MG capsule Take 1 capsule by mouth once daily   estradiol (ESTRACE) 1 MG tablet Take 1 tablet (1 mg total) by mouth daily.   gabapentin (NEURONTIN) 300 MG capsule TAKE 1 CAPSULE BY MOUTH THREE TIMES DAILY   levothyroxine (SYNTHROID) 200 MCG tablet TAKE 1 TABLET BY MOUTH ONCE DAILY IN THE MORNING BEFORE BREAKFAST   lidocaine-prilocaine (EMLA) cream Apply to affected area once   ondansetron (ZOFRAN) 8 MG tablet Take 8 mg by mouth every 8 (eight) hours as needed for nausea or vomiting.   oxybutynin (DITROPAN XL) 15 MG 24 hr tablet Take 1 tablet (15 mg total) by mouth daily.   progesterone (PROMETRIUM) 200 MG capsule TAKE ONE CAPSULE BY MOUTH AT BEDTIME WITH FOOD FOR 12 DAYS SEQUENTIALLY PER 28 DAY CYCLE   promethazine (PHENERGAN) 25 MG tablet Take 0.5-1 tablets (12.5-25 mg total) by mouth every 12 (twelve) hours as needed for nausea or vomiting.   Facility-Administered Medications Prior to Visit  Medication Dose Route Frequency Provider   sodium chloride flush (NS) 0.9 % injection 10 mL  10 mL Intravenous PRN Sindy Guadeloupe, MD    Review of Systems  Constitutional:  Negative for fever.  Gastrointestinal:  Positive for abdominal pain (LLQ).  Genitourinary:  Negative for pelvic pain.  Musculoskeletal:  Positive for back pain.  Neurological:  Negative for numbness.  Objective    BP (!) 157/87 (BP Location: Left Arm, Patient Position: Sitting, Cuff Size: Large)   Pulse 88   Temp 98.2 F (36.8 C) (Oral)   Resp 16   Ht '5\' 7"'$  (1.702 m)   Wt 275 lb 12.8 oz (125.1 kg)   LMP  (LMP Unknown)   BMI 43.20 kg/m   Physical Exam Constitutional:      General: She is not in acute distress.    Appearance: She is not ill-appearing.  Cardiovascular:     Rate and Rhythm: Normal rate and regular rhythm.  Pulmonary:     Effort: Pulmonary effort is  normal.     Breath sounds: No wheezing or rales.  Abdominal:     General: Bowel sounds are normal.     Palpations: Abdomen is soft.     Tenderness: There is no abdominal tenderness. There is no right CVA tenderness, left CVA tenderness or guarding.  Musculoskeletal:        General: Tenderness present.     Comments: No midline tenderness in thoracic,lumbar nor cervical spine  No swelling or deformities noted  No skin changes, no erythema  Demonstrated normal ROM for lumbar spine   Skin:    General: Skin is warm.  Neurological:     General: No focal deficit present.     Mental Status: She is alert and oriented to person, place, and time.      Results for orders placed or performed in visit on 12/05/21  POCT urinalysis dipstick  Result Value Ref Range   Color, UA light yellow    Clarity, UA Clear    Glucose, UA Negative Negative   Bilirubin, UA Negative    Ketones, UA Negative    Spec Grav, UA 1.015 1.010 - 1.025   Blood, UA Negative    pH, UA 6.0 5.0 - 8.0   Protein, UA Negative Negative   Urobilinogen, UA 0.2 0.2 or 1.0 E.U./dL   Nitrite, UA Negative    Leukocytes, UA Negative Negative   Appearance     Odor      Assessment & Plan     Problem List Items Addressed This Visit       Other   LLQ pain - Primary    U/A completed and negative for UTI  Given exam, suspect this may be MSK related so will treat with mobic during the day and flexeril at bedtime  Patient will follow up with oncology for upcoming A/P CT scan therefore will not order imaging today       Relevant Orders   POCT urinalysis dipstick (Completed)   Urine Microscopic   Left lumbar pain   Relevant Medications   meloxicam (MOBIC) 7.5 MG tablet   cyclobenzaprine (FLEXERIL) 5 MG tablet    The entirety of the information documented in the History of Present Illness, Review of Systems and Physical Exam were personally obtained by me. Portions of this information were initially documented by the CMA and  reviewed by me for thoroughness and accuracy.     Eulis Foster, MD  [facility] [phone] (phone) [phone] (fax)  [place]

## 2021-12-05 NOTE — Assessment & Plan Note (Signed)
U/A completed and negative for UTI  Given exam, suspect this may be MSK related so will treat with mobic during the day and flexeril at bedtime  Patient will follow up with oncology for upcoming A/P CT scan therefore will not order imaging today

## 2021-12-06 LAB — URINALYSIS, MICROSCOPIC ONLY
Bacteria, UA: NONE SEEN
Casts: NONE SEEN /lpf
Epithelial Cells (non renal): NONE SEEN /hpf (ref 0–10)
RBC, Urine: NONE SEEN /hpf (ref 0–2)
WBC, UA: NONE SEEN /hpf (ref 0–5)

## 2021-12-10 ENCOUNTER — Ambulatory Visit: Payer: BC Managed Care – PPO

## 2021-12-12 ENCOUNTER — Other Ambulatory Visit: Payer: Self-pay | Admitting: Family Medicine

## 2021-12-12 ENCOUNTER — Encounter: Payer: Self-pay | Admitting: Family Medicine

## 2021-12-12 MED ORDER — PROMETHAZINE HCL 25 MG PO TABS: 12.5000 mg | ORAL_TABLET | Freq: Two times a day (BID) | ORAL | 2 refills | Status: DC | PRN

## 2021-12-15 ENCOUNTER — Observation Stay: Payer: BC Managed Care – PPO

## 2021-12-15 ENCOUNTER — Encounter: Payer: Self-pay | Admitting: Nurse Practitioner

## 2021-12-15 ENCOUNTER — Inpatient Hospital Stay
Admission: EM | Admit: 2021-12-15 | Discharge: 2021-12-17 | DRG: 392 | Disposition: A | Discharge status: Home / Self Care | Payer: BC Managed Care – PPO | Attending: Obstetrics and Gynecology | Admitting: Obstetrics and Gynecology

## 2021-12-15 ENCOUNTER — Other Ambulatory Visit: Payer: Self-pay

## 2021-12-15 ENCOUNTER — Emergency Department: Payer: BC Managed Care – PPO

## 2021-12-15 ENCOUNTER — Telehealth: Payer: Self-pay | Admitting: *Deleted

## 2021-12-15 DIAGNOSIS — I700 Atherosclerosis of aorta: Secondary | ICD-10-CM | POA: Diagnosis not present

## 2021-12-15 DIAGNOSIS — R112 Nausea with vomiting, unspecified: Principal | ICD-10-CM | POA: Diagnosis present

## 2021-12-15 DIAGNOSIS — R519 Headache, unspecified: Secondary | ICD-10-CM | POA: Diagnosis not present

## 2021-12-15 DIAGNOSIS — F419 Anxiety disorder, unspecified: Secondary | ICD-10-CM | POA: Diagnosis not present

## 2021-12-15 DIAGNOSIS — F909 Attention-deficit hyperactivity disorder, unspecified type: Secondary | ICD-10-CM | POA: Diagnosis not present

## 2021-12-15 DIAGNOSIS — H9192 Unspecified hearing loss, left ear: Secondary | ICD-10-CM | POA: Diagnosis not present

## 2021-12-15 DIAGNOSIS — Z880 Allergy status to penicillin: Secondary | ICD-10-CM

## 2021-12-15 DIAGNOSIS — R1084 Generalized abdominal pain: Secondary | ICD-10-CM | POA: Diagnosis not present

## 2021-12-15 DIAGNOSIS — E063 Autoimmune thyroiditis: Secondary | ICD-10-CM | POA: Diagnosis present

## 2021-12-15 DIAGNOSIS — R197 Diarrhea, unspecified: Secondary | ICD-10-CM | POA: Diagnosis not present

## 2021-12-15 DIAGNOSIS — F17210 Nicotine dependence, cigarettes, uncomplicated: Secondary | ICD-10-CM | POA: Diagnosis not present

## 2021-12-15 DIAGNOSIS — D72829 Elevated white blood cell count, unspecified: Secondary | ICD-10-CM | POA: Diagnosis present

## 2021-12-15 DIAGNOSIS — Z79890 Hormone replacement therapy (postmenopausal): Secondary | ICD-10-CM | POA: Diagnosis not present

## 2021-12-15 DIAGNOSIS — C539 Malignant neoplasm of cervix uteri, unspecified: Secondary | ICD-10-CM | POA: Diagnosis present

## 2021-12-15 DIAGNOSIS — G629 Polyneuropathy, unspecified: Secondary | ICD-10-CM | POA: Diagnosis present

## 2021-12-15 DIAGNOSIS — F32A Depression, unspecified: Secondary | ICD-10-CM | POA: Diagnosis not present

## 2021-12-15 DIAGNOSIS — Z20822 Contact with and (suspected) exposure to covid-19: Secondary | ICD-10-CM | POA: Diagnosis present

## 2021-12-15 DIAGNOSIS — Z79899 Other long term (current) drug therapy: Secondary | ICD-10-CM | POA: Diagnosis not present

## 2021-12-15 DIAGNOSIS — R1032 Left lower quadrant pain: Secondary | ICD-10-CM | POA: Diagnosis not present

## 2021-12-15 DIAGNOSIS — E039 Hypothyroidism, unspecified: Secondary | ICD-10-CM | POA: Diagnosis present

## 2021-12-15 DIAGNOSIS — Z8541 Personal history of malignant neoplasm of cervix uteri: Secondary | ICD-10-CM | POA: Diagnosis not present

## 2021-12-15 DIAGNOSIS — E876 Hypokalemia: Secondary | ICD-10-CM | POA: Diagnosis not present

## 2021-12-15 DIAGNOSIS — E038 Other specified hypothyroidism: Secondary | ICD-10-CM

## 2021-12-15 DIAGNOSIS — K449 Diaphragmatic hernia without obstruction or gangrene: Secondary | ICD-10-CM | POA: Diagnosis not present

## 2021-12-15 DIAGNOSIS — R1110 Vomiting, unspecified: Secondary | ICD-10-CM | POA: Diagnosis not present

## 2021-12-15 DIAGNOSIS — Z720 Tobacco use: Secondary | ICD-10-CM | POA: Diagnosis present

## 2021-12-15 LAB — COMPREHENSIVE METABOLIC PANEL
ALT: 26 U/L (ref 0–44)
AST: 21 U/L (ref 15–41)
Albumin: 4.2 g/dL (ref 3.5–5.0)
Alkaline Phosphatase: 64 U/L (ref 38–126)
Anion gap: 14 (ref 5–15)
BUN: 12 mg/dL (ref 6–20)
CO2: 20 mmol/L — ABNORMAL LOW (ref 22–32)
Calcium: 9.1 mg/dL (ref 8.9–10.3)
Chloride: 104 mmol/L (ref 98–111)
Creatinine, Ser: 0.73 mg/dL (ref 0.44–1.00)
GFR, Estimated: >60 mL/min (ref >60)
Glucose, Bld: 119 mg/dL — ABNORMAL HIGH (ref 70–99)
Potassium: 2.8 mmol/L — ABNORMAL LOW (ref 3.5–5.1)
Sodium: 138 mmol/L (ref 135–145)
Total Bilirubin: 1 mg/dL (ref 0.3–1.2)
Total Protein: 7.9 g/dL (ref 6.5–8.1)

## 2021-12-15 LAB — URINE DRUG SCREEN, QUALITATIVE (ARMC ONLY)
Amphetamines, Ur Screen: NOT DETECTED
Barbiturates, Ur Screen: NOT DETECTED
Benzodiazepine, Ur Scrn: POSITIVE — AB
Cannabinoid 50 Ng, Ur ~~LOC~~: POSITIVE — AB
Cocaine Metabolite,Ur ~~LOC~~: NOT DETECTED
MDMA (Ecstasy)Ur Screen: NOT DETECTED
Methadone Scn, Ur: NOT DETECTED
Opiate, Ur Screen: POSITIVE — AB
Phencyclidine (PCP) Ur S: NOT DETECTED
Tricyclic, Ur Screen: NOT DETECTED

## 2021-12-15 LAB — URINALYSIS, ROUTINE W REFLEX MICROSCOPIC
Bilirubin Urine: NEGATIVE
Glucose, UA: NEGATIVE mg/dL
Ketones, ur: 5 mg/dL — AB
Leukocytes,Ua: NEGATIVE
Nitrite: NEGATIVE
Protein, ur: 30 mg/dL — AB
Specific Gravity, Urine: 1.017 (ref 1.005–1.030)
pH: 8 (ref 5.0–8.0)

## 2021-12-15 LAB — CBC
HCT: 43.4 % (ref 36.0–46.0)
Hemoglobin: 15.4 g/dL — ABNORMAL HIGH (ref 12.0–15.0)
MCH: 31.1 pg (ref 26.0–34.0)
MCHC: 35.5 g/dL (ref 30.0–36.0)
MCV: 87.7 fL (ref 80.0–100.0)
Platelets: 490 10*3/uL — ABNORMAL HIGH (ref 150–400)
RBC: 4.95 MIL/uL (ref 3.87–5.11)
RDW: 12.1 % (ref 11.5–15.5)
WBC: 16.3 10*3/uL — ABNORMAL HIGH (ref 4.0–10.5)
nRBC: 0 % (ref 0.0–0.2)

## 2021-12-15 LAB — SARS CORONAVIRUS 2 BY RT PCR: SARS Coronavirus 2 by RT PCR: NEGATIVE

## 2021-12-15 LAB — LIPASE, BLOOD: Lipase: 64 U/L — ABNORMAL HIGH (ref 11–51)

## 2021-12-15 LAB — MAGNESIUM: Magnesium: 2 mg/dL (ref 1.7–2.4)

## 2021-12-15 LAB — POTASSIUM: Potassium: 3.2 mmol/L — ABNORMAL LOW (ref 3.5–5.1)

## 2021-12-15 LAB — TSH: TSH: 0.277 u[IU]/mL — ABNORMAL LOW (ref 0.350–4.500)

## 2021-12-15 LAB — POC URINE PREG, ED: Preg Test, Ur: NEGATIVE

## 2021-12-15 LAB — T4, FREE: Free T4: 1.3 ng/dL — ABNORMAL HIGH (ref 0.61–1.12)

## 2021-12-15 MED ORDER — SODIUM CHLORIDE 0.9 % IV SOLN: INTRAVENOUS | Status: DC

## 2021-12-15 MED ORDER — ONDANSETRON HCL 4 MG/2ML IJ SOLN
4.0000 mg | Freq: Four times a day (QID) | INTRAMUSCULAR | Status: DC | PRN
  Administered 2021-12-16: 4 mg via INTRAVENOUS
  Filled 2021-12-15: qty 2

## 2021-12-15 MED ORDER — SODIUM CHLORIDE 0.9 % IV BOLUS
1000.0000 mL | Freq: Once | INTRAVENOUS | Status: AC
  Administered 2021-12-15: 1000 mL via INTRAVENOUS

## 2021-12-15 MED ORDER — ONDANSETRON HCL 4 MG/2ML IJ SOLN
4.0000 mg | Freq: Once | INTRAMUSCULAR | Status: AC
  Administered 2021-12-15: 4 mg via INTRAVENOUS
  Filled 2021-12-15: qty 2

## 2021-12-15 MED ORDER — NICOTINE 21 MG/24HR TD PT24: 21.0000 mg | MEDICATED_PATCH | Freq: Every day | TRANSDERMAL | Status: DC | PRN

## 2021-12-15 MED ORDER — MORPHINE SULFATE (PF) 4 MG/ML IV SOLN
4.0000 mg | Freq: Once | INTRAVENOUS | Status: AC
  Administered 2021-12-15: 4 mg via INTRAVENOUS
  Filled 2021-12-15: qty 1

## 2021-12-15 MED ORDER — HYDRALAZINE HCL 20 MG/ML IJ SOLN: 10.0000 mg | Freq: Four times a day (QID) | INTRAMUSCULAR | Status: DC | PRN

## 2021-12-15 MED ORDER — ALPRAZOLAM 1 MG PO TABS
1.0000 mg | ORAL_TABLET | Freq: Three times a day (TID) | ORAL | Status: DC | PRN
  Administered 2021-12-15 – 2021-12-16 (×2): 1 mg via ORAL
  Filled 2021-12-15 (×2): qty 1

## 2021-12-15 MED ORDER — LORAZEPAM 2 MG/ML IJ SOLN: 2.0000 mg | Freq: Four times a day (QID) | INTRAMUSCULAR | Status: DC | PRN

## 2021-12-15 MED ORDER — OXYBUTYNIN CHLORIDE ER 5 MG PO TB24
15.0000 mg | ORAL_TABLET | Freq: Every day | ORAL | Status: DC
  Administered 2021-12-16 – 2021-12-17 (×3): 15 mg via ORAL
  Filled 2021-12-15 (×4): qty 1

## 2021-12-15 MED ORDER — LEVOTHYROXINE SODIUM 100 MCG PO TABS
200.0000 ug | ORAL_TABLET | Freq: Every day | ORAL | Status: DC
  Administered 2021-12-16 – 2021-12-17 (×2): 200 ug via ORAL
  Filled 2021-12-15 (×2): qty 2

## 2021-12-15 MED ORDER — ONDANSETRON HCL 4 MG PO TABS: 4.0000 mg | ORAL_TABLET | Freq: Four times a day (QID) | ORAL | Status: DC | PRN

## 2021-12-15 MED ORDER — HALOPERIDOL LACTATE 5 MG/ML IJ SOLN
5.0000 mg | Freq: Four times a day (QID) | INTRAMUSCULAR | Status: DC | PRN
  Administered 2021-12-15 – 2021-12-16 (×2): 5 mg via INTRAVENOUS
  Filled 2021-12-15 (×2): qty 1

## 2021-12-15 MED ORDER — KETOROLAC TROMETHAMINE 15 MG/ML IJ SOLN
15.0000 mg | Freq: Once | INTRAMUSCULAR | Status: AC
  Administered 2021-12-15: 15 mg via INTRAVENOUS
  Filled 2021-12-15: qty 1

## 2021-12-15 MED ORDER — POTASSIUM CHLORIDE 10 MEQ/100ML IV SOLN
10.0000 meq | INTRAVENOUS | Status: AC
  Administered 2021-12-15 – 2021-12-16 (×2): 10 meq via INTRAVENOUS
  Filled 2021-12-15: qty 100

## 2021-12-15 MED ORDER — CYCLOBENZAPRINE HCL 10 MG PO TABS: 5.0000 mg | ORAL_TABLET | Freq: Three times a day (TID) | ORAL | Status: DC | PRN

## 2021-12-15 MED ORDER — POTASSIUM CHLORIDE CRYS ER 20 MEQ PO TBCR
20.0000 meq | EXTENDED_RELEASE_TABLET | Freq: Once | ORAL | Status: AC
  Administered 2021-12-15: 20 meq via ORAL
  Filled 2021-12-15: qty 1

## 2021-12-15 MED ORDER — SENNOSIDES-DOCUSATE SODIUM 8.6-50 MG PO TABS: 1.0000 | ORAL_TABLET | Freq: Every evening | ORAL | Status: DC | PRN

## 2021-12-15 MED ORDER — ACETAMINOPHEN 650 MG RE SUPP: 650.0000 mg | Freq: Four times a day (QID) | RECTAL | Status: DC | PRN

## 2021-12-15 MED ORDER — SODIUM CHLORIDE 0.9 % IV SOLN
25.0000 mg | Freq: Four times a day (QID) | INTRAVENOUS | Status: DC | PRN
  Administered 2021-12-15: 25 mg via INTRAVENOUS
  Filled 2021-12-15: qty 1

## 2021-12-15 MED ORDER — LABETALOL HCL 5 MG/ML IV SOLN
5.0000 mg | Freq: Once | INTRAVENOUS | Status: AC
  Administered 2021-12-15: 5 mg via INTRAVENOUS
  Filled 2021-12-15: qty 4

## 2021-12-15 MED ORDER — ENOXAPARIN SODIUM 60 MG/0.6ML IJ SOSY
0.5000 mg/kg | PREFILLED_SYRINGE | Freq: Every day | INTRAMUSCULAR | Status: DC
  Administered 2021-12-16 (×2): 62.5 mg via SUBCUTANEOUS
  Filled 2021-12-15 (×2): qty 1.2

## 2021-12-15 MED ORDER — HALOPERIDOL LACTATE 5 MG/ML IJ SOLN: 2.0000 mg | Freq: Four times a day (QID) | INTRAMUSCULAR | Status: DC | PRN

## 2021-12-15 MED ORDER — SODIUM CHLORIDE 0.9 % IV SOLN
25.0000 mg | Freq: Three times a day (TID) | INTRAVENOUS | Status: DC | PRN
  Administered 2021-12-16: 25 mg via INTRAVENOUS
  Filled 2021-12-15: qty 1

## 2021-12-15 MED ORDER — DULOXETINE HCL 30 MG PO CPEP
60.0000 mg | ORAL_CAPSULE | Freq: Every day | ORAL | Status: DC
  Administered 2021-12-16 – 2021-12-17 (×3): 60 mg via ORAL
  Filled 2021-12-15 (×3): qty 2

## 2021-12-15 MED ORDER — MORPHINE SULFATE (PF) 4 MG/ML IV SOLN
4.0000 mg | INTRAVENOUS | Status: DC | PRN
  Administered 2021-12-16: 4 mg via INTRAVENOUS
  Filled 2021-12-15: qty 1

## 2021-12-15 MED ORDER — ACETAMINOPHEN 500 MG PO TABS: 1000.0000 mg | ORAL_TABLET | Freq: Four times a day (QID) | ORAL | Status: DC | PRN

## 2021-12-15 MED ORDER — IOHEXOL 300 MG/ML  SOLN
100.0000 mL | Freq: Once | INTRAMUSCULAR | Status: AC | PRN
  Administered 2021-12-15: 100 mL via INTRAVENOUS

## 2021-12-15 MED ORDER — POTASSIUM CHLORIDE 10 MEQ/100ML IV SOLN
10.0000 meq | INTRAVENOUS | Status: DC
  Administered 2021-12-15 (×2): 10 meq via INTRAVENOUS
  Filled 2021-12-15 (×2): qty 100

## 2021-12-15 NOTE — Telephone Encounter (Signed)
Per Marquis Lunch, PA "For her concerns I would recommend the emergency room as she will need extensive labs and work up imaging " Patient informed of this and agreed  stating, "OK"
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
## 2021-12-15 NOTE — Assessment & Plan Note (Signed)
-   Ativan 2 mg IV every 6 hours as needed for anxiety not relieved by home dosing - Duloxetine 60 mg daily resumed - Home alprazolam 1 mg p.o. 3 times daily as needed for anxiety resumed

## 2021-12-15 NOTE — ED Triage Notes (Signed)
Pt arrives with c/o n/v/d for the last next. Pt endorses LLQ pain. Pt has hx of cervical cancer.

## 2021-12-15 NOTE — ED Provider Notes (Signed)
[facility] Provider Note    Event Date/Time   First MD Initiated Contact with Patient 12/15/21 1414     (approximate)   History   Chief Complaint: Emesis   HPI  Kristy Gordon is a 36 y.o. female with a history of hypothyroidism, Hashimoto's thyroiditis, cervicals cancer with regional metastasis who comes ED complaining of nausea vomiting diarrhea for the past week along with lower abdominal pain.  No fever.  No dysuria.  No chest pain or shortness of breath.     Physical Exam   Triage Vital Signs: ED Triage Vitals  Enc Vitals Group     BP 12/15/21 1308 (!) 181/110     Pulse Rate 12/15/21 1308 (!) 120     Resp 12/15/21 1308 (!) 23     Temp 12/15/21 1308 98.2 F (36.8 C)     Temp src --      SpO2 12/15/21 1308 97 %     Weight 12/15/21 1309 275 lb (124.7 kg)     Height --      Head Circumference --      Peak Flow --      Pain Score 12/15/21 1309 10     Pain Loc --      Pain Edu? --      Excl. in [place]? --     Most recent vital signs: Vitals:   12/15/21 1430 12/15/21 1530  BP: (!) 232/129 (!) 158/108  Pulse: 81 80  Resp:  (!) 22  Temp:    SpO2: 99% 100%    General: Awake, no distress.  CV:  Good peripheral perfusion.  Tachycardia, heart rate 120 Resp:  Normal effort.  Clear to auscultation Abd:  No distention.  Soft with diffuse lower abdominal tenderness Other:  No thyromegaly.  Dry mucous membranes   ED Results / Procedures / Treatments   Labs (all labs ordered are listed, but only abnormal results are displayed) Labs Reviewed  LIPASE, BLOOD - Abnormal; Notable for the following components:      Result Value   Lipase 64 (*)    All other components within normal limits  COMPREHENSIVE METABOLIC PANEL - Abnormal; Notable for the following components:   Potassium 2.8 (*)    CO2 20 (*)    Glucose, Bld 119 (*)    All other components within normal limits  CBC - Abnormal; Notable for the following components:   WBC 16.3 (*)     Hemoglobin 15.4 (*)    Platelets 490 (*)    All other components within normal limits  URINALYSIS, ROUTINE W REFLEX MICROSCOPIC - Abnormal; Notable for the following components:   Color, Urine YELLOW (*)    APPearance HAZY (*)    Hgb urine dipstick SMALL (*)    Ketones, ur 5 (*)    Protein, ur 30 (*)    Bacteria, UA RARE (*)    All other components within normal limits  TSH - Abnormal; Notable for the following components:   TSH 0.277 (*)    All other components within normal limits  T4, FREE - Abnormal; Notable for the following components:   Free T4 1.30 (*)    All other components within normal limits  MAGNESIUM  POC URINE PREG, ED     EKG    RADIOLOGY CT chest abdomen pelvis pending   PROCEDURES:  Procedures   MEDICATIONS ORDERED IN ED: Medications  promethazine (PHENERGAN) 25 mg in sodium chloride 0.9 % 50 mL IVPB (  has no administration in time range)  iohexol (OMNIPAQUE) 300 MG/ML solution 100 mL (has no administration in time range)  sodium chloride 0.9 % bolus 1,000 mL (1,000 mLs Intravenous New Bag/Given 12/15/21 1436)  ketorolac (TORADOL) 15 MG/ML injection 15 mg (15 mg Intravenous Given 12/15/21 1432)  ondansetron (ZOFRAN) injection 4 mg (4 mg Intravenous Given 12/15/21 1432)  morphine (PF) 4 MG/ML injection 4 mg (4 mg Intravenous Given 12/15/21 1432)     IMPRESSION / MDM / ASSESSMENT AND PLAN / ED COURSE  I reviewed the triage vital signs and the nursing notes.                              Differential diagnosis includes, but is not limited to, anemia, pancreatitis, ovarian cyst, diverticulitis, intra-abdominal abscess, viral syndrome, dehydration, electrolyte abnormality, hypothyroidism, pregnancy, UTI, appendicitis  Patient's presentation is most consistent with acute presentation with potential threat to life or bodily function.  Patient presents with lower abdominal pain, vomiting diarrhea, tachycardia hypertension.  She is not septic.  We will give  IV fluids for hydration along with Toradol morphine Zofran for symptom relief.  Will obtain CT chest abdomen pelvis to evaluate for abdominal pathology or metastatic disease.  Care signed out to Dr. Rip Harbour at 3:00 PM       FINAL CLINICAL IMPRESSION(S) / ED DIAGNOSES   Final diagnoses:  Generalized abdominal pain  Nausea and vomiting, unspecified vomiting type     Rx / DC Orders   ED Discharge Orders     None        Note:  This document was prepared using Dragon voice recognition software and may include unintentional dictation errors.   Carrie Mew, MD 12/15/21 1556

## 2021-12-15 NOTE — Progress Notes (Signed)
Anticoagulation monitoring(Lovenox):  36 yo  female ordered Lovenox 40 mg Q24h    Filed Weights   12/15/21 1309  Weight: 124.7 kg (275 lb)   BMI 43   Lab Results  Component Value Date   CREATININE 0.73 12/15/2021   CREATININE 0.83 10/29/2021   CREATININE 0.65 04/10/2021   Estimated Creatinine Clearance: 134.5 mL/min (by C-G formula based on SCr of 0.73 mg/dL). Hemoglobin & Hematocrit     Component Value Date/Time   HGB 15.4 (H) 12/15/2021 1348   HGB 14.7 09/11/2015 0954   HCT 43.4 12/15/2021 1348   HCT 42.8 09/11/2015 0954     Per Protocol for Patient with estCrcl > 30 ml/min and BMI > 30, will transition to Lovenox 62.5 mg Q24h.

## 2021-12-15 NOTE — Hospital Course (Signed)
[name] is a 36 year old female with history of ADHD, hypothyroid, Hashimoto's thyroiditis, cervical cancer, neuropathy, depression, anxiety, who presents emergency department for chief concerns of intractable nausea and vomiting.  Initial vitals in the emergency department showed temperature of 98.2, respiration rate of 23, heart rate of 85, initial blood pressure 208/113, SPO2 of 96% on room air.  Serum sodium is 138, potassium 2.8, chloride 104, bicarb 20, BUN of 12, serum creatinine of 0.73, GFR greater than 60, nonfasting blood glucose 119, WBC 16.3, hemoglobin 15.4, Serum magnesium level on ad platelets of 490.    Magnesium is 2.0.  ED treatment: Toradol 15 mg IV, labetalol 5 mg IV, morphine 4 mg x 1, ondansetron 4 mg IV x2, Phenergan 25 mg IV x2, sodium chloride 1 L bolus

## 2021-12-15 NOTE — Assessment & Plan Note (Signed)
-   Levothyroxine 200 mcg daily before breakfast resume

## 2021-12-15 NOTE — Assessment & Plan Note (Signed)
-   Duloxetine 60 mg daily resumed 

## 2021-12-15 NOTE — Assessment & Plan Note (Addendum)
-   Differential diagnosis include gastroparesis, cyclic vomiting syndrome, cannabis hyperemesis syndrome, gastroenteritis. - Advised patient for cessation of THC. She uses edibles, last used 1 week ago. - Haldol 5 mg IV every 6 hours as needed for intractable nausea and vomiting not responsive to ondansetron and/or Phenergan

## 2021-12-15 NOTE — TOC Initial Note (Signed)
Transition of Care [facility]) - Initial/Assessment Note    Patient Details  Name: Kristy Gordon MRN: [id number] Date of Birth: Aug 01, 1985  Transition of Care [facility]) CM/SW Contact:    Laurena Slimmer, RN Phone Number: 12/15/2021, 11:12 PM  Clinical Narrative:                  Transition of Care [facility]) Screening Note   Patient Details  Name: Kristy Gordon Date of Birth: October 21, 1985   Transition of Care [facility]) CM/SW Contact:    Laurena Slimmer, RN Phone Number: 12/15/2021, 11:12 PM    Transition of Care Department [facility]) has reviewed patient and no TOC needs have been identified at this time. We will continue to monitor patient advancement through interdisciplinary progression rounds. If new patient transition needs arise, please place a TOC consult.          Patient Goals and CMS Choice        Expected Discharge Plan and Services                                                Prior Living Arrangements/Services                       Activities of Daily Living Home Assistive Devices/Equipment: None ADL Screening (condition at time of admission) Patient's cognitive ability adequate to safely complete daily activities?: No Is the patient deaf or have difficulty hearing?: No Does the patient have difficulty seeing, even when wearing glasses/contacts?: No Does the patient have difficulty concentrating, remembering, or making decisions?: No Patient able to express need for assistance with ADLs?: No Does the patient have difficulty dressing or bathing?: No Independently performs ADLs?: Yes (appropriate for developmental age) Does the patient have difficulty walking or climbing stairs?: No Weakness of Legs: Both Weakness of Arms/Hands: None  Permission Sought/Granted                  Emotional Assessment              Admission diagnosis:  Generalized abdominal pain [R10.84] Intractable nausea and vomiting [R11.2] Nausea and vomiting,  unspecified vomiting type [R11.2] Patient Active Problem List   Diagnosis Date Noted   Intractable nausea and vomiting 12/15/2021   Hypokalemia 12/15/2021   Leukocytosis 12/15/2021   LLQ pain 12/05/2021   Left lumbar pain 12/05/2021   Obesity, Class III, BMI 40-49.9 (morbid obesity) ([place]) 05/21/2020   Tenesmus (rectal) 04/24/2020   Pelvic floor dysfunction 04/10/2019   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   Bladder spasms 02/27/2019   Microscopic hematuria 02/27/2019   Vaginal pain 11/18/2018   Premature menopause on hormone replacement therapy 10/20/2018   Adjustment disorder with mixed anxiety and depressed mood 10/20/2018   Goiter 05/20/2018   Hypothyroidism 05/20/2018   Tobacco use 05/20/2018   Cervical cancer ([place]) 05/11/2018   Neuropathy 05/07/2018   Nausea without vomiting 04/25/2018   Iron deficiency anemia 04/11/2018   Goals of care, counseling/discussion 03/28/2018   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 [place] [address] Phone: [phone] Fax: [phone]  [place], [place] - [address] [place] [address] Phone: [phone] Fax: [phone]  [id number] - [place], [place] - [place] [address] [place] [address] [place] [address] Phone: [phone] Fax: [phone]     Social Determinants of Health (SDOH)  Interventions    Readmission Risk Interventions     No data to display

## 2021-12-15 NOTE — Assessment & Plan Note (Signed)
K was 2.8 on admission, started on replacement.  K 3.2 this AM, further IV and PO replacement ordered. --BMP in AM --Replace K PRN

## 2021-12-15 NOTE — Assessment & Plan Note (Addendum)
-   PRN nicotine patch ordered 

## 2021-12-15 NOTE — Assessment & Plan Note (Signed)
WBC was 16.3 on admission, improved to 9.1 this AM.   - UA was negative for leukocytes and nitrates - Procalcitonin < 0.10 --GI panel, C. difficile both negative --Monitor CBC

## 2021-12-15 NOTE — H&P (Signed)
History and Physical   Kristy Gordon YHC:[id number] DOB: 10/24/1985 DOA: 12/15/2021  PCP: [name], MD  Patient coming from: home  I have personally briefly reviewed patient's old medical records in [place].  Chief Concern: nausea and vomiting  HPI: [name] is a 36 year old female with history of ADHD, hypothyroid, Hashimoto's thyroiditis, cervical cancer, neuropathy, depression, anxiety, who presents emergency department for chief concerns of intractable nausea and vomiting.  Initial vitals in the emergency department showed temperature of 98.2, respiration rate of 23, heart rate of 85, initial blood pressure 208/113, SPO2 of 96% on room air.  Serum sodium is 138, potassium 2.8, chloride 104, bicarb 20, BUN of 12, serum creatinine of 0.73, GFR greater than 60, nonfasting blood glucose 119, WBC 16.3, hemoglobin 15.4, Serum magnesium level on ad platelets of 490.    Magnesium is 2.0.  ED treatment: Toradol 15 mg IV, labetalol 5 mg IV, morphine 4 mg x 1, ondansetron 4 mg IV x2, Phenergan 25 mg IV x2, sodium chloride 1 L bolus  At bedside, she is able to tell me her name, age, current location.   Social history: She lives with her husband. She denies etoh use. She endroses THC use. Last used one week ago.    ROS:*** Constitutional: no weight change, no fever ENT/Mouth: no sore throat, no rhinorrhea Eyes: no eye pain, no vision changes Cardiovascular: no chest pain, no dyspnea,  no edema, no palpitations Respiratory: no cough, no sputum, no wheezing Gastrointestinal: no nausea, no vomiting, no diarrhea, no constipation Genitourinary: no urinary incontinence, no dysuria, no hematuria Musculoskeletal: no arthralgias, no myalgias Skin: no skin lesions, no pruritus, Neuro: + weakness, no loss of consciousness, no syncope Psych: no anxiety, no depression, + decrease appetite Heme/Lymph: no bruising, no bleeding  ED Course: Discussed with emergency  medicine provider, patient requiring hospitalization for chief concerns of intractable nausea and vomiting and hypokalemia.  Assessment/Plan  Principal Problem:   Intractable nausea and vomiting Active Problems:   Anxiety   Clinical depression   Cervical cancer (HCC)   Hypothyroidism   Tobacco use   Hypokalemia   Leukocytosis   Assessment and Plan:  * Intractable nausea and vomiting - Differential diagnosis include gastroparesis, cyclic vomiting syndrome, cannabis hyperemesis syndrome - Advised patient to avoid chocolate, cheese, MSG, THC - Haldol 5 mg IV every 6 hours as needed for intractable nausea and vomiting not responsive to ondansetron and/or Phenergan  Leukocytosis - Etiology work-up in progress - UA was negative for leukocytes and nitrates - Check procalcitonin, GI panel, C. difficile colitis ordered pending collection  Hypokalemia - Presumed secondary to intractable nausea and vomiting - Potassium chloride supplementation 10 mill equivalent every hour, 6 doses ordered - Recheck potassium chloride at 10:45 PM on day of admission - Repeat BMP in the a.m.  Tobacco use - PRN nicotine patch ordered  Hypothyroidism - Levothyroxine 200 mcg daily before breakfast resume  Clinical depression - Duloxetine 60 mg daily resumed  Anxiety - Ativan 2 mg IV every 6 hours as needed for anxiety not relieved by home dosing - Duloxetine 60 mg daily resumed - Home alprazolam 1 mg p.o. 3 times daily as needed for anxiety resumed      *** Chart reviewed.   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 She has a 1.50 pack-year smoking history. She has never used smokeless tobacco. She reports current alcohol use. She reports current drug use. Drug: Marijuana.  Allergies  Allergen Reactions   Amoxicillin Rash    Did it involve swelling of the face/tongue/throat, SOB, or low BP? No Did it involve sudden or severe rash/hives, skin peeling, or any reaction on the inside of your mouth or nose? No Did you need to seek medical attention at a hospital or doctor's office? No When did it last happen?  Childhood     If all above answers are "NO", may proceed with cephalosporin use.    Penicillins Hives    Did it involve swelling of the face/tongue/throat, SOB, or low BP? No Did it involve sudden or severe rash/hives, skin peeling, or any reaction on the inside of your mouth or nose? No Did you need to seek medical attention at a hospital or doctor's office? No When did it last happen?  Childhood     If all above answers are "NO", may proceed with cephalosporin use.    Family History  Adopted: Yes  Family history unknown: Yes   Family history: Family history reviewed and not pertinent***  Prior to Admission medications   Medication Sig Start Date End Date Taking? Authorizing Provider  ALPRAZolam [name]) 1 MG tablet TAKE 1 TABLET BY MOUTH THREE TIMES DAILY AS NEEDED 08/22/21  Yes [name], MD  amphetamine-dextroamphetamine (ADDERALL) 10 MG tablet Take 1  tablet (10 mg total) by mouth 2 (two) times daily with a meal. 11/12/21  Yes [name], MD  cyclobenzaprine (FLEXERIL) 5 MG tablet Take 1 tablet (5 mg total) by mouth 3 (three) times daily as needed for muscle spasms. 12/05/21  Yes Simmons-Robinson, Makiera, MD  DULoxetine (CYMBALTA) 60 MG capsule Take 1 capsule by mouth once daily 08/22/21  Yes [name], MD  estradiol (ESTRACE) 1 MG tablet Take 1 tablet (1 mg total) by mouth daily. 11/10/21  Yes Verlon Au, NP  gabapentin (NEURONTIN) 300 MG capsule TAKE 1 CAPSULE BY MOUTH THREE TIMES DAILY 09/12/21  Yes Covington, Sarah M, PA-C  levothyroxine (SYNTHROID) 200 MCG tablet TAKE 1 TABLET BY MOUTH ONCE DAILY IN THE MORNING BEFORE BREAKFAST Patient taking differently: Take 200 mcg by mouth daily before breakfast. TAKE 1 TABLET BY MOUTH ONCE DAILY IN THE MORNING BEFORE BREAKFAST 11/12/21  Yes [name], MD  meloxicam (MOBIC) 7.5 MG tablet Take 1 tablet (7.5 mg total) by mouth daily. 12/05/21  Yes Simmons-Robinson, Makiera, MD  oxybutynin (DITROPAN XL) 15 MG 24 hr tablet Take 1 tablet (15 mg total) by mouth daily. 04/10/21  Yes Verlon Au, NP  progesterone (PROMETRIUM) 200 MG capsule TAKE ONE CAPSULE BY MOUTH AT BEDTIME WITH FOOD FOR 12 DAYS SEQUENTIALLY PER 28 DAY CYCLE 05/28/21  Yes Verlon Au, NP  promethazine (PHENERGAN) 25 MG tablet Take 0.5-1 tablets (12.5-25 mg total) by mouth every 12 (twelve) hours as needed for nausea or vomiting. 12/12/21  Yes Simmons-Robinson, Makiera, MD  amphetamine-dextroamphetamine (ADDERALL) 10 MG tablet Take 1 tablet (10 mg total) by mouth 2 (two) times daily with a meal. 07/02/20   [name], MD  amphetamine-dextroamphetamine (ADDERALL) 10 MG tablet Take 1 tablet (10 mg total) by mouth 2 (two) times daily with a meal. 07/02/20   [name], MD  lidocaine-prilocaine (EMLA) cream Apply to affected area once 10/09/21   Sindy Guadeloupe, MD  ondansetron (ZOFRAN) 8 MG tablet  Take 8 mg by mouth every 8 (eight) hours as needed for nausea or vomiting. Patient not taking: Reported on 12/15/2021    [provider]   Physical Exam: Vitals:   12/15/21 1645 12/15/21 1829 12/15/21 1854 12/15/21 1945  BP: (!) 213/98 (!) 175/112 126/83   Pulse: 85 81 81   Resp:  18 18   Temp:  99.6 F (37.6 C) 99.3 F (37.4 C) 98.8 F (37.1 C)  TempSrc:  Axillary    SpO2: 100% 99% 95%   Weight:       Constitutional: appears ***, NAD, calm, comfortable Eyes: PERRL, lids and conjunctivae normal ENMT: Mucous membranes are moist. Posterior pharynx clear of any exudate or lesions. Age-appropriate dentition. Hearing appropriate/loss*** Neck: normal, supple, no masses, no thyromegaly Respiratory: clear to auscultation bilaterally, no wheezing, no crackles. Normal respiratory effort. No accessory muscle use.  Cardiovascular: Regular rate and rhythm, no murmurs / rubs / gallops. No extremity edema. 2+ pedal pulses. No carotid bruits.  Abdomen: no tenderness, no masses palpated, no hepatosplenomegaly. Bowel sounds positive.  Musculoskeletal: no clubbing / cyanosis. No joint deformity upper and lower extremities. Good ROM, no contractures, no atrophy. Normal muscle tone.  Skin: no rashes, lesions, ulcers. No induration Neurologic: Sensation intact. Strength 5/5 in all 4.  Psychiatric: Normal judgment and insight. Alert and oriented x 3. Normal mood.   EKG: independently reviewed, showing ***  Chest x-ray on Admission: I personally reviewed and I agree*** with radiologist reading as below.  CT HEAD WO CONTRAST (5MM)  Result Date: 12/15/2021 CLINICAL DATA:  Headache, chronic with new features. EXAM: CT HEAD WITHOUT CONTRAST TECHNIQUE: Contiguous axial images were obtained from the base of the skull through the vertex without intravenous contrast. RADIATION DOSE REDUCTION: This exam was performed according to the departmental dose-optimization program which includes automated exposure  control, adjustment of the mA and/or kV according to patient size and/or use of iterative reconstruction technique. COMPARISON:  CT examination dated October 29, 2021 FINDINGS: Brain: No evidence of acute infarction, hemorrhage, hydrocephalus, extra-axial collection or mass lesion/mass effect. Vascular: No hyperdense vessel or unexpected calcification. Skull: Normal. Negative for fracture or focal lesion. Sinuses/Orbits: No acute finding. Other: None. IMPRESSION: No acute intracranial abnormality. Electronically Signed   By: Keane Police D.O.   On: 12/15/2021 18:18   CT CHEST ABDOMEN PELVIS W CONTRAST  Result Date: 12/15/2021 CLINICAL DATA:  Left lower quadrant pain, nausea, vomiting, diarrhea, history of cervical cancer, evaluate for metastatic disease, unintended weight loss * Tracking Code: BO * EXAM: CT CHEST, ABDOMEN, AND PELVIS WITH CONTRAST TECHNIQUE: Multidetector CT imaging of the chest, abdomen and pelvis was performed following the standard protocol during bolus administration  of intravenous contrast. RADIATION DOSE REDUCTION: This exam was performed according to the departmental dose-optimization program which includes automated exposure control, adjustment of the mA and/or kV according to patient size and/or use of iterative reconstruction technique. CONTRAST:  143m OMNIPAQUE IOHEXOL 300 MG/ML  SOLN COMPARISON:  CT chest, 06/09/2021 CT chest abdomen pelvis, 01/01/2020 FINDINGS: CT CHEST FINDINGS Cardiovascular: Right chest port catheter. Normal heart size. No pericardial effusion. Mediastinum/Nodes: No enlarged mediastinal, hilar, or axillary lymph nodes. Small hiatal hernia. Thyroid gland, trachea, and esophagus demonstrate no significant findings. Lungs/Pleura: Lungs are clear. No pleural effusion or pneumothorax. Musculoskeletal: No chest wall abnormality. No acute osseous findings. CT ABDOMEN PELVIS FINDINGS Hepatobiliary: No solid liver abnormality is seen. No gallstones, gallbladder wall  thickening, or biliary dilatation. Pancreas: Unremarkable. No pancreatic ductal dilatation or surrounding inflammatory changes. Spleen: Normal in size without significant abnormality. Adrenals/Urinary Tract: Adrenal glands are unremarkable. Kidneys are normal, without renal calculi, solid lesion, or hydronephrosis. Bladder is unremarkable. Stomach/Bowel: Stomach is within normal limits. Appendix appears normal. The colon is generally decompressed, however there is a mildly thickened, inflamed appearance throughout, for example in the transverse colon (series 2, image 73 and in the descending colon (series 5, image 51). Vascular/Lymphatic: Aortic atherosclerosis. No enlarged abdominal or pelvic lymph nodes. Reproductive: No mass or other abnormality. Other: No abdominal wall hernia or abnormality. No ascites. Musculoskeletal: No acute osseous findings. IMPRESSION: 1. The colon is generally decompressed, however there is a mildly thickened, inflamed appearance throughout, for example in the transverse colon and in the descending colon. Appearance suggests nonspecific infectious or inflammatory colitis. Correlate with referable signs and symptoms. 2. No CT evidence of lymphadenopathy or metastatic disease in the chest, abdomen, or pelvis. 3. Small hiatal hernia. 4. Aortic atherosclerosis, advanced for patient age. Aortic Atherosclerosis (ICD10-I70.0). Electronically Signed   By: ADelanna AhmadiM.D.   On: 12/15/2021 16:13    Labs on Admission: I have personally reviewed following labs  CBC: Recent Labs  Lab 12/15/21 1348  WBC 16.3*  HGB 15.4*  HCT 43.4  MCV 87.7  PLT 4540   Basic Metabolic Panel: Recent Labs  Lab 12/15/21 1348 12/15/21 2304  NA 138  --   K 2.8* 3.2*  CL 104  --   CO2 20*  --   GLUCOSE 119*  --   BUN 12  --   CREATININE 0.73  --   CALCIUM 9.1  --   MG 2.0  --    GFR: Estimated Creatinine Clearance: 134.5 mL/min (by C-G formula based on SCr of 0.73 mg/dL).  Liver Function  Tests: Recent Labs  Lab 12/15/21 1348  AST 21  ALT 26  ALKPHOS 64  BILITOT 1.0  PROT 7.9  ALBUMIN 4.2   Recent Labs  Lab 12/15/21 1348  LIPASE 64*   Thyroid Function Tests: Recent Labs    12/15/21 1348  TSH 0.277*  FREET4 1.30*   Urine analysis:    Component Value Date/Time   COLORURINE YELLOW (A) 12/15/2021 1348   APPEARANCEUR HAZY (A) 12/15/2021 1348   APPEARANCEUR Hazy (A) 06/12/2019 1104   LABSPEC 1.017 12/15/2021 1348   LABSPEC 1.006 09/11/2011 1610   PHURINE 8.0 12/15/2021 1348   GLUCOSEU NEGATIVE 12/15/2021 1348   GLUCOSEU Negative 09/11/2011 1610   HGBUR SMALL (A) 12/15/2021 1348   BILIRUBINUR NEGATIVE 12/15/2021 1348   BILIRUBINUR Negative 12/05/2021 1651   BILIRUBINUR Negative 06/12/2019 1104   BILIRUBINUR Negative 09/11/2011 1610   KETONESUR 5 (A) 12/15/2021 1348   PROTEINUR 30 (A)  12/15/2021 1348   UROBILINOGEN 0.2 12/05/2021 1651   NITRITE NEGATIVE 12/15/2021 1348   LEUKOCYTESUR NEGATIVE 12/15/2021 1348   LEUKOCYTESUR Negative 09/11/2011 1610   Dr. Tobie Poet [facility]  If 7PM-7AM, please contact overnight-coverage provider If 7AM-7PM, please contact day coverage provider [email or web address]  12/15/2021, 11:30 PM

## 2021-12-16 DIAGNOSIS — F909 Attention-deficit hyperactivity disorder, unspecified type: Secondary | ICD-10-CM | POA: Diagnosis present

## 2021-12-16 DIAGNOSIS — G629 Polyneuropathy, unspecified: Secondary | ICD-10-CM | POA: Diagnosis present

## 2021-12-16 DIAGNOSIS — Z20822 Contact with and (suspected) exposure to covid-19: Secondary | ICD-10-CM | POA: Diagnosis present

## 2021-12-16 DIAGNOSIS — F419 Anxiety disorder, unspecified: Secondary | ICD-10-CM | POA: Diagnosis present

## 2021-12-16 DIAGNOSIS — E063 Autoimmune thyroiditis: Secondary | ICD-10-CM | POA: Diagnosis present

## 2021-12-16 DIAGNOSIS — R112 Nausea with vomiting, unspecified: Secondary | ICD-10-CM | POA: Diagnosis not present

## 2021-12-16 DIAGNOSIS — R519 Headache, unspecified: Secondary | ICD-10-CM | POA: Diagnosis present

## 2021-12-16 DIAGNOSIS — Z880 Allergy status to penicillin: Secondary | ICD-10-CM | POA: Diagnosis not present

## 2021-12-16 DIAGNOSIS — E876 Hypokalemia: Secondary | ICD-10-CM | POA: Diagnosis present

## 2021-12-16 DIAGNOSIS — C539 Malignant neoplasm of cervix uteri, unspecified: Secondary | ICD-10-CM | POA: Diagnosis present

## 2021-12-16 DIAGNOSIS — Z79899 Other long term (current) drug therapy: Secondary | ICD-10-CM | POA: Diagnosis not present

## 2021-12-16 DIAGNOSIS — Z79890 Hormone replacement therapy (postmenopausal): Secondary | ICD-10-CM | POA: Diagnosis not present

## 2021-12-16 DIAGNOSIS — H9192 Unspecified hearing loss, left ear: Secondary | ICD-10-CM | POA: Diagnosis present

## 2021-12-16 DIAGNOSIS — F32A Depression, unspecified: Secondary | ICD-10-CM | POA: Diagnosis present

## 2021-12-16 DIAGNOSIS — R197 Diarrhea, unspecified: Secondary | ICD-10-CM | POA: Diagnosis present

## 2021-12-16 DIAGNOSIS — R1084 Generalized abdominal pain: Secondary | ICD-10-CM | POA: Diagnosis present

## 2021-12-16 DIAGNOSIS — F17210 Nicotine dependence, cigarettes, uncomplicated: Secondary | ICD-10-CM | POA: Diagnosis present

## 2021-12-16 LAB — CBC
HCT: 36.3 % (ref 36.0–46.0)
Hemoglobin: 12.5 g/dL (ref 12.0–15.0)
MCH: 30.9 pg (ref 26.0–34.0)
MCHC: 34.4 g/dL (ref 30.0–36.0)
MCV: 89.9 fL (ref 80.0–100.0)
Platelets: 352 10*3/uL (ref 150–400)
RBC: 4.04 MIL/uL (ref 3.87–5.11)
RDW: 12.3 % (ref 11.5–15.5)
WBC: 9.1 10*3/uL (ref 4.0–10.5)
nRBC: 0 % (ref 0.0–0.2)

## 2021-12-16 LAB — BASIC METABOLIC PANEL
Anion gap: 5 (ref 5–15)
BUN: 11 mg/dL (ref 6–20)
CO2: 26 mmol/L (ref 22–32)
Calcium: 8.5 mg/dL — ABNORMAL LOW (ref 8.9–10.3)
Chloride: 108 mmol/L (ref 98–111)
Creatinine, Ser: 0.83 mg/dL (ref 0.44–1.00)
GFR, Estimated: >60 mL/min (ref >60)
Glucose, Bld: 109 mg/dL — ABNORMAL HIGH (ref 70–99)
Potassium: 3.2 mmol/L — ABNORMAL LOW (ref 3.5–5.1)
Sodium: 139 mmol/L (ref 135–145)

## 2021-12-16 LAB — GASTROINTESTINAL PANEL BY PCR, STOOL (REPLACES STOOL CULTURE)

## 2021-12-16 LAB — C DIFFICILE QUICK SCREEN W PCR REFLEX
C Diff antigen: NEGATIVE
C Diff interpretation: NOT DETECTED
C Diff toxin: NEGATIVE

## 2021-12-16 LAB — PROCALCITONIN: Procalcitonin: <0.1 ng/mL

## 2021-12-16 MED ORDER — KETOROLAC TROMETHAMINE 15 MG/ML IJ SOLN
15.0000 mg | Freq: Four times a day (QID) | INTRAMUSCULAR | Status: DC | PRN
  Administered 2021-12-16: 15 mg via INTRAVENOUS
  Filled 2021-12-16: qty 1

## 2021-12-16 MED ORDER — POTASSIUM CHLORIDE 10 MEQ/100ML IV SOLN
10.0000 meq | INTRAVENOUS | Status: AC
  Administered 2021-12-16 (×3): 10 meq via INTRAVENOUS
  Filled 2021-12-16 (×2): qty 100

## 2021-12-16 MED ORDER — CHLORHEXIDINE GLUCONATE CLOTH 2 % EX PADS
6.0000 | MEDICATED_PAD | Freq: Every day | CUTANEOUS | Status: DC
  Administered 2021-12-16 – 2021-12-17 (×2): 6 via TOPICAL

## 2021-12-16 MED ORDER — POTASSIUM CHLORIDE CRYS ER 20 MEQ PO TBCR
20.0000 meq | EXTENDED_RELEASE_TABLET | Freq: Once | ORAL | Status: AC
  Administered 2021-12-16: 20 meq via ORAL
  Filled 2021-12-16: qty 1

## 2021-12-16 NOTE — Progress Notes (Signed)
Progress Note   Patient: Kristy Gordon CBJ:[id number] DOB: March 12, 1986 DOA: 12/15/2021     0 DOS: the patient was seen and examined on 12/16/2021   Brief hospital course: Ms. Emmilia Sowder is a 36 year old female with history of ADHD, hypothyroid, Hashimoto's thyroiditis, cervical cancer, neuropathy, depression, anxiety, who presents emergency department for chief concerns of intractable nausea and vomiting.  Initial vitals in the emergency department showed temperature of 98.2, respiration rate of 23, heart rate of 85, initial blood pressure 208/113, SPO2 of 96% on room air.  Serum sodium is 138, potassium 2.8, chloride 104, bicarb 20, BUN of 12, serum creatinine of 0.73, GFR greater than 60, nonfasting blood glucose 119, WBC 16.3, hemoglobin 15.4,  platelets of 490.  Magnesium is 2.0.  ED treatment: Toradol 15 mg IV, labetalol 5 mg IV, morphine 4 mg x 1, ondansetron 4 mg IV x2, Phenergan 25 mg IV x2, sodium chloride 1 L bolus  Admitted to medicine service. N/V was refractory to both Zofran and Phenergan. Responded better to IV Haldol.    Assessment and Plan: * Intractable nausea and vomiting - Differential diagnosis include gastroparesis, cyclic vomiting syndrome, cannabis hyperemesis syndrome, gastroenteritis. - Advised patient for cessation of THC. She uses edibles, last used 1 week ago. - Haldol 5 mg IV every 6 hours as needed for intractable nausea and vomiting not responsive to ondansetron and/or Phenergan  Leukocytosis WBC was 16.3 on admission, improved to 9.1 this AM.   - UA was negative for leukocytes and nitrates - Procalcitonin < 0.10 --GI panel, C. difficile both negative --Monitor CBC  Hypokalemia K was 2.8 on admission, started on replacement.  K 3.2 this AM, further IV and PO replacement ordered. --BMP in AM --Replace K PRN  Tobacco use - PRN nicotine patch ordered  Hypothyroidism - Levothyroxine 200 mcg daily before breakfast resume  Cervical cancer  ([place]) .  Clinical depression - Duloxetine 60 mg daily resumed  Anxiety - Ativan 2 mg IV every 6 hours as needed for anxiety not relieved by home dosing - Duloxetine 60 mg daily resumed - Home alprazolam 1 mg p.o. 3 times daily as needed for anxiety resumed        Subjective: Pt seen awake resting in bed this afternoon.  She had been feeling better earlier, but short while ago began vomiting again.  Also reports a headache.   Confirms Haldol provided more relief than either Zofran or Phenergan (she had both at home with no relief, and also not effective here in IV form when tried).  Confirms THC used last one week ago, takes edibles.    Physical Exam: Vitals:   12/16/21 0406 12/16/21 0851 12/16/21 1244 12/16/21 1626  BP: 101/60 123/73 129/88 (!) 186/92  Pulse: 70 73 67 73  Resp: '18 16 16 16  '$ Temp: 98 F (36.7 C) 98.5 F (36.9 C) 98.6 F (37 C) 98.6 F (37 C)  TempSrc:    Oral  SpO2: 100% 99% 99%   Weight:       General exam: awake, alert, no acute distress, mildly ill-appearing HEENT: atraumatic, clear conjunctiva, anicteric sclera, moist mucus membranes, hearing grossly normal  Respiratory system: on room air, normal respiratory effort. Cardiovascular system: normal S1/S2, RRR, no pedal edema.   Gastrointestinal system: soft, NT, ND, no HSM felt, +bowel sounds. Central nervous system: A&O x3. no gross focal neurologic deficits, normal speech Extremities: moves all, no edema, normal tone Skin: dry, intact, normal temperature Psychiatry: normal mood, congruent affect, judgement and  insight appear normal  Data Reviewed:  Notable labs -- K 3.2,  glucose 109, Ca 8.5, normal CBC, procal < 0.10.  Negative C diff and GI panel  Family Communication: none  Disposition: Status is: Inpatient Remains inpatient appropriate because: ongiong N/V requiring IV fluids and electrolytes replacements, IV antiemetics   Planned Discharge Destination: Home    Time spent: 35  minutes  Author: Ezekiel Slocumb, DO 12/16/2021 5:14 PM  For on call review [email or web address].

## 2021-12-17 DIAGNOSIS — R112 Nausea with vomiting, unspecified: Secondary | ICD-10-CM | POA: Diagnosis not present

## 2021-12-17 LAB — BASIC METABOLIC PANEL
Anion gap: 7 (ref 5–15)
BUN: 8 mg/dL (ref 6–20)
CO2: 22 mmol/L (ref 22–32)
Calcium: 8.6 mg/dL — ABNORMAL LOW (ref 8.9–10.3)
Chloride: 110 mmol/L (ref 98–111)
Creatinine, Ser: 0.79 mg/dL (ref 0.44–1.00)
GFR, Estimated: >60 mL/min (ref >60)
Glucose, Bld: 111 mg/dL — ABNORMAL HIGH (ref 70–99)
Potassium: 3.3 mmol/L — ABNORMAL LOW (ref 3.5–5.1)
Sodium: 139 mmol/L (ref 135–145)

## 2021-12-17 LAB — HIV ANTIBODY (ROUTINE TESTING W REFLEX): HIV Screen 4th Generation wRfx: NONREACTIVE

## 2021-12-17 LAB — MAGNESIUM: Magnesium: 2.1 mg/dL (ref 1.7–2.4)

## 2021-12-17 MED ORDER — LEVOTHYROXINE SODIUM 50 MCG PO TABS: 150.0000 ug | ORAL_TABLET | Freq: Every day | ORAL | Status: DC

## 2021-12-17 MED ORDER — HEPARIN SOD (PORK) LOCK FLUSH 100 UNIT/ML IV SOLN
500.0000 [IU] | Freq: Once | INTRAVENOUS | Status: AC
  Administered 2021-12-17: 500 [IU] via INTRAVENOUS
  Filled 2021-12-17: qty 5

## 2021-12-17 MED ORDER — HALOPERIDOL 0.5 MG PO TABS: 0.5000 mg | ORAL_TABLET | Freq: Three times a day (TID) | ORAL | 0 refills | Status: DC | PRN

## 2021-12-17 MED ORDER — LEVOTHYROXINE SODIUM 150 MCG PO TABS: ORAL_TABLET | ORAL | 1 refills | Status: DC

## 2021-12-17 NOTE — Discharge Summary (Signed)
Kristy Gordon IWL:[id number] DOB: 1985-04-15 DOA: 12/15/2021  PCP: [name], MD  Admit date: 12/15/2021 Discharge date: 12/17/2021  Time spent: 35 minutes  Recommendations for Outpatient Follow-up:  PCP f/u, repeat TSH in about 6 weeks    Discharge Diagnoses:  Principal Problem:   Intractable nausea and vomiting Active Problems:   Anxiety   Clinical depression   Cervical cancer ([place])   Hypothyroidism   Tobacco use   Hypokalemia   Leukocytosis   Discharge Condition: stable  Diet recommendation: regular  Filed Weights   12/15/21 1309  Weight: 124.7 kg    History of present illness:  From admission h and p [name] is a 36 year old female with history of ADHD, hypothyroid, Hashimoto's thyroiditis, cervical cancer, neuropathy, depression, anxiety, who presents emergency department for chief concerns of intractable nausea and vomiting.  At bedside, she is able to tell me her name, age, current location.    She endorses her last time use of THC was 1 week ago.  She reports worsening nausea and vomiting over the past week.  She reports the symptoms are relieved with warm bath/shower.  She endorses poor p.o. intake.  She denies chest pain, shortness of breath, abdominal pain, dysuria, hematuria.  She endorses diarrhea over the past few days.    Hospital Course:  Admitted for intractable nausea and vomiting. CT abdomen/pelvis nothing acute. Mild hypokalemia on labs, repleted. Did have some diarrhea, gi pathogen panel and c diff negative. Tsh low. 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 Urine pregnancy test negative. Treated with IV fluids and anti-emetics and symptoms much improved, no vomiting overnight or this morning and tolerated breakfast, feeling well. Discussed total THC abstinence. Patient says haldol by far most effective anti-emetic so will give her a short course of that to use as needed. Will decrease dose of home levothyroxine, will need pcp  f/u  Procedures: none   Consultations: none  Discharge Exam: Vitals:   12/17/21 0412 12/17/21 0742  BP: 115/78 127/78  Pulse: 72 83  Resp: 18 18  Temp: 98.4 F (36.9 C) 98 F (36.7 C)  SpO2: 99% 100%    General: NAD Cardiovascular: RRR Respiratory: CTAB Abdomen: soft, non-tender  Discharge Instructions   Discharge Instructions     Diet general   Complete by: As directed    Increase activity slowly   Complete by: As directed       Allergies as of 12/17/2021       Reactions   Amoxicillin Rash   Did it involve swelling of the face/tongue/throat, SOB, or low BP? No Did it involve sudden or severe rash/hives, skin peeling, or any reaction on the inside of your mouth or nose? No Did you need to seek medical attention at a hospital or doctor's office? No When did it last happen?  Childhood     If all above answers are "NO", may proceed with cephalosporin use.   Penicillins Hives   Did it involve swelling of the face/tongue/throat, SOB, or low BP? No Did it involve sudden or severe rash/hives, skin peeling, or any reaction on the inside of your mouth or nose? No Did you need to seek medical attention at a hospital or doctor's office? No When did it last happen?  Childhood     If all above answers are "NO", may proceed with cephalosporin use.        Medication List     TAKE these medications    ALPRAZolam 1 MG tablet  Commonly known as: XANAX TAKE 1 TABLET BY MOUTH THREE TIMES DAILY AS NEEDED   amphetamine-dextroamphetamine 10 MG tablet Commonly known as: Adderall Take 1 tablet (10 mg total) by mouth 2 (two) times daily with a meal. What changed:  when to take this reasons to take this   cyclobenzaprine 5 MG tablet Commonly known as: FLEXERIL Take 1 tablet (5 mg total) by mouth 3 (three) times daily as needed for muscle spasms.   DULoxetine 60 MG capsule Commonly known as: CYMBALTA Take 1 capsule by mouth once daily   estradiol 1 MG  tablet Commonly known as: ESTRACE Take 1 tablet (1 mg total) by mouth daily.   gabapentin 300 MG capsule Commonly known as: NEURONTIN TAKE 1 CAPSULE BY MOUTH THREE TIMES DAILY   haloperidol 0.5 MG tablet Commonly known as: HALDOL Take 1 tablet (0.5 mg total) by mouth every 8 (eight) hours as needed (nausea).   levothyroxine 150 MCG tablet Commonly known as: SYNTHROID One tab by mouth daily What changed:  medication strength See the new instructions.   lidocaine-prilocaine cream Commonly known as: EMLA Apply to affected area once   meloxicam 7.5 MG tablet Commonly known as: MOBIC Take 1 tablet (7.5 mg total) by mouth daily.   oxybutynin 15 MG 24 hr tablet Commonly known as: DITROPAN XL Take 1 tablet (15 mg total) by mouth daily.   progesterone 200 MG capsule Commonly known as: PROMETRIUM TAKE ONE CAPSULE BY MOUTH AT BEDTIME WITH FOOD FOR 12 DAYS SEQUENTIALLY PER 28 DAY CYCLE   promethazine 25 MG tablet Commonly known as: PHENERGAN Take 0.5-1 tablets (12.5-25 mg total) by mouth every 12 (twelve) hours as needed for nausea or vomiting.       Allergies  Allergen Reactions   Amoxicillin Rash    Did it involve swelling of the face/tongue/throat, SOB, or low BP? No Did it involve sudden or severe rash/hives, skin peeling, or any reaction on the inside of your mouth or nose? No Did you need to seek medical attention at a hospital or doctor's office? No When did it last happen?  Childhood     If all above answers are "NO", may proceed with cephalosporin use.    Penicillins Hives    Did it involve swelling of the face/tongue/throat, SOB, or low BP? No Did it involve sudden or severe rash/hives, skin peeling, or any reaction on the inside of your mouth or nose? No Did you need to seek medical attention at a hospital or doctor's office? No When did it last happen?  Childhood     If all above answers are "NO", may proceed with cephalosporin use.     Follow-up Information      [name], MD In 2 days.   Specialty: Family Medicine Contact information: [address] [place] [address] [phone]                  The results of significant diagnostics from this hospitalization (including imaging, microbiology, ancillary and laboratory) are listed below for reference.    Significant Diagnostic Studies: CT HEAD WO CONTRAST (5MM)  Result Date: 12/15/2021 CLINICAL DATA:  Headache, chronic with new features. EXAM: CT HEAD WITHOUT CONTRAST TECHNIQUE: Contiguous axial images were obtained from the base of the skull through the vertex without intravenous contrast. RADIATION DOSE REDUCTION: This exam was performed according to the departmental dose-optimization program which includes automated exposure control, adjustment of the mA and/or kV according to patient size and/or use  of iterative reconstruction technique. COMPARISON:  CT examination dated October 29, 2021 FINDINGS: Brain: No evidence of acute infarction, hemorrhage, hydrocephalus, extra-axial collection or mass lesion/mass effect. Vascular: No hyperdense vessel or unexpected calcification. Skull: Normal. Negative for fracture or focal lesion. Sinuses/Orbits: No acute finding. Other: None. IMPRESSION: No acute intracranial abnormality. Electronically Signed   By: Keane Police D.O.   On: 12/15/2021 18:18   CT CHEST ABDOMEN PELVIS W CONTRAST  Result Date: 12/15/2021 CLINICAL DATA:  Left lower quadrant pain, nausea, vomiting, diarrhea, history of cervical cancer, evaluate for metastatic disease, unintended weight loss * Tracking Code: BO * EXAM: CT CHEST, ABDOMEN, AND PELVIS WITH CONTRAST TECHNIQUE: Multidetector CT imaging of the chest, abdomen and pelvis was performed following the standard protocol during bolus administration of intravenous contrast. RADIATION DOSE REDUCTION: This exam was performed according to the departmental dose-optimization program which includes automated  exposure control, adjustment of the mA and/or kV according to patient size and/or use of iterative reconstruction technique. CONTRAST:  111m OMNIPAQUE IOHEXOL 300 MG/ML  SOLN COMPARISON:  CT chest, 06/09/2021 CT chest abdomen pelvis, 01/01/2020 FINDINGS: CT CHEST FINDINGS Cardiovascular: Right chest port catheter. Normal heart size. No pericardial effusion. Mediastinum/Nodes: No enlarged mediastinal, hilar, or axillary lymph nodes. Small hiatal hernia. Thyroid gland, trachea, and esophagus demonstrate no significant findings. Lungs/Pleura: Lungs are clear. No pleural effusion or pneumothorax. Musculoskeletal: No chest wall abnormality. No acute osseous findings. CT ABDOMEN PELVIS FINDINGS Hepatobiliary: No solid liver abnormality is seen. No gallstones, gallbladder wall thickening, or biliary dilatation. Pancreas: Unremarkable. No pancreatic ductal dilatation or surrounding inflammatory changes. Spleen: Normal in size without significant abnormality. Adrenals/Urinary Tract: Adrenal glands are unremarkable. Kidneys are normal, without renal calculi, solid lesion, or hydronephrosis. Bladder is unremarkable. Stomach/Bowel: Stomach is within normal limits. Appendix appears normal. The colon is generally decompressed, however there is a mildly thickened, inflamed appearance throughout, for example in the transverse colon (series 2, image 73 and in the descending colon (series 5, image 51). Vascular/Lymphatic: Aortic atherosclerosis. No enlarged abdominal or pelvic lymph nodes. Reproductive: No mass or other abnormality. Other: No abdominal wall hernia or abnormality. No ascites. Musculoskeletal: No acute osseous findings. IMPRESSION: 1. The colon is generally decompressed, however there is a mildly thickened, inflamed appearance throughout, for example in the transverse colon and in the descending colon. Appearance suggests nonspecific infectious or inflammatory colitis. Correlate with referable signs and symptoms. 2.  No CT evidence of lymphadenopathy or metastatic disease in the chest, abdomen, or pelvis. 3. Small hiatal hernia. 4. Aortic atherosclerosis, advanced for patient age. Aortic Atherosclerosis (ICD10-I70.0). Electronically Signed   By: ADelanna AhmadiM.D.   On: 12/15/2021 16:13    Microbiology: Recent Results (from the past 240 hour(s))  SARS Coronavirus 2 by RT PCR ([facility] *cepheid single result test* Anterior Nasal Swab     Status: None   Collection Time: 12/15/21  6:22 PM   Specimen: Anterior Nasal Swab  Result Value Ref Range Status   SARS Coronavirus 2 by RT PCR NEGATIVE NEGATIVE Final    Comment: (NOTE) SARS-CoV-2 target nucleic acids are NOT DETECTED.  The SARS-CoV-2 RNA is generally detectable in upper and lower respiratory specimens during the acute phase of infection. The lowest concentration of SARS-CoV-2 viral copies this assay can detect is 250 copies / mL. A negative result does not preclude SARS-CoV-2 infection and should not be used as the sole basis for treatment or other patient management decisions.  A negative result may  occur with improper specimen collection / handling, submission of specimen other than nasopharyngeal swab, presence of viral mutation(s) within the areas targeted by this assay, and inadequate number of viral copies (<250 copies / mL). A negative result must be combined with clinical observations, patient history, and epidemiological information.  Fact Sheet for Patients:   https://www.patel.info/  Fact Sheet for Healthcare Providers: https://hall.com/  This test is not yet approved or  cleared by the [place] FDA and has been authorized for detection and/or diagnosis of SARS-CoV-2 by FDA under an Emergency Use Authorization (EUA).  This EUA will remain in effect (meaning this test can be used) for the duration of the COVID-19 declaration under Section  564(b)(1) of the Act, 21 U.S.C. section 360bbb-3(b)(1), unless the authorization is terminated or revoked sooner.  Performed at [facility], [address] [place], [place] [address]   Gastrointestinal Panel by PCR , Stool     Status: None   Collection Time: 12/16/21 10:12 AM   Specimen: Stool  Result Value Ref Range Status   Campylobacter species NOT DETECTED NOT DETECTED Final   Plesimonas shigelloides NOT DETECTED NOT DETECTED Final   Salmonella species NOT DETECTED NOT DETECTED Final   Yersinia enterocolitica NOT DETECTED NOT DETECTED Final   Vibrio species NOT DETECTED NOT DETECTED Final   Vibrio cholerae NOT DETECTED NOT DETECTED Final   Enteroaggregative E coli (EAEC) NOT DETECTED NOT DETECTED Final   Enteropathogenic E coli (EPEC) NOT DETECTED NOT DETECTED Final   Enterotoxigenic E coli (ETEC) NOT DETECTED NOT DETECTED Final   Shiga like toxin producing E coli (STEC) NOT DETECTED NOT DETECTED Final   Shigella/Enteroinvasive E coli (EIEC) NOT DETECTED NOT DETECTED Final   Cryptosporidium NOT DETECTED NOT DETECTED Final   Cyclospora cayetanensis NOT DETECTED NOT DETECTED Final   Entamoeba histolytica NOT DETECTED NOT DETECTED Final   Giardia lamblia NOT DETECTED NOT DETECTED Final   Adenovirus F40/41 NOT DETECTED NOT DETECTED Final   Astrovirus NOT DETECTED NOT DETECTED Final   Norovirus GI/GII NOT DETECTED NOT DETECTED Final   Rotavirus A NOT DETECTED NOT DETECTED Final   Sapovirus (I, II, IV, and V) NOT DETECTED NOT DETECTED Final    Comment: Performed at [facility], [address] [place], [place] [address]  C Difficile Quick Screen w PCR reflex     Status: None   Collection Time: 12/16/21 10:12 AM  Result Value Ref Range Status   C Diff antigen NEGATIVE NEGATIVE Final   C Diff toxin NEGATIVE NEGATIVE Final   C Diff interpretation No C. difficile detected.  Final    Comment: Performed at [facility], [address] [place],  [place] [address]     Labs: Basic Metabolic Panel: Recent Labs  Lab 12/15/21 1348 12/15/21 2304 12/16/21 0522 12/17/21 0442  NA 138  --  139 139  K 2.8* 3.2* 3.2* 3.3*  CL 104  --  108 110  CO2 20*  --  26 22  GLUCOSE 119*  --  109* 111*  BUN 12  --  11 8  CREATININE 0.73  --  0.83 0.79  CALCIUM 9.1  --  8.5* 8.6*  MG 2.0  --   --  2.1   Liver Function Tests: Recent Labs  Lab 12/15/21 1348  AST 21  ALT 26  ALKPHOS 64  BILITOT 1.0  PROT 7.9  ALBUMIN 4.2   Recent Labs  Lab 12/15/21 1348  LIPASE 64*   No results for input(s): "  AMMONIA" in the last 168 hours. CBC: Recent Labs  Lab 12/15/21 1348 12/16/21 0522  WBC 16.3* 9.1  HGB 15.4* 12.5  HCT 43.4 36.3  MCV 87.7 89.9  PLT 490* 352   Cardiac Enzymes: No results for input(s): "CKTOTAL", "CKMB", "CKMBINDEX", "TROPONINI" in the last 168 hours. BNP: BNP (last 3 results) No results for input(s): "BNP" in the last 8760 hours.  ProBNP (last 3 results) No results for input(s): "PROBNP" in the last 8760 hours.  CBG: No results for input(s): "GLUCAP" in the last 168 hours.     Signed:  Desma Maxim MD.  [facility] 12/17/2021, 10:55 AM

## 2021-12-18 ENCOUNTER — Inpatient Hospital Stay: Admission: RE | Admit: 2021-12-18 | Payer: BC Managed Care – PPO | Source: Ambulatory Visit

## 2021-12-22 ENCOUNTER — Encounter: Payer: Self-pay | Admitting: Oncology

## 2021-12-22 ENCOUNTER — Encounter: Payer: Self-pay | Admitting: Family Medicine

## 2021-12-22 ENCOUNTER — Ambulatory Visit (INDEPENDENT_AMBULATORY_CARE_PROVIDER_SITE_OTHER): Payer: BC Managed Care – PPO | Admitting: Family Medicine

## 2021-12-22 VITALS — BP 126/87 | HR 88 | Resp 16 | Wt 275.0 lb

## 2021-12-22 DIAGNOSIS — R112 Nausea with vomiting, unspecified: Secondary | ICD-10-CM

## 2021-12-22 DIAGNOSIS — M6289 Other specified disorders of muscle: Secondary | ICD-10-CM | POA: Diagnosis not present

## 2021-12-22 DIAGNOSIS — E063 Autoimmune thyroiditis: Secondary | ICD-10-CM

## 2021-12-22 DIAGNOSIS — Z23 Encounter for immunization: Secondary | ICD-10-CM | POA: Diagnosis not present

## 2021-12-22 DIAGNOSIS — Z6841 Body Mass Index (BMI) 40.0 and over, adult: Secondary | ICD-10-CM

## 2021-12-22 DIAGNOSIS — F3341 Major depressive disorder, recurrent, in partial remission: Secondary | ICD-10-CM

## 2021-12-22 DIAGNOSIS — E038 Other specified hypothyroidism: Secondary | ICD-10-CM | POA: Diagnosis not present

## 2021-12-22 DIAGNOSIS — C539 Malignant neoplasm of cervix uteri, unspecified: Secondary | ICD-10-CM

## 2021-12-22 MED ORDER — TRULICITY 0.75 MG/0.5ML ~~LOC~~ SOAJ: 0.7500 mg | SUBCUTANEOUS | 5 refills | Status: DC

## 2021-12-22 MED ORDER — DULOXETINE HCL 30 MG PO CPEP: 30.0000 mg | ORAL_CAPSULE | Freq: Every day | ORAL | 3 refills | Status: DC

## 2021-12-22 MED ORDER — LEVOTHYROXINE SODIUM 175 MCG PO TABS: 175.0000 ug | ORAL_TABLET | Freq: Every day | ORAL | 3 refills | Status: DC

## 2021-12-22 NOTE — Progress Notes (Signed)
Established patient visit  I,[name],acting as a scribe for Wilhemena Durie, MD.,have documented all relevant documentation on the behalf of Wilhemena Durie, MD,as directed by  Wilhemena Durie, MD while in the presence of Wilhemena Durie, MD.   Patient: Kristy Gordon   DOB: 03/23/1986   36 y.o. Female  MRN: [id number] Visit Date: 12/22/2021  Today's healthcare provider: Wilhemena Durie, MD   Chief Complaint  Patient presents with   Hospitalization Follow-up   Subjective    HPI  He comes in today for follow-up after hospital admission for intractable nausea and vomiting. Likely due to cannabis Stopped and is feeling better. He is doing fairly well and would like to cut back on her duloxetine dose and try something for weight loss.  Is prediabetic.BMI 43.07 Follow up Hospitalization  Patient was admitted to [facility] on 12/15/2021 and discharged on 12/17/2021. She was treated for abdominal pain and emesis. Treatment for this included see notes in chart. Telephone follow up was done on none  ----------------------------------------------------------------------------------------- - Patient states she is feeling much improved since hospital discharge.   Medications: Outpatient Medications Prior to Visit  Medication Sig   ALPRAZolam (XANAX) 1 MG tablet TAKE 1 TABLET BY MOUTH THREE TIMES DAILY AS NEEDED   amphetamine-dextroamphetamine (ADDERALL) 10 MG tablet Take 1 tablet (10 mg total) by mouth 2 (two) times daily with a meal. (Patient taking differently: Take 10 mg by mouth daily as needed (ADHD).)   cyclobenzaprine (FLEXERIL) 5 MG tablet Take 1 tablet (5 mg total) by mouth 3 (three) times daily as needed for muscle spasms.   DULoxetine (CYMBALTA) 60 MG capsule Take 1 capsule by mouth once daily   estradiol (ESTRACE) 1 MG tablet Take 1 tablet (1 mg total) by mouth daily.   gabapentin (NEURONTIN) 300 MG capsule TAKE 1 CAPSULE BY MOUTH THREE TIMES DAILY    haloperidol (HALDOL) 0.5 MG tablet Take 1 tablet (0.5 mg total) by mouth every 8 (eight) hours as needed (nausea).   levothyroxine (SYNTHROID) 150 MCG tablet One tab by mouth daily   lidocaine-prilocaine (EMLA) cream Apply to affected area once   meloxicam (MOBIC) 7.5 MG tablet Take 1 tablet (7.5 mg total) by mouth daily.   oxybutynin (DITROPAN XL) 15 MG 24 hr tablet Take 1 tablet (15 mg total) by mouth daily.   progesterone (PROMETRIUM) 200 MG capsule TAKE ONE CAPSULE BY MOUTH AT BEDTIME WITH FOOD FOR 12 DAYS SEQUENTIALLY PER 28 DAY CYCLE   promethazine (PHENERGAN) 25 MG tablet Take 0.5-1 tablets (12.5-25 mg total) by mouth every 12 (twelve) hours as needed for nausea or vomiting.   Facility-Administered Medications Prior to Visit  Medication Dose Route Frequency Provider   sodium chloride flush (NS) 0.9 % injection 10 mL  10 mL Intravenous PRN Sindy Guadeloupe, MD    Review of Systems  Constitutional:  Negative for appetite change, chills, fatigue and fever.  Respiratory:  Negative for chest tightness and shortness of breath.   Cardiovascular:  Negative for chest pain and palpitations.  Gastrointestinal:  Negative for abdominal pain, nausea and vomiting.  Neurological:  Negative for dizziness and weakness.        Objective    BP 126/87 (BP Location: Right Arm, Patient Position: Sitting, Cuff Size: Large)   Pulse 88   Resp 16   Wt 275 lb (124.7 kg)   LMP  (LMP Unknown)   SpO2 98%   BMI 43.07 kg/m  BP  Readings from Last 3 Encounters:  12/22/21 126/87  12/17/21 127/78  12/05/21 (!) 157/87   Wt Readings from Last 3 Encounters:  12/22/21 275 lb (124.7 kg)  12/15/21 275 lb (124.7 kg)  12/05/21 275 lb 12.8 oz (125.1 kg)      Physical Exam Vitals reviewed.  Constitutional:      General: She is not in acute distress.    Appearance: She is well-developed.  HENT:     Head: Normocephalic and atraumatic.     Right Ear: Hearing and external ear normal.     Left Ear: Hearing and  external ear normal.     Nose: Nose normal.  Eyes:     General: Lids are normal. No scleral icterus.       Right eye: No discharge.        Left eye: No discharge.     Conjunctiva/sclera: Conjunctivae normal.  Cardiovascular:     Rate and Rhythm: Normal rate and regular rhythm.     Heart sounds: Normal heart sounds.  Pulmonary:     Effort: Pulmonary effort is normal. No respiratory distress.  Skin:    Findings: No lesion or rash.  Neurological:     General: No focal deficit present.     Mental Status: She is alert and oriented to person, place, and time.  Psychiatric:        Mood and Affect: Mood normal.        Speech: Speech normal.        Behavior: Behavior normal.        Thought Content: Thought content normal.        Judgment: Judgment normal.       No results found for any visits on 12/22/21.  Assessment & Plan     1. Need for influenza vaccination  - Flu Vaccine QUAD 14moIM (Fluarix, Fluzone & Alfiuria Quad PF)  2. Need for Tdap vaccination  - Tdap vaccine greater than or equal to 7yo IM  3. Nausea and vomiting, unspecified vomiting type Thought to be secondary to cannabis use May need GI referral if it persists or recurs - Ambulatory referral to Gastroenterology  4. Intractable nausea and vomiting   5. Hypothyroidism due to Hashimoto's thyroiditis Crease Synthroid to 175  6. Pelvic floor dysfunction Followed by gynecology  7. 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[facility] Gynecology/oncology  8. Recurrent major depressive disorder, in partial remission (HCC) Crease duloxetine to 30  9. Class 3 severe obesity due to excess calories without serious comorbidity with body mass index (BMI) of 40.0 to 44.9 in adult ([facility] Consider starting Trulicity weekly for prediabetes and obesity   No follow-ups on file.      I, RWilhemena Durie MD, have reviewed all documentation for this visit. The documentation on 12/27/21 for the exam, diagnosis, procedures, and orders are  all accurate and complete.    Evani Shrider GCranford Mon MD  BAl[phone](phone) [phone](fax)  [place]

## 2021-12-26 ENCOUNTER — Telehealth: Payer: Self-pay

## 2021-12-26 NOTE — Patient Outreach (Signed)
Care Coordination  12/26/2021  Kristy Gordon 1985-12-28 [id number]   Transition Care Management Unsuccessful Follow-up Telephone Call  Date of discharge and from where:  12/25/21 [facility]   Attempts:  1st Attempt  Reason for unsuccessful TCM follow-up call:  Left voice message   Mickel Fuchs, [place], [place] Medicaid Team  [phone]

## 2021-12-29 ENCOUNTER — Telehealth: Payer: Self-pay

## 2021-12-29 NOTE — Patient Outreach (Signed)
Care Coordination  12/29/2021  Kristy Gordon Mar 22, 1986 [id number]   Transition Care Management Unsuccessful Follow-up Telephone Call  Date of discharge and from where:  12/25/21 [facility]   Attempts:  2nd Attempt  Reason for unsuccessful TCM follow-up call:  Left voice message    Mickel Fuchs, BSW, [place] Medicaid Team  [phone]

## 2021-12-30 ENCOUNTER — Encounter: Payer: Self-pay | Admitting: Family Medicine

## 2021-12-30 ENCOUNTER — Ambulatory Visit (INDEPENDENT_AMBULATORY_CARE_PROVIDER_SITE_OTHER): Payer: BC Managed Care – PPO | Admitting: Family Medicine

## 2021-12-30 VITALS — BP 136/96 | HR 86 | Temp 98.2°F | Resp 16 | Wt 268.2 lb

## 2021-12-30 DIAGNOSIS — R1115 Cyclical vomiting syndrome unrelated to migraine: Secondary | ICD-10-CM

## 2021-12-30 MED ORDER — ALPRAZOLAM 1 MG PO TABS: 1.0000 mg | ORAL_TABLET | Freq: Three times a day (TID) | ORAL | 0 refills | Status: DC | PRN

## 2021-12-30 MED ORDER — PROMETHAZINE HCL 25 MG PO TABS: 25.0000 mg | ORAL_TABLET | Freq: Two times a day (BID) | ORAL | 1 refills | Status: DC | PRN

## 2021-12-30 NOTE — Addendum Note (Signed)
Addended by: Adolph Pollack on: 12/30/2021 07:56 PM   Modules accepted: Orders

## 2021-12-30 NOTE — Progress Notes (Signed)
I,Sulibeya S Dimas,acting as a Education administrator for [employer], MD.,have documented all relevant documentation on the behalf of Eulis Foster, MD,as directed by  Eulis Foster, MD while in the presence of Eulis Foster, MD.    Established patient visit   Patient: Kristy Gordon   DOB: 05-01-1985   36 y.o. Female  MRN: [id number] Visit Date: 12/30/2021  Today's healthcare provider: Eulis Foster, MD   Chief Complaint  Patient presents with   Emesis   Subjective    HPI  Patient comes in with c/c of cyclical vomiting. She was seen admitted to the hospital on 12/15/2021 and discharged 12/17/2021 for Intractable Nausea and Vomiting and was also seen by Dr. Rosanna Randy on 09/18 for hospital follow-up. Patient also went to [facility] on 09/20 but left prior to being seen. She states that she was referred to GI in the past and had normal results at that time. She estimates this was in 2020. She states that she will often have pain in her left side of her abdomen prior to the vomiting starting. She reports having an episode vomiting in the past after finishing her chemo treatment but states that this has been much worse and is duration.  She states that she has been eating a limited diet to try to control her symptoms.  She was previously seen by Dr. Dennison Mascot at [facility] and will would like to return to this practice for further evaluation if possible.  She states that her Xanax prescription and promethazine help with her vomiting.  She states that she has not had vomiting in almost 3 days with her limited diet and being on her medication on a regular basis.   Medications: Outpatient Medications Prior to Visit  Medication Sig   amphetamine-dextroamphetamine (ADDERALL) 10 MG tablet Take 1 tablet (10 mg total) by mouth 2 (two) times daily with a meal. (Patient taking differently: Take 10 mg by mouth daily as needed (ADHD).)   cyclobenzaprine (FLEXERIL) 5  MG tablet Take 1 tablet (5 mg total) by mouth 3 (three) times daily as needed for muscle spasms.   Dulaglutide (TRULICITY) 4.27 CW/2.3JS SOPN Inject 0.75 mg into the skin once a week.   DULoxetine (CYMBALTA) 30 MG capsule Take 1 capsule (30 mg total) by mouth daily.   estradiol (ESTRACE) 1 MG tablet Take 1 tablet (1 mg total) by mouth daily.   gabapentin (NEURONTIN) 300 MG capsule TAKE 1 CAPSULE BY MOUTH THREE TIMES DAILY   haloperidol (HALDOL) 0.5 MG tablet Take 1 tablet (0.5 mg total) by mouth every 8 (eight) hours as needed (nausea).   levothyroxine (SYNTHROID) 175 MCG tablet Take 1 tablet (175 mcg total) by mouth daily.   lidocaine-prilocaine (EMLA) cream Apply to affected area once   meloxicam (MOBIC) 7.5 MG tablet Take 1 tablet (7.5 mg total) by mouth daily.   oxybutynin (DITROPAN XL) 15 MG 24 hr tablet Take 1 tablet (15 mg total) by mouth daily.   progesterone (PROMETRIUM) 200 MG capsule TAKE ONE CAPSULE BY MOUTH AT BEDTIME WITH FOOD FOR 12 DAYS SEQUENTIALLY PER 28 DAY CYCLE   [DISCONTINUED] ALPRAZolam (XANAX) 1 MG tablet TAKE 1 TABLET BY MOUTH THREE TIMES DAILY AS NEEDED   [DISCONTINUED] promethazine (PHENERGAN) 25 MG tablet Take 0.5-1 tablets (12.5-25 mg total) by mouth every 12 (twelve) hours as needed for nausea or vomiting.   Facility-Administered Medications Prior to Visit  Medication Dose Route Frequency Provider   sodium chloride flush (NS) 0.9 % injection 10 mL  10 mL  Intravenous PRN Sindy Guadeloupe, MD    Review of Systems  Gastrointestinal:  Positive for nausea. Negative for abdominal distention, abdominal pain, blood in stool, diarrhea and vomiting.       Objective    BP (!) 136/96 (BP Location: Right Arm, Patient Position: Sitting, Cuff Size: Large)   Pulse 86   Temp 98.2 F (36.8 C) (Oral)   Resp 16   Wt 268 lb 3.2 oz (121.7 kg)   LMP  (LMP Unknown)   SpO2 99%   BMI 42.01 kg/m  BP Readings from Last 3 Encounters:  12/30/21 (!) 136/96  12/22/21 126/87   12/17/21 127/78   Wt Readings from Last 3 Encounters:  12/30/21 268 lb 3.2 oz (121.7 kg)  12/22/21 275 lb (124.7 kg)  12/15/21 275 lb (124.7 kg)      Physical Exam Constitutional:      General: She is not in acute distress.    Appearance: Normal appearance. She is not ill-appearing, toxic-appearing or diaphoretic.  Cardiovascular:     Rate and Rhythm: Normal rate and regular rhythm.  Pulmonary:     Effort: Pulmonary effort is normal.  Abdominal:     General: Bowel sounds are decreased. There is no distension.     Palpations: Abdomen is soft. There is no shifting dullness, hepatomegaly or mass.     Tenderness: There is no abdominal tenderness.  Neurological:     Mental Status: She is alert.     No results found for any visits on 12/30/21.  Assessment & Plan     Problem List Items Addressed This Visit       Digestive   Cyclical vomiting syndrome not associated with migraine - Primary    Acute on chronic Worsening severity of vomiting and prolonged duration requiring alprazolam and promethazine as well as hospitalization for fluid replacement due to prolonged vomiting  Patient denies cannabis use for the last month We will check CMP today to check electrolyte levels in the setting of frequent vomiting We will also submit referral to gastroenterology for further work-up       Relevant Orders   Comprehensive metabolic panel     Return in about 4 weeks (around 01/27/2022) for Vomiting.     I, Eulis Foster, MD, have reviewed all documentation for this visit. The documentation on 12/30/21 for the exam, diagnosis, procedures, and orders are all accurate and complete.    Eulis Foster, MD  [facility] [phone] (phone) [phone] (fax)  [place]

## 2021-12-30 NOTE — Assessment & Plan Note (Signed)
Acute on chronic Worsening severity of vomiting and prolonged duration requiring alprazolam and promethazine as well as hospitalization for fluid replacement due to prolonged vomiting  Patient denies cannabis use for the last month We will check CMP today to check electrolyte levels in the setting of frequent vomiting We will also submit referral to gastroenterology for further work-up

## 2021-12-31 ENCOUNTER — Encounter: Payer: Self-pay | Admitting: Family Medicine

## 2021-12-31 ENCOUNTER — Other Ambulatory Visit: Payer: Self-pay | Admitting: Family Medicine

## 2021-12-31 LAB — COMPREHENSIVE METABOLIC PANEL
ALT: 30 IU/L (ref 0–32)
AST: 21 IU/L (ref 0–40)
Albumin/Globulin Ratio: 1.4 (ref 1.2–2.2)
Albumin: 4.3 g/dL (ref 3.9–4.9)
Alkaline Phosphatase: 68 IU/L (ref 44–121)
BUN/Creatinine Ratio: 8 — ABNORMAL LOW (ref 9–23)
BUN: 6 mg/dL (ref 6–20)
Bilirubin Total: 0.3 mg/dL (ref 0.0–1.2)
CO2: 20 mmol/L (ref 20–29)
Calcium: 9.4 mg/dL (ref 8.7–10.2)
Chloride: 103 mmol/L (ref 96–106)
Creatinine, Ser: 0.74 mg/dL (ref 0.57–1.00)
Globulin, Total: 3.1 g/dL (ref 1.5–4.5)
Glucose: 87 mg/dL (ref 70–99)
Potassium: 3.5 mmol/L (ref 3.5–5.2)
Sodium: 141 mmol/L (ref 134–144)
Total Protein: 7.4 g/dL (ref 6.0–8.5)
eGFR: 108 mL/min/{1.73_m2} (ref >59)

## 2021-12-31 MED ORDER — ALPRAZOLAM 1 MG PO TABS: 1.5000 mg | ORAL_TABLET | Freq: Two times a day (BID) | ORAL | 1 refills | Status: DC | PRN

## 2022-01-01 ENCOUNTER — Other Ambulatory Visit: Payer: Self-pay | Admitting: Family Medicine

## 2022-01-01 MED ORDER — ALPRAZOLAM 2 MG PO TABS: 2.0000 mg | ORAL_TABLET | Freq: Two times a day (BID) | ORAL | 0 refills | Status: DC | PRN

## 2022-01-01 NOTE — Telephone Encounter (Signed)
In order for patient to pick up prescription for Xanax, she would need to have dosage change and new prescription. She was previously taking '1mg'$  TID. She has been taking it more frequently due to persistent nausea and vomiting as she was prescribed similar medications while hospitalized for the vomiting.   In order to prevent episodes while waiting for prescription on 10/5, will send updated prescription for '2mg'$  twice daily to be filled 01/01/22.   Eulis Foster, MD  [facility]  [phone]

## 2022-01-05 DIAGNOSIS — M47816 Spondylosis without myelopathy or radiculopathy, lumbar region: Secondary | ICD-10-CM | POA: Insufficient documentation

## 2022-01-05 DIAGNOSIS — M5416 Radiculopathy, lumbar region: Secondary | ICD-10-CM | POA: Insufficient documentation

## 2022-01-05 DIAGNOSIS — M545 Low back pain, unspecified: Secondary | ICD-10-CM | POA: Insufficient documentation

## 2022-01-05 DIAGNOSIS — R2231 Localized swelling, mass and lump, right upper limb: Secondary | ICD-10-CM | POA: Diagnosis not present

## 2022-01-05 DIAGNOSIS — M47896 Other spondylosis, lumbar region: Secondary | ICD-10-CM | POA: Diagnosis not present

## 2022-01-08 ENCOUNTER — Telehealth: Payer: Self-pay | Admitting: Nurse Practitioner

## 2022-01-08 ENCOUNTER — Inpatient Hospital Stay: Payer: BC Managed Care – PPO

## 2022-01-08 NOTE — Telephone Encounter (Signed)
pt called in stating that she was just realeased from hospital and they flushed her port there. Did not need appt for today. No Future Port flushes scheduled .Marland KitchenKJ

## 2022-01-09 ENCOUNTER — Other Ambulatory Visit: Payer: Self-pay | Admitting: Family Medicine

## 2022-01-09 NOTE — Telephone Encounter (Signed)
Requested medications are due for refill today.  no  Requested medications are on the active medications list.  no  Last refill. Pt is no longer taking 1 mg dose  Future visit scheduled.   no  Notes to clinic.  Refill not delegated.    Requested Prescriptions  Pending Prescriptions Disp Refills   ALPRAZolam (XANAX) 1 MG tablet [Pharmacy Med Name: ALPRAZOLAM 1 MG TAB] 90 tablet     Sig: TAKE 1 TABLET BY MOUTH THREE TIMES DAILY AS NEEDED     Not Delegated - Psychiatry: Anxiolytics/Hypnotics 2 Failed - 01/09/2022  5:36 PM      Failed - This refill cannot be delegated      Passed - Urine Drug Screen completed in last 360 days      Passed - Patient is not pregnant      Passed - Valid encounter within last 6 months    Recent Outpatient Visits           1 week ago Cyclical vomiting syndrome not associated with migraine   [facility] Simmons-Robinson, [place], MD   2 weeks ago Need for influenza vaccination   [facility] [name], MD   1 month ago LLQ pain   [facility] [place], [place], MD   2 months ago Recurrent major depressive disorder, in partial remission [facility])   [facility] [name], MD   5 months ago Hypothyroidism due to [place] [name], MD

## 2022-01-22 ENCOUNTER — Encounter: Payer: Self-pay | Admitting: Oncology

## 2022-02-07 ENCOUNTER — Other Ambulatory Visit: Payer: Self-pay | Admitting: Obstetrics and Gynecology

## 2022-02-07 DIAGNOSIS — E038 Other specified hypothyroidism: Secondary | ICD-10-CM

## 2022-02-09 ENCOUNTER — Telehealth: Payer: Self-pay | Admitting: Family Medicine

## 2022-02-09 DIAGNOSIS — R1115 Cyclical vomiting syndrome unrelated to migraine: Secondary | ICD-10-CM

## 2022-02-09 NOTE — Telephone Encounter (Signed)
[facility] faxed refill request for the following medications:   ALPRAZolam (XANAX) 1 MG tablet   This strength is not on current medication list  Please advise.

## 2022-02-10 ENCOUNTER — Other Ambulatory Visit: Payer: Self-pay | Admitting: Physician Assistant

## 2022-02-10 DIAGNOSIS — R1115 Cyclical vomiting syndrome unrelated to migraine: Secondary | ICD-10-CM

## 2022-02-10 MED ORDER — ALPRAZOLAM 2 MG PO TABS: 2.0000 mg | ORAL_TABLET | Freq: Two times a day (BID) | ORAL | 0 refills | Status: DC | PRN

## 2022-02-10 NOTE — Telephone Encounter (Signed)
Requested medications are due for refill today.  no  Requested medications are on the active medications list.  yes  Last refill. 02/10/2022#30 0 rf  Future visit scheduled.   no  Notes to clinic.  Pharmacy comment: Please clarify the directions  for this prescription.  Patient getting '1mg'$  and '2mg'$  prescriptions. Please clarify what dose and directions.     Requested Prescriptions  Pending Prescriptions Disp Refills   alprazolam (XANAX) 2 MG tablet [Pharmacy Med Name: ALPRAZolam '2MG'$       TAB] 30 tablet 0    Sig: TAKE 1 TABLET BY MOUTH TWICE DAILY AS NEEDED FOR SLEEP     Not Delegated - Psychiatry: Anxiolytics/Hypnotics 2 Failed - 02/10/2022  5:36 PM      Failed - This refill cannot be delegated      Passed - Urine Drug Screen completed in last 360 days      Passed - Patient is not pregnant      Passed - Valid encounter within last 6 months    Recent Outpatient Visits           1 month ago Cyclical vomiting syndrome not associated with migraine   [facility] Simmons-Robinson, [place], MD   1 month ago Need for influenza vaccination   [facility] [name], MD   2 months ago LLQ pain   [facility] [place], [place], MD   3 months ago Recurrent major depressive disorder, in partial remission [facility])   [facility] [name], MD   6 months ago Hypothyroidism due to [place] [name], MD

## 2022-02-11 ENCOUNTER — Ambulatory Visit: Payer: Medicaid Other | Admitting: Family Medicine

## 2022-02-14 ENCOUNTER — Other Ambulatory Visit: Payer: Self-pay | Admitting: Nurse Practitioner

## 2022-02-23 ENCOUNTER — Other Ambulatory Visit: Payer: Self-pay

## 2022-02-23 ENCOUNTER — Telehealth: Payer: Self-pay | Admitting: Family Medicine

## 2022-02-23 DIAGNOSIS — E038 Other specified hypothyroidism: Secondary | ICD-10-CM

## 2022-02-23 MED ORDER — LEVOTHYROXINE SODIUM 175 MCG PO TABS: 175.0000 ug | ORAL_TABLET | Freq: Every day | ORAL | 3 refills | Status: AC

## 2022-02-23 NOTE — Telephone Encounter (Signed)
[facility] faxed refill request for the following medications:   levothyroxine (SYNTHROID) 200 MCG tablet     Please advise.

## 2022-02-24 ENCOUNTER — Other Ambulatory Visit: Payer: Self-pay | Admitting: Nurse Practitioner

## 2022-03-03 ENCOUNTER — Inpatient Hospital Stay: Payer: BC Managed Care – PPO | Attending: Radiation Oncology

## 2022-03-03 DIAGNOSIS — Z452 Encounter for adjustment and management of vascular access device: Secondary | ICD-10-CM | POA: Diagnosis not present

## 2022-03-03 DIAGNOSIS — C539 Malignant neoplasm of cervix uteri, unspecified: Secondary | ICD-10-CM | POA: Insufficient documentation

## 2022-03-03 DIAGNOSIS — Z95828 Presence of other vascular implants and grafts: Secondary | ICD-10-CM

## 2022-03-03 MED ORDER — HEPARIN SOD (PORK) LOCK FLUSH 100 UNIT/ML IV SOLN
500.0000 [IU] | Freq: Once | INTRAVENOUS | Status: AC
  Administered 2022-03-03: 500 [IU] via INTRAVENOUS
  Filled 2022-03-03: qty 5

## 2022-03-03 MED ORDER — SODIUM CHLORIDE 0.9% FLUSH
10.0000 mL | Freq: Once | INTRAVENOUS | Status: AC
  Administered 2022-03-03: 10 mL via INTRAVENOUS
  Filled 2022-03-03: qty 10

## 2022-04-20 ENCOUNTER — Encounter: Payer: Self-pay | Admitting: Oncology

## 2022-04-27 IMAGING — CT CT CHEST W/ CM
2 of 4 series · 15 of 36 positions shown, 18 images · IV contrast (agent unspecified)
Comparison: Previous studies including CT chest done on 01/01/2020
and PET-CT done on 02/20/2019

CLINICAL DATA: Lung nodule

EXAM:
CT CHEST WITH CONTRAST
TECHNIQUE: Multidetector CT imaging of the chest was performed during
intravenous contrast administration.

[Series 2: axial chest 2.00 · axial · 0.68mm/px · z∈[-1165,-911]mm · 12 of 151 slices shown, 15 images]
[im 12/151  mediastinal]
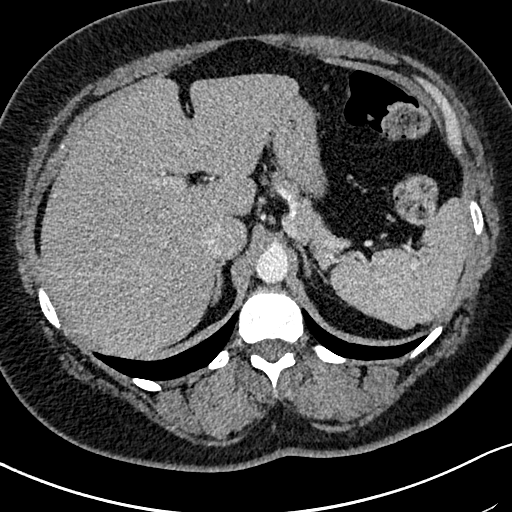
[im 12/151  lung]
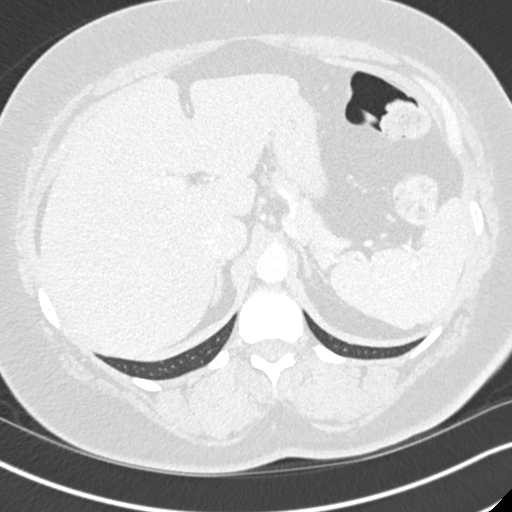
[im 24/151  lung]
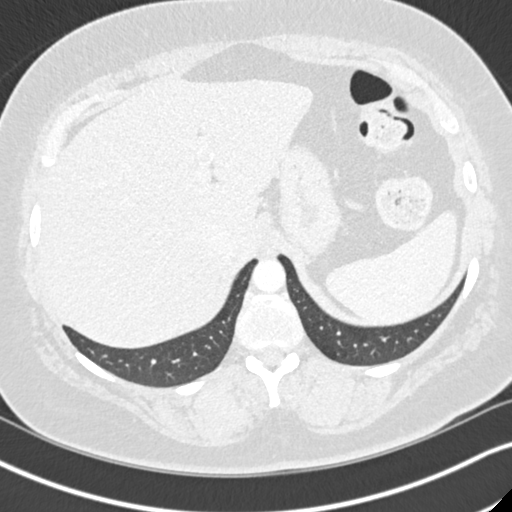
[im 35/151  lung]
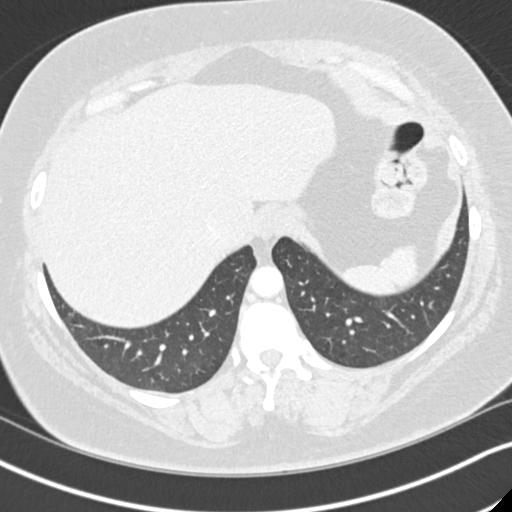
[im 47/151  lung]
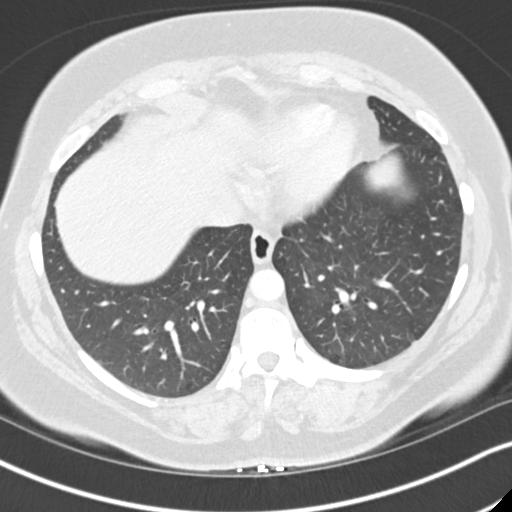
[im 58/151  mediastinal]
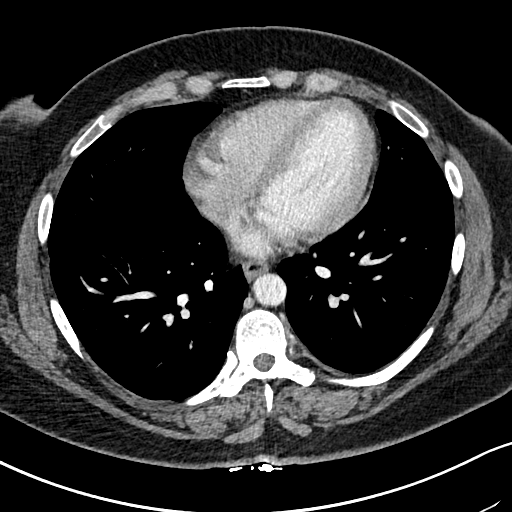
[im 58/151  lung]
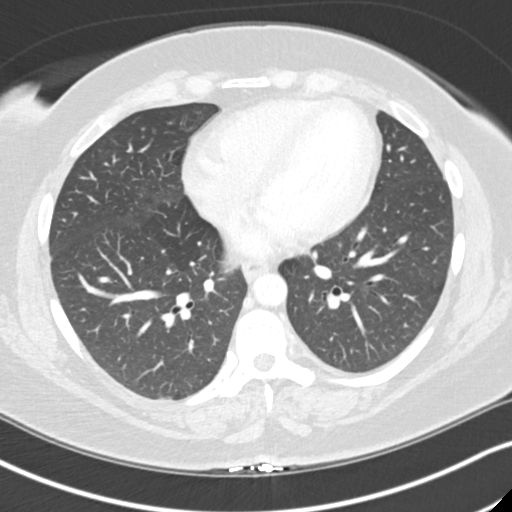
[im 70/151  lung]
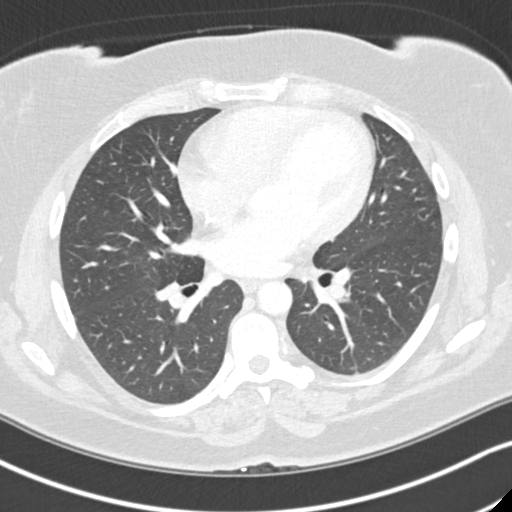
[im 81/151  lung]
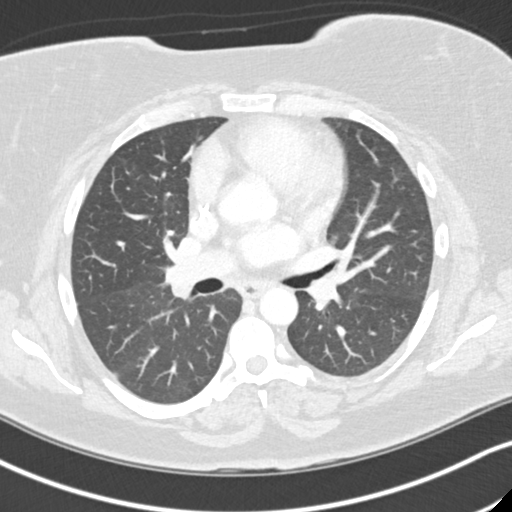
[im 93/151  lung]
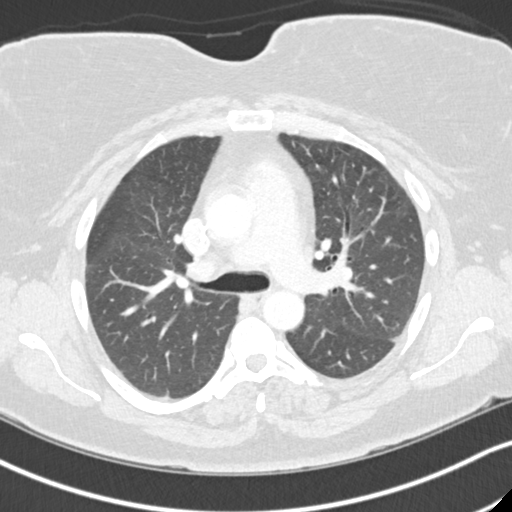
[im 104/151  mediastinal]
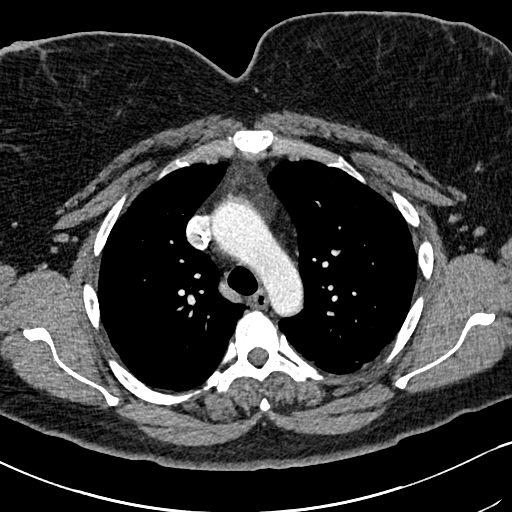
[im 104/151  lung]
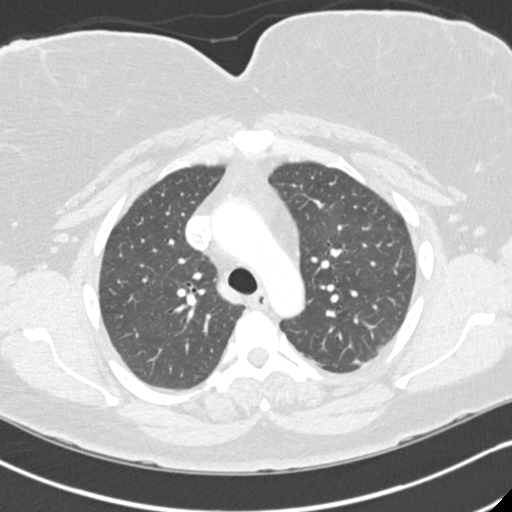
[im 116/151  lung]
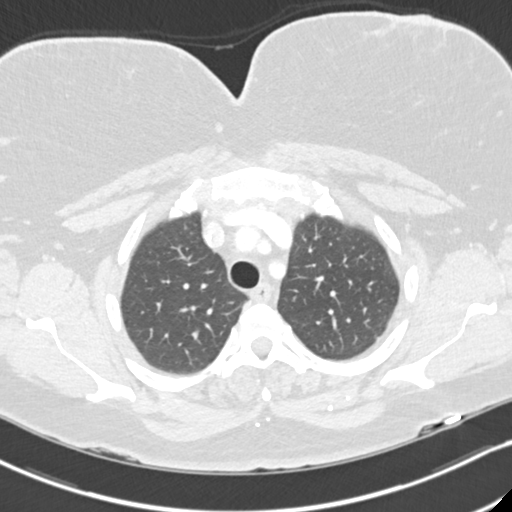
[im 127/151  lung]
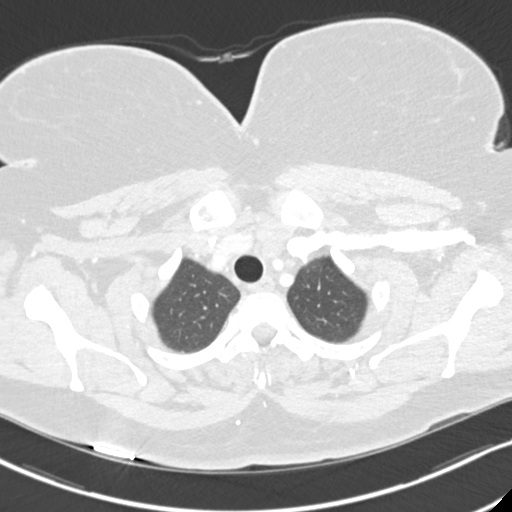
[im 139/151  lung]
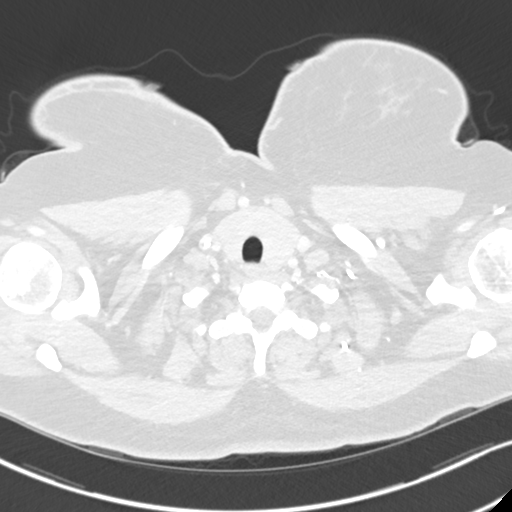

[Series 4: coronal chest 2.00 cor · coronal · 0.59mm/px · 3 of 156 slices shown]
[im 32/156  lung]
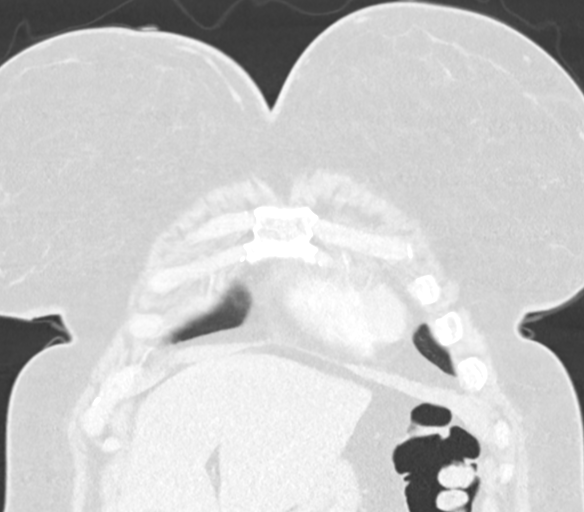
[im 63/156  lung]
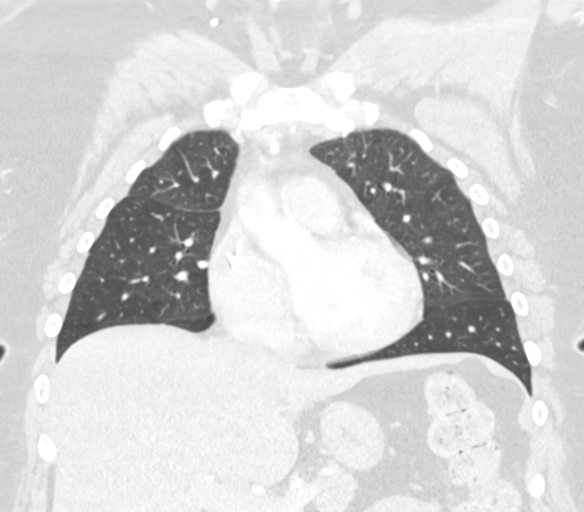
[im 94/156  lung]
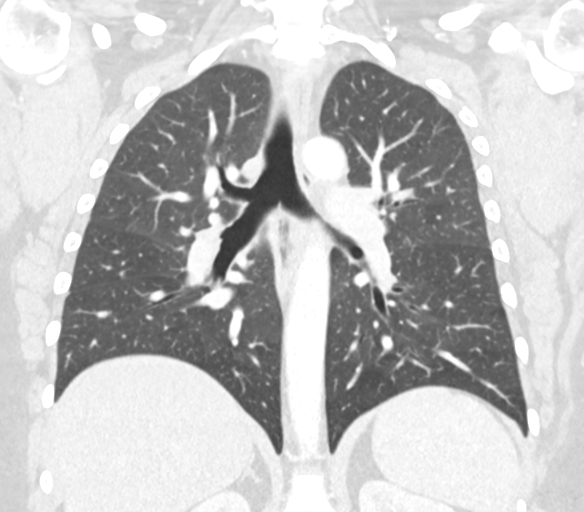

[15 of 36 positions shown; findings below may reference images not displayed]

RADIATION DOSE REDUCTION: This exam was performed according to the
departmental dose-optimization program which includes automated
exposure control, adjustment of the mA and/or kV according to
patient size and/or use of iterative reconstruction technique.

CONTRAST:  75mL OMNIPAQUE IOHEXOL 350 MG/ML SOLN
FINDINGS: Cardiovascular: Unremarkable.

Mediastinum/Nodes: No new significant lymphadenopathy seen. Thyroid
is enlarged with slightly inhomogeneous attenuation.

Lungs/Pleura: There is no focal pulmonary consolidation. There are
few scattered small plaque-like nodules in the pleura each measuring
less than 5 mm. There is interval increase in number of is small
pleural nodules since 01/01/2020. There is no focal pulmonary
consolidation. There is no new parenchymal nodules in the lung
fields. There is no pleural effusion or pneumothorax.

Upper Abdomen: There is fatty infiltration in the liver.

Musculoskeletal: Unremarkable.
IMPRESSION: There are small scattered plaque-like pleural nodules each measuring
less than 5 mm. There is possible increase in number of these
pleural nodules since 01/01/2020. Follow-up CT chest in 6 months may
be considered.

There is no discrete parenchymal nodule in the lung fields. There is
no focal pulmonary consolidation. No new significant lymphadenopathy
seen. There is no pleural effusion.

Enlarged thyroid.  Fatty liver.

## 2022-04-30 ENCOUNTER — Ambulatory Visit: Payer: BC Managed Care – PPO | Admitting: Gastroenterology

## 2022-04-30 ENCOUNTER — Encounter: Payer: Self-pay | Admitting: Gastroenterology

## 2022-04-30 ENCOUNTER — Telehealth: Payer: Self-pay | Admitting: Gastroenterology

## 2022-04-30 NOTE — Telephone Encounter (Signed)
Pt was no show letter was sent through my chart

## 2022-04-30 NOTE — Progress Notes (Deleted)
Gastroenterology Consultation  Referring Provider:     [name],* Primary Care Physician:  Eulis Foster, MD Primary Gastroenterologist:  Dr. Allen Norris     Reason for Consultation:     Nausea and vomiting        HPI:   Kristy Gordon is a 37 y.o. y/o female referred for consultation & management of nausea and vomiting by Dr. Eulis Foster, MD. This patient comes to see me today for reported transfer of care after being seen and evaluated at [facility] which included an upper endoscopy in 2022.  The upper endoscopy showed:  Impression: - Normal mucosa was found in the entire esophagus. - Small hiatal hernia. - Normal stomach. - Normal examined duodenum. - No explanation for symptoms found on procedure.   The patient was in the emergency department and admitted to the hospital back in September for intractable nausea and vomiting.  The patient was reported to have worsening nausea and vomiting in the prior week before coming to the emergency department.  Her symptoms were relieved by warm baths or warm showers.  The patient has a history of ADHD, hypothyroidism, Hashimoto's thyroiditis, cervical cancer, neuropathy, depression, anxiety and chronic nausea and vomiting.  The patient on admission had reported not using THC 1 week prior to admission to the hospital.  During the hospital stay the patient had no acute findings on the CT scan of the abdomen and the patient was negative for any GI pathogens.  She was treated with hydration and antiemetics and her symptoms improved and she was recommended to stop THC use.     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 Authorizing Provider  alprazolam [name]) 2 MG tablet Take 1 tablet (2 mg total) by mouth 2 (two) times daily as needed for sleep. 02/10/22   Ostwalt, Letitia Libra, PA-C  amphetamine-dextroamphetamine (ADDERALL) 10 MG tablet Take 1 tablet (10 mg total) by mouth 2 (two) times daily with a meal. Patient taking differently: Take 10 mg by mouth daily as needed (ADHD). 07/02/20   [name], MD  cyclobenzaprine (FLEXERIL) 5 MG tablet Take 1 tablet (5 mg total) by mouth 3 (three) times daily as needed for muscle spasms. 12/05/21   Simmons-Robinson, [place], MD  Dulaglutide (TRULICITY) [id number] [id number] SOPN Inject 0.75 mg into the skin once a week. 12/22/21   [name], MD  DULoxetine (CYMBALTA) 30 MG capsule Take 1 capsule (30 mg total) by mouth daily. 12/22/21   [name], MD  estradiol (ESTRACE) 1 MG tablet Take 1 tablet by mouth once daily 02/16/22   Verlon Au, NP  gabapentin (NEURONTIN) 300 MG capsule TAKE 1 CAPSULE BY MOUTH THREE TIMES DAILY 09/12/21   Hughie Closs, PA-C  haloperidol (HALDOL) 0.5 MG tablet Take 1 tablet (0.5 mg total) by mouth every 8 (eight) hours as needed (nausea). 12/17/21 12/17/22  Wouk, Ailene Rud, MD  levothyroxine (SYNTHROID) 175 MCG tablet Take 1 tablet (175 mcg total) by mouth daily. 02/23/22   Simmons-Robinson, Riki Sheer, MD  lidocaine-prilocaine (EMLA) cream Apply to affected area once 10/09/21   Sindy Guadeloupe, MD  meloxicam (MOBIC) 7.5 MG tablet Take 1 tablet (7.5 mg total) by mouth daily.  12/05/21   Simmons-Robinson, Riki Sheer, MD  oxybutynin (DITROPAN XL) 15 MG 24 hr tablet TAKE 1 TABLET BY MOUTH ONCE DAILY 02/24/22   Verlon Au, NP  progesterone (PROMETRIUM) 200 MG capsule TAKE ONE CAPSULE BY MOUTH AT BEDTIME WITH FOOD FOR 12 DAYS SEQUENTIALLY PER 28 DAY CYCLE 05/28/21   Verlon Au, NP  promethazine (PHENERGAN) 25 MG tablet Take 1 tablet (25 mg total) by mouth every 12 (twelve) hours as needed for nausea or vomiting. 12/30/21   Simmons-Robinson, Riki Sheer, MD    Family History  Adopted: Yes  Family history unknown: Yes     Social History   Tobacco Use   Smoking status: Some Days    Packs/day: 0.15    Years: 10.00    Total pack years: 1.50    Types: Cigarettes   Smokeless tobacco: Never   Tobacco comments:    2 weeks to use a pack  Vaping Use   Vaping Use: Some days   Substances: Mixture of cannabinoids   Devices: marijuana vape  Substance Use Topics   Alcohol use: Yes    Comment: rare   Drug use: Yes    Types: Marijuana    Comment: quit opiates 5 years ago    Allergies as of 04/30/2022 - Review Complete 12/30/2021  Allergen Reaction Noted   Amoxicillin Rash 09/05/2015   Penicillins Hives 09/05/2015    Review of Systems:    All systems reviewed and negative except where noted in HPI.   Physical Exam:  LMP  (LMP Unknown)  No LMP recorded (lmp unknown). Patient is postmenopausal. General:   Alert,  Well-developed, ***well-nourished, pleasant and cooperative in NAD Head:  Normocephalic and atraumatic. Eyes:  Sclera clear, no icterus.   Conjunctiva pink. Ears:  Normal auditory acuity. Neck:  Supple; no masses or thyromegaly. Lungs:  Respirations even and unlabored.  Clear throughout to auscultation.   No wheezes, crackles, or rhonchi. No acute distress. Heart:  Regular rate and rhythm; no murmurs, clicks, rubs, or gallops. Abdomen:  Normal bowel sounds.  No bruits.  Soft, non-tender and non-distended without masses, hepatosplenomegaly or hernias  noted.  No guarding or rebound tenderness.  ***Negative Carnett sign.   Rectal:  Deferred.***  Pulses:  Normal pulses noted. Extremities:  No clubbing or edema.  No cyanosis. Neurologic:  Alert and oriented x3;  grossly normal neurologically. Skin:  Intact without significant lesions or rashes.  ***No jaundice. Lymph Nodes:  No significant cervical adenopathy. Psych:  Alert and cooperative. Normal mood and affect.  Imaging Studies: No results found.  Assessment and Plan:   Kristy Gordon is a 37 y.o. y/o female ***    Lucilla Lame, MD. Marval Regal    Note: This dictation was prepared with Dragon dictation along with smaller phrase technology. Any transcriptional errors that result from this process are unintentional.

## 2022-05-01 ENCOUNTER — Encounter: Payer: Self-pay | Admitting: Family Medicine

## 2022-05-01 DIAGNOSIS — R1115 Cyclical vomiting syndrome unrelated to migraine: Secondary | ICD-10-CM

## 2022-05-04 ENCOUNTER — Inpatient Hospital Stay: Payer: Medicaid Other | Attending: Radiation Oncology

## 2022-05-04 DIAGNOSIS — Z452 Encounter for adjustment and management of vascular access device: Secondary | ICD-10-CM | POA: Insufficient documentation

## 2022-05-04 DIAGNOSIS — C539 Malignant neoplasm of cervix uteri, unspecified: Secondary | ICD-10-CM | POA: Insufficient documentation

## 2022-05-04 DIAGNOSIS — Z95828 Presence of other vascular implants and grafts: Secondary | ICD-10-CM

## 2022-05-04 MED ORDER — SODIUM CHLORIDE 0.9% FLUSH
10.0000 mL | Freq: Once | INTRAVENOUS | Status: AC
  Administered 2022-05-04: 10 mL via INTRAVENOUS
  Filled 2022-05-04: qty 10

## 2022-05-04 MED ORDER — ALPRAZOLAM 1 MG PO TABS: 1.0000 mg | ORAL_TABLET | Freq: Three times a day (TID) | ORAL | 0 refills | Status: DC

## 2022-05-04 MED ORDER — HEPARIN SOD (PORK) LOCK FLUSH 100 UNIT/ML IV SOLN
500.0000 [IU] | Freq: Once | INTRAVENOUS | Status: AC
  Administered 2022-05-04: 500 [IU] via INTRAVENOUS
  Filled 2022-05-04: qty 5

## 2022-05-05 ENCOUNTER — Other Ambulatory Visit: Payer: Self-pay | Admitting: Nurse Practitioner

## 2022-05-12 ENCOUNTER — Other Ambulatory Visit: Payer: Self-pay

## 2022-05-12 DIAGNOSIS — C539 Malignant neoplasm of cervix uteri, unspecified: Secondary | ICD-10-CM

## 2022-05-13 ENCOUNTER — Inpatient Hospital Stay (HOSPITAL_BASED_OUTPATIENT_CLINIC_OR_DEPARTMENT_OTHER): Payer: Medicaid Other | Admitting: Oncology

## 2022-05-13 ENCOUNTER — Encounter: Payer: Self-pay | Admitting: Oncology

## 2022-05-13 ENCOUNTER — Inpatient Hospital Stay: Payer: Medicaid Other | Attending: Radiation Oncology

## 2022-05-13 VITALS — BP 146/102 | HR 88 | Temp 97.6°F | Resp 18 | Wt 286.0 lb

## 2022-05-13 DIAGNOSIS — Y842 Radiological procedure and radiotherapy as the cause of abnormal reaction of the patient, or of later complication, without mention of misadventure at the time of the procedure: Secondary | ICD-10-CM | POA: Insufficient documentation

## 2022-05-13 DIAGNOSIS — N3041 Irradiation cystitis with hematuria: Secondary | ICD-10-CM | POA: Diagnosis not present

## 2022-05-13 DIAGNOSIS — Z8541 Personal history of malignant neoplasm of cervix uteri: Secondary | ICD-10-CM

## 2022-05-13 DIAGNOSIS — Z923 Personal history of irradiation: Secondary | ICD-10-CM | POA: Insufficient documentation

## 2022-05-13 DIAGNOSIS — Z08 Encounter for follow-up examination after completed treatment for malignant neoplasm: Secondary | ICD-10-CM | POA: Diagnosis not present

## 2022-05-13 DIAGNOSIS — Z9221 Personal history of antineoplastic chemotherapy: Secondary | ICD-10-CM | POA: Diagnosis not present

## 2022-05-13 DIAGNOSIS — N926 Irregular menstruation, unspecified: Secondary | ICD-10-CM | POA: Insufficient documentation

## 2022-05-13 DIAGNOSIS — C539 Malignant neoplasm of cervix uteri, unspecified: Secondary | ICD-10-CM

## 2022-05-13 LAB — COMPREHENSIVE METABOLIC PANEL
ALT: 19 U/L (ref 0–44)
AST: 23 U/L (ref 15–41)
Albumin: 4.2 g/dL (ref 3.5–5.0)
Alkaline Phosphatase: 69 U/L (ref 38–126)
Anion gap: 10 (ref 5–15)
BUN: 14 mg/dL (ref 6–20)
CO2: 22 mmol/L (ref 22–32)
Calcium: 8.8 mg/dL — ABNORMAL LOW (ref 8.9–10.3)
Chloride: 105 mmol/L (ref 98–111)
Creatinine, Ser: 0.8 mg/dL (ref 0.44–1.00)
GFR, Estimated: >60 mL/min (ref >60)
Glucose, Bld: 103 mg/dL — ABNORMAL HIGH (ref 70–99)
Potassium: 3.9 mmol/L (ref 3.5–5.1)
Sodium: 137 mmol/L (ref 135–145)
Total Bilirubin: 0.4 mg/dL (ref 0.3–1.2)
Total Protein: 8 g/dL (ref 6.5–8.1)

## 2022-05-13 LAB — CBC WITH DIFFERENTIAL/PLATELET
Abs Immature Granulocytes: 0.01 10*3/uL (ref 0.00–0.07)
Basophils Absolute: 0.1 10*3/uL (ref 0.0–0.1)
Basophils Relative: 1 %
Eosinophils Absolute: 0.1 10*3/uL (ref 0.0–0.5)
Eosinophils Relative: 1 %
HCT: 43.1 % (ref 36.0–46.0)
Hemoglobin: 14.7 g/dL (ref 12.0–15.0)
Immature Granulocytes: 0 %
Lymphocytes Relative: 37 %
Lymphs Abs: 2.8 10*3/uL (ref 0.7–4.0)
MCH: 30.8 pg (ref 26.0–34.0)
MCHC: 34.1 g/dL (ref 30.0–36.0)
MCV: 90.4 fL (ref 80.0–100.0)
Monocytes Absolute: 0.6 10*3/uL (ref 0.1–1.0)
Monocytes Relative: 8 %
Neutro Abs: 3.9 10*3/uL (ref 1.7–7.7)
Neutrophils Relative %: 53 %
Platelets: 433 10*3/uL — ABNORMAL HIGH (ref 150–400)
RBC: 4.77 MIL/uL (ref 3.87–5.11)
RDW: 12.6 % (ref 11.5–15.5)
WBC: 7.4 10*3/uL (ref 4.0–10.5)
nRBC: 0 % (ref 0.0–0.2)

## 2022-05-13 NOTE — Progress Notes (Signed)
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 Positive pelvic LN s/p concurrent chemoradiation currently in remission    Chief complaint/ Reason for visit-routine follow-up of cervical cancer  Heme/Onc history:  patient is a 37 year old female G65 who initially presented to the ER on 03/15/2018 with symptoms of abdominal pain and cramping.  She has also been having irregular menstrual bleeding and spotting on and off for the last few months.She had an ultrasound pelvis done in the ER which showed a large 5.8 cm hypoechoic vascular mass within the cervix and the lower uterine segment.  Patient had last seen GYN in 2013 and had not had a Pap smear done since then.  Patient was seen by GYN Dr. Nechama Guard on 1213 and was found to have firm friable cervix and multiple biopsies were taken which showed adenocarcinoma.  She was then seen by Dr. Fransisca Connors from GYN oncology.  Pelvic exam showed anteverted cervix that was replaced by tumor and extension of cancer into the left parametrium and possibly to the pelvic sidewall.    PET CT scan on 03/28/2018 showed 6 cm hypermetabolic cervical mass consistent with primary cervical carcinoma.  Mild hypermetabolic bilateral parametrial right perirectal bilateral iliac lymph nodes consistent with metastatic disease.  No evidence of metastatic disease within the abdomen chest or neck.   Cycle  1 of weekly cisplatin and radiation started on 04/11/2018.  She completed chemoradiation on 05/19/2018 followed by vaginal brachii therapy.  PET CT scan thereafter did not show any evidence of metastatic disease and interval resolution of hypermetabolic pelvic adenopathy and no hypermetabolism seen in the area of the cervix.   Patient also follows up with urology for issues of pelvic pain urinary frequency.  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  She denies any significant neuropathy at this time and has come off both gabapentin and Cymbalta.  She was admitted to the hospital in September 2023 for a bout of abdominal pain and nausea vomiting secondary to colitis  ECOG PS- 0 Pain scale- 0   Review of systems- Review of Systems  Constitutional:  Negative for chills, fever, malaise/fatigue and weight loss.  HENT:  Negative for congestion, ear discharge and nosebleeds.   Eyes:  Negative for blurred vision.  Respiratory:  Negative for cough, hemoptysis, sputum production, shortness of breath and wheezing.   Cardiovascular:  Negative for chest pain, palpitations, orthopnea and claudication.  Gastrointestinal:  Negative for abdominal pain, blood in stool, constipation, diarrhea, heartburn, melena, nausea and vomiting.  Genitourinary:  Negative for dysuria, flank pain, frequency, hematuria and urgency.  Musculoskeletal:  Negative for back pain, joint pain and myalgias.  Skin:  Negative for rash.  Neurological:  Negative for dizziness, tingling, focal weakness, seizures, weakness and headaches.  Endo/Heme/Allergies:  Does not bruise/bleed easily.  Psychiatric/Behavioral:  Negative for depression and suicidal ideas. The patient does not have insomnia.        Allergies  Allergen Reactions   Amoxicillin Rash    Did it involve swelling of the face/tongue/throat, SOB, or low BP? No Did it involve sudden or severe rash/hives, skin peeling, or any reaction on the inside of your mouth or nose? No Did you need to seek medical attention at a hospital or doctor's office? No When did it last happen?  Childhood     If all above answers are "NO", may proceed with cephalosporin use.    Penicillins Hives    Did it involve swelling of the face/tongue/throat, SOB, or low BP? No Did it involve sudden or severe rash/hives, skin peeling, or any reaction on the inside of your mouth or nose? No Did you need to seek medical attention at a hospital or doctor's office? No When did it last happen?  Childhood     If all above answers are "NO", may proceed with cephalosporin use.      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   History of cervical cancer    Hypothyroid    Low back pain      Past Surgical History:  Procedure Laterality Date   BUNIONECTOMY Right    PORTA CATH INSERTION N/A 03/31/2018   Procedure: PORTA CATH INSERTION;  Surgeon: Algernon Huxley, MD;  Location: [facility];  Service: Cardiovascular;  Laterality: N/A;   TONSILLECTOMY     TRANSURETHRAL RESECTION OF BLADDER TUMOR N/A 06/20/2019   Procedure: TRANSURETHRAL RESECTION OF BLADDER TUMOR (TURBT);  Surgeon: [name], MD;  Location: [facility];  Service: Urology;  Laterality: N/A;    Social History   Socioeconomic History   Marital status: Married    Spouse name: Not on file   Number of children: Not on file   Years of education: Not on file   Highest education level: Not on file  Occupational History   Not on file  Tobacco Use   Smoking status: Some Days    Packs/day: 0.15    Years: 10.00    Total pack years: 1.50    Types: Cigarettes   Smokeless  tobacco: Never   Tobacco comments:    2 weeks to use a pack  Vaping Use   Vaping Use: Some days   Substances: Mixture of cannabinoids   Devices: marijuana vape  Substance and Sexual Activity   Alcohol use: Yes    Comment: rare   Drug use: Yes    Types: Marijuana    Comment: quit opiates 5 years ago   Sexual activity: Yes    Birth control/protection: None  Other Topics Concern   Not on file  Social History Narrative   Not on file   Social Determinants of Health   Financial Resource Strain: Not on file  Food Insecurity: Not on file  Transportation Needs: Not on file  Physical Activity: Not on file  Stress: Not on file  Social Connections: Not on file  Intimate Partner Violence: Not on file    Family History  Adopted: Yes  Family history unknown: Yes     Current Outpatient Medications:    ALPRAZolam (XANAX) 1 MG tablet, Take 1 tablet (1 mg total) by mouth 3 (three) times  daily., Disp: 90 tablet, Rfl: 0   amphetamine-dextroamphetamine (ADDERALL) 10 MG tablet, Take 1 tablet (10 mg total) by mouth 2 (two) times daily with a meal. (Patient taking differently: Take 10 mg by mouth daily as needed (ADHD).), Disp: 60 tablet, Rfl: 0   cyclobenzaprine (FLEXERIL) 5 MG tablet, Take 1 tablet (5 mg total) by mouth 3 (three) times daily as needed for muscle spasms., Disp: 30 tablet, Rfl: 1   Dulaglutide (TRULICITY) 8.11 BJ/4.7WG SOPN, Inject 0.75 mg into the skin once a week., Disp: 8 mL, Rfl: 5   DULoxetine (CYMBALTA) 30 MG capsule, Take 1 capsule (30 mg total) by mouth daily., Disp: 90 capsule, Rfl: 3   estradiol (ESTRACE) 1 MG tablet, Take 1 tablet by mouth once daily, Disp: 90 tablet, Rfl: 0   gabapentin (NEURONTIN) 300 MG capsule, TAKE 1 CAPSULE BY MOUTH THREE TIMES DAILY, Disp: 90 capsule, Rfl: 0   haloperidol (HALDOL) 0.5 MG tablet, Take 1 tablet (0.5 mg total) by mouth every 8 (eight) hours as needed (nausea)., Disp: 6 tablet, Rfl: 0   levothyroxine (SYNTHROID) 175 MCG tablet,  Take 1 tablet (175 mcg total) by mouth daily., Disp: 90 tablet, Rfl: 3   lidocaine-prilocaine (EMLA) cream, Apply to affected area once, Disp: 30 g, Rfl: 3   meloxicam (MOBIC) 7.5 MG tablet, Take 1 tablet (7.5 mg total) by mouth daily., Disp: 30 tablet, Rfl: 0   oxybutynin (DITROPAN XL) 15 MG 24 hr tablet, TAKE 1 TABLET BY MOUTH ONCE DAILY, Disp: 90 tablet, Rfl: 3   progesterone (PROMETRIUM) 200 MG capsule, TAKE ONE CAPSULE BY MOUTH AT BEDTIME WITH FOOD FOR 12 DAYS SEQUENTIALLY PER 28 DAY CYCLE, Disp: 36 capsule, Rfl: 3   promethazine (PHENERGAN) 25 MG tablet, Take 1 tablet (25 mg total) by mouth every 12 (twelve) hours as needed for nausea or vomiting., Disp: 90 tablet, Rfl: 1   Semaglutide,0.25 or 0.'5MG'$ /DOS, (OZEMPIC, 0.25 OR 0.5 MG/DOSE,) 2 MG/1.5ML SOPN, Inject into the skin., Disp: , Rfl:  No current facility-administered medications for this visit.  Facility-Administered Medications Ordered in Other Visits:    sodium chloride flush (NS) 0.9 % injection 10 mL, 10 mL, Intravenous, PRN, Sindy Guadeloupe, MD, 10 mL at 04/15/18 0825  Physical exam:  Vitals:   05/13/22 0931  BP: (!) 146/102  Pulse: 88  Resp: 18  Temp: 97.6 F (36.4 C)  TempSrc: Tympanic  SpO2: 97%  Weight: 286 lb (129.7 kg)   Physical Exam Cardiovascular:     Rate and Rhythm: Normal rate and regular rhythm.     Heart sounds: Normal heart sounds.  Pulmonary:     Effort: Pulmonary effort is normal.     Breath sounds: Normal breath sounds.  Abdominal:     General: Bowel sounds are normal.     Palpations: Abdomen is soft.  Skin:    General: Skin is warm and dry.  Neurological:     Mental Status: She is alert and oriented to person, place, and time.         Latest Ref Rng & Units 05/13/2022    9:14 AM  CMP  Glucose 70 - 99 mg/dL 103   BUN 6 - 20 mg/dL 14   Creatinine 0.44 - 1.00 mg/dL 0.80   Sodium 135 - 145 mmol/L 137   Potassium 3.5 - 5.1 mmol/L 3.9   Chloride 98 - 111 mmol/L 105   CO2 22 - 32 mmol/L 22    Calcium 8.9 - 10.3 mg/dL 8.8   Total  Protein 6.5 - 8.1 g/dL 8.0   Total Bilirubin 0.3 - 1.2 mg/dL 0.4   Alkaline Phos 38 - 126 U/L 69   AST 15 - 41 U/L 23   ALT 0 - 44 U/L 19       Latest Ref Rng & Units 05/13/2022    9:14 AM  CBC  WBC 4.0 - 10.5 K/uL 7.4   Hemoglobin 12.0 - 15.0 g/dL 14.7   Hematocrit 36.0 - 46.0 % 43.1   Platelets 150 - 400 K/uL 433      Assessment and plan- Patient is a 37 y.o. female with adenocarcinoma of the cervix stage IIIc T2 N1 M0 s/p concurrent chemoradiation currently in remission.  This is a routine follow-up visit for cervical cancer  Clinically patient is doing well with no concerning moles or recurrence based on today's exam.  She had a CT chest abdomen pelvis with contrast in September 2023 which also did not show any evidence of recurrent disease.  She will be seeing GYN oncology later this month.  I will see her back in 6 months with CBC with differential and CMP   Visit Diagnosis 1. Encounter for follow-up surveillance of cervical cancer      Dr. Randa Evens, MD, MPH [facility] at [facility] [phone] 05/13/2022 12:30 PM

## 2022-05-13 NOTE — Progress Notes (Signed)
Patient does not have any concerns today.

## 2022-05-25 ENCOUNTER — Other Ambulatory Visit: Payer: Self-pay | Admitting: Nurse Practitioner

## 2022-05-26 MED ORDER — ESTRADIOL 1 MG PO TABS: 1.0000 mg | ORAL_TABLET | Freq: Every day | ORAL | 0 refills | Status: DC

## 2022-06-03 ENCOUNTER — Inpatient Hospital Stay (HOSPITAL_BASED_OUTPATIENT_CLINIC_OR_DEPARTMENT_OTHER): Payer: Medicaid Other | Admitting: Obstetrics and Gynecology

## 2022-06-03 VITALS — BP 115/113 | HR 87 | Temp 97.8°F | Resp 20 | Wt 283.1 lb

## 2022-06-03 DIAGNOSIS — Z8541 Personal history of malignant neoplasm of cervix uteri: Secondary | ICD-10-CM

## 2022-06-03 DIAGNOSIS — C539 Malignant neoplasm of cervix uteri, unspecified: Secondary | ICD-10-CM

## 2022-06-03 DIAGNOSIS — Z08 Encounter for follow-up examination after completed treatment for malignant neoplasm: Secondary | ICD-10-CM | POA: Diagnosis not present

## 2022-06-03 NOTE — Progress Notes (Signed)
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 Positive pelvic LN  Subjective:  Kristy Gordon is a 37 y.o. G9 female, initially seen in consultation from Dr. Gilman Schmidt for cervical adenocarcinoma, returns to clinic for continued surveillance.   Interval history Saw Dr Janese Banks 3 weeks ago, "patient is on hormone replacement therapy for hot flashes which is working to a good extent although she still has intermittent hot flashes but not significantly affecting her quality of life. She denies any significant neuropathy at this time and has come off both gabapentin and Cymbalta. She was admitted to the hospital in September 2023 for a bout of abdominal pain and nausea vomiting secondary to colitis."   CT scan 9/23 IMPRESSION: 1. The colon is generally decompressed, however there is a mildly thickened, inflamed appearance throughout, for example in the transverse colon and in the descending colon. Appearance suggests nonspecific infectious or inflammatory colitis. Correlate with referable signs and symptoms. 2. No CT evidence of lymphadenopathy or metastatic disease in the chest, abdomen, or pelvis. 3. Small hiatal hernia.    02/01/2020 Pap NILM/HRHPV negative  02/20/2019 PET IMPRESSION: 1. Two small subpleural pulmonary nodules are not seen on prior. Recommend close attention on follow-up. 2. No hypermetabolic uterine cervical tissue to suggest local recurrence. 3. No hypermetabolic pelvic or retroperitoneal lymph nodes to suggest metastatic disease recurrence. 4. Diffuse hypermetabolic thyroid gland activity consistent with thyroiditis.  06/09/2019 CT for hematuria evaluation 1. No CT findings of the abdomen or pelvis to explain left-sided flank pain or hematuria. No evidence of urinary tract calculus or hydronephrosis. No urinary tract filling defects on delayed phase imaging. 2. No CT abnormality  of the cervix or pelvis given history of malignancy. 3.  Hepatomegaly and hepatic steatosis.  06/20/2019 she underwent transurethral bladder biopsy and pathology c/w radiation cystitis.   08/25/2019 she was seen by Dr. Janese Banks who is treating hypokalemia and non-intractable vomiting with nausea, uncertain etiology.   With regard to radiation cystitis she had been treated with oxybutynin (DITROPAN XL) 15 MG 24 hr tablet and at one time also Myrbetriq, but no significant relief. She has gross small amount hematuria a few times a week, burning with urination, bladder spasms, and frequency. Evaluation as noted above. She has had multiple urine cultures and results have not shown definitive infection. No other procedures or treatments have been performed. She has not had hyperbaric therapy. Per her report her Urologist recommended referral to [facility]. She has been able to keep working and she oversees a Pharmacist, community.   Gyn-Oncology History:  Kristy Gordon is a pleasant G33 female, initially seen in consultation from Dr. Gilman Schmidt for cervical cancer. Please refer to prior notes for complete details.  Seen by Dr Gilman Schmidt 12/13 in office and and cervix firm and friable.  Multiple biopsies showed adenocarcinoma. PAP pending. States today that she has pain in left pelvis.   03/23/2018 HIV Screen 4th Generation wRfx Non Reactive Non Reactive     She was seen by Dr. Fransisca Connors.  Pelvic exam showed anteverted cervix that was replaced by tumor and extension of cancer into the left parametrium and possibly to the pelvic sidewall. We discussed that preservation of fertility is not possible with radiation treatment. She lost several pregnancies and does not have any children, but no longer desires fertility and husband confirmed this. So this is not an issue.  PET CT scan on 03/28/2018 showed 6 cm hypermetabolic  cervical mass consistent with primary cervical carcinoma.  Mild hypermetabolic bilateral parametrial right perirectal  bilateral iliac lymph nodes consistent with metastatic disease.  No evidence of metastatic disease within the abdomen chest or neck.  She received concurrent cisplatin & radiation 04/11/2018- 05/19/2018 (6 cycles) followed by vaginal brachytherapy at [facility] completed 06/07/2018.   PET - 08/31/2018 for restaging IMPRESSION: 1. Clear interval response to therapy. Cervix has decreased substantially in size in the interval with no substantial hypermetabolism today although SUV measurement is difficult due to misregistration with adjacent radiolabeled urine in the bladder. 2. Interval resolution of the hypermetabolic pelvic lymphadenopathy seen previously. 3. No new or progressive findings on today's study.   Restaging pet showed no substantial hypermetabolism of cervix and resolution of hypermetabolic pelvic lymphadenopathy. Since completion of radiation she has complained of left lower quadrant pain. She has been using vaginal dilators and has been receiving pelvic floor PT which somewhat improved her pain though has missed several appointments.   CT in 11/22/2018 for vaginal bleeding and LLQ pain demonstrated  1. Unchanged post treatment appearance of the cervix and pelvis, with no change in tiny pelvic sidewall and iliac lymph nodes, previously enlarged and hypermetabolic. 2. No evidence of metastatic disease in the chest, abdomen, or pelvis. 3. No specific findings in the abdomen or pelvis to explain left lower quadrant pain. Large burden of stool in the colon. 4. Soft tissue in the anterior mediastinum, consistent with thymus and likely reflecting thymic rebound in the setting of recent chemotherapy.  12/02/2018 She saw Dr. Baruch Gouty and recommendation for start dilator therapy.   She has gone to Pelvic floor PT for 6-8 sessions.  She has had issues with vasomotor symptoms and treated with Vivelle-Dot 0.075 mg/24 hr with progesterone 200 mg 12 days of month. Due to continued symptoms TFTs were obtained  and TSH was elevated and they recommended Increase levothyroxine from 125 to 175 mcg daily.   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   Bladder spasms 02/27/2019   Microscopic hematuria 02/27/2019   Vaginal pain 11/18/2018   Premature menopause on hormone replacement therapy 10/20/2018   Adjustment disorder with mixed anxiety and depressed mood 10/20/2018   Goiter 05/20/2018   Hypothyroidism 05/20/2018   Tobacco use 05/20/2018   Cervical cancer ([place]) 05/11/2018   Neuropathy 05/07/2018   Nausea without vomiting 04/25/2018   Iron deficiency anemia 04/11/2018   Goals of care, counseling/discussion 03/28/2018   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[place]) 03/23/2018   Hypothyroidism due to Hashimoto's thyroiditis 03/18/2018   H/O fetal demise, not currently pregnant 03/18/2018   Lower abdominal pain 01/04/2018   Anxiety 09/05/2015   Clinical depression 09/05/2015   Big thyroid 09/05/2015   Cannot sleep 09/05/2015   Adiposity 09/05/2015   Nondependent opioid abuse in remission ([place]) 09/05/2015   Disorder of thyroid 09/05/2015   Tobacco use disorder 09/05/2015    Past Medical History: Past Medical History:  Diagnosis Date   Anxiety    Cervical cancer ([place]) 03/2018   Hashimoto's  thyroiditis    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   History of cervical cancer    Hypothyroid    Low back pain     Past Surgical History: Past Surgical History:  Procedure Laterality Date   BUNIONECTOMY Right    PORTA CATH  INSERTION N/A 03/31/2018   Procedure: PORTA CATH INSERTION;  Surgeon: Algernon Huxley, MD;  Location: [place] [facility];  Service: Cardiovascular;  Laterality: N/A;   TONSILLECTOMY     TRANSURETHRAL RESECTION OF BLADDER TUMOR N/A 06/20/2019   Procedure: TRANSURETHRAL RESECTION OF BLADDER TUMOR (TURBT);  Surgeon: [name], MD;  Location: [facility];  Service: Urology;  Laterality: N/A;    OB History:  OB History  Gravida Para Term Preterm AB Living  '10 2   2 8 '$ 0  SAB IAB Ectopic Multiple Live Births  8       0    # Outcome Date GA Lbr Len/2nd Weight Sex Delivery Anes PTL Lv  10 SAB           9 SAB           8 SAB           7 SAB           6 SAB           5 SAB           4 SAB           3 SAB           2 Preterm      Vag-Spont   FD  1 Preterm      Vag-Spont   FD    Family History: Family History  Adopted: Yes  Family history unknown: Yes    Social History: Social History   Socioeconomic History   Marital status: Married    Spouse name: Not on file   Number of children: Not on file   Years of education: Not on file   Highest education level: Not on file  Occupational History   Not on file  Tobacco Use   Smoking status: Some Days    Packs/day: 0.15    Years: 10.00    Total pack years: 1.50    Types: Cigarettes   Smokeless tobacco: Never   Tobacco comments:    2 weeks to use a pack  Vaping Use   Vaping Use: Some days   Substances: Mixture of cannabinoids   Devices: marijuana vape  Substance and Sexual Activity   Alcohol use: Yes    Comment: rare   Drug use: Yes    Types: Marijuana    Comment: quit opiates 5 years ago   Sexual activity: Yes    Birth control/protection: None  Other Topics Concern   Not on file  Social History Narrative   Not on file   Social Determinants of Health   Financial Resource Strain: Not on file  Food Insecurity: Not on file  Transportation Needs: Not on file  Physical Activity: Not on file  Stress: Not on file   Social Connections: Not on file  Intimate Partner Violence: Not on file    Allergies: Allergies  Allergen Reactions   Amoxicillin Rash    Did it involve swelling of the face/tongue/throat, SOB, or low BP? No Did it involve sudden or severe rash/hives, skin peeling, or any reaction on the inside of your mouth or nose? No Did you need to  seek medical attention at a hospital or doctor's office? No When did it last happen?  Childhood     If all above answers are "NO", may proceed with cephalosporin use.    Penicillins Hives    Did it involve swelling of the face/tongue/throat, SOB, or low BP? No Did it involve sudden or severe rash/hives, skin peeling, or any reaction on the inside of your mouth or nose? No Did you need to seek medical attention at a hospital or doctor's office? No When did it last happen?  Childhood     If all above answers are "NO", may proceed with cephalosporin use.     Current Medications: Current Outpatient Medications  Medication Sig Dispense Refill   ALPRAZolam (XANAX) 1 MG tablet Take 1 tablet (1 mg total) by mouth 3 (three) times daily. 90 tablet 0   amphetamine-dextroamphetamine (ADDERALL) 10 MG tablet Take 1 tablet (10 mg total) by mouth 2 (two) times daily with a meal. 60 tablet 0   cyclobenzaprine (FLEXERIL) 5 MG tablet Take 1 tablet (5 mg total) by mouth 3 (three) times daily as needed for muscle spasms. 30 tablet 1   Dulaglutide (TRULICITY) A999333 0000000 SOPN Inject 0.75 mg into the skin once a week. 8 mL 5   DULoxetine (CYMBALTA) 30 MG capsule Take 1 capsule (30 mg total) by mouth daily. 90 capsule 3   estradiol (ESTRACE) 1 MG tablet Take 1 tablet (1 mg total) by mouth daily. 90 tablet 0   gabapentin (NEURONTIN) 300 MG capsule TAKE 1 CAPSULE BY MOUTH THREE TIMES DAILY 90 capsule 0   haloperidol (HALDOL) 0.5 MG tablet Take 1 tablet (0.5 mg total) by mouth every 8 (eight) hours as needed (nausea). 6 tablet 0   levothyroxine (SYNTHROID) 175 MCG tablet  Take 1 tablet (175 mcg total) by mouth daily. 90 tablet 3   lidocaine-prilocaine (EMLA) cream Apply to affected area once 30 g 3   meloxicam (MOBIC) 7.5 MG tablet Take 1 tablet (7.5 mg total) by mouth daily. 30 tablet 0   oxybutynin (DITROPAN XL) 15 MG 24 hr tablet TAKE 1 TABLET BY MOUTH ONCE DAILY 90 tablet 3   progesterone (PROMETRIUM) 200 MG capsule TAKE ONE CAPSULE BY MOUTH AT BEDTIME WITH FOOD FOR 12 DAYS SEQUENTIALLY PER 28 DAY CYCLE 36 capsule 3   promethazine (PHENERGAN) 25 MG tablet Take 1 tablet (25 mg total) by mouth every 12 (twelve) hours as needed for nausea or vomiting. 90 tablet 1   Semaglutide,0.25 or 0.'5MG'$ /DOS, (OZEMPIC, 0.25 OR 0.5 MG/DOSE,) 2 MG/1.5ML SOPN Inject into the skin.     No current facility-administered medications for this visit.   Facility-Administered Medications Ordered in Other Visits  Medication Dose Route Frequency Provider Last Rate Last Admin   sodium chloride flush (NS) 0.9 % injection 10 mL  10 mL Intravenous PRN Sindy Guadeloupe, MD   10 mL at 04/15/18 0825   Review of Systems - multiple positive symptoms General: fatigue, weakness, decreased appetite  HEENT: hearing loss due to chemotherapy, no headaches, no visual issues.   Lungs: shortness of breath, cough  Cardiac: no complaints  GI: abdominal pain periumbilical, LLQ and extending to flank; pain induced n/v   GU: as noted per interval history for bladder issues; vaginal spotting/pain; unable to have intercourse  Musculoskeletal: no complaints  Extremities: no complaints  Skin: no complaints  Neuro: feeling sad; peripheral neuropathy  Endocrine: no complaints  Psych: no complaints      Objective:  Physical Examination:  BP (!) 115/113   Pulse 87   Temp 97.8 F (36.6 C)   Resp 20   Wt 283 lb 1.6 oz (128.4 kg)   LMP  (LMP Unknown)   SpO2 98%   BMI 44.34 kg/m    Repeat BP 140s/101  ECOG Performance Status: 1 - Symptomatic but completely ambulatory  GENERAL: Patient is a well  appearing female in no acute distress HEENT:  PERRL, neck supple with midline trachea.  NODES:  No cervical, supraclavicular, axillary, or inguinal lymphadenopathy palpated.  LUNGS:  Clear to auscultation bilaterally.   HEART:  Regular rate and rhythm.  ABDOMEN:  Soft, tender to deep palpation umbilical and LLQ, nondistended and no masses/ascites/hernias/organomegaly EXTREMITIES:  No peripheral edema.   SKIN:  Clear with no obvious rashes or skin changes. No nail dyscrasia. NEURO:  Nonfocal. Well oriented.  Appropriate affect.  Pelvic: EGBUS: no lesions Cervix: unable to visualize fully Vagina: no lesions, no discharge or bleeding; foreshortened to 4 cm.  Uterus:  nontender; unable to assess size due to habitus.  Adnexa: no palpable masses, parametria smooth  Rectovaginal: deferred  Labs Lab Results  Component Value Date   WBC 7.4 05/13/2022   HGB 14.7 05/13/2022   HCT 43.1 05/13/2022   MCV 90.4 05/13/2022   PLT 433 (H) 05/13/2022     Chemistry      Component Value Date/Time   NA 137 05/13/2022 0914   NA 141 12/30/2021 1653   NA 142 09/11/2011 1701   K 3.9 05/13/2022 0914   K 4.2 09/11/2011 1701   CL 105 05/13/2022 0914   CL 109 (H) 09/11/2011 1701   CO2 22 05/13/2022 0914   CO2 22 09/11/2011 1701   BUN 14 05/13/2022 0914   BUN 6 12/30/2021 1653   BUN 10 09/11/2011 1701   CREATININE 0.80 05/13/2022 0914   CREATININE 0.67 09/11/2011 1701   GLU 84 04/16/2010 0000      Component Value Date/Time   CALCIUM 8.8 (L) 05/13/2022 0914   CALCIUM 9.3 09/11/2011 1701   ALKPHOS 69 05/13/2022 0914   ALKPHOS 101 09/11/2011 1701   AST 23 05/13/2022 0914   AST 28 09/11/2011 1701   ALT 19 05/13/2022 0914   ALT 20 09/11/2011 1701   BILITOT 0.4 05/13/2022 0914   BILITOT 0.3 12/30/2021 1653   BILITOT 0.3 09/11/2011 1701        Assessment:  Kristy Gordon is a 37 y.o. female diagnosed with at least stage IIB cervical adenocarcinoma 12/19 s/p primary chemoradiation with  excellent response based on PET scan and clinically NED on exam.   She has chronic pain and known radiation cystitis.  CT scan 9/23 normal.   Premature menopause and improved controlled vasomotor symptoms on current HRT regimen.   Sexual function concerns and vaginal agglutination s/p pelvic floor PT with no improvement.   Pulmonary nodules, uncertain etiology  Hypertension, asymptomatic. BP 146/103 on repeat.    Body mass index is 44.34 kg/m.  Medical co-morbidities complicating care: anxiety, depression, thyroiditis, smoker.  Plan:   Problem List Items Addressed This Visit       Genitourinary   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[place]) - Primary   Follow up with [place] in 8 months and also seeing Dr Janese Banks.   Repeat follow up with her PCP for thyroid management and blood pressure management.    The patient's diagnosis, an outline of the further diagnostic and laboratory studies which will be required, the recommendation, and alternatives were discussed. We also  reviewed her CT scan which was reassuring.   All questions were answered to the patient's satisfaction.  Mellody Drown, MD

## 2022-06-26 ENCOUNTER — Other Ambulatory Visit: Payer: Self-pay | Admitting: Family Medicine

## 2022-06-26 DIAGNOSIS — R1115 Cyclical vomiting syndrome unrelated to migraine: Secondary | ICD-10-CM

## 2022-06-29 ENCOUNTER — Inpatient Hospital Stay: Payer: Medicaid Other

## 2022-07-03 ENCOUNTER — Inpatient Hospital Stay: Payer: Medicaid Other | Attending: Radiation Oncology

## 2022-07-21 ENCOUNTER — Inpatient Hospital Stay: Payer: Medicaid Other | Attending: Radiation Oncology

## 2022-07-21 DIAGNOSIS — Z452 Encounter for adjustment and management of vascular access device: Secondary | ICD-10-CM | POA: Diagnosis present

## 2022-07-21 DIAGNOSIS — Z95828 Presence of other vascular implants and grafts: Secondary | ICD-10-CM

## 2022-07-21 DIAGNOSIS — C539 Malignant neoplasm of cervix uteri, unspecified: Secondary | ICD-10-CM | POA: Insufficient documentation

## 2022-07-21 MED ORDER — HEPARIN SOD (PORK) LOCK FLUSH 100 UNIT/ML IV SOLN
500.0000 [IU] | Freq: Once | INTRAVENOUS | Status: AC
  Administered 2022-07-21: 500 [IU] via INTRAVENOUS
  Filled 2022-07-21: qty 5

## 2022-07-21 MED ORDER — SODIUM CHLORIDE 0.9% FLUSH
10.0000 mL | Freq: Once | INTRAVENOUS | Status: AC
  Administered 2022-07-21: 10 mL via INTRAVENOUS
  Filled 2022-07-21: qty 10

## 2022-07-27 ENCOUNTER — Other Ambulatory Visit: Payer: Self-pay | Admitting: Family Medicine

## 2022-07-27 DIAGNOSIS — R1115 Cyclical vomiting syndrome unrelated to migraine: Secondary | ICD-10-CM

## 2022-07-28 NOTE — Telephone Encounter (Signed)
Requested medication (s) are due for refill today: yes  Requested medication (s) are on the active medication list: yes  Last refill:  06/26/22  Future visit scheduled: yes  Notes to clinic:  Unable to refill per protocol, cannot delegate.      Requested Prescriptions  Pending Prescriptions Disp Refills   ALPRAZolam (XANAX) 1 MG tablet [Pharmacy Med Name: ALPRAZolam 1 MG Oral Tablet] 90 tablet 0    Sig: TAKE 1 TABLET BY MOUTH THREE TIMES DAILY     Not Delegated - Psychiatry: Anxiolytics/Hypnotics 2 Failed - 07/27/2022  9:10 PM      Failed - This refill cannot be delegated      Failed - Valid encounter within last 6 months    Recent Outpatient Visits           7 months ago Cyclical vomiting syndrome not associated with migraine   [place] Ronnald Ramp, MD   7 months ago Need for influenza vaccination   [facility] Bosie Clos, MD   7 months ago LLQ pain   [place] [facility], [place], MD   9 months ago Recurrent major depressive disorder, in partial remission [facility])   [place] Bosie Clos, MD   11 months ago Hypothyroidism due to Hashimoto's thyroiditis   [facility] Bosie Clos, MD              Passed - Urine Drug Screen completed in last 360 days      Passed - Patient is not pregnant

## 2022-08-21 ENCOUNTER — Other Ambulatory Visit: Payer: Self-pay | Admitting: Nurse Practitioner

## 2022-08-23 ENCOUNTER — Other Ambulatory Visit: Payer: Self-pay | Admitting: Family Medicine

## 2022-08-23 DIAGNOSIS — R1115 Cyclical vomiting syndrome unrelated to migraine: Secondary | ICD-10-CM

## 2022-08-24 ENCOUNTER — Inpatient Hospital Stay: Payer: Medicaid Other | Attending: Radiation Oncology

## 2022-08-24 ENCOUNTER — Encounter: Payer: Self-pay | Admitting: Oncology

## 2022-08-24 DIAGNOSIS — Z95828 Presence of other vascular implants and grafts: Secondary | ICD-10-CM

## 2022-08-24 DIAGNOSIS — C539 Malignant neoplasm of cervix uteri, unspecified: Secondary | ICD-10-CM | POA: Insufficient documentation

## 2022-08-24 DIAGNOSIS — Z452 Encounter for adjustment and management of vascular access device: Secondary | ICD-10-CM | POA: Insufficient documentation

## 2022-08-24 MED ORDER — HEPARIN SOD (PORK) LOCK FLUSH 100 UNIT/ML IV SOLN
500.0000 [IU] | Freq: Once | INTRAVENOUS | Status: AC
  Administered 2022-08-24: 500 [IU] via INTRAVENOUS
  Filled 2022-08-24: qty 5

## 2022-08-24 MED ORDER — SODIUM CHLORIDE 0.9% FLUSH
10.0000 mL | Freq: Once | INTRAVENOUS | Status: AC
  Administered 2022-08-24: 10 mL via INTRAVENOUS
  Filled 2022-08-24: qty 10

## 2022-09-16 ENCOUNTER — Encounter: Payer: Self-pay | Admitting: Family Medicine

## 2022-09-17 ENCOUNTER — Other Ambulatory Visit: Payer: Self-pay | Admitting: Family Medicine

## 2022-09-17 MED ORDER — PROMETHAZINE HCL 25 MG PO TABS: 25.0000 mg | ORAL_TABLET | Freq: Three times a day (TID) | ORAL | 1 refills | Status: DC | PRN

## 2022-09-28 ENCOUNTER — Other Ambulatory Visit: Payer: Self-pay | Admitting: Family Medicine

## 2022-09-28 DIAGNOSIS — R1115 Cyclical vomiting syndrome unrelated to migraine: Secondary | ICD-10-CM

## 2022-10-15 ENCOUNTER — Emergency Department
Admission: EM | Admit: 2022-10-15 | Discharge: 2022-10-16 | Disposition: A | Discharge status: Home / Self Care | Payer: Medicaid Other | Attending: Emergency Medicine | Admitting: Emergency Medicine

## 2022-10-15 ENCOUNTER — Other Ambulatory Visit: Payer: Self-pay

## 2022-10-15 DIAGNOSIS — R197 Diarrhea, unspecified: Secondary | ICD-10-CM | POA: Diagnosis not present

## 2022-10-15 DIAGNOSIS — R1115 Cyclical vomiting syndrome unrelated to migraine: Secondary | ICD-10-CM | POA: Insufficient documentation

## 2022-10-15 DIAGNOSIS — R000 Tachycardia, unspecified: Secondary | ICD-10-CM | POA: Diagnosis not present

## 2022-10-15 DIAGNOSIS — R1110 Vomiting, unspecified: Secondary | ICD-10-CM | POA: Diagnosis present

## 2022-10-15 DIAGNOSIS — R109 Unspecified abdominal pain: Secondary | ICD-10-CM | POA: Diagnosis not present

## 2022-10-15 LAB — CBC
HCT: 44.4 % (ref 36.0–46.0)
Hemoglobin: 15.8 g/dL — ABNORMAL HIGH (ref 12.0–15.0)
MCH: 31.2 pg (ref 26.0–34.0)
MCHC: 35.6 g/dL (ref 30.0–36.0)
MCV: 87.6 fL (ref 80.0–100.0)
Platelets: 637 10*3/uL — ABNORMAL HIGH (ref 150–400)
RBC: 5.07 MIL/uL (ref 3.87–5.11)
RDW: 12.4 % (ref 11.5–15.5)
WBC: 15.9 10*3/uL — ABNORMAL HIGH (ref 4.0–10.5)
nRBC: 0 % (ref 0.0–0.2)

## 2022-10-15 LAB — COMPREHENSIVE METABOLIC PANEL
ALT: 19 U/L (ref 0–44)
AST: 27 U/L (ref 15–41)
Albumin: 4.8 g/dL (ref 3.5–5.0)
Alkaline Phosphatase: 78 U/L (ref 38–126)
Anion gap: 17 — ABNORMAL HIGH (ref 5–15)
BUN: 10 mg/dL (ref 6–20)
CO2: 16 mmol/L — ABNORMAL LOW (ref 22–32)
Calcium: 9.4 mg/dL (ref 8.9–10.3)
Chloride: 101 mmol/L (ref 98–111)
Creatinine, Ser: 0.84 mg/dL (ref 0.44–1.00)
GFR, Estimated: >60 mL/min (ref >60)
Glucose, Bld: 154 mg/dL — ABNORMAL HIGH (ref 70–99)
Potassium: 3.3 mmol/L — ABNORMAL LOW (ref 3.5–5.1)
Sodium: 134 mmol/L — ABNORMAL LOW (ref 135–145)
Total Bilirubin: 0.8 mg/dL (ref 0.3–1.2)
Total Protein: 9.3 g/dL — ABNORMAL HIGH (ref 6.5–8.1)

## 2022-10-15 LAB — LIPASE, BLOOD: Lipase: 49 U/L (ref 11–51)

## 2022-10-15 MED ORDER — DROPERIDOL 2.5 MG/ML IJ SOLN
2.5000 mg | Freq: Once | INTRAMUSCULAR | Status: AC
  Administered 2022-10-15: 2.5 mg via INTRAMUSCULAR
  Filled 2022-10-15: qty 2

## 2022-10-15 MED ORDER — SODIUM CHLORIDE 0.9 % IV BOLUS
1000.0000 mL | Freq: Once | INTRAVENOUS | Status: AC
  Administered 2022-10-15: 1000 mL via INTRAVENOUS

## 2022-10-15 MED ORDER — ONDANSETRON 4 MG PO TBDP
4.0000 mg | ORAL_TABLET | Freq: Once | ORAL | Status: AC | PRN
  Administered 2022-10-15: 4 mg via ORAL
  Filled 2022-10-15: qty 1

## 2022-10-15 NOTE — ED Triage Notes (Addendum)
Pt to ED via POV c/o vomiting since 1am today. Pt has been admitted for cyclical vomiting before. Pt also endorsing LLQ abd pain. Denies any CP, SOB, fevers. Hx of radiation cystitis and cervical cancer. Pt has power port

## 2022-10-15 NOTE — ED Provider Notes (Signed)
[facility] Provider Note    Event Date/Time   First MD Initiated Contact with Patient 10/15/22 2305     (approximate)   History   Emesis and Abdominal Pain   HPI  Kristy Gordon is a 37 y.o. female  who presents to the emergency department today because of concern for cyclical vomiting. States she has been dealing with cyclical vomiting for roughly 2 years. Most recent episode started about a week ago but got worse today. Does have medication at home but either it has not been effective or she has been unable to keep it down. Has had occasional diarrhea and some abdominal discomfort which is similar to what she has had in the past with her cyclical vomiting. No measured fevers. No known sick contacts.      Physical Exam   Triage Vital Signs: ED Triage Vitals  Encounter Vitals Group     BP 10/15/22 2050 (!) 173/107     Systolic BP Percentile --      Diastolic BP Percentile --      Pulse Rate 10/15/22 2050 (!) 140     Resp 10/15/22 2050 (!) 24     Temp 10/15/22 2050 98 F (36.7 C)     Temp Source 10/15/22 2050 Oral     SpO2 10/15/22 2050 98 %     Weight 10/15/22 2051 252 lb (114.3 kg)     Height 10/15/22 2051 5\' 7"  (1.702 m)     Head Circumference --      Peak Flow --      Pain Score 10/15/22 2051 4     Pain Loc --      Pain Education --      Exclude from Growth Chart --     Most recent vital signs: Vitals:   10/15/22 2050 10/15/22 2300  BP: (!) 173/107   Pulse: (!) 140 100  Resp: (!) 24 (!) 21  Temp: 98 F (36.7 C)   SpO2: 98%    General: Awake, alert, oriented. CV:  Good peripheral perfusion. Tachycardia. Resp:  Normal effort. Lungs clear. Abd:  No distention.   ED Results / Procedures / Treatments   Labs (all labs ordered are listed, but only abnormal results are displayed) Labs Reviewed  COMPREHENSIVE METABOLIC PANEL - Abnormal; Notable for the following components:      Result Value   Sodium 134 (*)    Potassium 3.3 (*)     CO2 16 (*)    Glucose, Bld 154 (*)    Total Protein 9.3 (*)    Anion gap 17 (*)    All other components within normal limits  CBC - Abnormal; Notable for the following components:   WBC 15.9 (*)    Hemoglobin 15.8 (*)    Platelets 637 (*)    All other components within normal limits  LIPASE, BLOOD  URINALYSIS, ROUTINE W REFLEX MICROSCOPIC     EKG  I, Phineas Semen, attending physician, personally viewed and interpreted this EKG  EKG Time: 2056 Rate: 128 Rhythm: sinus tachycardia Axis: normal Intervals: qtc 470 QRS: narrow ST changes: no st elevation Impression: abnormal ekg   RADIOLOGY None   PROCEDURES:  Critical Care performed:  No   MEDICATIONS ORDERED IN ED: Medications  ondansetron (ZOFRAN-ODT) disintegrating tablet 4 mg (4 mg Oral Given 10/15/22 2058)  droperidol (INAPSINE) 2.5 MG/ML injection 2.5 mg (2.5 mg Intramuscular Given 10/15/22 2114)     IMPRESSION / MDM / ASSESSMENT AND PLAN /  ED COURSE  I reviewed the triage vital signs and the nursing notes.                              Differential diagnosis includes, but is not limited to, cyclical vomiting, gastroenteritis, pancreatitis  Patient's presentation is most consistent with acute presentation with potential threat to life or bodily function.   The patient is on the cardiac monitor to evaluate for evidence of arrhythmia and/or significant heart rate changes.  Patient presented to the emergency department with nausea and vomiting. States she has history of cyclical vomiting and this reminds her of her episodes int eh past. Blood work with slight leukocytosis however patient has had this in the past. Patient was given IV fluids. Did feel improvement. Felt comfortable with discharge home. Will plan on discharging with prescription for phenergan suppository.       FINAL CLINICAL IMPRESSION(S) / ED DIAGNOSES   Final diagnoses:  Cyclical vomiting      Note:  This document was prepared  using Dragon voice recognition software and may include unintentional dictation errors.    Phineas Semen, MD 10/16/22 [phone]

## 2022-10-15 NOTE — ED Notes (Signed)
Pt is now awake sts she feels much better. Denies NV.

## 2022-10-16 LAB — URINALYSIS, ROUTINE W REFLEX MICROSCOPIC
Bacteria, UA: NONE SEEN
Bilirubin Urine: NEGATIVE
Glucose, UA: 50 mg/dL — AB
Ketones, ur: 20 mg/dL — AB
Leukocytes,Ua: NEGATIVE
Nitrite: NEGATIVE
Protein, ur: >=300 mg/dL — AB
Specific Gravity, Urine: 1.033 — ABNORMAL HIGH (ref 1.005–1.030)
pH: 5 (ref 5.0–8.0)

## 2022-10-16 MED ORDER — PROMETHAZINE HCL 25 MG RE SUPP: 25.0000 mg | Freq: Four times a day (QID) | RECTAL | 1 refills | Status: AC | PRN

## 2022-10-16 NOTE — Discharge Instructions (Addendum)
Please seek medical attention for any high fevers, chest pain, shortness of breath, change in behavior, persistent vomiting, bloody stool or any other new or concerning symptoms.  

## 2022-10-16 NOTE — ED Notes (Signed)
Discharge instructions provided by edp were discussed with pt. Pt verbalized understanding with no additional questions

## 2022-10-19 ENCOUNTER — Inpatient Hospital Stay: Payer: BC Managed Care – PPO | Attending: Radiation Oncology

## 2022-10-19 ENCOUNTER — Encounter: Payer: Self-pay | Admitting: Oncology

## 2022-10-19 DIAGNOSIS — C539 Malignant neoplasm of cervix uteri, unspecified: Secondary | ICD-10-CM | POA: Insufficient documentation

## 2022-10-19 DIAGNOSIS — R102 Pelvic and perineal pain: Secondary | ICD-10-CM

## 2022-10-19 DIAGNOSIS — Z452 Encounter for adjustment and management of vascular access device: Secondary | ICD-10-CM | POA: Diagnosis not present

## 2022-10-19 DIAGNOSIS — D500 Iron deficiency anemia secondary to blood loss (chronic): Secondary | ICD-10-CM

## 2022-10-19 DIAGNOSIS — R110 Nausea: Secondary | ICD-10-CM

## 2022-10-19 MED ORDER — SODIUM CHLORIDE 0.9% FLUSH
10.0000 mL | Freq: Once | INTRAVENOUS | Status: AC | PRN
  Administered 2022-10-19: 10 mL
  Filled 2022-10-19: qty 10

## 2022-10-19 MED ORDER — HEPARIN SOD (PORK) LOCK FLUSH 100 UNIT/ML IV SOLN
500.0000 [IU] | Freq: Once | INTRAVENOUS | Status: AC | PRN
  Administered 2022-10-19: 500 [IU]
  Filled 2022-10-19: qty 5

## 2022-10-19 NOTE — Patient Instructions (Signed)

## 2022-10-20 NOTE — Progress Notes (Unsigned)
      Established patient visit   Patient: Kristy Gordon   DOB: April 07, 1985   37 y.o. Female  MRN: [id number] Visit Date: 10/21/2022  Today's healthcare provider: Ronnald Ramp, MD   No chief complaint on file.  Subjective      ***  Medications: Outpatient Medications Prior to Visit  Medication Sig   ALPRAZolam (XANAX) 1 MG tablet TAKE 1 TABLET BY MOUTH THREE TIMES DAILY   amphetamine-dextroamphetamine (ADDERALL) 10 MG tablet Take 1 tablet (10 mg total) by mouth 2 (two) times daily with a meal.   cyclobenzaprine (FLEXERIL) 5 MG tablet Take 1 tablet (5 mg total) by mouth 3 (three) times daily as needed for muscle spasms.   Dulaglutide (TRULICITY) 0.75 MG/0.5ML SOPN Inject 0.75 mg into the skin once a week.   DULoxetine (CYMBALTA) 30 MG capsule Take 1 capsule (30 mg total) by mouth daily.   estradiol (ESTRACE) 1 MG tablet Take 1 tablet by mouth once daily   gabapentin (NEURONTIN) 300 MG capsule TAKE 1 CAPSULE BY MOUTH THREE TIMES DAILY   haloperidol (HALDOL) 0.5 MG tablet Take 1 tablet (0.5 mg total) by mouth every 8 (eight) hours as needed (nausea).   levothyroxine (SYNTHROID) 175 MCG tablet Take 1 tablet (175 mcg total) by mouth daily.   lidocaine-prilocaine (EMLA) cream Apply to affected area once   meloxicam (MOBIC) 7.5 MG tablet Take 1 tablet (7.5 mg total) by mouth daily.   oxybutynin (DITROPAN XL) 15 MG 24 hr tablet TAKE 1 TABLET BY MOUTH ONCE DAILY   progesterone (PROMETRIUM) 200 MG capsule TAKE ONE CAPSULE BY MOUTH AT BEDTIME WITH FOOD FOR 12 DAYS SEQUENTIALLY PER 28 DAY CYCLE   promethazine (PHENERGAN) 25 MG suppository Place 1 suppository (25 mg total) rectally every 6 (six) hours as needed for nausea or vomiting.   promethazine (PHENERGAN) 25 MG tablet Take 1 tablet (25 mg total) by mouth every 8 (eight) hours as needed for nausea or vomiting.   Semaglutide,0.25 or 0.5MG /DOS, (OZEMPIC, 0.25 OR 0.5 MG/DOSE,) 2 MG/1.5ML SOPN Inject into the skin.    Facility-Administered Medications Prior to Visit  Medication Dose Route Frequency Provider   sodium chloride flush (NS) 0.9 % injection 10 mL  10 mL Intravenous PRN Creig Hines, MD    Review of Systems  {Insert previous labs (optional):23779}  {See past labs  Heme  Chem  Endocrine  Serology  Results Review (optional):1}   Objective    LMP  (LMP Unknown)  {Insert last BP/Wt (optional):23777}  {See vitals history (optional):1}  Physical Exam  ***  No results found for any visits on 10/21/22.  Assessment & Plan     Problem List Items Addressed This Visit   None    No follow-ups on file.         Ronnald Ramp, MD  [facility] [phone] (phone) [phone] (fax)  [facility]

## 2022-10-21 ENCOUNTER — Ambulatory Visit (INDEPENDENT_AMBULATORY_CARE_PROVIDER_SITE_OTHER): Payer: BC Managed Care – PPO | Admitting: Family Medicine

## 2022-10-21 ENCOUNTER — Encounter: Payer: Self-pay | Admitting: Family Medicine

## 2022-10-21 ENCOUNTER — Encounter: Payer: Self-pay | Admitting: Oncology

## 2022-10-21 ENCOUNTER — Other Ambulatory Visit: Payer: Self-pay

## 2022-10-21 ENCOUNTER — Emergency Department
Admission: EM | Admit: 2022-10-21 | Discharge: 2022-10-21 | Disposition: A | Discharge status: Home / Self Care | Payer: BC Managed Care – PPO | Attending: Emergency Medicine | Admitting: Emergency Medicine

## 2022-10-21 VITALS — BP 128/87 | HR 86 | Temp 98.4°F | Resp 14 | Ht 67.0 in | Wt 260.2 lb

## 2022-10-21 DIAGNOSIS — E876 Hypokalemia: Secondary | ICD-10-CM

## 2022-10-21 DIAGNOSIS — N3001 Acute cystitis with hematuria: Secondary | ICD-10-CM | POA: Diagnosis not present

## 2022-10-21 DIAGNOSIS — Z8541 Personal history of malignant neoplasm of cervix uteri: Secondary | ICD-10-CM | POA: Diagnosis not present

## 2022-10-21 DIAGNOSIS — R310 Gross hematuria: Secondary | ICD-10-CM | POA: Insufficient documentation

## 2022-10-21 DIAGNOSIS — R1115 Cyclical vomiting syndrome unrelated to migraine: Secondary | ICD-10-CM | POA: Diagnosis not present

## 2022-10-21 DIAGNOSIS — F419 Anxiety disorder, unspecified: Secondary | ICD-10-CM

## 2022-10-21 DIAGNOSIS — M545 Low back pain, unspecified: Secondary | ICD-10-CM | POA: Insufficient documentation

## 2022-10-21 DIAGNOSIS — R112 Nausea with vomiting, unspecified: Secondary | ICD-10-CM | POA: Insufficient documentation

## 2022-10-21 DIAGNOSIS — R1110 Vomiting, unspecified: Secondary | ICD-10-CM | POA: Diagnosis not present

## 2022-10-21 HISTORY — DX: Cyclical vomiting syndrome unrelated to migraine: R11.15

## 2022-10-21 LAB — POCT URINALYSIS DIPSTICK
Bilirubin, UA: NEGATIVE
Glucose, UA: NEGATIVE
Ketones, UA: NEGATIVE
Leukocytes, UA: NEGATIVE
Nitrite, UA: NEGATIVE
Protein, UA: POSITIVE — AB
Spec Grav, UA: 1.02 (ref 1.010–1.025)
Urobilinogen, UA: 0.2 E.U./dL
pH, UA: 6 (ref 5.0–8.0)

## 2022-10-21 LAB — COMPREHENSIVE METABOLIC PANEL
ALT: 18 U/L (ref 0–44)
AST: 19 U/L (ref 15–41)
Albumin: 4.3 g/dL (ref 3.5–5.0)
Alkaline Phosphatase: 51 U/L (ref 38–126)
Anion gap: 8 (ref 5–15)
BUN: 9 mg/dL (ref 6–20)
CO2: 22 mmol/L (ref 22–32)
Calcium: 8.7 mg/dL — ABNORMAL LOW (ref 8.9–10.3)
Chloride: 106 mmol/L (ref 98–111)
Creatinine, Ser: 0.76 mg/dL (ref 0.44–1.00)
GFR, Estimated: >60 mL/min (ref >60)
Glucose, Bld: 117 mg/dL — ABNORMAL HIGH (ref 70–99)
Potassium: 3.3 mmol/L — ABNORMAL LOW (ref 3.5–5.1)
Sodium: 136 mmol/L (ref 135–145)
Total Bilirubin: 0.7 mg/dL (ref 0.3–1.2)
Total Protein: 8.1 g/dL (ref 6.5–8.1)

## 2022-10-21 LAB — CBC
HCT: 41.7 % (ref 36.0–46.0)
Hemoglobin: 14.5 g/dL (ref 12.0–15.0)
MCH: 31.1 pg (ref 26.0–34.0)
MCHC: 34.8 g/dL (ref 30.0–36.0)
MCV: 89.5 fL (ref 80.0–100.0)
Platelets: 566 10*3/uL — ABNORMAL HIGH (ref 150–400)
RBC: 4.66 MIL/uL (ref 3.87–5.11)
RDW: 12.5 % (ref 11.5–15.5)
WBC: 13.1 10*3/uL — ABNORMAL HIGH (ref 4.0–10.5)
nRBC: 0 % (ref 0.0–0.2)

## 2022-10-21 LAB — LIPASE, BLOOD: Lipase: 61 U/L — ABNORMAL HIGH (ref 11–51)

## 2022-10-21 MED ORDER — SULFAMETHOXAZOLE-TRIMETHOPRIM 800-160 MG PO TABS
1.0000 | ORAL_TABLET | Freq: Two times a day (BID) | ORAL | 0 refills | Status: AC
Start: 2022-10-21 — End: 2022-10-28

## 2022-10-21 MED ORDER — HALOPERIDOL LACTATE 5 MG/ML IJ SOLN
5.0000 mg | Freq: Once | INTRAMUSCULAR | Status: AC
  Administered 2022-10-21: 5 mg via INTRAVENOUS
  Filled 2022-10-21: qty 1

## 2022-10-21 MED ORDER — LACTATED RINGERS IV BOLUS
1000.0000 mL | Freq: Once | INTRAVENOUS | Status: AC
  Administered 2022-10-21: 1000 mL via INTRAVENOUS

## 2022-10-21 NOTE — Assessment & Plan Note (Signed)
Recent onset of left lower back pain, hematuria, and fever. Urinalysis showed protein and blood. Unclear if symptoms are related to previous bladder damage from treatment. -acute  -Collect urine culture. -Empirically treat for UTI, avoiding penicillin due to allergy. -Bactrim 800-160mg  twice daily for 7 days  -RTC for worsening symptoms

## 2022-10-21 NOTE — ED Provider Notes (Signed)
[facility] Provider Note    Event Date/Time   First MD Initiated Contact with Patient 10/21/22 1704     (approximate)  History   Chief Complaint: Emesis  HPI  Kristy Gordon is a 37 y.o. female with a past medical history of anxiety, cervical cancer status postchemotherapy and radiation who presents to the emergency department for cyclical vomiting per patient.  Patient states since childhood she has had episodes of vomiting approximately once a month or so.  States they went away in adulthood however after chemotherapy and radiation for cervical cancer she states her symptoms have returned.  States this is the fourth episode over the past 1 month or so.  States today's episode started around 3:30 PM and she has not been able to stop vomiting since.  Patient states that Haldol has worked very well for her in the past and is requesting this medication to see if it works.  Patient denies any significant abdominal pain patient is afebrile no dysuria.  Mildly tachycardic at 100 bpm.  Physical Exam   Triage Vital Signs: ED Triage Vitals  Encounter Vitals Group     BP 10/21/22 1641 (!) 170/139     Systolic BP Percentile --      Diastolic BP Percentile --      Pulse Rate 10/21/22 1641 (!) 105     Resp 10/21/22 1641 20     Temp 10/21/22 1641 97.8 F (36.6 C)     Temp src --      SpO2 10/21/22 1641 98 %     Weight 10/21/22 1642 260 lb 2.3 oz (118 kg)     Height 10/21/22 1642 5\' 7"  (1.702 m)     Head Circumference --      Peak Flow --      Pain Score 10/21/22 1642 5     Pain Loc --      Pain Education --      Exclude from Growth Chart --     Most recent vital signs: Vitals:   10/21/22 1641  BP: (!) 170/139  Pulse: (!) 105  Resp: 20  Temp: 97.8 F (36.6 C)  SpO2: 98%    General: Awake, no distress.  Nauseated dry heaving at times. CV:  Good peripheral perfusion.  Regular rate and rhythm  Resp:  Normal effort.  Equal breath sounds bilaterally.   Abd:  No distention.  Soft, nontender.  No rebound or guarding.  ED Results / Procedures / Treatments   MEDICATIONS ORDERED IN ED: Medications  haloperidol lactate (HALDOL) injection 5 mg (has no administration in time range)  lactated ringers bolus 1,000 mL (has no administration in time range)     IMPRESSION / MDM / ASSESSMENT AND PLAN / ED COURSE  I reviewed the triage vital signs and the nursing notes.  Patient's presentation is most consistent with acute presentation with potential threat to life or bodily function.  Patient presents emergency department for nausea vomiting states a history of cyclical vomiting to which this feels identical.  Patient is lab work is reassuring slight lipase elevation at 61 reassuring chemistry including normal anion gap and normal LFTs, slight leukocytosis at 13,000 on her CBC.  We will IV hydrate, treat with 5 mg of IV Haldol and reassess.  Urinalysis is pending.  Patient agreeable to plan of care and workup.  Patient is feeling much better.  States her symptoms have resolved.  Will discharge patient with PCP follow-up.  Discussed return  precautions.  FINAL CLINICAL IMPRESSION(S) / ED DIAGNOSES   Nausea vomiting    Note:  This document was prepared using Dragon voice recognition software and may include unintentional dictation errors.   Minna Antis, MD 10/21/22 1930

## 2022-10-21 NOTE — Assessment & Plan Note (Signed)
Recent labs showed low potassium levels. -acute 2/2 to diarrhea and vomiting  -Check potassium levels next week, CMP ordered

## 2022-10-21 NOTE — ED Triage Notes (Signed)
Patient here for cyclical vomiting; states this episode started at 1530 today. Reports LLQ pain.

## 2022-10-21 NOTE — Assessment & Plan Note (Signed)
Increased anxiety, requiring increased use of Xanax. Previous negative experience with therapy. -chronic, worsening  -continue Xanax 1mg  TID (reports requiring third dose where she previously only needed two doses daily) -given psychiatry resources to establish care given hx of trauma  -Consider referral to recommended therapists for further management.

## 2022-10-21 NOTE — Patient Instructions (Addendum)
Caryn Section, MD, PA [address], [place], [place] [address] [phone]   Tamela Oddi   [address], [place], [place] [address] Phone: [phone]

## 2022-10-21 NOTE — Assessment & Plan Note (Signed)
Recent severe episodes requiring ER visits for dehydration. Patient believes it may be neurologically based due to the cyclical nature and response to physical stimuli during episodes. -chronic  -worsening episodes with longer recovery time  -limited medication options due to concomitant diarrhea with vomiting episodes  -Refer to neurology for further evaluation.

## 2022-10-21 NOTE — ED Notes (Signed)
Paduchoswki, MD made aware of patient falling while walking into triage. No injuries from fall.

## 2022-10-22 ENCOUNTER — Encounter: Payer: Self-pay | Admitting: Family Medicine

## 2022-10-23 LAB — URINE CULTURE

## 2022-10-25 ENCOUNTER — Emergency Department
Admission: EM | Admit: 2022-10-25 | Discharge: 2022-10-25 | Disposition: A | Discharge status: Home / Self Care | Payer: BC Managed Care – PPO | Attending: Emergency Medicine | Admitting: Emergency Medicine

## 2022-10-25 ENCOUNTER — Other Ambulatory Visit: Payer: Self-pay

## 2022-10-25 DIAGNOSIS — R1115 Cyclical vomiting syndrome unrelated to migraine: Secondary | ICD-10-CM | POA: Diagnosis not present

## 2022-10-25 DIAGNOSIS — R1110 Vomiting, unspecified: Secondary | ICD-10-CM | POA: Diagnosis not present

## 2022-10-25 LAB — CBC
HCT: 41.2 % (ref 36.0–46.0)
Hemoglobin: 15 g/dL (ref 12.0–15.0)
MCH: 31.6 pg (ref 26.0–34.0)
MCHC: 36.4 g/dL — ABNORMAL HIGH (ref 30.0–36.0)
MCV: 86.7 fL (ref 80.0–100.0)
Platelets: 510 10*3/uL — ABNORMAL HIGH (ref 150–400)
RBC: 4.75 MIL/uL (ref 3.87–5.11)
RDW: 12.4 % (ref 11.5–15.5)
WBC: 14.3 10*3/uL — ABNORMAL HIGH (ref 4.0–10.5)
nRBC: 0 % (ref 0.0–0.2)

## 2022-10-25 LAB — LIPASE, BLOOD: Lipase: 54 U/L — ABNORMAL HIGH (ref 11–51)

## 2022-10-25 LAB — COMPREHENSIVE METABOLIC PANEL
ALT: 20 U/L (ref 0–44)
AST: 22 U/L (ref 15–41)
Albumin: 4.8 g/dL (ref 3.5–5.0)
Alkaline Phosphatase: 56 U/L (ref 38–126)
Anion gap: 13 (ref 5–15)
BUN: 13 mg/dL (ref 6–20)
CO2: 20 mmol/L — ABNORMAL LOW (ref 22–32)
Calcium: 9.2 mg/dL (ref 8.9–10.3)
Chloride: 103 mmol/L (ref 98–111)
Creatinine, Ser: 0.86 mg/dL (ref 0.44–1.00)
GFR, Estimated: >60 mL/min (ref >60)
Glucose, Bld: 111 mg/dL — ABNORMAL HIGH (ref 70–99)
Potassium: 3.4 mmol/L — ABNORMAL LOW (ref 3.5–5.1)
Sodium: 136 mmol/L (ref 135–145)
Total Bilirubin: 0.6 mg/dL (ref 0.3–1.2)
Total Protein: 8.5 g/dL — ABNORMAL HIGH (ref 6.5–8.1)

## 2022-10-25 MED ORDER — HALOPERIDOL LACTATE 5 MG/ML IJ SOLN
5.0000 mg | Freq: Once | INTRAMUSCULAR | Status: AC
  Administered 2022-10-25: 5 mg via INTRAVENOUS
  Filled 2022-10-25: qty 1

## 2022-10-25 MED ORDER — LACTATED RINGERS IV BOLUS
1000.0000 mL | Freq: Once | INTRAVENOUS | Status: AC
  Administered 2022-10-25: 1000 mL via INTRAVENOUS

## 2022-10-25 MED ORDER — ONDANSETRON HCL 4 MG/2ML IJ SOLN
4.0000 mg | Freq: Once | INTRAMUSCULAR | Status: AC | PRN
  Administered 2022-10-25: 4 mg via INTRAVENOUS
  Filled 2022-10-25: qty 2

## 2022-10-25 NOTE — ED Provider Triage Note (Signed)
Emergency Medicine Provider Triage Evaluation Note  Kristy Gordon , a 37 y.o. female  was evaluated in triage.  Pt complains of cyclical vomiting syndrome. She has been seen here for this multiple times. Patient is requesting haldol as this usually helps.  Review of Systems  Positive: Vomiting, nausea, abdominal pain Negative: Fever  Physical Exam  BP (!) 134/101   Pulse (!) 110   Temp 98 F (36.7 C) (Oral)   Resp 20   Ht 5\' 7"  (1.702 m)   Wt 118 kg   LMP  (LMP Unknown)   SpO2 98%   BMI 40.74 kg/m  Gen:   Awake, actively vomiting Resp:  Normal effort  MSK:   Moves extremities without difficulty    Medical Decision Making  Medically screening exam initiated at 6:12 PM.  Appropriate orders placed. I started patient on LR and haldol. EKG completed before administering haldol.  Kristy Gordon was informed that the remainder of the evaluation will be completed by another provider, this initial triage assessment does not replace that evaluation, and the importance of remaining in the ED until their evaluation is complete.    Cameron Ali, PA-C 10/25/22 1841

## 2022-10-25 NOTE — ED Notes (Signed)
Pt requests to leave AMA. Per provider, wait 15 mins post haldol and then OK to dispo. Pt informed and agreeable. Discontinued fluids and IV per pt request.

## 2022-10-25 NOTE — ED Provider Notes (Signed)
[facility] Emergency Department Provider Note     Event Date/Time   First MD Initiated Contact with Patient 10/25/22 1855     (approximate)   History   Emesis   HPI  Kristy Gordon is a 37 y.o. female who present with cyclic vomiting syndrome. Pt has been seen by this ED earlier this week for the same. Patient reports that haldol helps.       Physical Exam   Triage Vital Signs: ED Triage Vitals  Encounter Vitals Group     BP 10/25/22 1647 (!) 134/101     Systolic BP Percentile --      Diastolic BP Percentile --      Pulse Rate 10/25/22 1647 (!) 110     Resp 10/25/22 1647 20     Temp 10/25/22 1647 98 F (36.7 C)     Temp Source 10/25/22 1647 Oral     SpO2 10/25/22 1647 98 %     Weight 10/25/22 1645 260 lb 2.3 oz (118 kg)     Height 10/25/22 1645 5\' 7"  (1.702 m)     Head Circumference --      Peak Flow --      Pain Score 10/25/22 1645 0     Pain Loc --      Pain Education --      Exclude from Growth Chart --     Most recent vital signs: Vitals:   10/25/22 1647  BP: (!) 134/101  Pulse: (!) 110  Resp: 20  Temp: 98 F (36.7 C)  SpO2: 98%    General Awake, actively vomiting, moderate distress HEENT NCAT. PERRL. EOMI. No rhinorrhea. Mucous membranes are moist. CV:  Good peripheral perfusion.  RESP:  Normal effort.  ABD:  No distention.    ED Results / Procedures / Treatments   Labs (all labs ordered are listed, but only abnormal results are displayed) Labs Reviewed  LIPASE, BLOOD - Abnormal; Notable for the following components:      Result Value   Lipase 54 (*)    All other components within normal limits  COMPREHENSIVE METABOLIC PANEL - Abnormal; Notable for the following components:   Potassium 3.4 (*)    CO2 20 (*)    Glucose, Bld 111 (*)    Total Protein 8.5 (*)    All other components within normal limits  CBC - Abnormal; Notable for the following components:   WBC 14.3 (*)    MCHC 36.4 (*)    Platelets 510  (*)    All other components within normal limits  URINALYSIS, ROUTINE W REFLEX MICROSCOPIC     EKG  Ordered to evaluate QT before giving haldol. Was not completed before medication was given.   No results found.   PROCEDURES:  Critical Care performed: No  Procedures   MEDICATIONS ORDERED IN ED: Medications  ondansetron (ZOFRAN) injection 4 mg (4 mg Intravenous Given 10/25/22 1657)  lactated ringers bolus 1,000 mL (1,000 mLs Intravenous New Bag/Given 10/25/22 1844)  haloperidol lactate (HALDOL) injection 5 mg (5 mg Intravenous Given 10/25/22 1841)     IMPRESSION / MDM / ASSESSMENT AND PLAN / ED COURSE  I reviewed the triage vital signs and the nursing notes.                             37 year old female presents with an exacerbation of cyclical vomiting syndrome. BP and HR elevated  in triage. Patient in moderate distress on exam. I saw the patient in the subwait area.   Differential diagnosis includes, but is not limited to, cyclical vomiting syndrome.  Patient's presentation is most consistent with severe exacerbation of chronic illness. CMP notable for mild hypokalemia, low CO2, and elevated protein. Lipase slightly elevated but improved from her last visit. Patient does have a slightly elevated WBC.   Patient was given zofran, haldol and LR while in the subwait area.   Patient's diagnosis is consistent with cyclical vomiting syndrome.  Patient wanted to leave AMA after the haldol was given. I explained that she needed to wait 15 minutes before leaving to ensure she would not have a reaction to the medication. Patient was agreeable and then left AMA. Patient is given ED precautions to return to the ED for any worsening or new symptoms.     FINAL CLINICAL IMPRESSION(S) / ED DIAGNOSES   Final diagnoses:  Cyclic vomiting syndrome     Rx / DC Orders   ED Discharge Orders     None        Note:  This document was prepared using Dragon voice recognition software  and may include unintentional dictation errors.    Cameron Ali, PA-C 10/25/22 1905    Merwyn Katos, MD 10/25/22 [phone]

## 2022-10-25 NOTE — ED Triage Notes (Addendum)
Pt to ed from home via POV for cyclical vomiting. Pt has been seen multiple times for same. Pt states "they give me fluids and haldol each time". Pt is caox4, actively dry heaving in triage.

## 2022-10-26 ENCOUNTER — Other Ambulatory Visit: Payer: Self-pay | Admitting: Obstetrics and Gynecology

## 2022-10-26 ENCOUNTER — Other Ambulatory Visit: Payer: Self-pay | Admitting: Family Medicine

## 2022-10-26 DIAGNOSIS — R1115 Cyclical vomiting syndrome unrelated to migraine: Secondary | ICD-10-CM

## 2022-10-27 ENCOUNTER — Other Ambulatory Visit: Payer: Self-pay | Admitting: Family Medicine

## 2022-10-27 NOTE — Telephone Encounter (Signed)
Medication Refill - Medication: haloperidol (HALDOL) 0.5 MG tablet   Has the patient contacted their pharmacy? Yes.    Preferred Pharmacy (with phone number or street name):  [facility] [address] [place], [place] - [address] Phone: [phone]  Fax: [phone]     Has the patient been seen for an appointment in the last year OR does the patient have an upcoming appointment? Yes.    Agent: Please be advised that RX refills may take up to 3 business days. We ask that you follow-up with your pharmacy.

## 2022-10-28 ENCOUNTER — Other Ambulatory Visit: Payer: Self-pay | Admitting: Obstetrics and Gynecology

## 2022-10-28 NOTE — Telephone Encounter (Signed)
Requested medication (s) are due for refill today:yes Requested medication (s) are on the active medication list:yes Last refill:  12/17/21 #6 tab  Future visit scheduled:yes  Notes to clinic:  med not delegated to NT to RF    Requested Prescriptions  Pending Prescriptions Disp Refills   haloperidol (HALDOL) 0.5 MG tablet 6 tablet 0    Sig: Take 1 tablet (0.5 mg total) by mouth every 8 (eight) hours as needed (nausea).     Not Delegated - Psychiatry: Antipsychotics - First Generation (Typical) - haloperidol Failed - 10/27/2022  2:05 PM      Failed - This refill cannot be delegated      Failed - Last BP in normal range    BP Readings from Last 1 Encounters:  10/25/22 (!) 134/101         Failed - Lipid Panel in normal range within the last 12 months    Cholesterol  Date Value Ref Range Status  04/16/2010 200 0 - 200 mg/dL Final   LDL Cholesterol  Date Value Ref Range Status  04/16/2010 110 mg/dL Final   HDL  Date Value Ref Range Status  04/16/2010 56 35 - 70 mg/dL Final   Triglycerides  Date Value Ref Range Status  04/16/2010 172 (A) 40 - 160 mg/dL Final         Failed - CMP within normal limits and completed in the last 12 months    Albumin  Date Value Ref Range Status  10/25/2022 4.8 3.5 - 5.0 g/dL Final  56/38/7564 4.3 3.9 - 4.9 g/dL Final  33/29/5188 2.8 (L) 3.4 - 5.0 g/dL Final   Alkaline Phosphatase  Date Value Ref Range Status  10/25/2022 56 38 - 126 U/L Final  09/11/2011 101 50 - 136 Unit/L Final   ALT  Date Value Ref Range Status  10/25/2022 20 0 - 44 U/L Final   SGPT (ALT)  Date Value Ref Range Status  09/11/2011 20 U/L Final    Comment:    12-78 NOTE: NEW REFERENCE RANGE 02/27/2011    AST  Date Value Ref Range Status  10/25/2022 22 15 - 41 U/L Final   SGOT(AST)  Date Value Ref Range Status  09/11/2011 28 15 - 37 Unit/L Final   BUN  Date Value Ref Range Status  10/25/2022 13 6 - 20 mg/dL Final  41/66/0630 6 6 - 20 mg/dL Final   16/04/930 10 7 - 18 mg/dL Final   Calcium  Date Value Ref Range Status  10/25/2022 9.2 8.9 - 10.3 mg/dL Final   Calcium, Total  Date Value Ref Range Status  09/11/2011 9.3 8.5 - 10.1 mg/dL Final   CO2  Date Value Ref Range Status  10/25/2022 20 (L) 22 - 32 mmol/L Final   Co2  Date Value Ref Range Status  09/11/2011 22 21 - 32 mmol/L Final   Creatinine  Date Value Ref Range Status  09/11/2011 0.67 0.60 - 1.30 mg/dL Final   Creatinine, Ser  Date Value Ref Range Status  10/25/2022 0.86 0.44 - 1.00 mg/dL Final   Glucose  Date Value Ref Range Status  09/11/2011 83 65 - 99 mg/dL Final  35/57/3220 84 mg/dL Final   Glucose, Bld  Date Value Ref Range Status  10/25/2022 111 (H) 70 - 99 mg/dL Final    Comment:    Glucose reference range applies only to samples taken after fasting for at least 8 hours.   Glucose-Capillary  Date Value Ref Range Status  02/20/2019 104 (  H) 70 - 99 mg/dL Final   Potassium  Date Value Ref Range Status  10/25/2022 3.4 (L) 3.5 - 5.1 mmol/L Final  09/11/2011 4.2 3.5 - 5.1 mmol/L Final   Sodium  Date Value Ref Range Status  10/25/2022 136 135 - 145 mmol/L Final  12/30/2021 141 134 - 144 mmol/L Final  09/11/2011 142 136 - 145 mmol/L Final   Total Bilirubin  Date Value Ref Range Status  10/25/2022 0.6 0.3 - 1.2 mg/dL Final   Bilirubin,Total  Date Value Ref Range Status  09/11/2011 0.3 0.2 - 1.0 mg/dL Final   Bilirubin Total  Date Value Ref Range Status  12/30/2021 0.3 0.0 - 1.2 mg/dL Final   Bilirubin, Total  Date Value Ref Range Status  04/16/2010 0.3 mg/dL Final   Protein, ur  Date Value Ref Range Status  10/15/2022 >=300 (A) NEGATIVE mg/dL Final   Protein, UA  Date Value Ref Range Status  10/21/2022 Positive (A) Negative Final   Total Protein  Date Value Ref Range Status  10/25/2022 8.5 (H) 6.5 - 8.1 g/dL Final  66/44/0347 7.4 6.0 - 8.5 g/dL Final  42/59/5638 6.9 6.4 - 8.2 g/dL Final   EGFR (African American)  Date  Value Ref Range Status  09/11/2011 >60  Final   GFR calc Af Amer  Date Value Ref Range Status  12/01/2019 >60 >60 mL/min Final   eGFR  Date Value Ref Range Status  12/30/2021 108 >59 mL/min/1.73 Final   EGFR (Non-African Amer.)  Date Value Ref Range Status  09/11/2011 >60  Final    Comment:    eGFR values <52mL/min/1.73 m2 may be an indication of chronic kidney disease (CKD). Calculated eGFR is useful in patients with stable renal function. The eGFR calculation will not be reliable in acutely ill patients when serum creatinine is changing rapidly. It is not useful in  patients on dialysis. The eGFR calculation may not be applicable to patients at the low and high extremes of body sizes, pregnant women, and vegetarians.    GFR, Estimated  Date Value Ref Range Status  10/25/2022 >60 >60 mL/min Final    Comment:    (NOTE) Calculated using the CKD-EPI Creatinine Equation (2021)          Passed - Completed PHQ-2 or PHQ-9 in the last 360 days      Passed - Last Heart Rate in normal range    Pulse Readings from Last 1 Encounters:  10/25/22 (!) 110         Passed - Valid encounter within last 6 months    Recent Outpatient Visits           1 week ago Cyclical vomiting syndrome not associated with migraine   [place] [facility] Simmons-Robinson, [place], MD   10 months ago Cyclical vomiting syndrome not associated with migraine   [place] [facility] Simmons-Robinson, [place], MD   10 months ago Need for influenza vaccination   [facility] Bosie Clos, MD   10 months ago LLQ pain   [place] [facility] Simmons-Robinson, [place], MD   1 year ago Recurrent major depressive disorder, in partial remission [facility])   [place] [facility] Bosie Clos, MD       Future Appointments             In 1 month Simmons-Robinson, Tawanna Cooler, MD [facility]  Passed - CBC within normal limits and completed in the last 12 months    WBC  Date Value Ref Range Status  10/25/2022 14.3 (H) 4.0 - 10.5 K/uL Final   RBC  Date Value Ref Range Status  10/25/2022 4.75 3.87 - 5.11 MIL/uL Final   Hemoglobin  Date Value Ref Range Status  10/25/2022 15.0 12.0 - 15.0 g/dL Final  16/01/9603 54.0 11.1 - 15.9 g/dL Final   HCT  Date Value Ref Range Status  10/25/2022 41.2 36.0 - 46.0 % Final   Hematocrit  Date Value Ref Range Status  09/11/2015 42.8 34.0 - 46.6 % Final   MCHC  Date Value Ref Range Status  10/25/2022 36.4 (H) 30.0 - 36.0 g/dL Final   [facility]  Date Value Ref Range Status  10/25/2022 31.6 26.0 - 34.0 pg Final   MCV  Date Value Ref Range Status  10/25/2022 86.7 80.0 - 100.0 fL Final  09/11/2015 92 79 - 97 fL Final  09/11/2011 93 80 - 100 fL Final   No results found for: "PLTCOUNTKUC", "LABPLAT", "POCPLA" RDW  Date Value Ref Range Status  10/25/2022 12.4 11.5 - 15.5 % Final  09/11/2015 13.6 12.3 - 15.4 % Final  09/11/2011 13.2 11.5 - 14.5 % Final

## 2022-10-28 NOTE — Telephone Encounter (Signed)
Requested medication (s) are due for refill today: yes   Requested medication (s) are on the active medication list: yes  Last refill:  09/2522 #90   Future visit scheduled: yes  Notes to clinic:  med not delegated to NT to RF    Requested Prescriptions  Pending Prescriptions Disp Refills   ALPRAZolam (XANAX) 1 MG tablet [Pharmacy Med Name: ALPRAZolam 1 MG Oral Tablet] 90 tablet 0    Sig: TAKE 1 TABLET BY MOUTH THREE TIMES DAILY (PT WILL NEED APPT FOR ADDITIONAL REFILLS)     Not Delegated - Psychiatry: Anxiolytics/Hypnotics 2 Failed - 10/26/2022  8:08 PM      Failed - This refill cannot be delegated      Passed - Urine Drug Screen completed in last 360 days      Passed - Patient is not pregnant      Passed - Valid encounter within last 6 months    Recent Outpatient Visits           1 week ago Cyclical vomiting syndrome not associated with migraine   [place] [facility] Simmons-Robinson, [place], MD   10 months ago Cyclical vomiting syndrome not associated with migraine   [place] [facility] Simmons-Robinson, [place], MD   10 months ago Need for influenza vaccination   [facility] Bosie Clos, MD   10 months ago LLQ pain   [place] [facility] [place], [place], MD   1 year ago Recurrent major depressive disorder, in partial remission [facility])   [place] [facility] Bosie Clos, MD       Future Appointments             In 1 month Simmons-Robinson, Tawanna Cooler, MD [facility]

## 2022-10-29 ENCOUNTER — Other Ambulatory Visit: Payer: Self-pay | Admitting: Family Medicine

## 2022-10-29 MED ORDER — HALOPERIDOL 0.5 MG PO TABS: 0.5000 mg | ORAL_TABLET | Freq: Three times a day (TID) | ORAL | 1 refills | Status: AC | PRN

## 2022-11-02 ENCOUNTER — Other Ambulatory Visit: Payer: Self-pay | Admitting: Family Medicine

## 2022-11-02 ENCOUNTER — Other Ambulatory Visit: Payer: Self-pay | Admitting: Nurse Practitioner

## 2022-11-02 ENCOUNTER — Encounter: Payer: Self-pay | Admitting: Family Medicine

## 2022-11-02 DIAGNOSIS — R1115 Cyclical vomiting syndrome unrelated to migraine: Secondary | ICD-10-CM

## 2022-11-02 MED ORDER — ESTRADIOL 1 MG PO TABS: 1.0000 mg | ORAL_TABLET | Freq: Every day | ORAL | 0 refills | Status: DC

## 2022-11-02 NOTE — Telephone Encounter (Signed)
Medication Refill - Medication: ALPRAZolam (XANAX) 1 MG tablet    Pt is out of medication. Pt was seen by PCP on 10/21/2022. Showing in the system pt need an appt for refill.   Has the patient contacted their pharmacy? Yes.    Preferred Pharmacy (with phone number or street name):  [facility] [place], [place] - [address]  Phone: [phone] Fax: [phone]  Has the patient been seen for an appointment in the last year OR does the patient have an upcoming appointment? Yes.    Agent: Please be advised that RX refills may take up to 3 business days. We ask that you follow-up with your pharmacy.

## 2022-11-03 ENCOUNTER — Other Ambulatory Visit: Payer: Self-pay | Admitting: Family Medicine

## 2022-11-03 ENCOUNTER — Telehealth: Payer: Self-pay | Admitting: Family Medicine

## 2022-11-03 DIAGNOSIS — R1115 Cyclical vomiting syndrome unrelated to migraine: Secondary | ICD-10-CM

## 2022-11-03 NOTE — Telephone Encounter (Signed)
Pt is calling in to check the status of her refill for ALPRAZolam Prudy Feeler) 1 MG tablet [[id number]] . Pt says she has been trying to get this medication for a week and doesn't know why it's being delayed. Pt says she has been without her medication for three days and it is affecting her ability to work.

## 2022-11-03 NOTE — Telephone Encounter (Signed)
Requested medications are due for refill today.  yes  Requested medications are on the active medications list.  yes  Last refill. 09/29/2022 #90 0 rf  Future visit scheduled.   yes  Notes to clinic.  Refill not delegated.    Requested Prescriptions  Pending Prescriptions Disp Refills   ALPRAZolam (XANAX) 1 MG tablet 90 tablet 0    Sig: Take 1 tablet (1 mg total) by mouth 3 (three) times daily.     Not Delegated - Psychiatry: Anxiolytics/Hypnotics 2 Failed - 11/02/2022  3:52 PM      Failed - This refill cannot be delegated      Passed - Urine Drug Screen completed in last 360 days      Passed - Patient is not pregnant      Passed - Valid encounter within last 6 months    Recent Outpatient Visits           1 week ago Cyclical vomiting syndrome not associated with migraine   [facility] Simmons-Robinson, [place], MD   10 months ago Cyclical vomiting syndrome not associated with migraine   [facility] Simmons-Robinson, Tawanna Cooler, MD   10 months ago Need for influenza vaccination   [facility] Bosie Clos, MD   11 months ago LLQ pain   [facility] Simmons-Robinson, [place], MD   1 year ago Recurrent major depressive disorder, in partial remission [facility])    [facility] Bosie Clos, MD       Future Appointments             In 1 month Simmons-Robinson, Tawanna Cooler, MD [facility]

## 2022-11-03 NOTE — Telephone Encounter (Signed)
Requested on 10/26/22 and denied stating needs appointment, has upcoming on 12/22/22 with PCP, last OV 10/21/22. Requested again twice on 11/03/22 encounters. Requested by pharmacy today and routed to Dr. Roxan Hockey, pending approval.

## 2022-11-11 ENCOUNTER — Other Ambulatory Visit: Payer: Self-pay

## 2022-11-11 ENCOUNTER — Inpatient Hospital Stay: Payer: BC Managed Care – PPO | Attending: Radiation Oncology

## 2022-11-11 ENCOUNTER — Encounter: Payer: Self-pay | Admitting: Oncology

## 2022-11-11 ENCOUNTER — Inpatient Hospital Stay (HOSPITAL_BASED_OUTPATIENT_CLINIC_OR_DEPARTMENT_OTHER): Payer: BC Managed Care – PPO | Admitting: Oncology

## 2022-11-11 VITALS — BP 137/105 | HR 95 | Temp 96.5°F | Resp 18 | Ht 67.0 in | Wt 253.6 lb

## 2022-11-11 DIAGNOSIS — Z8541 Personal history of malignant neoplasm of cervix uteri: Secondary | ICD-10-CM | POA: Insufficient documentation

## 2022-11-11 DIAGNOSIS — C539 Malignant neoplasm of cervix uteri, unspecified: Secondary | ICD-10-CM

## 2022-11-11 DIAGNOSIS — Z923 Personal history of irradiation: Secondary | ICD-10-CM | POA: Insufficient documentation

## 2022-11-11 DIAGNOSIS — Z08 Encounter for follow-up examination after completed treatment for malignant neoplasm: Secondary | ICD-10-CM

## 2022-11-11 DIAGNOSIS — Z9221 Personal history of antineoplastic chemotherapy: Secondary | ICD-10-CM | POA: Insufficient documentation

## 2022-11-11 DIAGNOSIS — Z452 Encounter for adjustment and management of vascular access device: Secondary | ICD-10-CM | POA: Diagnosis not present

## 2022-11-11 DIAGNOSIS — R1030 Lower abdominal pain, unspecified: Secondary | ICD-10-CM | POA: Diagnosis not present

## 2022-11-11 LAB — CBC WITH DIFFERENTIAL/PLATELET
Abs Immature Granulocytes: 0.02 10*3/uL (ref 0.00–0.07)
Basophils Absolute: 0.1 10*3/uL (ref 0.0–0.1)
Basophils Relative: 1 %
Eosinophils Absolute: 0.1 10*3/uL (ref 0.0–0.5)
Eosinophils Relative: 1 %
HCT: 39 % (ref 36.0–46.0)
Hemoglobin: 13.4 g/dL (ref 12.0–15.0)
Immature Granulocytes: 0 %
Lymphocytes Relative: 36 %
Lymphs Abs: 2.7 10*3/uL (ref 0.7–4.0)
MCH: 31.4 pg (ref 26.0–34.0)
MCHC: 34.4 g/dL (ref 30.0–36.0)
MCV: 91.3 fL (ref 80.0–100.0)
Monocytes Absolute: 0.5 10*3/uL (ref 0.1–1.0)
Monocytes Relative: 7 %
Neutro Abs: 4.2 10*3/uL (ref 1.7–7.7)
Neutrophils Relative %: 55 %
Platelets: 397 10*3/uL (ref 150–400)
RBC: 4.27 MIL/uL (ref 3.87–5.11)
RDW: 13.1 % (ref 11.5–15.5)
WBC: 7.6 10*3/uL (ref 4.0–10.5)
nRBC: 0 % (ref 0.0–0.2)

## 2022-11-11 LAB — COMPREHENSIVE METABOLIC PANEL
ALT: 19 U/L (ref 0–44)
AST: 19 U/L (ref 15–41)
Albumin: 4.1 g/dL (ref 3.5–5.0)
Alkaline Phosphatase: 53 U/L (ref 38–126)
Anion gap: 8 (ref 5–15)
BUN: 12 mg/dL (ref 6–20)
CO2: 21 mmol/L — ABNORMAL LOW (ref 22–32)
Calcium: 8.7 mg/dL — ABNORMAL LOW (ref 8.9–10.3)
Chloride: 107 mmol/L (ref 98–111)
Creatinine, Ser: 0.81 mg/dL (ref 0.44–1.00)
GFR, Estimated: >60 mL/min (ref >60)
Glucose, Bld: 104 mg/dL — ABNORMAL HIGH (ref 70–99)
Potassium: 3.8 mmol/L (ref 3.5–5.1)
Sodium: 136 mmol/L (ref 135–145)
Total Bilirubin: 0.2 mg/dL — ABNORMAL LOW (ref 0.3–1.2)
Total Protein: 7.4 g/dL (ref 6.5–8.1)

## 2022-11-13 ENCOUNTER — Encounter: Payer: Self-pay | Admitting: Oncology

## 2022-11-13 NOTE — Progress Notes (Signed)
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 Positive pelvic LN s/p concurrent chemoradiation currently in remission    Chief complaint/ Reason for visit-routine follow-up of cervical cancer  Heme/Onc history:  patient is a 37 year old female G0 who initially presented to the ER on 03/15/2018 with symptoms of abdominal pain and cramping.  She has also been having irregular menstrual bleeding and spotting on and off for the last few months.She had an ultrasound pelvis done in the ER which showed a large 5.8 cm hypoechoic vascular mass within the cervix and the lower uterine segment.  Patient had last seen GYN in 2013 and had not had a Pap smear done since then.  Patient was seen by GYN Dr. Gaynelle Arabian on 1213 and was found to have firm friable cervix and multiple biopsies were taken which showed adenocarcinoma.  She was then seen by Dr. Johnnette Litter from GYN oncology.  Pelvic exam showed anteverted cervix that was replaced by tumor and extension of cancer into the left parametrium and possibly to the pelvic sidewall.    PET CT scan on 03/28/2018 showed 6 cm hypermetabolic cervical mass consistent with primary cervical carcinoma.  Mild hypermetabolic bilateral parametrial right perirectal bilateral iliac lymph nodes consistent with metastatic disease.  No evidence of metastatic disease within the abdomen chest or neck.   Cycle 1  of weekly cisplatin and radiation started on 04/11/2018.  She completed chemoradiation on 05/19/2018 followed by vaginal brachii therapy.  PET CT scan thereafter did not show any evidence of metastatic disease and interval resolution of hypermetabolic pelvic adenopathy and no hypermetabolism seen in the area of the cervix.   Patient also follows up with urology for issues of pelvic pain urinary frequency.  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  This is not associated with any nausea vomiting or changes in her bowel habits.  Denies any blood in her stools.  Denies any trauma.  Pain has been intermittent without any aggravating or relieving factors  ECOG PS- 0 Pain scale- 4 Opioid associated constipation-no  Review of systems- Review of Systems  Constitutional:  Negative for chills, fever, malaise/fatigue and weight loss.  HENT:  Negative for congestion, ear discharge and nosebleeds.   Eyes:  Negative for blurred vision.  Respiratory:  Negative for cough, hemoptysis, sputum production, shortness of breath and wheezing.   Cardiovascular:  Negative for chest pain, palpitations, orthopnea and claudication.  Gastrointestinal:  Positive for abdominal pain. Negative for blood in stool, constipation, diarrhea, heartburn, melena, nausea and vomiting.  Genitourinary:  Negative for dysuria, flank pain, frequency, hematuria and urgency.  Musculoskeletal:  Negative for back pain, joint pain and myalgias.  Skin:  Negative for rash.  Neurological:  Negative for dizziness, tingling, focal weakness, seizures, weakness and headaches.  Endo/Heme/Allergies:  Does not bruise/bleed easily.  Psychiatric/Behavioral:  Negative for depression and suicidal ideas. The patient does not have  insomnia.       Allergies  Allergen Reactions   Amoxicillin Rash    Did it involve swelling of the  face/tongue/throat, SOB, or low BP? No Did it involve sudden or severe rash/hives, skin peeling, or any reaction on the inside of your mouth or nose? No Did you need to seek medical attention at a hospital or doctor's office? No When did it last happen?  Childhood     If all above answers are "NO", may proceed with cephalosporin use.    Penicillins Hives    Did it involve swelling of the face/tongue/throat, SOB, or low BP? No Did it involve sudden or severe rash/hives, skin peeling, or any reaction on the inside of your mouth or nose? No Did you need to seek medical attention at a hospital or doctor's office? No When did it last happen?  Childhood     If all above answers are "NO", may proceed with cephalosporin use.      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   History of cervical cancer    Hypothyroid    Low back pain      Past Surgical History:  Procedure Laterality Date   BUNIONECTOMY Right    PORTA CATH INSERTION N/A 03/31/2018   Procedure: PORTA CATH INSERTION;  Surgeon: Annice Needy, MD;  Location: [facility] INVASIVE CV LAB;  Service: Cardiovascular;  Laterality: N/A;   TONSILLECTOMY     TRANSURETHRAL RESECTION OF BLADDER TUMOR N/A 06/20/2019   Procedure: TRANSURETHRAL RESECTION OF BLADDER TUMOR (TURBT);  Surgeon: Riki Altes, MD;  Location: [facility] ORS;  Service: Urology;  Laterality: N/A;    Social History   Socioeconomic History   Marital status: Married    Spouse name: Not on file   Number of children: Not on file   Years of education: Not on file   Highest education level: Not on file  Occupational History   Not on file  Tobacco Use   Smoking status: Some Days    Current packs/day: 0.15    Average packs/day: 0.2 packs/day for 10.0 years (1.5 ttl pk-yrs)    Types: Cigarettes   Smokeless tobacco: Never   Tobacco comments:     2 weeks to use a pack  Vaping Use   Vaping status: Some Days   Substances: Mixture of cannabinoids   Devices: marijuana vape  Substance and Sexual Activity   Alcohol use: Yes    Comment: rare   Drug use: Yes    Types: Marijuana    Comment: quit opiates 5 years ago   Sexual activity: Yes    Birth control/protection: None  Other Topics Concern   Not on file  Social History Narrative   Not on file   Social Determinants of Health   Financial Resource Strain: Not on file  Food Insecurity: Not on file  Transportation Needs: Not on file  Physical Activity: Not on file  Stress: Not on file  Social Connections: Not on file  Intimate Partner Violence: Not on file    Family History  Adopted: Yes  Family history unknown: Yes     Current Outpatient Medications:    ALPRAZolam (XANAX) 1 MG tablet, TAKE 1 TABLET BY MOUTH THREE TIMES DAILY (PT WILL NEED APPT FOR ADDITIONAL REFILLS), Disp: 90 tablet, Rfl: 1  estradiol (ESTRACE) 1 MG tablet, Take 1 tablet (1 mg total) by mouth daily., Disp: 90 tablet, Rfl: 0   haloperidol (HALDOL) 0.5 MG tablet, Take 1 tablet (0.5 mg total) by mouth every 8 (eight) hours as needed (nausea)., Disp: 6 tablet, Rfl: 1   levothyroxine (SYNTHROID) 175 MCG tablet, Take 1 tablet (175 mcg total) by mouth daily., Disp: 90 tablet, Rfl: 3   lidocaine-prilocaine (EMLA) cream, Apply to affected area once, Disp: 30 g, Rfl: 3   oxybutynin (DITROPAN XL) 15 MG 24 hr tablet, TAKE 1 TABLET BY MOUTH ONCE DAILY, Disp: 90 tablet, Rfl: 3   progesterone (PROMETRIUM) 200 MG capsule, TAKE ONE CAPSULE BY MOUTH AT BEDTIME WITH FOOD FOR 12 DAYS SEQUENTIALLY PER 28 DAY CYCLE, Disp: 36 capsule, Rfl: 3   promethazine (PHENERGAN) 25 MG suppository, Place 1 suppository (25 mg total) rectally every 6 (six) hours as needed for nausea or vomiting., Disp: 12 each, Rfl: 1   promethazine (PHENERGAN) 25 MG tablet, Take 1 tablet (25 mg total) by mouth every 8 (eight) hours as needed for nausea or  vomiting., Disp: 90 tablet, Rfl: 1   Semaglutide,0.25 or 0.5MG /DOS, (OZEMPIC, 0.25 OR 0.5 MG/DOSE,) 2 MG/1.5ML SOPN, Inject into the skin., Disp: , Rfl:  No current facility-administered medications for this visit.  Facility-Administered Medications Ordered in Other Visits:    sodium chloride flush (NS) 0.9 % injection 10 mL, 10 mL, Intravenous, PRN, Creig Hines, MD, 10 mL at 04/15/18 0825  Physical exam:  Vitals:   11/11/22 1036  BP: (!) 137/105  Pulse: 95  Resp: 18  Temp: (!) 96.5 F (35.8 C)  TempSrc: Tympanic  SpO2: 100%  Weight: 253 lb 9.6 oz (115 kg)  Height: 5\' 7"  (1.702 m)   Physical Exam Cardiovascular:     Rate and Rhythm: Normal rate and regular rhythm.     Heart sounds: Normal heart sounds.  Pulmonary:     Effort: Pulmonary effort is normal.     Breath sounds: Normal breath sounds.  Abdominal:     General: Bowel sounds are normal. There is no distension.     Palpations: Abdomen is soft.     Comments: Mild tenderness to palpation in the left lower quadrant  Skin:    General: Skin is warm and dry.  Neurological:     Mental Status: She is alert and oriented to person, place, and time.         Latest Ref Rng & Units 11/11/2022   10:29 AM  CMP  Glucose 70 - 99 mg/dL 621   BUN 6 - 20 mg/dL 12   Creatinine 3.08 - 1.00 mg/dL 6.57   Sodium 846 - 962 mmol/L 136   Potassium 3.5 - 5.1 mmol/L 3.8   Chloride 98 - 111 mmol/L 107   CO2 22 - 32 mmol/L 21   Calcium 8.9 - 10.3 mg/dL 8.7   Total Protein 6.5 - 8.1 g/dL 7.4   Total Bilirubin 0.3 - 1.2 mg/dL 0.2   Alkaline Phos 38 - 126 U/L 53   AST 15 - 41 U/L 19   ALT 0 - 44 U/L 19       Latest Ref Rng & Units 11/11/2022   10:29 AM  CBC  WBC 4.0 - 10.5 K/uL 7.6   Hemoglobin 12.0 - 15.0 g/dL 95.2   Hematocrit 84.1 - 46.0 % 39.0   Platelets 150 - 400 K/uL 397      Assessment and plan- Patient is a 37 y.o. female  with adenocarcinoma of the cervix stage IIIc T2 N1 M0 s/p concurrent chemoradiation currently in  remission.  She is here for routine follow-up  Etiology of left lower quadrant abdominal pain is unclear.  It could be secondary to pain associated with chronic radiation versus other etiologies but will need to rule out recurrent disease.  I am proceedingCT abdomen and pelvis with contrast to be done in the next 1 to 2 weeks.  Will call the patient with the results of.  She is seeing GYN oncology again in 2 months and I will see her back in April 2025 with labs   Visit Diagnosis 1. Encounter for follow-up surveillance of cervical cancer   2. Lower abdominal pain      Dr. Owens Shark, MD, MPH [facility] [phone] 11/13/2022 8:00 AM

## 2022-11-25 ENCOUNTER — Ambulatory Visit
Admission: RE | Admit: 2022-11-25 | Discharge: 2022-11-25 | Disposition: A | Discharge status: Home / Self Care | Payer: BC Managed Care – PPO | Source: Ambulatory Visit | Attending: Oncology | Admitting: Oncology

## 2022-11-25 DIAGNOSIS — Z08 Encounter for follow-up examination after completed treatment for malignant neoplasm: Secondary | ICD-10-CM | POA: Insufficient documentation

## 2022-11-25 DIAGNOSIS — Z8541 Personal history of malignant neoplasm of cervix uteri: Secondary | ICD-10-CM | POA: Diagnosis not present

## 2022-11-25 DIAGNOSIS — R160 Hepatomegaly, not elsewhere classified: Secondary | ICD-10-CM | POA: Diagnosis not present

## 2022-11-25 DIAGNOSIS — K449 Diaphragmatic hernia without obstruction or gangrene: Secondary | ICD-10-CM | POA: Diagnosis not present

## 2022-11-25 DIAGNOSIS — I700 Atherosclerosis of aorta: Secondary | ICD-10-CM | POA: Diagnosis not present

## 2022-11-25 DIAGNOSIS — R1030 Lower abdominal pain, unspecified: Secondary | ICD-10-CM | POA: Insufficient documentation

## 2022-11-25 MED ORDER — IOHEXOL 300 MG/ML  SOLN
100.0000 mL | Freq: Once | INTRAMUSCULAR | Status: AC | PRN
  Administered 2022-11-25: 100 mL via INTRAVENOUS

## 2022-11-25 MED ORDER — HEPARIN SOD (PORK) LOCK FLUSH 100 UNIT/ML IV SOLN
500.0000 [IU] | Freq: Once | INTRAVENOUS | Status: AC
  Administered 2022-11-25: 500 [IU] via INTRAVENOUS
  Filled 2022-11-25: qty 5

## 2022-11-25 MED ORDER — HEPARIN SOD (PORK) LOCK FLUSH 100 UNIT/ML IV SOLN
INTRAVENOUS | Status: AC
  Filled 2022-11-25: qty 5

## 2022-11-27 ENCOUNTER — Encounter: Payer: Self-pay | Admitting: *Deleted

## 2022-11-27 ENCOUNTER — Other Ambulatory Visit: Payer: Self-pay | Admitting: Nurse Practitioner

## 2022-11-27 ENCOUNTER — Other Ambulatory Visit: Payer: Self-pay | Admitting: Family Medicine

## 2022-11-30 ENCOUNTER — Encounter: Payer: Self-pay | Admitting: Oncology

## 2022-11-30 NOTE — Telephone Encounter (Signed)
Requested medication (s) are due for refill today:yes  Requested medication (s) are on the active medication list: yes  Last refill:  09/17/22 #90 1 RF  Future visit scheduled:yes  Notes to clinic:  med not delegated to NT to RF   Requested Prescriptions  Pending Prescriptions Disp Refills   promethazine (PHENERGAN) 25 MG tablet [Pharmacy Med Name: Promethazine HCl 25 MG Oral Tablet] 90 tablet 0    Sig: TAKE 1 TABLET BY MOUTH EVERY 8 HOURS AS NEEDED FOR NAUSEA AND VOMITING     Not Delegated - Gastroenterology: Antiemetics Failed - 11/27/2022  2:45 PM      Failed - This refill cannot be delegated      Passed - Valid encounter within last 6 months    Recent Outpatient Visits           1 month ago Cyclical vomiting syndrome not associated with migraine   [place] [facility] Simmons-Robinson, [place], MD   11 months ago Cyclical vomiting syndrome not associated with migraine   [facility] [facility] Simmons-Robinson, [place], MD   11 months ago Need for influenza vaccination   [facility] Bosie Clos, MD   12 months ago LLQ pain   [place] [facility] Simmons-Robinson, [place], MD   1 year ago Recurrent major depressive disorder, in partial remission [facility])   [place] [facility] Bosie Clos, MD       Future Appointments             In 3 weeks Simmons-Robinson, Tawanna Cooler, MD [facility]

## 2022-12-14 ENCOUNTER — Inpatient Hospital Stay: Payer: BC Managed Care – PPO | Attending: Radiation Oncology

## 2022-12-21 ENCOUNTER — Telehealth: Payer: Self-pay | Admitting: Family Medicine

## 2022-12-22 ENCOUNTER — Ambulatory Visit: Payer: Medicaid Other | Admitting: Family Medicine

## 2022-12-30 ENCOUNTER — Other Ambulatory Visit: Payer: Self-pay | Admitting: Family Medicine

## 2022-12-30 ENCOUNTER — Telehealth: Payer: Self-pay | Admitting: Family Medicine

## 2022-12-30 DIAGNOSIS — R1115 Cyclical vomiting syndrome unrelated to migraine: Secondary | ICD-10-CM

## 2022-12-30 NOTE — Telephone Encounter (Signed)
Medication Refill - Medication: ALPRAZolam (XANAX) 1 MG tablet and promethazine (PHENERGAN) 25 MG tablet  Has the patient contacted their pharmacy? No.  Preferred Pharmacy (with phone number or street name): [facility] [address], [place] - [address] [place] [address] Phone: [phone]  Fax: [phone]   Has the patient been seen for an appointment in the last year OR does the patient have an upcoming appointment? Yes.    Agent: Please be advised that RX refills may take up to 3 business days. We ask that you follow-up with your pharmacy.  Patient said she will be out of her meds in 2 days and will need a refill to get her to her next appt on Oct 14th.

## 2023-01-18 ENCOUNTER — Encounter: Payer: Self-pay | Admitting: Family Medicine

## 2023-01-18 ENCOUNTER — Ambulatory Visit (INDEPENDENT_AMBULATORY_CARE_PROVIDER_SITE_OTHER): Payer: BC Managed Care – PPO | Admitting: Family Medicine

## 2023-01-18 VITALS — BP 124/84 | HR 85 | Ht 67.0 in | Wt 253.5 lb

## 2023-01-18 DIAGNOSIS — R1115 Cyclical vomiting syndrome unrelated to migraine: Secondary | ICD-10-CM

## 2023-01-18 DIAGNOSIS — Z23 Encounter for immunization: Secondary | ICD-10-CM

## 2023-01-18 MED ORDER — FLUTICASONE PROPIONATE 50 MCG/ACT NA SUSP: 2.0000 | Freq: Every day | NASAL | 6 refills | Status: AC

## 2023-01-18 MED ORDER — PROMETHAZINE HCL 25 MG PO TABS
25.0000 mg | ORAL_TABLET | Freq: Three times a day (TID) | ORAL | 0 refills | Status: AC | PRN
Start: 2023-01-29 — End: ?

## 2023-01-18 MED ORDER — ALPRAZOLAM 1 MG PO TABS
1.0000 mg | ORAL_TABLET | Freq: Three times a day (TID) | ORAL | 0 refills | Status: AC | PRN
Start: 2023-01-29 — End: ?

## 2023-01-18 NOTE — Progress Notes (Signed)
Established patient visit   Patient: Kristy Gordon   DOB: [birth year]   37 y.o. Female  MRN: [id number] Visit Date: 01/18/2023  Today's healthcare provider: Ronnald Ramp, MD   Chief Complaint  Patient presents with   Emesis    2 month follow-up   Flu Vaccine    Patient reports some nasal congestion due to allergies X 2 months. Would like to have vaccine administered in left deltoid   Medication Refill    Not needing any today but would like to make sure nothing further is due in case she needs a refill   Subjective     HPI     Emesis    Additional comments: 2 month follow-up        Flu Vaccine    Additional comments: Patient reports some nasal congestion due to allergies X 2 months. Would like to have vaccine administered in left deltoid        Medication Refill    Additional comments: Not needing any today but would like to make sure nothing further is due in case she needs a refill      Last edited by Acey Lav, CMA on 01/18/2023  9:07 AM.       Discussed the use of AI scribe software for clinical note transcription with the patient, who gave verbal consent to proceed.  History of Present Illness   The patient, with a history of cyclical vomiting syndrome (CVS), radiation-induced bladder damage, and thyroid disease, reports intermittent episodes of severe nausea and vomiting. These episodes are described as sudden and intense, akin to being "hit by a train." The patient has had two episodes since the last visit, one of which required an emergency department visit. The patient manages these episodes with Xanax, Haldol, and Promethazine, but only uses Haldol for severe episodes due to its side effects, including muscle spasms and heightened alertness.  The patient also reports nasal congestion, suspected to be due to allergies. She does not currently use any over-the-counter allergy medications.  The patient is on Semaglutide (Ozempic) for  weight management and has lost seven pounds since the last visit. She took a two-week break from this medication due to a vacation but plans to restart it soon.  The patient also mentions a history of cancer treatment, which she believes may have contributed to the onset of her CVS. She reports that the vomiting episodes began after the completion of her cancer treatment. She also notes radiation-induced bladder damage and suspects a connection between this and her CVS, as she often experiences pain in the same area before a vomiting episode.  The patient is due to switch primary care providers soon and plans to address her thyroid disease and Synthroid dosage with her new provider.         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   levothyroxine (SYNTHROID) 175 MCG tablet Take 1 tablet (175  mcg total) by mouth daily.   lidocaine-prilocaine (EMLA) cream Apply to affected area once   oxybutynin (DITROPAN XL) 15 MG 24 hr tablet TAKE 1 TABLET BY MOUTH ONCE DAILY   progesterone (PROMETRIUM) 200 MG capsule TAKE ONE CAPSULE BY MOUTH AT BEDTIME WITH FOOD FOR 12 DAYS SEQUENTIALLY PER 28 DAY CYCLE   promethazine (PHENERGAN) 25 MG suppository Place 1 suppository (25 mg total) rectally every 6 (six) hours as needed for nausea or vomiting.   Semaglutide,0.25 or 0.5MG /DOS, (OZEMPIC, 0.25 OR 0.5 MG/DOSE,) 2 MG/1.5ML SOPN Inject into the skin.   [DISCONTINUED] ALPRAZolam (XANAX) 1 MG tablet TAKE 1 TABLET BY MOUTH THREE TIMES DAILY . APPOINTMENT REQUIRED FOR  FUTURE REFILLS   [DISCONTINUED] promethazine (PHENERGAN) 25 MG tablet TAKE 1 TABLET BY MOUTH EVERY 8 HOURS AS NEEDED FOR NAUSEA AND VOMITING   Facility-Administered Medications Prior to Visit  Medication Dose Route Frequency Provider   sodium chloride flush (NS) 0.9 % injection 10 mL  10 mL Intravenous PRN Creig Hines, MD    Review of Systems  Last thyroid functions Lab Results  Component Value Date   TSH 0.277 (L) 12/15/2021        Objective    BP 124/84 (BP Location: Left Arm, Patient Position: Sitting, Cuff Size: Large)   Pulse 85   Ht 5\' 7"  (1.702 m)   Wt 253 lb 8 oz (115 kg)   LMP  (LMP Unknown)   SpO2 95%   BMI 39.70 kg/m   BP Readings from Last 3 Encounters:  01/18/23 124/84  11/11/22 (!) 137/105  10/25/22 (!) 134/101   Wt Readings from Last 3 Encounters:  01/18/23 253 lb 8 oz (115 kg)  11/11/22 253 lb 9.6 oz (115 kg)  10/25/22 260 lb 2.3 oz (118 kg)       Physical Exam  Physical Exam   MEASUREMENTS: WT- down seven more pounds since last time GENERAL: No acute distress. HEENT: Nasal passages clear. CHEST: Lungs clear to auscultation bilaterally. CARDIOVASCULAR: Regular rate and rhythm. ABDOMEN: No abdominal tenderness.       No results found for any visits on 01/18/23.  Assessment & Plan     Problem List Items Addressed This Visit     Cyclical vomiting syndrome not associated with migraine   Relevant Medications   ALPRAZolam (XANAX) 1 MG tablet (Start on 01/29/2023)   promethazine (PHENERGAN) 25 MG tablet (Start on 01/29/2023)   Other Visit Diagnoses     Immunization due    -  Primary   Relevant Orders   Flu vaccine trivalent PF, 6mos and older(Flulaval,Afluria,Fluarix,Fluzone) (Completed)   Influenza vaccine needed       Relevant Orders   Flu vaccine trivalent PF, 6mos and older(Flulaval,Afluria,Fluarix,Fluzone) (Completed)          Cyclical Vomiting Syndrome Recent ED visit for vomiting, with one additional episode not requiring  ED visit. Currently managed with Xanax 1mg  TID, Haldol 0.5mg  every 8 hours as needed, and Phenergan 25mg  every 8 hours as needed. Patient reports Haldol causes muscle spasms and agitation, but effectively controls nausea. -Continue current medications. -Refill Xanax and Phenergan prescriptions on 01/30/2023.  Allergic Rhinitis Reports congestion, no current treatment. -Prescribe Flonase, to be used daily for best results.  Weight Loss Lost 7 pounds since last visit, possibly due to vomiting episodes and use of Ozempic. -Continue Ozempic as tolerated.  Thyroid Function Last checked in September, patient to follow up with new PCP on 02/03/2023. -No action needed at this time.  Follow-up  Patient is transferring care to a previous PCP. -No further appointments scheduled at this clinic.      No follow-ups on file.       Ronnald Ramp, MD  [facility] [phone] (phone) [phone] (fax)  [facility]

## 2023-02-03 ENCOUNTER — Inpatient Hospital Stay: Payer: BC Managed Care – PPO | Attending: Radiation Oncology

## 2023-02-03 DIAGNOSIS — R1115 Cyclical vomiting syndrome unrelated to migraine: Secondary | ICD-10-CM | POA: Diagnosis not present

## 2023-02-03 DIAGNOSIS — C539 Malignant neoplasm of cervix uteri, unspecified: Secondary | ICD-10-CM | POA: Diagnosis not present

## 2023-02-03 DIAGNOSIS — I471 Supraventricular tachycardia, unspecified: Secondary | ICD-10-CM | POA: Diagnosis not present

## 2023-02-03 DIAGNOSIS — N3040 Irradiation cystitis without hematuria: Secondary | ICD-10-CM | POA: Diagnosis not present

## 2023-02-03 DIAGNOSIS — E063 Autoimmune thyroiditis: Secondary | ICD-10-CM | POA: Diagnosis not present

## 2023-02-03 DIAGNOSIS — E039 Hypothyroidism, unspecified: Secondary | ICD-10-CM | POA: Diagnosis not present

## 2023-02-05 DIAGNOSIS — L02213 Cutaneous abscess of chest wall: Secondary | ICD-10-CM | POA: Diagnosis not present

## 2023-02-08 ENCOUNTER — Inpatient Hospital Stay: Payer: BC Managed Care – PPO | Attending: Radiation Oncology

## 2023-02-08 ENCOUNTER — Other Ambulatory Visit: Payer: Self-pay | Admitting: *Deleted

## 2023-02-08 DIAGNOSIS — C539 Malignant neoplasm of cervix uteri, unspecified: Secondary | ICD-10-CM | POA: Diagnosis present

## 2023-02-08 DIAGNOSIS — Z95828 Presence of other vascular implants and grafts: Secondary | ICD-10-CM

## 2023-02-08 DIAGNOSIS — Z452 Encounter for adjustment and management of vascular access device: Secondary | ICD-10-CM | POA: Diagnosis present

## 2023-02-08 MED ORDER — LIDOCAINE-PRILOCAINE 2.5-2.5 % EX CREA
TOPICAL_CREAM | CUTANEOUS | 3 refills | Status: AC
Start: 2023-02-08 — End: ?

## 2023-02-08 MED ORDER — HEPARIN SOD (PORK) LOCK FLUSH 100 UNIT/ML IV SOLN
500.0000 [IU] | Freq: Once | INTRAVENOUS | Status: AC
  Administered 2023-02-08: 500 [IU] via INTRAVENOUS
  Filled 2023-02-08: qty 5

## 2023-02-08 MED ORDER — SODIUM CHLORIDE 0.9% FLUSH
10.0000 mL | Freq: Once | INTRAVENOUS | Status: AC
  Administered 2023-02-08: 10 mL via INTRAVENOUS
  Filled 2023-02-08: qty 10

## 2023-03-07 ENCOUNTER — Other Ambulatory Visit: Payer: Self-pay | Admitting: Nurse Practitioner

## 2023-03-10 ENCOUNTER — Encounter: Payer: Self-pay | Admitting: Oncology

## 2023-04-05 ENCOUNTER — Inpatient Hospital Stay: Payer: BC Managed Care – PPO | Attending: Radiation Oncology

## 2023-04-08 ENCOUNTER — Encounter: Payer: Self-pay | Admitting: Oncology

## 2023-04-08 NOTE — Telephone Encounter (Signed)
 Error

## 2023-04-16 DIAGNOSIS — I471 Supraventricular tachycardia, unspecified: Secondary | ICD-10-CM | POA: Diagnosis not present

## 2023-04-16 DIAGNOSIS — R739 Hyperglycemia, unspecified: Secondary | ICD-10-CM | POA: Diagnosis not present

## 2023-04-16 DIAGNOSIS — C539 Malignant neoplasm of cervix uteri, unspecified: Secondary | ICD-10-CM | POA: Diagnosis not present

## 2023-04-16 DIAGNOSIS — R1115 Cyclical vomiting syndrome unrelated to migraine: Secondary | ICD-10-CM | POA: Diagnosis not present

## 2023-04-16 DIAGNOSIS — E039 Hypothyroidism, unspecified: Secondary | ICD-10-CM | POA: Diagnosis not present

## 2023-04-21 ENCOUNTER — Inpatient Hospital Stay: Payer: BC Managed Care – PPO | Attending: Radiation Oncology

## 2023-04-21 DIAGNOSIS — Z452 Encounter for adjustment and management of vascular access device: Secondary | ICD-10-CM | POA: Insufficient documentation

## 2023-04-21 DIAGNOSIS — Z95828 Presence of other vascular implants and grafts: Secondary | ICD-10-CM

## 2023-04-21 DIAGNOSIS — C539 Malignant neoplasm of cervix uteri, unspecified: Secondary | ICD-10-CM | POA: Insufficient documentation

## 2023-04-21 MED ORDER — HEPARIN SOD (PORK) LOCK FLUSH 100 UNIT/ML IV SOLN
500.0000 [IU] | Freq: Once | INTRAVENOUS | Status: AC
  Administered 2023-04-21: 500 [IU] via INTRAVENOUS
  Filled 2023-04-21: qty 5

## 2023-04-21 MED ORDER — SODIUM CHLORIDE 0.9% FLUSH
10.0000 mL | Freq: Once | INTRAVENOUS | Status: AC
  Administered 2023-04-21: 10 mL via INTRAVENOUS
  Filled 2023-04-21: qty 10

## 2023-05-17 ENCOUNTER — Inpatient Hospital Stay: Payer: BC Managed Care – PPO | Attending: Radiation Oncology

## 2023-05-20 ENCOUNTER — Other Ambulatory Visit: Payer: Self-pay | Admitting: Nurse Practitioner

## 2023-07-12 ENCOUNTER — Inpatient Hospital Stay: Payer: BC Managed Care – PPO | Admitting: Oncology

## 2023-07-12 ENCOUNTER — Inpatient Hospital Stay: Payer: BC Managed Care – PPO

## 2023-07-13 ENCOUNTER — Other Ambulatory Visit

## 2023-07-13 ENCOUNTER — Ambulatory Visit: Admitting: Oncology

## 2023-08-26 ENCOUNTER — Encounter: Payer: Self-pay | Admitting: Oncology

## 2023-08-27 ENCOUNTER — Inpatient Hospital Stay: Attending: Oncology

## 2023-08-27 DIAGNOSIS — Z08 Encounter for follow-up examination after completed treatment for malignant neoplasm: Secondary | ICD-10-CM

## 2023-08-27 DIAGNOSIS — C539 Malignant neoplasm of cervix uteri, unspecified: Secondary | ICD-10-CM | POA: Diagnosis present

## 2023-08-27 LAB — COMPREHENSIVE METABOLIC PANEL WITH GFR
ALT: 17 U/L (ref 0–44)
AST: 21 U/L (ref 15–41)
Albumin: 4 g/dL (ref 3.5–5.0)
Alkaline Phosphatase: 62 U/L (ref 38–126)
Anion gap: 8 (ref 5–15)
BUN: 14 mg/dL (ref 6–20)
CO2: 24 mmol/L (ref 22–32)
Calcium: 8.8 mg/dL — ABNORMAL LOW (ref 8.9–10.3)
Chloride: 104 mmol/L (ref 98–111)
Creatinine, Ser: 0.75 mg/dL (ref 0.44–1.00)
GFR, Estimated: >60 mL/min (ref >60)
Glucose, Bld: 84 mg/dL (ref 70–99)
Potassium: 4 mmol/L (ref 3.5–5.1)
Sodium: 136 mmol/L (ref 135–145)
Total Bilirubin: 0.6 mg/dL (ref 0.0–1.2)
Total Protein: 8 g/dL (ref 6.5–8.1)

## 2023-08-27 LAB — CBC WITH DIFFERENTIAL/PLATELET
Abs Immature Granulocytes: 0.02 10*3/uL (ref 0.00–0.07)
Basophils Absolute: 0.1 10*3/uL (ref 0.0–0.1)
Basophils Relative: 1 %
Eosinophils Absolute: 0.1 10*3/uL (ref 0.0–0.5)
Eosinophils Relative: 2 %
HCT: 41.4 % (ref 36.0–46.0)
Hemoglobin: 14.3 g/dL (ref 12.0–15.0)
Immature Granulocytes: 0 %
Lymphocytes Relative: 44 %
Lymphs Abs: 3.9 10*3/uL (ref 0.7–4.0)
MCH: 32.3 pg (ref 26.0–34.0)
MCHC: 34.5 g/dL (ref 30.0–36.0)
MCV: 93.5 fL (ref 80.0–100.0)
Monocytes Absolute: 0.6 10*3/uL (ref 0.1–1.0)
Monocytes Relative: 7 %
Neutro Abs: 4.1 10*3/uL (ref 1.7–7.7)
Neutrophils Relative %: 46 %
Platelets: 449 10*3/uL — ABNORMAL HIGH (ref 150–400)
RBC: 4.43 MIL/uL (ref 3.87–5.11)
RDW: 12.6 % (ref 11.5–15.5)
WBC: 8.8 10*3/uL (ref 4.0–10.5)
nRBC: 0 % (ref 0.0–0.2)

## 2023-08-27 MED ORDER — SODIUM CHLORIDE 0.9% FLUSH
10.0000 mL | INTRAVENOUS | Status: DC | PRN
  Administered 2023-08-27: 10 mL via INTRAVENOUS
  Filled 2023-08-27: qty 10

## 2023-08-27 MED ORDER — HEPARIN SOD (PORK) LOCK FLUSH 100 UNIT/ML IV SOLN
500.0000 [IU] | Freq: Once | INTRAVENOUS | Status: AC
Start: 2023-08-27 — End: 2023-08-27
  Administered 2023-08-27: 500 [IU] via INTRAVENOUS
  Filled 2023-08-27: qty 5

## 2023-09-22 ENCOUNTER — Inpatient Hospital Stay: Admitting: Oncology

## 2023-09-22 ENCOUNTER — Inpatient Hospital Stay: Attending: Oncology

## 2023-09-22 DIAGNOSIS — Z8541 Personal history of malignant neoplasm of cervix uteri: Secondary | ICD-10-CM | POA: Insufficient documentation

## 2023-09-22 DIAGNOSIS — Z452 Encounter for adjustment and management of vascular access device: Secondary | ICD-10-CM | POA: Insufficient documentation

## 2023-10-04 ENCOUNTER — Inpatient Hospital Stay

## 2023-10-04 ENCOUNTER — Other Ambulatory Visit: Payer: Self-pay | Admitting: Nurse Practitioner

## 2023-10-04 DIAGNOSIS — Z452 Encounter for adjustment and management of vascular access device: Secondary | ICD-10-CM | POA: Diagnosis present

## 2023-10-04 DIAGNOSIS — R110 Nausea: Secondary | ICD-10-CM

## 2023-10-04 DIAGNOSIS — R102 Pelvic and perineal pain: Secondary | ICD-10-CM

## 2023-10-04 DIAGNOSIS — Z8541 Personal history of malignant neoplasm of cervix uteri: Secondary | ICD-10-CM | POA: Diagnosis present

## 2023-10-04 DIAGNOSIS — D500 Iron deficiency anemia secondary to blood loss (chronic): Secondary | ICD-10-CM

## 2023-10-04 MED ORDER — SODIUM CHLORIDE 0.9% FLUSH
10.0000 mL | Freq: Once | INTRAVENOUS | Status: AC | PRN
  Administered 2023-10-04: 10 mL
  Filled 2023-10-04: qty 10

## 2023-10-04 MED ORDER — HEPARIN SOD (PORK) LOCK FLUSH 100 UNIT/ML IV SOLN
500.0000 [IU] | Freq: Once | INTRAVENOUS | Status: AC | PRN
  Administered 2023-10-04: 500 [IU]
  Filled 2023-10-04: qty 5

## 2023-10-07 ENCOUNTER — Inpatient Hospital Stay: Admitting: Oncology

## 2023-10-18 ENCOUNTER — Other Ambulatory Visit: Payer: Self-pay

## 2023-10-18 ENCOUNTER — Emergency Department
Admission: EM | Admit: 2023-10-18 | Discharge: 2023-10-18 | Disposition: A | Discharge status: Home / Self Care | Attending: Emergency Medicine | Admitting: Emergency Medicine

## 2023-10-18 DIAGNOSIS — Z5329 Procedure and treatment not carried out because of patient's decision for other reasons: Secondary | ICD-10-CM | POA: Diagnosis not present

## 2023-10-18 DIAGNOSIS — S01511A Laceration without foreign body of lip, initial encounter: Secondary | ICD-10-CM | POA: Diagnosis not present

## 2023-10-18 DIAGNOSIS — S032XXA Dislocation of tooth, initial encounter: Secondary | ICD-10-CM | POA: Diagnosis not present

## 2023-10-18 DIAGNOSIS — Y92410 Unspecified street and highway as the place of occurrence of the external cause: Secondary | ICD-10-CM | POA: Insufficient documentation

## 2023-10-18 DIAGNOSIS — S00502A Unspecified superficial injury of oral cavity, initial encounter: Secondary | ICD-10-CM | POA: Diagnosis not present

## 2023-10-18 DIAGNOSIS — S0993XA Unspecified injury of face, initial encounter: Secondary | ICD-10-CM

## 2023-10-18 DIAGNOSIS — Y929 Unspecified place or not applicable: Secondary | ICD-10-CM | POA: Diagnosis not present

## 2023-10-18 MED ORDER — LIDOCAINE HCL (PF) 1 % IJ SOLN
5.0000 mL | Freq: Once | INTRAMUSCULAR | Status: AC
  Administered 2023-10-18: 5 mL
  Filled 2023-10-18: qty 5

## 2023-10-18 MED ORDER — CEPHALEXIN 500 MG PO CAPS
500.0000 mg | ORAL_CAPSULE | Freq: Once | ORAL | Status: AC
  Administered 2023-10-18: 500 mg via ORAL
  Filled 2023-10-18: qty 1

## 2023-10-18 MED ORDER — CEPHALEXIN 500 MG PO CAPS
500.0000 mg | ORAL_CAPSULE | Freq: Four times a day (QID) | ORAL | 0 refills | Status: DC
Start: 2023-10-18 — End: 2023-10-18

## 2023-10-18 MED ORDER — CEPHALEXIN 500 MG PO CAPS: 500.0000 mg | ORAL_CAPSULE | Freq: Four times a day (QID) | ORAL | 0 refills | Status: AC

## 2023-10-18 MED ORDER — OXYCODONE-ACETAMINOPHEN 5-325 MG PO TABS
1.0000 | ORAL_TABLET | Freq: Once | ORAL | Status: AC
  Administered 2023-10-18: 1 via ORAL
  Filled 2023-10-18: qty 1
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
## 2023-10-18 NOTE — Discharge Instructions (Signed)
 Please take antibiotics as prescribed.  Cleanse wound daily with antibacterial soap.  You may apply antibiotic ointment to the laceration once daily.  In 2 weeks you can start applying scar cream to the laceration.  Return for any increasing pain swelling warmth redness drainage or fevers.

## 2023-10-26 ENCOUNTER — Other Ambulatory Visit: Payer: Self-pay

## 2023-10-26 ENCOUNTER — Emergency Department
Admission: EM | Admit: 2023-10-26 | Discharge: 2023-10-26 | Disposition: A | Discharge status: Home / Self Care | Attending: Emergency Medicine | Admitting: Emergency Medicine

## 2023-10-26 ENCOUNTER — Encounter: Payer: Self-pay | Admitting: Emergency Medicine

## 2023-10-26 DIAGNOSIS — L089 Local infection of the skin and subcutaneous tissue, unspecified: Secondary | ICD-10-CM | POA: Diagnosis not present

## 2023-10-26 DIAGNOSIS — T8140XA Infection following a procedure, unspecified, initial encounter: Secondary | ICD-10-CM | POA: Insufficient documentation

## 2023-10-26 MED ORDER — CLINDAMYCIN HCL 150 MG PO CAPS: 300.0000 mg | ORAL_CAPSULE | Freq: Three times a day (TID) | ORAL | 0 refills | Status: DC

## 2023-10-26 NOTE — Discharge Instructions (Signed)
 Clean the area with soap and water, do not use hydrogen peroxide Take the antibiotic as prescribed Stop taking septra  Return if the area is worsening or not improving in 3 days Apply warm compress to the area

## 2023-10-26 NOTE — ED Triage Notes (Signed)
 Says had stitches in lip and now they are infected.  Is taking sulfa  antibiotic.  Also says stitches are coming out.
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
## 2023-11-03 DIAGNOSIS — E66813 Obesity, class 3: Secondary | ICD-10-CM | POA: Diagnosis not present

## 2023-11-03 DIAGNOSIS — E063 Autoimmune thyroiditis: Secondary | ICD-10-CM | POA: Diagnosis not present

## 2023-11-03 DIAGNOSIS — C539 Malignant neoplasm of cervix uteri, unspecified: Secondary | ICD-10-CM | POA: Diagnosis not present

## 2023-11-03 DIAGNOSIS — R1115 Cyclical vomiting syndrome unrelated to migraine: Secondary | ICD-10-CM | POA: Diagnosis not present

## 2023-11-03 DIAGNOSIS — F4323 Adjustment disorder with mixed anxiety and depressed mood: Secondary | ICD-10-CM | POA: Diagnosis not present

## 2023-12-03 ENCOUNTER — Inpatient Hospital Stay: Attending: Oncology | Admitting: Nurse Practitioner

## 2023-12-03 ENCOUNTER — Encounter: Payer: Self-pay | Admitting: Nurse Practitioner

## 2023-12-03 ENCOUNTER — Ambulatory Visit: Admitting: Oncology

## 2023-12-10 ENCOUNTER — Telehealth: Payer: Self-pay | Admitting: *Deleted

## 2023-12-10 NOTE — Telephone Encounter (Signed)
 Lauren please see her

## 2023-12-10 NOTE — Telephone Encounter (Signed)
 Called in and said that she wants to come in for port flush and a shot of Toradol  because of her pain in the left abdomen.

## 2023-12-10 NOTE — Telephone Encounter (Signed)
 Lauren said to put her on Monday next week.  In is talking to her now to get the appointment for Monday next week

## 2023-12-13 ENCOUNTER — Inpatient Hospital Stay: Attending: Oncology

## 2023-12-13 ENCOUNTER — Encounter: Payer: Self-pay | Admitting: Nurse Practitioner

## 2023-12-13 ENCOUNTER — Other Ambulatory Visit: Payer: Self-pay

## 2023-12-13 ENCOUNTER — Inpatient Hospital Stay (HOSPITAL_BASED_OUTPATIENT_CLINIC_OR_DEPARTMENT_OTHER): Admitting: Nurse Practitioner

## 2023-12-13 VITALS — BP 116/81 | HR 78 | Temp 98.6°F | Resp 18 | Ht 67.0 in | Wt 216.0 lb

## 2023-12-13 DIAGNOSIS — Z8541 Personal history of malignant neoplasm of cervix uteri: Secondary | ICD-10-CM

## 2023-12-13 DIAGNOSIS — Z08 Encounter for follow-up examination after completed treatment for malignant neoplasm: Secondary | ICD-10-CM | POA: Diagnosis not present

## 2023-12-13 DIAGNOSIS — N939 Abnormal uterine and vaginal bleeding, unspecified: Secondary | ICD-10-CM | POA: Diagnosis not present

## 2023-12-13 DIAGNOSIS — Z9221 Personal history of antineoplastic chemotherapy: Secondary | ICD-10-CM | POA: Diagnosis not present

## 2023-12-13 DIAGNOSIS — R634 Abnormal weight loss: Secondary | ICD-10-CM | POA: Diagnosis not present

## 2023-12-13 DIAGNOSIS — Z923 Personal history of irradiation: Secondary | ICD-10-CM | POA: Insufficient documentation

## 2023-12-13 DIAGNOSIS — N3040 Irradiation cystitis without hematuria: Secondary | ICD-10-CM | POA: Diagnosis not present

## 2023-12-13 DIAGNOSIS — Y842 Radiological procedure and radiotherapy as the cause of abnormal reaction of the patient, or of later complication, without mention of misadventure at the time of the procedure: Secondary | ICD-10-CM | POA: Insufficient documentation

## 2023-12-13 NOTE — Progress Notes (Signed)
 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 Positive pelvic LN s/p concurrent chemoradiation currently in remission   Chief complaint/ Reason for visit- routine follow-up of cervical cancer  Heme/Onc history:  patient is a 38 year old female G0 who initially presented to the ER on 03/15/2018 with symptoms of abdominal pain and cramping.  She has also been having irregular menstrual bleeding and spotting on and off for the last few months.She had an ultrasound pelvis done in the ER which showed a large 5.8 cm hypoechoic vascular mass within the cervix and the lower uterine segment.  Patient had last seen GYN in 2013 and had not had a Pap smear done since then.  Patient was seen by GYN Dr. Barnetta on 1213 and was found to have firm friable cervix and multiple biopsies were taken which showed adenocarcinoma.  She was then seen by Dr. Mancil from GYN oncology.  Pelvic exam showed anteverted cervix that was replaced by tumor and extension of cancer into the left parametrium and possibly to the pelvic sidewall.    PET CT scan on 03/28/2018 showed 6 cm hypermetabolic cervical mass consistent with primary cervical carcinoma.  Mild hypermetabolic bilateral parametrial right perirectal bilateral iliac lymph nodes consistent with metastatic disease.  No evidence of metastatic disease within the abdomen chest or neck.   Cycle 1 of weekly  cisplatin  and radiation started on 04/11/2018.  She completed chemoradiation on 05/19/2018 followed by vaginal brachii therapy.  PET CT scan thereafter did not show any evidence of metastatic disease and interval resolution of hypermetabolic pelvic adenopathy and no hypermetabolism seen in the area of the cervix.   Patient also follows up with urology for issues of pelvic pain urinary frequency.  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 female   patient reports left lower quadrant abdominal pain for the last 2 weeks.  This is not associated with any nausea vomiting or changes in her bowel habits.  Denies any blood in her stools.  Denies any trauma.  Pain has been intermittent without any aggravating or relieving factors  ECOG PS- 0 Pain scale- 4 Opioid associated constipation-no  Review of systems- Review of Systems  Constitutional:  Negative for chills, fever, malaise/fatigue and weight loss.  HENT:  Negative for congestion, ear discharge and nosebleeds.   Eyes:  Negative for blurred vision.  Respiratory:  Negative for cough, hemoptysis, sputum production, shortness of breath and wheezing.   Cardiovascular:  Negative for chest pain, palpitations, orthopnea and claudication.  Gastrointestinal:  Positive for abdominal pain. Negative for blood in stool, constipation, diarrhea, heartburn, melena, nausea and vomiting.  Genitourinary:  Negative for dysuria, flank pain, frequency, hematuria and urgency.  Musculoskeletal:  Negative for back pain, joint pain and myalgias.  Skin:  Negative for rash.  Neurological:  Negative for dizziness, tingling, focal weakness, seizures, weakness and headaches.  Endo/Heme/Allergies:  Does not bruise/bleed easily.  Psychiatric/Behavioral:  Negative  for depression and suicidal ideas. The patient does not have insomnia.     Allergies  Allergen Reactions   Amoxicillin Rash    Did it  involve swelling of the face/tongue/throat, SOB, or low BP? No Did it involve sudden or severe rash/hives, skin peeling, or any reaction on the inside of your mouth or nose? No Did you need to seek medical attention at a hospital or doctor's office? No When did it last happen?  Childhood     If all above answers are NO, may proceed with cephalosporin use.    Penicillins Hives    Did it involve swelling of the face/tongue/throat, SOB, or low BP? No Did it involve sudden or severe rash/hives, skin peeling, or any reaction on the inside of your mouth or nose? No Did you need to seek medical attention at a hospital or doctor's office? No When did it last happen?  Childhood     If all above answers are "NO", may proceed with cephalosporin use.    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   History of cervical cancer    Hypothyroid    Low back pain    Past Surgical History:  Procedure Laterality Date   BUNIONECTOMY Right    PORTA CATH INSERTION N/A 03/31/2018   Procedure: PORTA CATH INSERTION;  Surgeon: Marea Selinda RAMAN, MD;  Location: [facility] INVASIVE CV LAB;  Service: Cardiovascular;  Laterality: N/A;   TONSILLECTOMY     TRANSURETHRAL RESECTION OF BLADDER TUMOR N/A 06/20/2019   Procedure: TRANSURETHRAL RESECTION OF BLADDER TUMOR (TURBT);  Surgeon: Twylla Glendia BROCKS, MD;  Location: [facility] ORS;  Service: Urology;  Laterality: N/A;   Social History   Socioeconomic History   Marital status: Married    Spouse name: Not on file   Number of children: Not on file   Years of education: Not on file   Highest education level: Associate degree: academic program  Occupational History   Not on file  Tobacco Use   Smoking status: Some Days    Current packs/day: 0.15    Average packs/day: 0.2 packs/day for 10.0 years (1.5 ttl pk-yrs)    Types: Cigarettes   Smokeless  tobacco: Never   Tobacco comments:    2 weeks to use a pack  Vaping Use   Vaping status: Some Days   Substances: Mixture of cannabinoids   Devices: marijuana vape  Substance and Sexual Activity   Alcohol use: Yes    Comment: rare   Drug use: Yes    Types: Marijuana    Comment: quit opiates 5 years ago   Sexual activity: Yes    Birth control/protection: None  Other Topics Concern   Not on file  Social History Narrative   Not on file   Social Drivers of Health   Financial Resource Strain: Low Risk  (12/21/2022)   Overall Financial Resource Strain (CARDIA)    Difficulty of Paying Living Expenses: Not very hard  Food Insecurity: No Food Insecurity (12/21/2022)   Hunger Vital Sign    Worried About Running Out of Food in the Last Year: Never true    Ran Out of Food in the Last Year: Never true  Transportation Needs: No Transportation Needs (12/21/2022)   PRAPARE - Administrator, Civil Service (Medical): No  Lack of Transportation (Non-Medical): No  Physical Activity: Insufficiently Active (12/21/2022)   Exercise Vital Sign    Days of Exercise per Week: 2 days    Minutes of Exercise per Session: 30 min  Stress: Stress Concern Present (12/21/2022)   [employer] of Occupational Health - Occupational Stress Questionnaire    Feeling of Stress : Rather much  Social Connections: Unknown (12/21/2022)   Social Connection and Isolation Panel    Frequency of Communication with Friends and Family: More than three times a week    Frequency of Social Gatherings with Friends and Family: Once a week    Attends Religious Services: Patient declined    Database administrator or Organizations: Yes    Attends Engineer, structural: More than 4 times per year    Marital Status: Separated  Intimate Partner Violence: Not on file   Family History  Adopted: Yes  Family history unknown: Yes    Current Outpatient Medications:    ALPRAZolam  (XANAX ) 1 MG tablet, Take 1  tablet (1 mg total) by mouth 3 (three) times daily as needed for anxiety., Disp: 90 tablet, Rfl: 0   clindamycin  (CLEOCIN ) 150 MG capsule, Take 2 capsules (300 mg total) by mouth 3 (three) times daily., Disp: 42 capsule, Rfl: 0   estradiol  (ESTRACE ) 1 MG tablet, TAKE 1 TABLET BY MOUTH EVERY DAY, Disp: 90 tablet, Rfl: 3   fluticasone  (FLONASE ) 50 MCG/ACT nasal spray, Place 2 sprays into both nostrils daily., Disp: 16 g, Rfl: 6   haloperidol  (HALDOL ) 0.5 MG tablet, Take 1 tablet (0.5 mg total) by mouth every 8 (eight) hours as needed (nausea)., Disp: 6 tablet, Rfl: 1   levothyroxine  (SYNTHROID ) 175 MCG tablet, Take 1 tablet (175 mcg total) by mouth daily., Disp: 90 tablet, Rfl: 3   lidocaine -prilocaine  (EMLA ) cream, Apply to affected area once, Disp: 30 g, Rfl: 3   oxybutynin  (DITROPAN  XL) 15 MG 24 hr tablet, TAKE 1 TABLET BY MOUTH ONCE DAILY, Disp: 90 tablet, Rfl: 3   progesterone  (PROMETRIUM ) 200 MG capsule, TAKE ONE CAPSULE BY MOUTH AT BEDTIME WITH FOOD FOR 12 DAYS SEQUENTIALLY PER 28 DAY CYCLE, Disp: 36 capsule, Rfl: 3   promethazine  (PHENERGAN ) 25 MG suppository, Place 1 suppository (25 mg total) rectally every 6 (six) hours as needed for nausea or vomiting., Disp: 12 each, Rfl: 1   promethazine  (PHENERGAN ) 25 MG tablet, Take 1 tablet (25 mg total) by mouth every 8 (eight) hours as needed for nausea or vomiting., Disp: 90 tablet, Rfl: 0   Semaglutide,0.25 or 0.5MG /DOS, (OZEMPIC, 0.25 OR 0.5 MG/DOSE,) 2 MG/1.5ML SOPN, Inject into the skin., Disp: , Rfl:  No current facility-administered medications for this visit.  Facility-Administered Medications Ordered in Other Visits:    sodium chloride  flush (NS) 0.9 % injection 10 mL, 10 mL, Intravenous, PRN, Melanee Annah BROCKS, MD, 10 mL at 04/15/18 0825  Physical Exam:  Vitals:   12/13/23 1452  BP: 116/81  Pulse: 78  Resp: 18  Temp: 98.6 F (37 C)  TempSrc: Tympanic  Weight: 216 lb (98 kg)  Height: 5' 7 (1.702 m)   Physical Exam Cardiovascular:      Rate and Rhythm: Normal rate and regular rhythm.     Heart sounds: Normal heart sounds.  Pulmonary:     Effort: Pulmonary effort is normal.     Breath sounds: Normal breath sounds.  Abdominal:     General: Bowel sounds are normal. There is no distension.     Palpations: Abdomen  is soft.     Comments: Mild tenderness to palpation in the left lower quadrant  Skin:    General: Skin is warm and dry.  Neurological:     Mental Status: She is alert and oriented to person, place, and time.        Latest Ref Rng & Units 08/27/2023    2:11 PM  CMP  Glucose 70 - 99 mg/dL 84   BUN 6 - 20 mg/dL 14   Creatinine 9.55 - 1.00 mg/dL 9.24   Sodium 864 - 854 mmol/L 136   Potassium 3.5 - 5.1 mmol/L 4.0   Chloride 98 - 111 mmol/L 104   CO2 22 - 32 mmol/L 24   Calcium 8.9 - 10.3 mg/dL 8.8   Total Protein 6.5 - 8.1 g/dL 8.0   Total Bilirubin 0.0 - 1.2 mg/dL 0.6   Alkaline Phos 38 - 126 U/L 62   AST 15 - 41 U/L 21   ALT 0 - 44 U/L 17       Latest Ref Rng & Units 08/27/2023    2:11 PM  CBC  WBC 4.0 - 10.5 K/uL 8.8   Hemoglobin 12.0 - 15.0 g/dL 85.6   Hematocrit 63.9 - 46.0 % 41.4   Platelets 150 - 400 K/uL 449     Assessment and plan- Patient is a 38 y.o. female who returns to clinic for follow up of:   Stage IIIC Adenocarcinoma of the cervix - T2 N1 M0 s/p concurrent chemoradiation with vaginal brachytherapy completed 06/07/2018. NED since. She is here for routine follow-up. Poor compliance with surveillance recommendations. Today, left lower quadrant pain and bleeding - hematocolpos vs recurrent disease vs radiation? Recommend seeing gyn onc with pelvic exam, with imaging prior. Patient in agreement. Updated Dr Melanee.  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 Stable.  Premature menopause & abnormal vaginal bleeding- due to radiation. Prescribed estrace  1 mg tablet with prometrium  200 mg capsule 12 days a month however she has not been taking prometrium  for several months. Uterus in situ. Continue  estrace  and need to restart prometrium .  Unintentional weight loss- previously on semaglutide but discontinued in February 2025 with ongoing weight loss. Imaging as above.   Disposition:  CT C/A/P asap See gyn onc after for results and pelvic exam 6 mo- see Dr Melanee for surveillance- la    Visit Diagnosis 1. Encounter for follow-up surveillance of cervical cancer   2. Unintentional weight loss   3. Abnormal vaginal bleeding    Tinnie Dawn, DNP, AGNP-C, [facility] [phone] (clinic) 12/13/2023

## 2023-12-14 ENCOUNTER — Ambulatory Visit
Admission: RE | Admit: 2023-12-14 | Discharge: 2023-12-14 | Disposition: A | Discharge status: Home / Self Care | Source: Ambulatory Visit | Attending: Nurse Practitioner | Admitting: Nurse Practitioner

## 2023-12-14 DIAGNOSIS — Z08 Encounter for follow-up examination after completed treatment for malignant neoplasm: Secondary | ICD-10-CM | POA: Insufficient documentation

## 2023-12-14 DIAGNOSIS — R634 Abnormal weight loss: Secondary | ICD-10-CM | POA: Diagnosis not present

## 2023-12-14 DIAGNOSIS — Z8541 Personal history of malignant neoplasm of cervix uteri: Secondary | ICD-10-CM | POA: Diagnosis not present

## 2023-12-14 DIAGNOSIS — R1032 Left lower quadrant pain: Secondary | ICD-10-CM | POA: Diagnosis not present

## 2023-12-14 DIAGNOSIS — R160 Hepatomegaly, not elsewhere classified: Secondary | ICD-10-CM | POA: Diagnosis not present

## 2023-12-14 MED ORDER — IOHEXOL 300 MG/ML  SOLN
100.0000 mL | Freq: Once | INTRAMUSCULAR | Status: AC | PRN
  Administered 2023-12-14: 100 mL via INTRAVENOUS

## 2023-12-14 MED ORDER — IOHEXOL 9 MG/ML PO SOLN
500.0000 mL | ORAL | Status: AC
  Administered 2023-12-14: 500 mL via ORAL

## 2023-12-14 MED ORDER — HEPARIN SOD (PORK) LOCK FLUSH 100 UNIT/ML IV SOLN
500.0000 [IU] | Freq: Once | INTRAVENOUS | Status: AC
  Administered 2023-12-14: 500 [IU] via INTRAVENOUS

## 2023-12-15 ENCOUNTER — Inpatient Hospital Stay (HOSPITAL_BASED_OUTPATIENT_CLINIC_OR_DEPARTMENT_OTHER): Admitting: Obstetrics and Gynecology

## 2023-12-15 DIAGNOSIS — Z8541 Personal history of malignant neoplasm of cervix uteri: Secondary | ICD-10-CM

## 2023-12-15 DIAGNOSIS — Z08 Encounter for follow-up examination after completed treatment for malignant neoplasm: Secondary | ICD-10-CM

## 2023-12-15 NOTE — Progress Notes (Deleted)
 No show Kristy Renn [name], MD

## 2023-12-17 ENCOUNTER — Other Ambulatory Visit

## 2023-12-17 ENCOUNTER — Ambulatory Visit: Admitting: Nurse Practitioner

## 2023-12-29 ENCOUNTER — Encounter: Payer: Self-pay | Admitting: Oncology

## 2023-12-29 NOTE — Progress Notes (Signed)
 Patient did not show for clinic appointment.  Kristy Kiely [name], MD

## 2024-02-10 DIAGNOSIS — Z1159 Encounter for screening for other viral diseases: Secondary | ICD-10-CM | POA: Diagnosis not present

## 2024-05-10 ENCOUNTER — Encounter: Payer: Self-pay | Admitting: Oncology

## 2024-06-12 ENCOUNTER — Inpatient Hospital Stay: Admitting: Oncology

## 2024-06-19 ENCOUNTER — Ambulatory Visit: Admitting: Nurse Practitioner
